# Patient Record
Sex: Male | Born: 1935 | Race: White | Hispanic: No | Smoking: Former smoker
Health system: Southern US, Community
[De-identification: ages and names within clinical notes are randomized; demographics above are authoritative.]

## PROBLEM LIST (undated history)

## (undated) DIAGNOSIS — E785 Hyperlipidemia, unspecified: Secondary | ICD-10-CM

## (undated) DIAGNOSIS — R0600 Dyspnea, unspecified: Secondary | ICD-10-CM

## (undated) DIAGNOSIS — I482 Chronic atrial fibrillation, unspecified: Secondary | ICD-10-CM

## (undated) DIAGNOSIS — C3490 Malignant neoplasm of unspecified part of unspecified bronchus or lung: Secondary | ICD-10-CM

## (undated) DIAGNOSIS — J449 Chronic obstructive pulmonary disease, unspecified: Secondary | ICD-10-CM

## (undated) DIAGNOSIS — I779 Disorder of arteries and arterioles, unspecified: Secondary | ICD-10-CM

## (undated) DIAGNOSIS — R0989 Other specified symptoms and signs involving the circulatory and respiratory systems: Secondary | ICD-10-CM

## (undated) DIAGNOSIS — I10 Essential (primary) hypertension: Secondary | ICD-10-CM

## (undated) DIAGNOSIS — I739 Peripheral vascular disease, unspecified: Secondary | ICD-10-CM

## (undated) DIAGNOSIS — M1990 Unspecified osteoarthritis, unspecified site: Secondary | ICD-10-CM

## (undated) DIAGNOSIS — N400 Enlarged prostate without lower urinary tract symptoms: Secondary | ICD-10-CM

## (undated) DIAGNOSIS — E119 Type 2 diabetes mellitus without complications: Secondary | ICD-10-CM

## (undated) DIAGNOSIS — G459 Transient cerebral ischemic attack, unspecified: Secondary | ICD-10-CM

## (undated) HISTORY — PX: SPINE SURGERY: SHX786

## (undated) HISTORY — DX: Benign prostatic hyperplasia without lower urinary tract symptoms: N40.0

## (undated) HISTORY — DX: Type 2 diabetes mellitus without complications: E11.9

## (undated) HISTORY — DX: Chronic atrial fibrillation, unspecified: I48.20

## (undated) HISTORY — DX: Essential (primary) hypertension: I10

## (undated) HISTORY — PX: CATARACT EXTRACTION: SUR2

## (undated) HISTORY — PX: BACK SURGERY: SHX140

## (undated) HISTORY — PX: TOTAL KNEE ARTHROPLASTY: SHX125

## (undated) HISTORY — DX: Disorder of arteries and arterioles, unspecified: I77.9

## (undated) HISTORY — PX: EYE SURGERY: SHX253

## (undated) HISTORY — DX: Hyperlipidemia, unspecified: E78.5

## (undated) HISTORY — PX: TONSILLECTOMY: SUR1361

## (undated) HISTORY — DX: Unspecified osteoarthritis, unspecified site: M19.90

## (undated) HISTORY — DX: Other specified symptoms and signs involving the circulatory and respiratory systems: R09.89

## (undated) HISTORY — PX: JOINT REPLACEMENT: SHX530

## (undated) HISTORY — PX: RETINAL DETACHMENT SURGERY: SHX105

## (undated) HISTORY — PX: NOSE SURGERY: SHX723

## (undated) HISTORY — DX: Malignant neoplasm of unspecified part of unspecified bronchus or lung: C34.90

## (undated) HISTORY — DX: Peripheral vascular disease, unspecified: I73.9

---

## 1968-07-20 HISTORY — PX: HEMORRHOID SURGERY: SHX153

## 2000-07-20 ENCOUNTER — Encounter: Payer: Self-pay | Admitting: Family Medicine

## 2001-02-10 ENCOUNTER — Encounter: Payer: Self-pay | Admitting: Family Medicine

## 2001-02-10 ENCOUNTER — Encounter: Admission: RE | Admit: 2001-02-10 | Discharge: 2001-02-10 | Payer: Self-pay | Admitting: Family Medicine

## 2001-02-21 ENCOUNTER — Encounter (INDEPENDENT_AMBULATORY_CARE_PROVIDER_SITE_OTHER): Payer: Self-pay | Admitting: *Deleted

## 2001-02-21 ENCOUNTER — Ambulatory Visit (HOSPITAL_COMMUNITY): Admission: RE | Admit: 2001-02-21 | Discharge: 2001-02-21 | Payer: Self-pay | Admitting: Pulmonary Disease

## 2001-02-28 ENCOUNTER — Ambulatory Visit (HOSPITAL_COMMUNITY): Admission: RE | Admit: 2001-02-28 | Discharge: 2001-02-28 | Payer: Self-pay | Admitting: Critical Care Medicine

## 2001-02-28 ENCOUNTER — Encounter: Payer: Self-pay | Admitting: Critical Care Medicine

## 2001-03-15 ENCOUNTER — Encounter (INDEPENDENT_AMBULATORY_CARE_PROVIDER_SITE_OTHER): Payer: Self-pay | Admitting: *Deleted

## 2001-03-15 ENCOUNTER — Inpatient Hospital Stay (HOSPITAL_COMMUNITY)
Admission: RE | Admit: 2001-03-15 | Discharge: 2001-03-19 | Payer: Self-pay | Admitting: Thoracic Surgery (Cardiothoracic Vascular Surgery)

## 2001-03-15 ENCOUNTER — Encounter: Payer: Self-pay | Admitting: Thoracic Surgery (Cardiothoracic Vascular Surgery)

## 2001-03-15 DIAGNOSIS — C3490 Malignant neoplasm of unspecified part of unspecified bronchus or lung: Secondary | ICD-10-CM

## 2001-03-15 HISTORY — PX: LOBECTOMY: SHX5089

## 2001-03-15 HISTORY — PX: WEDGE RESECTION: SHX5070

## 2001-03-16 ENCOUNTER — Encounter: Payer: Self-pay | Admitting: Thoracic Surgery (Cardiothoracic Vascular Surgery)

## 2001-03-17 ENCOUNTER — Encounter: Payer: Self-pay | Admitting: Thoracic Surgery (Cardiothoracic Vascular Surgery)

## 2001-03-18 ENCOUNTER — Encounter: Payer: Self-pay | Admitting: Thoracic Surgery (Cardiothoracic Vascular Surgery)

## 2001-03-30 ENCOUNTER — Encounter
Admission: RE | Admit: 2001-03-30 | Discharge: 2001-03-30 | Payer: Self-pay | Admitting: Thoracic Surgery (Cardiothoracic Vascular Surgery)

## 2001-03-30 ENCOUNTER — Encounter: Payer: Self-pay | Admitting: Thoracic Surgery (Cardiothoracic Vascular Surgery)

## 2001-05-02 ENCOUNTER — Ambulatory Visit (HOSPITAL_COMMUNITY): Admission: RE | Admit: 2001-05-02 | Discharge: 2001-05-02 | Payer: Self-pay | Admitting: Oncology

## 2001-05-02 ENCOUNTER — Encounter (HOSPITAL_COMMUNITY): Payer: Self-pay | Admitting: Oncology

## 2001-05-24 ENCOUNTER — Ambulatory Visit (HOSPITAL_COMMUNITY): Admission: RE | Admit: 2001-05-24 | Discharge: 2001-05-24 | Payer: Self-pay | Admitting: Oncology

## 2001-05-24 ENCOUNTER — Encounter (HOSPITAL_COMMUNITY): Payer: Self-pay | Admitting: Oncology

## 2001-06-29 ENCOUNTER — Encounter
Admission: RE | Admit: 2001-06-29 | Discharge: 2001-06-29 | Payer: Self-pay | Admitting: Thoracic Surgery (Cardiothoracic Vascular Surgery)

## 2001-06-29 ENCOUNTER — Encounter: Payer: Self-pay | Admitting: Thoracic Surgery (Cardiothoracic Vascular Surgery)

## 2001-08-20 ENCOUNTER — Encounter: Payer: Self-pay | Admitting: Family Medicine

## 2001-10-05 ENCOUNTER — Encounter
Admission: RE | Admit: 2001-10-05 | Discharge: 2001-10-05 | Payer: Self-pay | Admitting: Thoracic Surgery (Cardiothoracic Vascular Surgery)

## 2001-10-05 ENCOUNTER — Encounter: Payer: Self-pay | Admitting: Thoracic Surgery (Cardiothoracic Vascular Surgery)

## 2002-03-01 ENCOUNTER — Encounter (HOSPITAL_COMMUNITY): Payer: Self-pay | Admitting: Oncology

## 2002-03-01 ENCOUNTER — Ambulatory Visit (HOSPITAL_COMMUNITY): Admission: RE | Admit: 2002-03-01 | Discharge: 2002-03-01 | Payer: Self-pay | Admitting: Oncology

## 2002-08-20 ENCOUNTER — Encounter: Payer: Self-pay | Admitting: Family Medicine

## 2002-10-05 ENCOUNTER — Ambulatory Visit (HOSPITAL_COMMUNITY): Admission: RE | Admit: 2002-10-05 | Discharge: 2002-10-05 | Payer: Self-pay | Admitting: Oncology

## 2002-10-05 ENCOUNTER — Encounter (HOSPITAL_COMMUNITY): Payer: Self-pay | Admitting: Oncology

## 2003-02-20 ENCOUNTER — Encounter (HOSPITAL_COMMUNITY): Payer: Self-pay | Admitting: Oncology

## 2003-02-20 ENCOUNTER — Ambulatory Visit (HOSPITAL_COMMUNITY): Admission: RE | Admit: 2003-02-20 | Discharge: 2003-02-20 | Payer: Self-pay | Admitting: Oncology

## 2003-08-21 ENCOUNTER — Encounter: Payer: Self-pay | Admitting: Family Medicine

## 2003-08-21 LAB — CONVERTED CEMR LAB: PSA: 0.9 ng/mL

## 2003-10-15 ENCOUNTER — Ambulatory Visit (HOSPITAL_COMMUNITY): Admission: RE | Admit: 2003-10-15 | Discharge: 2003-10-15 | Payer: Self-pay | Admitting: Oncology

## 2004-02-18 ENCOUNTER — Encounter: Payer: Self-pay | Admitting: Family Medicine

## 2004-03-06 ENCOUNTER — Ambulatory Visit (HOSPITAL_COMMUNITY): Admission: RE | Admit: 2004-03-06 | Discharge: 2004-03-06 | Payer: Self-pay | Admitting: Oncology

## 2004-05-30 ENCOUNTER — Ambulatory Visit: Payer: Self-pay | Admitting: Family Medicine

## 2004-06-09 ENCOUNTER — Ambulatory Visit (HOSPITAL_COMMUNITY): Admission: RE | Admit: 2004-06-09 | Discharge: 2004-06-09 | Payer: Self-pay | Admitting: Oncology

## 2004-06-24 ENCOUNTER — Ambulatory Visit: Payer: Self-pay | Admitting: Family Medicine

## 2004-06-27 ENCOUNTER — Ambulatory Visit: Payer: Self-pay | Admitting: Oncology

## 2004-07-20 ENCOUNTER — Encounter: Payer: Self-pay | Admitting: Family Medicine

## 2004-07-20 LAB — CONVERTED CEMR LAB: PSA: 1.17 ng/mL

## 2004-08-05 ENCOUNTER — Ambulatory Visit: Payer: Self-pay | Admitting: Family Medicine

## 2004-08-19 ENCOUNTER — Ambulatory Visit: Payer: Self-pay | Admitting: Family Medicine

## 2004-08-20 ENCOUNTER — Encounter: Payer: Self-pay | Admitting: Family Medicine

## 2004-08-20 LAB — CONVERTED CEMR LAB: Hgb A1c MFr Bld: 6.5 %

## 2004-08-21 ENCOUNTER — Ambulatory Visit: Payer: Self-pay | Admitting: Family Medicine

## 2004-08-26 ENCOUNTER — Ambulatory Visit: Payer: Self-pay | Admitting: Family Medicine

## 2004-08-27 ENCOUNTER — Ambulatory Visit: Payer: Self-pay | Admitting: Oncology

## 2004-12-01 ENCOUNTER — Ambulatory Visit: Payer: Self-pay | Admitting: Family Medicine

## 2004-12-18 ENCOUNTER — Encounter: Payer: Self-pay | Admitting: Family Medicine

## 2005-01-07 ENCOUNTER — Ambulatory Visit: Payer: Self-pay | Admitting: Family Medicine

## 2005-01-14 ENCOUNTER — Ambulatory Visit: Payer: Self-pay | Admitting: Family Medicine

## 2005-02-16 ENCOUNTER — Ambulatory Visit: Payer: Self-pay | Admitting: Oncology

## 2005-02-19 ENCOUNTER — Ambulatory Visit (HOSPITAL_COMMUNITY): Admission: RE | Admit: 2005-02-19 | Discharge: 2005-02-19 | Payer: Self-pay | Admitting: Oncology

## 2005-04-09 ENCOUNTER — Ambulatory Visit: Payer: Self-pay | Admitting: Family Medicine

## 2005-04-14 ENCOUNTER — Ambulatory Visit: Payer: Self-pay | Admitting: Family Medicine

## 2005-05-15 ENCOUNTER — Ambulatory Visit: Payer: Self-pay | Admitting: Family Medicine

## 2005-06-19 ENCOUNTER — Encounter: Payer: Self-pay | Admitting: Family Medicine

## 2005-07-15 ENCOUNTER — Ambulatory Visit: Payer: Self-pay | Admitting: Family Medicine

## 2005-07-17 ENCOUNTER — Ambulatory Visit: Payer: Self-pay | Admitting: Family Medicine

## 2005-08-26 ENCOUNTER — Ambulatory Visit: Payer: Self-pay | Admitting: Oncology

## 2005-08-27 ENCOUNTER — Ambulatory Visit (HOSPITAL_COMMUNITY): Admission: RE | Admit: 2005-08-27 | Discharge: 2005-08-27 | Payer: Self-pay | Admitting: Oncology

## 2005-09-01 ENCOUNTER — Ambulatory Visit: Payer: Self-pay | Admitting: Gastroenterology

## 2005-09-16 ENCOUNTER — Ambulatory Visit: Payer: Self-pay | Admitting: Gastroenterology

## 2005-09-17 ENCOUNTER — Encounter: Payer: Self-pay | Admitting: Family Medicine

## 2005-09-17 LAB — CONVERTED CEMR LAB
Hgb A1c MFr Bld: 6 %
PSA: 1.43 ng/mL

## 2005-09-30 ENCOUNTER — Ambulatory Visit: Payer: Self-pay | Admitting: Family Medicine

## 2005-10-02 ENCOUNTER — Ambulatory Visit: Payer: Self-pay | Admitting: Family Medicine

## 2006-01-14 ENCOUNTER — Ambulatory Visit: Payer: Self-pay | Admitting: Family Medicine

## 2006-02-23 ENCOUNTER — Ambulatory Visit: Payer: Self-pay | Admitting: Internal Medicine

## 2006-03-05 ENCOUNTER — Ambulatory Visit: Payer: Self-pay | Admitting: Oncology

## 2006-03-05 ENCOUNTER — Observation Stay (HOSPITAL_COMMUNITY): Admission: RE | Admit: 2006-03-05 | Discharge: 2006-03-06 | Payer: Self-pay | Admitting: Neurosurgery

## 2006-03-11 LAB — COMPREHENSIVE METABOLIC PANEL
ALT: 27 U/L (ref 0–40)
AST: 16 U/L (ref 0–37)
Chloride: 101 mEq/L (ref 96–112)
Creatinine, Ser: 1 mg/dL (ref 0.40–1.50)
Sodium: 139 mEq/L (ref 135–145)
Total Bilirubin: 0.6 mg/dL (ref 0.3–1.2)
Total Protein: 6.2 g/dL (ref 6.0–8.3)

## 2006-03-11 LAB — CBC WITH DIFFERENTIAL/PLATELET
BASO%: 0.3 % (ref 0.0–2.0)
EOS%: 1.7 % (ref 0.0–7.0)
HCT: 42.1 % (ref 38.7–49.9)
LYMPH%: 22.4 % (ref 14.0–48.0)
MCH: 30 pg (ref 28.0–33.4)
MCHC: 33.7 g/dL (ref 32.0–35.9)
MONO#: 0.8 10*3/uL (ref 0.1–0.9)
NEUT%: 67.6 % (ref 40.0–75.0)
RBC: 4.73 10*6/uL (ref 4.20–5.71)
WBC: 10.2 10*3/uL — ABNORMAL HIGH (ref 4.0–10.0)
lymph#: 2.3 10*3/uL (ref 0.9–3.3)

## 2006-04-01 ENCOUNTER — Ambulatory Visit: Payer: Self-pay | Admitting: Family Medicine

## 2006-04-05 ENCOUNTER — Ambulatory Visit: Payer: Self-pay | Admitting: Family Medicine

## 2006-04-27 ENCOUNTER — Ambulatory Visit: Payer: Self-pay | Admitting: Family Medicine

## 2006-08-17 ENCOUNTER — Ambulatory Visit: Payer: Self-pay | Admitting: Family Medicine

## 2006-08-31 ENCOUNTER — Ambulatory Visit: Payer: Self-pay | Admitting: Family Medicine

## 2006-09-06 ENCOUNTER — Ambulatory Visit: Payer: Self-pay | Admitting: Oncology

## 2006-09-09 ENCOUNTER — Ambulatory Visit (HOSPITAL_COMMUNITY): Admission: RE | Admit: 2006-09-09 | Discharge: 2006-09-09 | Payer: Self-pay | Admitting: Oncology

## 2006-09-09 LAB — COMPREHENSIVE METABOLIC PANEL
ALT: 30 U/L (ref 0–53)
BUN: 15 mg/dL (ref 6–23)
CO2: 25 mEq/L (ref 19–32)
Creatinine, Ser: 0.96 mg/dL (ref 0.40–1.50)
Glucose, Bld: 152 mg/dL — ABNORMAL HIGH (ref 70–99)
Total Bilirubin: 0.6 mg/dL (ref 0.3–1.2)

## 2006-09-09 LAB — CBC WITH DIFFERENTIAL/PLATELET
Basophils Absolute: 0 10*3/uL (ref 0.0–0.1)
EOS%: 1.7 % (ref 0.0–7.0)
HCT: 44.2 % (ref 38.7–49.9)
HGB: 15.4 g/dL (ref 13.0–17.1)
LYMPH%: 26.6 % (ref 14.0–48.0)
MCH: 29.5 pg (ref 28.0–33.4)
MCHC: 34.8 g/dL (ref 32.0–35.9)
MCV: 84.9 fL (ref 81.6–98.0)
MONO%: 7.9 % (ref 0.0–13.0)
NEUT%: 63.3 % (ref 40.0–75.0)

## 2006-09-09 LAB — LACTATE DEHYDROGENASE: LDH: 118 U/L (ref 94–250)

## 2006-09-10 ENCOUNTER — Ambulatory Visit (HOSPITAL_COMMUNITY): Admission: RE | Admit: 2006-09-10 | Discharge: 2006-09-10 | Payer: Self-pay | Admitting: Oncology

## 2006-10-04 ENCOUNTER — Ambulatory Visit: Payer: Self-pay | Admitting: Family Medicine

## 2006-10-04 LAB — CONVERTED CEMR LAB
ALT: 32 units/L (ref 0–40)
Albumin: 3.9 g/dL (ref 3.5–5.2)
BUN: 13 mg/dL (ref 6–23)
Basophils Absolute: 0 10*3/uL (ref 0.0–0.1)
Chloride: 102 meq/L (ref 96–112)
Cholesterol: 160 mg/dL (ref 0–200)
Creatinine, Ser: 1 mg/dL (ref 0.4–1.5)
Eosinophils Absolute: 0.1 10*3/uL (ref 0.0–0.6)
Eosinophils Relative: 1.5 % (ref 0.0–5.0)
GFR calc non Af Amer: 78 mL/min
Glucose, Bld: 118 mg/dL — ABNORMAL HIGH (ref 70–99)
HCT: 44.6 % (ref 39.0–52.0)
Hemoglobin: 15 g/dL (ref 13.0–17.0)
Lymphocytes Relative: 23.3 % (ref 12.0–46.0)
MCV: 87.8 fL (ref 78.0–100.0)
Monocytes Absolute: 0.6 10*3/uL (ref 0.2–0.7)
Neutrophils Relative %: 67.1 % (ref 43.0–77.0)
Platelets: 228 10*3/uL (ref 150–400)
Potassium: 4.5 meq/L (ref 3.5–5.1)
Sodium: 139 meq/L (ref 135–145)
Triglycerides: 138 mg/dL (ref 0–149)

## 2006-10-07 ENCOUNTER — Ambulatory Visit: Payer: Self-pay | Admitting: Family Medicine

## 2006-10-14 ENCOUNTER — Ambulatory Visit: Payer: Self-pay | Admitting: Family Medicine

## 2007-03-06 ENCOUNTER — Ambulatory Visit: Payer: Self-pay | Admitting: Oncology

## 2007-03-10 ENCOUNTER — Encounter: Payer: Self-pay | Admitting: Family Medicine

## 2007-03-10 LAB — CBC WITH DIFFERENTIAL/PLATELET
Basophils Absolute: 0 10*3/uL (ref 0.0–0.1)
Eosinophils Absolute: 0.2 10*3/uL (ref 0.0–0.5)
HCT: 42.3 % (ref 38.7–49.9)
HGB: 14.7 g/dL (ref 13.0–17.1)
MONO#: 0.7 10*3/uL (ref 0.1–0.9)
NEUT#: 3.5 10*3/uL (ref 1.5–6.5)
NEUT%: 54.5 % (ref 40.0–75.0)
RDW: 13.6 % (ref 11.2–14.6)
WBC: 6.5 10*3/uL (ref 4.0–10.0)
lymph#: 2 10*3/uL (ref 0.9–3.3)

## 2007-03-10 LAB — COMPREHENSIVE METABOLIC PANEL
Albumin: 4.2 g/dL (ref 3.5–5.2)
BUN: 15 mg/dL (ref 6–23)
CO2: 22 mEq/L (ref 19–32)
Calcium: 9.1 mg/dL (ref 8.4–10.5)
Chloride: 106 mEq/L (ref 96–112)
Creatinine, Ser: 0.8 mg/dL (ref 0.40–1.50)
Potassium: 4.4 mEq/L (ref 3.5–5.3)

## 2007-03-10 LAB — LACTATE DEHYDROGENASE: LDH: 138 U/L (ref 94–250)

## 2007-03-17 ENCOUNTER — Encounter: Payer: Self-pay | Admitting: Family Medicine

## 2007-03-17 DIAGNOSIS — N400 Enlarged prostate without lower urinary tract symptoms: Secondary | ICD-10-CM

## 2007-03-17 DIAGNOSIS — E1139 Type 2 diabetes mellitus with other diabetic ophthalmic complication: Secondary | ICD-10-CM | POA: Insufficient documentation

## 2007-03-17 DIAGNOSIS — K649 Unspecified hemorrhoids: Secondary | ICD-10-CM | POA: Insufficient documentation

## 2007-03-17 DIAGNOSIS — I10 Essential (primary) hypertension: Secondary | ICD-10-CM | POA: Insufficient documentation

## 2007-03-17 DIAGNOSIS — M1710 Unilateral primary osteoarthritis, unspecified knee: Secondary | ICD-10-CM

## 2007-03-29 ENCOUNTER — Ambulatory Visit: Payer: Self-pay | Admitting: Family Medicine

## 2007-03-29 LAB — CONVERTED CEMR LAB: Hgb A1c MFr Bld: 6.6 % — ABNORMAL HIGH (ref 4.6–6.0)

## 2007-03-31 ENCOUNTER — Ambulatory Visit: Payer: Self-pay | Admitting: Family Medicine

## 2007-04-20 ENCOUNTER — Ambulatory Visit: Payer: Self-pay | Admitting: Family Medicine

## 2007-09-08 ENCOUNTER — Ambulatory Visit: Payer: Self-pay | Admitting: Oncology

## 2007-09-12 ENCOUNTER — Ambulatory Visit (HOSPITAL_COMMUNITY): Admission: RE | Admit: 2007-09-12 | Discharge: 2007-09-12 | Payer: Self-pay | Admitting: Oncology

## 2007-09-12 ENCOUNTER — Encounter: Payer: Self-pay | Admitting: Family Medicine

## 2007-09-12 LAB — CBC WITH DIFFERENTIAL/PLATELET
Eosinophils Absolute: 0.1 10*3/uL (ref 0.0–0.5)
HCT: 43.7 % (ref 38.7–49.9)
HGB: 15.1 g/dL (ref 13.0–17.1)
LYMPH%: 24.4 % (ref 14.0–48.0)
MONO#: 0.5 10*3/uL (ref 0.1–0.9)
NEUT#: 4.1 10*3/uL (ref 1.5–6.5)
NEUT%: 65.2 % (ref 40.0–75.0)
Platelets: 207 10*3/uL (ref 145–400)
WBC: 6.3 10*3/uL (ref 4.0–10.0)
lymph#: 1.5 10*3/uL (ref 0.9–3.3)

## 2007-09-12 LAB — COMPREHENSIVE METABOLIC PANEL
ALT: 53 U/L (ref 0–53)
Albumin: 4.4 g/dL (ref 3.5–5.2)
CO2: 25 mEq/L (ref 19–32)
Chloride: 105 mEq/L (ref 96–112)
Glucose, Bld: 185 mg/dL — ABNORMAL HIGH (ref 70–99)
Potassium: 4.2 mEq/L (ref 3.5–5.3)
Sodium: 142 mEq/L (ref 135–145)
Total Bilirubin: 0.4 mg/dL (ref 0.3–1.2)
Total Protein: 6.8 g/dL (ref 6.0–8.3)

## 2007-09-12 LAB — LACTATE DEHYDROGENASE: LDH: 140 U/L (ref 94–250)

## 2007-10-07 ENCOUNTER — Ambulatory Visit: Payer: Self-pay | Admitting: Family Medicine

## 2007-10-07 LAB — CONVERTED CEMR LAB
ALT: 54 units/L — ABNORMAL HIGH (ref 0–53)
AST: 37 units/L (ref 0–37)
Albumin: 3.9 g/dL (ref 3.5–5.2)
Calcium: 9.6 mg/dL (ref 8.4–10.5)
Chloride: 104 meq/L (ref 96–112)
Creatinine, Ser: 1.1 mg/dL (ref 0.4–1.5)
Eosinophils Absolute: 0.1 10*3/uL (ref 0.0–0.6)
Eosinophils Relative: 2 % (ref 0.0–5.0)
GFR calc Af Amer: 85 mL/min
Glucose, Bld: 126 mg/dL — ABNORMAL HIGH (ref 70–99)
HDL: 46.2 mg/dL (ref >39.0)
Lymphocytes Relative: 22.9 % (ref 12.0–46.0)
MCHC: 32.1 g/dL (ref 30.0–36.0)
MCV: 89.9 fL (ref 78.0–100.0)
Monocytes Absolute: 0.5 10*3/uL (ref 0.2–0.7)
Neutro Abs: 4.1 10*3/uL (ref 1.4–7.7)
PSA: 0.56 ng/mL (ref 0.10–4.00)
Potassium: 4.5 meq/L (ref 3.5–5.1)
RDW: 12.8 % (ref 11.5–14.6)
TSH: 1.09 microintl units/mL (ref 0.35–5.50)
Total CHOL/HDL Ratio: 3.3
Total Protein: 6.4 g/dL (ref 6.0–8.3)
VLDL: 16 mg/dL (ref 0–40)
WBC: 6.1 10*3/uL (ref 4.5–10.5)

## 2007-10-11 ENCOUNTER — Ambulatory Visit: Payer: Self-pay | Admitting: Family Medicine

## 2007-10-21 ENCOUNTER — Ambulatory Visit: Payer: Self-pay | Admitting: Family Medicine

## 2007-10-21 LAB — FECAL OCCULT BLOOD, GUAIAC: Fecal Occult Blood: NEGATIVE

## 2007-10-21 LAB — CONVERTED CEMR LAB
OCCULT 1: NEGATIVE
OCCULT 2: NEGATIVE
OCCULT 3: NEGATIVE

## 2007-10-24 ENCOUNTER — Encounter (INDEPENDENT_AMBULATORY_CARE_PROVIDER_SITE_OTHER): Payer: Self-pay | Admitting: *Deleted

## 2008-01-17 ENCOUNTER — Encounter: Payer: Self-pay | Admitting: Family Medicine

## 2008-02-14 ENCOUNTER — Encounter: Payer: Self-pay | Admitting: Family Medicine

## 2008-02-23 ENCOUNTER — Ambulatory Visit: Payer: Self-pay | Admitting: Family Medicine

## 2008-02-28 ENCOUNTER — Ambulatory Visit: Payer: Self-pay | Admitting: Cardiovascular Disease

## 2008-02-29 ENCOUNTER — Ambulatory Visit: Payer: Self-pay

## 2008-02-29 ENCOUNTER — Encounter: Payer: Self-pay | Admitting: Cardiovascular Disease

## 2008-03-02 ENCOUNTER — Inpatient Hospital Stay (HOSPITAL_COMMUNITY): Admission: RE | Admit: 2008-03-02 | Discharge: 2008-03-05 | Payer: Self-pay | Admitting: Orthopaedic Surgery

## 2008-04-06 ENCOUNTER — Ambulatory Visit: Payer: Self-pay | Admitting: Family Medicine

## 2008-04-06 LAB — CONVERTED CEMR LAB: Hgb A1c MFr Bld: 6.3 % — ABNORMAL HIGH (ref 4.6–6.0)

## 2008-04-09 ENCOUNTER — Ambulatory Visit: Payer: Self-pay | Admitting: Family Medicine

## 2008-05-10 ENCOUNTER — Ambulatory Visit: Payer: Self-pay | Admitting: Oncology

## 2008-05-14 ENCOUNTER — Encounter: Payer: Self-pay | Admitting: Family Medicine

## 2008-05-14 LAB — CBC WITH DIFFERENTIAL/PLATELET
Basophils Absolute: 0.1 10*3/uL (ref 0.0–0.1)
Eosinophils Absolute: 0.1 10*3/uL (ref 0.0–0.5)
HCT: 42.2 % (ref 38.7–49.9)
LYMPH%: 26.5 % (ref 14.0–48.0)
MCV: 85 fL (ref 81.6–98.0)
MONO%: 6.9 % (ref 0.0–13.0)
NEUT#: 5.4 10*3/uL (ref 1.5–6.5)
NEUT%: 64.4 % (ref 40.0–75.0)
Platelets: 245 10*3/uL (ref 145–400)
RBC: 4.97 10*6/uL (ref 4.20–5.71)

## 2008-05-14 LAB — COMPREHENSIVE METABOLIC PANEL
Alkaline Phosphatase: 52 U/L (ref 39–117)
BUN: 15 mg/dL (ref 6–23)
Creatinine, Ser: 1.01 mg/dL (ref 0.40–1.50)
Glucose, Bld: 127 mg/dL — ABNORMAL HIGH (ref 70–99)
Sodium: 138 mEq/L (ref 135–145)
Total Bilirubin: 0.5 mg/dL (ref 0.3–1.2)

## 2008-10-15 ENCOUNTER — Ambulatory Visit: Payer: Self-pay | Admitting: Family Medicine

## 2008-10-15 LAB — CONVERTED CEMR LAB
ALT: 26 units/L (ref 0–53)
AST: 24 units/L (ref 0–37)
Alkaline Phosphatase: 42 units/L (ref 39–117)
BUN: 14 mg/dL (ref 6–23)
Basophils Relative: 0.5 % (ref 0.0–3.0)
Bilirubin, Direct: 0.1 mg/dL (ref 0.0–0.3)
CO2: 31 meq/L (ref 19–32)
Calcium: 9.5 mg/dL (ref 8.4–10.5)
Chloride: 108 meq/L (ref 96–112)
Creatinine, Ser: 1 mg/dL (ref 0.4–1.5)
Creatinine,U: 102.1 mg/dL
Glucose, Bld: 120 mg/dL — ABNORMAL HIGH (ref 70–99)
LDL Cholesterol: 92 mg/dL (ref 0–99)
MCHC: 34.1 g/dL (ref 30.0–36.0)
MCV: 87.5 fL (ref 78.0–100.0)
Microalb, Ur: 0.6 mg/dL (ref 0.0–1.9)
Monocytes Relative: 9.2 % (ref 3.0–12.0)
Neutro Abs: 4.6 10*3/uL (ref 1.4–7.7)
PSA: 0.44 ng/mL (ref 0.10–4.00)
Platelets: 198 10*3/uL (ref 150.0–400.0)
RDW: 13.9 % (ref 11.5–14.6)
Sodium: 142 meq/L (ref 135–145)
Total Protein: 6.8 g/dL (ref 6.0–8.3)
VLDL: 14.2 mg/dL (ref 0.0–40.0)

## 2008-10-18 ENCOUNTER — Ambulatory Visit: Payer: Self-pay | Admitting: Family Medicine

## 2008-10-18 DIAGNOSIS — D485 Neoplasm of uncertain behavior of skin: Secondary | ICD-10-CM

## 2008-11-08 ENCOUNTER — Ambulatory Visit: Payer: Self-pay | Admitting: Oncology

## 2008-11-12 ENCOUNTER — Ambulatory Visit (HOSPITAL_COMMUNITY): Admission: RE | Admit: 2008-11-12 | Discharge: 2008-11-12 | Payer: Self-pay | Admitting: Oncology

## 2008-11-12 ENCOUNTER — Encounter: Payer: Self-pay | Admitting: Family Medicine

## 2008-11-12 LAB — CBC WITH DIFFERENTIAL/PLATELET
Basophils Absolute: 0 10*3/uL (ref 0.0–0.1)
EOS%: 1.6 % (ref 0.0–7.0)
Eosinophils Absolute: 0.1 10*3/uL (ref 0.0–0.5)
HGB: 14.8 g/dL (ref 13.0–17.1)
LYMPH%: 26.9 % (ref 14.0–49.0)
MCH: 29.3 pg (ref 27.2–33.4)
MCV: 87.3 fL (ref 79.3–98.0)
MONO%: 9.3 % (ref 0.0–14.0)
NEUT#: 4.5 10*3/uL (ref 1.5–6.5)
Platelets: 184 10*3/uL (ref 140–400)
RBC: 5.04 10*6/uL (ref 4.20–5.82)
RDW: 14 % (ref 11.0–14.6)

## 2008-11-12 LAB — COMPREHENSIVE METABOLIC PANEL
AST: 21 U/L (ref 0–37)
Alkaline Phosphatase: 43 U/L (ref 39–117)
BUN: 16 mg/dL (ref 6–23)
Glucose, Bld: 176 mg/dL — ABNORMAL HIGH (ref 70–99)
Potassium: 4.2 mEq/L (ref 3.5–5.3)
Total Bilirubin: 0.4 mg/dL (ref 0.3–1.2)

## 2009-01-22 ENCOUNTER — Encounter: Payer: Self-pay | Admitting: Family Medicine

## 2009-01-22 ENCOUNTER — Ambulatory Visit: Payer: Self-pay | Admitting: Family Medicine

## 2009-02-06 ENCOUNTER — Encounter (INDEPENDENT_AMBULATORY_CARE_PROVIDER_SITE_OTHER): Payer: Self-pay | Admitting: *Deleted

## 2009-04-02 ENCOUNTER — Inpatient Hospital Stay (HOSPITAL_COMMUNITY): Admission: RE | Admit: 2009-04-02 | Discharge: 2009-04-06 | Payer: Self-pay | Admitting: Orthopaedic Surgery

## 2009-04-30 ENCOUNTER — Ambulatory Visit: Payer: Self-pay | Admitting: Family Medicine

## 2009-05-06 ENCOUNTER — Ambulatory Visit: Payer: Self-pay | Admitting: Family Medicine

## 2009-05-06 LAB — CONVERTED CEMR LAB
Bilirubin Urine: NEGATIVE
Glucose, Urine, Semiquant: NEGATIVE
Ketones, urine, test strip: NEGATIVE
Nitrite: NEGATIVE
Protein, U semiquant: 100
Urobilinogen, UA: 0.2
pH: 6

## 2009-05-07 ENCOUNTER — Encounter: Payer: Self-pay | Admitting: Family Medicine

## 2009-07-01 ENCOUNTER — Ambulatory Visit: Payer: Self-pay | Admitting: Family Medicine

## 2009-07-01 DIAGNOSIS — G2581 Restless legs syndrome: Secondary | ICD-10-CM | POA: Insufficient documentation

## 2009-07-02 ENCOUNTER — Encounter: Admission: RE | Admit: 2009-07-02 | Discharge: 2009-07-02 | Payer: Self-pay | Admitting: Orthopaedic Surgery

## 2009-07-25 ENCOUNTER — Encounter: Payer: Self-pay | Admitting: Family Medicine

## 2009-10-24 ENCOUNTER — Ambulatory Visit: Payer: Self-pay | Admitting: Family Medicine

## 2009-10-24 LAB — CONVERTED CEMR LAB
ALT: 33 units/L (ref 0–53)
Basophils Relative: 0.7 % (ref 0.0–3.0)
CO2: 32 meq/L (ref 19–32)
Chloride: 106 meq/L (ref 96–112)
Creatinine, Ser: 0.9 mg/dL (ref 0.4–1.5)
Eosinophils Absolute: 0.2 10*3/uL (ref 0.0–0.7)
Eosinophils Relative: 2.4 % (ref 0.0–5.0)
Glucose, Bld: 120 mg/dL — ABNORMAL HIGH (ref 70–99)
HCT: 43.9 % (ref 39.0–52.0)
Hemoglobin: 14.9 g/dL (ref 13.0–17.0)
Hgb A1c MFr Bld: 6.7 % — ABNORMAL HIGH (ref 4.6–6.5)
LDL Cholesterol: 83 mg/dL (ref 0–99)
Lymphocytes Relative: 25.7 % (ref 12.0–46.0)
Lymphs Abs: 1.9 10*3/uL (ref 0.7–4.0)
MCHC: 33.9 g/dL (ref 30.0–36.0)
MCV: 84.6 fL (ref 78.0–100.0)
Microalb, Ur: 0.5 mg/dL (ref 0.0–1.9)
Monocytes Relative: 9.7 % (ref 3.0–12.0)
Neutro Abs: 4.6 10*3/uL (ref 1.4–7.7)
Potassium: 4.9 meq/L (ref 3.5–5.1)
RBC: 5.19 M/uL (ref 4.22–5.81)
TSH: 1.32 microintl units/mL (ref 0.35–5.50)
Vitamin B-12: 442 pg/mL (ref 211–911)

## 2009-10-27 LAB — CONVERTED CEMR LAB: Vit D, 25-Hydroxy: 34 ng/mL (ref 30–89)

## 2009-10-31 ENCOUNTER — Ambulatory Visit: Payer: Self-pay | Admitting: Family Medicine

## 2009-10-31 DIAGNOSIS — N529 Male erectile dysfunction, unspecified: Secondary | ICD-10-CM

## 2009-10-31 LAB — HM DIABETES FOOT EXAM

## 2009-11-11 ENCOUNTER — Ambulatory Visit (HOSPITAL_BASED_OUTPATIENT_CLINIC_OR_DEPARTMENT_OTHER): Payer: Medicare Other | Admitting: Oncology

## 2009-11-12 ENCOUNTER — Encounter: Payer: Self-pay | Admitting: Family Medicine

## 2009-11-12 ENCOUNTER — Ambulatory Visit (HOSPITAL_COMMUNITY): Admission: RE | Admit: 2009-11-12 | Discharge: 2009-11-12 | Payer: Self-pay | Admitting: Oncology

## 2009-11-12 LAB — COMPREHENSIVE METABOLIC PANEL
AST: 22 U/L (ref 0–37)
Albumin: 4.1 g/dL (ref 3.5–5.2)
BUN: 17 mg/dL (ref 6–23)
Calcium: 9.5 mg/dL (ref 8.4–10.5)
Creatinine, Ser: 1 mg/dL (ref 0.40–1.50)
Total Protein: 6.4 g/dL (ref 6.0–8.3)

## 2009-11-12 LAB — CBC WITH DIFFERENTIAL/PLATELET
EOS%: 1.5 % (ref 0.0–7.0)
Eosinophils Absolute: 0.1 10*3/uL (ref 0.0–0.5)
HCT: 42.2 % (ref 38.4–49.9)
HGB: 14.2 g/dL (ref 13.0–17.1)
MONO#: 0.8 10*3/uL (ref 0.1–0.9)
MONO%: 11.4 % (ref 0.0–14.0)
NEUT#: 4.2 10*3/uL (ref 1.5–6.5)
NEUT%: 60.7 % (ref 39.0–75.0)
Platelets: 235 10*3/uL (ref 140–400)
RDW: 15.2 % — ABNORMAL HIGH (ref 11.0–14.6)
WBC: 7 10*3/uL (ref 4.0–10.3)
lymph#: 1.8 10*3/uL (ref 0.9–3.3)

## 2009-11-12 LAB — LACTATE DEHYDROGENASE: LDH: 155 U/L (ref 94–250)

## 2010-01-01 ENCOUNTER — Ambulatory Visit: Payer: Self-pay | Admitting: Family Medicine

## 2010-01-01 DIAGNOSIS — J4489 Other specified chronic obstructive pulmonary disease: Secondary | ICD-10-CM | POA: Insufficient documentation

## 2010-01-01 DIAGNOSIS — J449 Chronic obstructive pulmonary disease, unspecified: Secondary | ICD-10-CM

## 2010-01-02 ENCOUNTER — Telehealth: Payer: Self-pay | Admitting: Family Medicine

## 2010-01-02 ENCOUNTER — Encounter: Payer: Self-pay | Admitting: Family Medicine

## 2010-01-15 ENCOUNTER — Encounter: Payer: Self-pay | Admitting: Family Medicine

## 2010-02-25 ENCOUNTER — Encounter (INDEPENDENT_AMBULATORY_CARE_PROVIDER_SITE_OTHER): Payer: Self-pay | Admitting: *Deleted

## 2010-04-08 ENCOUNTER — Ambulatory Visit: Payer: Self-pay | Admitting: Family Medicine

## 2010-04-23 ENCOUNTER — Ambulatory Visit: Payer: Self-pay | Admitting: Family Medicine

## 2010-05-22 ENCOUNTER — Ambulatory Visit: Payer: Self-pay | Admitting: Family Medicine

## 2010-07-03 ENCOUNTER — Ambulatory Visit: Payer: Self-pay | Admitting: Family Medicine

## 2010-08-10 ENCOUNTER — Encounter: Payer: Self-pay | Admitting: Orthopaedic Surgery

## 2010-08-21 NOTE — Medication Information (Signed)
Summary: [facility] Zostavax  [facility]   Imported By: Beau Fanny 01/02/2010 13:13:03  _____________________________________________________________________  External Attachment:    Type:   Image     Comment:   External Document

## 2010-08-21 NOTE — Assessment & Plan Note (Signed)
Summary: cpx/bir   Vital Signs:  Patient profile:   77 year old male Height:      73.5 inches Weight:      241.50 pounds BMI:     31.54 Temp:     98.1 degrees F oral Pulse rate:   64 / minute Pulse rhythm:   regular BP sitting:   170 / 88  (left arm) Cuff size:   large  Vitals Entered By: Sydell Axon LPN (October 31, 2009 8:11 AM) CC: 30 Minute checkup, had a colonoscopy 09/16/05   History of Present Illness: Pt here for Comp Exam. His jumpy legs were better last night after taking Methocarbamol and he has episodes only once a week or less. He is satisfied with curr trmt. BP here today higher than prviously. His wife takes it at home twice a month and is 130s/70-80s. Has been high in other offices. He has trouble with erections. His friend was given Levitra for ED and he would like some also...he has trouble keeping an erection.  Preventive Screening-Counseling & Management  Alcohol-Tobacco     Alcohol drinks/day: 0     Alcohol type: wine occas     Smoking Status: quit     Year Started: 1952     Year Quit: 1984     Pack years: 40     Passive Smoke Exposure: no  Caffeine-Diet-Exercise     Caffeine use/day: 1     Does Patient Exercise: yes     Type of exercise: gym      Exercise (avg: min/session): >60,     Times/week: 5  Problems Prior to Update: 1)  Restless Leg Syndrome  (ICD-333.94) 2)  Neoplasm of Uncertain Behavior of Skin  (ICD-238.2) 3)  Special Screening Malig Neoplasms Other Sites  (ICD-V76.49) 4)  Health Maintenance Exam  (ICD-V70.0) 5)  Disorder, Conjunctival Nos Tear/ Right  (ICD-372.9) 6)  Degenerative Joint Disease, Knees, Bilateral  (ICD-715.96) 7)  Benign Prostatic Hypertrophy  (ICD-600.00) 8)  Diabetes Mellitus, Type II  (ICD-250.00) 9)  Adenocarcinoma, Lung, 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 Take 1 Tablet By Mouth Once A Day 2)  Fish Oil 1000 Mg Caps (Omega-3 Fatty Acids) .... Take 1 Capsule By Mouth 3times A Day 3)  Bayer Aspirin 325 Mg Tabs (Aspirin) .... Take 1 Tablet By Mouth Once A Day 4)  Glucosamine-Chondroitin 500-400 Mg Caps (Glucosamine-Chondroitin) .... Take 1 Capsule By Mouth Three Times A Day 5)  Altace 10 Mg Caps (Ramipril) .... Take 1 Capsule By Mouth Once A Day 6)  Vytorin 10-80 Mg Tabs (Ezetimibe-Simvastatin) .... Take One By Mouth Once A Day 7)  Centrum   Tabs (Multiple Vitamins-Minerals) .... Take One By Mouth Once A Day 8)  Avodart 0.5 Mg  Caps (Dutasteride) .... Take One By Mouth Once A Day 9)  Metoprolol Succinate 25 Mg Xr24h-Tab (Metoprolol Succinate) .... One Tab By Mouth At Night 10)  Clobetasol Propionate 0.05 % Oint (Clobetasol Propionate) .... Apply To Affcted Area Two Times A Day As Needed 11)  Methocarbamol 500 Mg Tabs (Methocarbamol) .... Take One By Mouth Every 6-8 Hours As Needed 12)  Oxycodone-Acetaminophen 5-500 Mg Caps (Oxycodone-Acetaminophen) .... Take One By Mouth Every 4-6 Hours As Needed  Allergies: 1)  Amoxicillin (Amoxicillin)  Past History:  Past Medical History: Last updated:  03/17/2007 Hyperlipidemia Hypertension Diabetes mellitus, type II Benign prostatic hypertrophy  Past Surgical History: Last updated: 03/05/2008 Hemmorroidectomy 1970 R Catarract Repair,detached retina Septoplasty Right cataract removed/ detached retina Wedge resection LUL, LUL lobectomy,  [place]  03/15/01 CT Scan Chest - stable 08/03 CT Chest with contrast- stable 02/19/05 Colonoscopy wnl 09/16/05 R L4/5 secondary herniated disc 8/06 ECHO LVF Nml EF 60% Mild AV&MV Calcif  MIld LAE 02/29/08  Family History: Last updated: 10/31/2009 Father: dec 76 of emphysema Mother: dec 92 of natural causes,  dementia, diabetes, Htn Brother A 39  memory problems Sister A  72  thyroid problems CV - none HBP +  Self, mother DM + Mother, nephew Prostate cancer - none Lung cancer + Self Breast/Ovarian.Uterine Cancer - none Colon Cancer - none Depression - none ETOH/Drug abuse - none Stroke - none  Social History: Last updated: 03/17/2007 Marital Status: Married Children: 3 Occupation: Was fired from [employer] and then retired as a result  Risk Factors: Alcohol Use: 0 (10/31/2009) Caffeine Use: 1 (10/31/2009) Exercise: yes (10/31/2009)  Risk Factors: Smoking Status: quit (10/31/2009) Passive Smoke Exposure: no (10/31/2009)  Family History: Father: dec 76 of emphysema Mother: dec 92 of natural causes,  dementia, diabetes, Htn Brother A 20  memory problems Sister A  72  thyroid problems CV - none HBP + Self, mother DM + Mother, nephew Prostate cancer - none Lung cancer + Self Breast/Ovarian.Uterine Cancer - none Colon Cancer - none Depression - none ETOH/Drug abuse - none Stroke - none  Review of Systems General:  Denies chills, fatigue, fever, sweats, weakness, and weight loss. Eyes:  Denies blurring and discharge; had episode at [employer] of focus for about 10 mins. . ENT:  Denies decreased hearing, ear discharge, earache, and ringing in ears. CV:  Complains of shortness of breath with exertion; denies chest pain or discomfort, fainting, fatigue, palpitations, swelling of feet, and swelling of hands. Resp:  Denies cough, shortness of breath, and wheezing. GI:  Denies abdominal pain, bloody stools, change in bowel habits, constipation, dark tarry stools, diarrhea, indigestion, loss of appetite, nausea, vomiting, vomiting blood, and yellowish skin color. GU:  Complains of erectile dysfunction; denies discharge, dysuria, nocturia, and urinary frequency. MS:  Denies joint pain, joint redness, low back pain, muscle aches, cramps, muscle weakness, and stiffness. Derm:  Denies dryness, itching, and rash. Neuro:  Denies numbness, poor balance, tingling, and  tremors.  Physical Exam  General:  Well-developed,well-nourished,in no acute distress; alert,appropriate and cooperative throughout examination. Nontoxic appearing. Head:  Normocephalic and atraumatic without obvious abnormalities. No apparent alopecia, has mild male pattern  balding. Sinuses NT. Eyes:  Conjunctiva clear bilaterally.  Ears:  External ear exam shows no significant lesions or deformities.  Otoscopic examination reveals clear canals, tympanic membranes are intact bilaterally without bulging, retraction, inflammation or discharge. Hearing is grossly normal bilaterally. Cerumen bilat. Nose:  External nasal examination shows no deformity or inflammation. Nasal mucosa are pink and moist without lesions or exudates. Mouth:  Oral mucosa and oropharynx without lesions or exudates.  Teeth in good repair. Neck:  No deformities, masses, or tenderness noted. Chest Wall:  No deformities, masses, tenderness or gynecomastia noted. Breasts:  No masses or gynecomastia noted Lungs:  Normal respiratory effort, chest expands symmetrically. Lungs are clear to auscultation, no crackles or wheezes. Heart:  Normal rate and regular rhythm. S1 and S2 normal without gallop, murmur, click, rub or other extra sounds. Abdomen:  No suprapubic tenderness. Rectal:  No external abnormalities noted.  Normal sphincter tone. No rectal masses or tenderness. G neg. Hemms surgically removed with mild prominence. Genitalia:  Testes bilaterally descended without nodularity, tenderness or masses. No scrotal masses or lesions. No penis lesions or urethral discharge. Prostate:  Prostate gland firm and smooth, no enlargement, nodularity, tenderness, mass, asymmetry or induration. 10gms. Msk:  No deformity or scoliosis noted of thoracic or lumbar spine.   Pulses:  R and L posterior tibial pulses are full and equal bilaterally  Extremities:  no edema Neurologic:  No cranial nerve deficits noted. Station and gait are normal.  DTRs are symmetrical throughout. Sensory, motor and coordinative functions appear intact. Skin:  Intact without suspicious lesions or rashes, echymoses of forearms bilat. Cervical Nodes:  No lymphadenopathy noted Inguinal Nodes:  No significant adenopathy Psych:  Cognition and judgment appear intact. Alert and cooperative with normal attention span and concentration. No apparent delusions, illusions, hallucinations  Diabetes Management Exam:    Foot Exam (with socks and/or shoes not present):       Sensory-Pinprick/Light touch:          Left medial foot (L-4): normal          Left dorsal foot (L-5): normal          Left lateral foot (S-1): normal          Right medial foot (L-4): normal          Right dorsal foot (L-5): normal          Right lateral foot (S-1): normal       Sensory-Monofilament:          Left foot: normal          Right foot: normal       Inspection:          Left foot: normal          Right foot: normal       Nails:          Left foot: thickened, some          Right foot: thickened, some   Impression & Recommendations:  Problem # 1:  HEALTH MAINTENANCE EXAM (ICD-V70.0) Assessment Comment Only  Discussed Zostavax. Patient's general appearance, affect, speech and motor skills appear essentially normal.   Reviewed preventive care protocols, scheduled due services, and updated immunizations.  Problem # 2:  HYPERTENSION (ICD-401.9) Increase Metoprolol to 11/2 tabs at night Recheck 2 mos. His updated medication list for this problem includes:    Altace 10 Mg Caps (Ramipril) .Marland Kitchen... Take 1 capsule by mouth once a day    Metoprolol Succinate 25 Mg Xr24h-tab (Metoprolol succinate) .Marland Kitchen... 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 Will try Levitra. His updated medication list for this problem includes:    Levitra 20 Mg Tabs (Vardenafil hcl) ..... One tab by mouth one hour prior to desired  activity  Problem # 4:  RESTLESS LEG SYNDROME (ICD-333.94) Assessment: Improved Stable on musc relaxer, cont Methocarbamol as needed.  Problem # 5:  DEGENERATIVE JOINT DISEASE, KNEES, BILATERAL (ICD-715.96) Assessment: Unchanged Saw Ortho and then PT...doing exercises on his own at the gym. His updated medication list for this problem includes:    Bayer Aspirin 325 Mg Tabs (Aspirin) .Marland Kitchen... Take 1 tablet by mouth once a day    Methocarbamol 500 Mg Tabs (Methocarbamol) .Marland Kitchen... Take one by mouth every 6-8 hours as needed    Oxycodone-acetaminophen 5-500 Mg Caps (Oxycodone-acetaminophen) .Marland KitchenMarland KitchenMarland KitchenMarland Kitchen  Take one by mouth every 4-6 hours as needed  Problem # 6:  BENIGN PROSTATIC HYPERTROPHY (ICD-600.00) Assessment: Unchanged Stable PSA and  exam nml.  Problem # 7:  DIABETES MELLITUS, TYPE II (ICD-250.00) Assessment: Unchanged Stable. Cont efforts at low carb. His updated medication list for this problem includes:    Bayer Aspirin 325 Mg Tabs (Aspirin) .Marland Kitchen... Take 1 tablet by mouth once a day    Altace 10 Mg Caps (Ramipril) .Marland Kitchen... Take 1 capsule by mouth once a day  Labs Reviewed: Creat: 0.9 (10/24/2009)   Microalbumin: 6.3 (09/30/2005) Reviewed HgBA1c results: 6.7 (10/24/2009)  6.5 (10/15/2008)  Problem # 8:  ADENOCARCINOMA, LUNG, S/P PARTIAL LOBECTOMY LUL (ICD-162.9) Assessment: Unchanged Wheezing today. Will doe PFTs next time. May need Pulm referral. Get CXR.  Problem # 9:  HYPERLIPIDEMIA, 229/TRIG 142 (ICD-272.4) Assessment: Unchanged Good results. His updated medication list for this problem includes:    Vytorin 10-80 Mg Tabs (Ezetimibe-simvastatin) .Marland Kitchen... Take one by mouth once a day  Labs Reviewed: SGOT: 28 (10/24/2009)   SGPT: 33 (10/24/2009)   HDL:54.90 (10/24/2009), 53.30 (10/15/2008)  LDL:83 (10/24/2009), 92 (10/15/2008)  Chol:157 (10/24/2009), 159 (10/15/2008)  Trig:94.0 (10/24/2009), 71.0 (10/15/2008)  Problem # 10:  HEMORRHOIDS (ICD-455.6) Assessment:  Unchanged Stable.  Complete Medication List: 1)  Zyrtec Allergy 10 Mg Tabs (Cetirizine hcl) .... Take 1 tablet by mouth once a day 2)  Fish Oil 1000 Mg Caps (Omega-3 fatty acids) .... Take 1 capsule by mouth 3times a day 3)  Bayer Aspirin 325 Mg Tabs (Aspirin) .... Take 1 tablet by mouth once a day 4)  Altace 10 Mg Caps (Ramipril) .... Take 1 capsule by mouth once a day 5)  Vytorin 10-80 Mg Tabs (Ezetimibe-simvastatin) .... Take one by mouth once a day 6)  Centrum Tabs (Multiple vitamins-minerals) .... Take one by mouth once a day 7)  Avodart 0.5 Mg Caps (Dutasteride) .... Take one by mouth once a day 8)  Metoprolol Succinate 25 Mg Xr24h-tab (Metoprolol succinate) .Marland Kitchen.. 11/2  tabs by mouth at night 9)  Clobetasol Propionate 0.05 % Oint (Clobetasol propionate) .... Apply to affcted area two times a day as needed 10)  Methocarbamol 500 Mg Tabs (Methocarbamol) .... Take one by mouth every 6-8 hours as needed 11)  Oxycodone-acetaminophen 5-500 Mg Caps (Oxycodone-acetaminophen) .... Take one by mouth every 4-6 hours as needed 12)  Levitra 20 Mg Tabs (Vardenafil hcl) .... One tab by mouth one hour prior to desired activity  Patient Instructions: 1)  RTC in 2 mos. 2)  Get PFT then. 3)  Consider Zostavax.   Prescriptions: METOPROLOL SUCCINATE 25 MG XR24H-TAB (METOPROLOL SUCCINATE) 11/2  tabs by mouth at night  #45 x 12   Entered and Authorized by:   Shaune Leeks MD   Signed by:   Shaune Leeks MD on 10/31/2009   Method used:   Electronically to        [facility] [address] (retail)       [address], [address]       [place], [place]  [address]       Ph: [phone]       Fax: [phone]   RxID:   [id number] LEVITRA 20 MG TABS (VARDENAFIL HCL) one tab by mouth one hour prior to desired activity  #10 x 12   Entered and Authorized by:   Shaune Leeks MD   Signed by:   Shaune Leeks MD on  10/31/2009   Method used:    Electronically to        [facility]* (retail)       [address]       [place], [place]  [address]       Ph: [phone]       Fax: [phone]   RxID:   [id number]   Current Allergies (reviewed today): AMOXICILLIN (AMOXICILLIN)

## 2010-08-21 NOTE — Assessment & Plan Note (Signed)
Summary: 6 WEEK FOLLOW UP BP CHECK/RBH   Vital Signs:  Patient profile:   77 year old male Weight:      243 pounds Temp:     97.2 degrees F oral Pulse rate:   68 / minute Pulse rhythm:   regular BP sitting:   158 / 74  (left arm) Cuff size:   large  Vitals Entered By: Sydell Axon LPN (July 03, 2010 9:17 AM) CC: 6 Week follow-up on BP   History of Present Illness: Pt here for 6 week followup for BP after having changed from BB to Amlodipine. He can tell no difference  in sxs but his BP is slightly better. He continues to assume he has white coat syndrome as his pressures at home are better than here but he then related it goes up if agitated, e.g. at the dog, etc.  He otherwise feels well. He had had some right shoulder pain but saw ortho and was put on steroids and muscle relaxers and it has helped. He has no complaints today.  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 Take 1 Tablet By Mouth Once A Day 2)  Fish Oil 1000 Mg Caps (Omega-3 Fatty Acids) .... Take 1 Capsule By Mouth 3times A Day 3)  Bayer Aspirin 325 Mg Tabs (Aspirin) .... Take 1 Tablet By Mouth Once A Day 4)  Altace 10 Mg Caps (Ramipril) .... Take 1 Capsule By Mouth Once A Day 5)  Vytorin 10-80 Mg Tabs (Ezetimibe-Simvastatin) .... Take  One By Mouth Once A Day 6)  Centrum   Tabs (Multiple Vitamins-Minerals) .... Take One By Mouth Once A Day 7)  Avodart 0.5 Mg  Caps (Dutasteride) .... Take One By Mouth Once A Day 8)  Amlodipine Besylate 5 Mg Tabs (Amlodipine Besylate) .... One Tab By Mouth At Night 9)  Clobetasol Propionate 0.05 % Oint (Clobetasol Propionate) .... Apply To Affcted Area Two Times A Day As Needed 10)  Hydrochlorothiazide 12.5 Mg Caps (Hydrochlorothiazide) .... One Tab By Mouth in Am 11)  Flovent Diskus 50 Mcg/blist Aepb (Fluticasone Propionate (Inhal)) .... One Inh By Mouth Two Times A Day  Current Medications (verified): 1)  Zyrtec Allergy 10 Mg Tabs (Cetirizine Hcl) .... Take 1 Tablet By Mouth Once A Day 2)  Fish Oil 1000 Mg Caps (Omega-3 Fatty Acids) .... Take 1 Capsule By Mouth 3times A Day 3)  Bayer Aspirin 325 Mg Tabs (Aspirin) .... Take 1 Tablet By Mouth Once A Day 4)  Altace 10 Mg Caps (Ramipril) .... Take 1 Capsule By Mouth Once A Day 5)  Vytorin 10-80 Mg Tabs (Ezetimibe-Simvastatin) .... Take One By Mouth Once A Day 6)  Centrum   Tabs (Multiple Vitamins-Minerals) .... Take One By Mouth Once A Day 7)  Avodart 0.5 Mg  Caps (Dutasteride) .... Take One By Mouth Once A Day 8)  Amlodipine Besylate 5 Mg Tabs (Amlodipine Besylate) .... One Tab By Mouth At Night 9)  Clobetasol Propionate 0.05 % Oint (Clobetasol Propionate) .... Apply To Affcted Area Two Times A Day As Needed 10)  Hydrochlorothiazide 12.5 Mg Caps (Hydrochlorothiazide) .... One Tab By Mouth in Am 11)  Flovent Diskus 50 Mcg/blist Aepb (Fluticasone Propionate (Inhal)) .... One Inh By Mouth Two Times A Day  Allergies: 1)  Amoxicillin (Amoxicillin)  Physical Exam  General:  Well-developed,well-nourished,in no acute distress; alert,appropriate and cooperative throughout examination. Nontoxic appearing. Not  audibly wheezing. Head:  Normocephalic and atraumatic without obvious abnormalities. No apparent alopecia, has mild male pattern  balding.  Sinuses NT. Eyes:  Conjunctiva clear bilaterally.  Ears:  External ear exam shows no significant lesions or deformities.  Otoscopic examination reveals clear canals, tympanic membranes are intact bilaterally without bulging, retraction, inflammation or discharge. Hearing is grossly normal bilaterally. Cerumen bilat. Nose:  External nasal examination shows no deformity or inflammation. Nasal mucosa are pink and moist without lesions or exudates. Mouth:  Oral mucosa and oropharynx without lesions or exudates.  Teeth in good repair. Neck:  No deformities, masses, or tenderness noted. Lungs:  Normal respiratory effort, chest expands symmetrically. Lungs are clear to auscultation, no crackles or wheezes. No wheezing heard. Heart:  Normal rate and regular rhythm. S1 and S2 normal without gallop, murmur, click, rub or other extra sounds.   Impression & Recommendations:  Problem # 1:  HYPERTENSION (ICD-401.9) Assessment Improved Will increase to 7.5mg  (11/2 tabs) daily. Call in 6 weeks if not in 130s at home. His updated medication list for this problem includes:    Altace 10 Mg Caps (Ramipril) .Marland Kitchen... Take 1 capsule by mouth once a day    Amlodipine Besylate 5 Mg Tabs (Amlodipine besylate) .Marland Kitchen... 11/2  tabs by mouth at night    Hydrochlorothiazide 12.5 Mg Caps (Hydrochlorothiazide) ..... One tab by mouth in am  BP today: 158/74 Prior BP: 162/80 (05/22/2010)  Labs Reviewed: K+: 4.9 (10/24/2009) Creat: : 0.9 (10/24/2009)   Chol: 157 (10/24/2009)   HDL: 54.90 (10/24/2009)   LDL: 83 (10/24/2009)   TG: 94.0 (10/24/2009)  Problem # 2:  DIABETES MELLITUS, TYPE II (ICD-250.00) Assessment: Unchanged  Needs eye exam. His updated medication list for this problem includes:    Bayer Aspirin 325 Mg Tabs (Aspirin) .Marland Kitchen... Take 1 tablet by mouth once a day    Altace 10 Mg Caps (Ramipril) .Marland Kitchen... Take 1 capsule by mouth once a day  Labs Reviewed: Creat: 0.9 (10/24/2009)   Microalbumin: 6.3  (09/30/2005) Reviewed HgBA1c results: 6.7 (10/24/2009)  6.5 (10/15/2008)  Complete Medication List: 1)  Zyrtec Allergy 10 Mg Tabs (Cetirizine hcl) .... Take 1 tablet by mouth once a day 2)  Fish Oil 1000 Mg Caps (Omega-3 fatty acids) .... Take 1 capsule by mouth 3times a day 3)  Bayer Aspirin 325 Mg Tabs (Aspirin) .... Take 1 tablet by mouth once a day 4)  Altace 10 Mg Caps (Ramipril) .... Take 1 capsule by mouth once a day 5)  Vytorin 10-80 Mg Tabs (Ezetimibe-simvastatin) .... Take one by mouth once a day 6)  Centrum Tabs (Multiple vitamins-minerals) .... Take one by mouth once a day 7)  Avodart 0.5 Mg Caps (Dutasteride) .... Take one by mouth once a day 8)  Amlodipine Besylate 5 Mg Tabs (Amlodipine besylate) .Marland Kitchen.. 11/2  tabs by mouth at night 9)  Clobetasol Propionate 0.05 % Oint (Clobetasol propionate) .... Apply to affcted area two times a day as needed 10)  Hydrochlorothiazide 12.5 Mg Caps (Hydrochlorothiazide) .... One tab by mouth in am 11)  Flovent Diskus 50 Mcg/blist Aepb (Fluticasone propionate (inhal)) .... One inh by mouth two times a day Prescriptions: AMLODIPINE BESYLATE 5 MG TABS (AMLODIPINE BESYLATE) 11/2  tabs by mouth at night  #45 x 12   Entered and Authorized by:   Shaune Leeks MD   Signed by:   Shaune Leeks MD on 07/03/2010   Method used:   Electronically to        [facility] [address] (retail)       [address], [address]       [place], [place]  [address]       Ph: [phone]       Fax: [phone]   RxID:   [id number]    Orders Added: 1)  Est. Patient Level III [84132]  Appended Document: 6 WEEK FOLLOW UP BP CHECK/RBH     Clinical Lists Changes  Observations: Added new observation of DMEYEEXAMNXT: 06/2011 (07/03/2010 10:43) Added new observation of DMEYEEXMRES: normal (07/03/2010 10:43) Added new observation of EYE EXAM BY: Dr Larence Penning (07/03/2010 10:43) Added new observation of DIAB EYE EX: normal  (07/03/2010 10:43)        Diabetes Management Exam:    Eye Exam:       Eye Exam done elsewhere          Date: 07/03/2010          Results: normal          Done by: Dr Larence Penning Regularly has his eyes checked by Dr Larence Penning in [place].

## 2010-08-21 NOTE — Progress Notes (Signed)
Summary: order form for zostavax  Phone Note From Pharmacy   Caller: [facility] Summary of Call: Pt is requesting zostavax,  order form from [facility] is on your shelf. Initial call taken by: Lowella Petties CMA,  January 02, 2010 11:27 AM  Follow-up for Phone Call        Signed. Follow-up by: Shaune Leeks MD,  January 02, 2010 11:36 AM  Additional Follow-up for Phone Call Additional follow up Details #1::        Order faxed., placed up front for scanning. Additional Follow-up by: Lowella Petties CMA,  January 02, 2010 11:40 AM     Appended Document: order form for zostavax     Clinical Lists Changes  Observations: Added new observation of ZOSTAVAX: Zostavax (01/02/2010 17:54)       Other Immunization History:    Zostavax # 1:  Zostavax (01/02/2010)

## 2010-08-21 NOTE — Letter (Signed)
Summary: [facility] Follow Up [facility]  [facility] Follow [facility]   Imported By: Beau Fanny 07/25/2009 14:29:58  _____________________________________________________________________  External Attachment:    Type:   Image     Comment:   External Document

## 2010-08-21 NOTE — Assessment & Plan Note (Signed)
Summary: FLU SHOT / LFW   Nurse Visit   Allergies: 1)  Amoxicillin (Amoxicillin)  Immunizations Administered:  Influenza Vaccine # 1:    Vaccine Type: Fluvax 3+    Site: left deltoid    Mfr: GlaxoSmithKline    Dose: 0.5 ml    Route: IM    Given by: Mervin Hack CMA (AAMA)    Exp. Date: 01/17/2011    Lot #: [id number]    VIS given: 02/11/10 version given April 08, 2010.  Flu Vaccine Consent Questions:    Do you have a history of severe allergic reactions to this vaccine? no    Any prior history of allergic reactions to egg and/or gelatin? no    Do you have a sensitivity to the preservative Thimersol? no    Do you have a past history of Guillan-Barre Syndrome? no    Do you currently have an acute febrile illness? no    Have you ever had a severe reaction to latex? no    Vaccine information given and explained to patient? yes  Orders Added: 1)  Flu Vaccine 32yrs + [90658] 2)  Admin 1st Vaccine [09811]

## 2010-08-21 NOTE — Assessment & Plan Note (Signed)
Summary: 1 MONTH FOLLOW UP/[facility]   Vital Signs:  Patient profile:   77 year old male Weight:      241.50 pounds Temp:     97.6 degrees F oral Pulse rate:   60 / minute Pulse rhythm:   regular BP sitting:   162 / 80  (left arm) Cuff size:   large  Vitals Entered By: Sydell Axon LPN (May 22, 2010 8:07 AM) CC: Follow-up on BP   History of Present Illness: Pt here to recheck BP at it was high last visit but he thought it to be lower in general and said he could check it at home. He has done so and his pressures vary, average 140/80s but he often is 160-170 and I feel we should improve that. He feels well otherwise with no complaints.  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 Take 1 Tablet By Mouth Once A Day 2)  Fish Oil 1000 Mg Caps (Omega-3 Fatty Acids) .... Take 1 Capsule By Mouth 3times A Day 3)  Bayer Aspirin 325 Mg Tabs (Aspirin) .... Take 1 Tablet By Mouth Once A Day 4)  Altace 10 Mg Caps (Ramipril) .... Take 1 Capsule By Mouth Once A Day 5)  Vytorin 10-80 Mg Tabs (Ezetimibe-Simvastatin) .... Take One By Mouth Once A Day 6)  Centrum   Tabs (Multiple Vitamins-Minerals) .... Take One By Mouth Once A Day 7)  Avodart 0.5 Mg  Caps (Dutasteride) .... Take One By Mouth Once A Day 8)  Metoprolol  Succinate 25 Mg Xr24h-Tab (Metoprolol Succinate) .Marland Kitchen.. 11/2  Tabs By Mouth At Night 9)  Clobetasol Propionate 0.05 % Oint (Clobetasol Propionate) .... Apply To Affcted Area Two Times A Day As Needed 10)  Methocarbamol 500 Mg Tabs (Methocarbamol) .... Take One By Mouth Every 6-8 Hours As Needed 11)  Hydrochlorothiazide 12.5 Mg Caps (Hydrochlorothiazide) .... One Tab By Mouth in Am 12)  Flovent Diskus 50 Mcg/blist Aepb (Fluticasone Propionate (Inhal)) .... One Inh By Mouth Two Times A Day  Allergies: 1)  Amoxicillin (Amoxicillin)  Physical Exam  General:  Well-developed,well-nourished,in no acute distress; alert,appropriate and cooperative throughout examination. Nontoxic appearing. Not  audibly wheezing. Head:  Normocephalic and atraumatic without obvious abnormalities. No apparent alopecia, has mild male pattern  balding. Sinuses NT. Eyes:  Conjunctiva clear bilaterally.  Ears:  External ear exam shows no significant lesions or deformities.  Otoscopic examination reveals clear canals, tympanic membranes are intact bilaterally without bulging, retraction, inflammation or discharge. Hearing is grossly normal bilaterally. Cerumen bilat. Nose:  External nasal examination shows no deformity or inflammation. Nasal mucosa are pink and moist without lesions or exudates. Mouth:  Oral mucosa and oropharynx without lesions or exudates.  Teeth in good repair. Neck:  No deformities, masses, or tenderness noted. Lungs:  Normal respiratory effort, chest expands symmetrically. Lungs are clear to auscultation, no  crackles or wheezes. No wheezing heard. Heart:  Normal rate and regular rhythm. S1 and S2 normal without gallop, murmur, click, rub or other extra sounds.   Impression & Recommendations:  Problem # 1:  HYPERTENSION (ICD-401.9) Assessment Unchanged Still high...due to lung disease, will stop Metoprolol and start Amlodipine at 5mg  for now and recheck in 6 weeks with possibility of increasing to  10mg . His updated medication list for this problem includes:    Altace 10 Mg Caps (Ramipril) .Marland Kitchen... Take 1 capsule by mouth once a day    Amlodipine Besylate 5 Mg Tabs (Amlodipine besylate) ..... One tab by mouth at night    Hydrochlorothiazide 12.5 Mg Caps (Hydrochlorothiazide) ..... One tab by mouth in am  BP today: 162/80 Prior BP: 180/90 (04/23/2010)  Labs Reviewed: K+: 4.9 (10/24/2009) Creat: : 0.9 (10/24/2009)   Chol: 157 (10/24/2009)   HDL: 54.90 (10/24/2009)   LDL: 83 (10/24/2009)   TG: 94.0 (10/24/2009)  Problem # 2:  DIABETES MELLITUS, TYPE II (ICD-250.00) Assessment: Unchanged "I'm not diabetic."  Discussed being careful with sweets and carbs to avoid acceleration. His updated medication list for this problem includes:    Bayer Aspirin 325 Mg Tabs (Aspirin) .Marland Kitchen... Take 1 tablet by mouth once a day    Altace 10 Mg Caps (Ramipril) .Marland Kitchen... Take 1 capsule by mouth once a day  Complete Medication List: 1)  Zyrtec Allergy 10 Mg Tabs (Cetirizine hcl) .... Take 1 tablet by mouth once a day 2)  Fish Oil 1000 Mg Caps (Omega-3 fatty acids) .... Take 1 capsule by mouth 3times a day 3)  Bayer Aspirin 325 Mg Tabs (Aspirin) .... Take 1 tablet by mouth once a day 4)  Altace 10 Mg Caps (Ramipril) .... Take 1 capsule by mouth once a day 5)  Vytorin 10-80 Mg Tabs (Ezetimibe-simvastatin) .... Take one by mouth once a day 6)  Centrum Tabs (Multiple vitamins-minerals) .... Take one by mouth once a day 7)  Avodart 0.5 Mg Caps (Dutasteride) .... Take one by mouth once a day 8)  Amlodipine Besylate 5 Mg Tabs (Amlodipine besylate) .... One tab by mouth at night 9)  Clobetasol Propionate 0.05 % Oint (Clobetasol propionate) .... Apply to affcted area two times a day as needed 10)  Hydrochlorothiazide 12.5 Mg Caps (Hydrochlorothiazide) .... One tab by mouth in am 11)  Flovent Diskus 50 Mcg/blist Aepb (Fluticasone propionate (inhal)) .... One inh by mouth two times a day  Patient Instructions: 1)  RTC 6  weeks, BP check. Prescriptions: AMLODIPINE BESYLATE 5 MG TABS (AMLODIPINE BESYLATE) one tab by mouth at night  #30 x 12   Entered and Authorized by:   Shaune Leeks MD   Signed by:   Shaune Leeks MD on 05/22/2010   Method used:   Electronically to        [facility] [address] (retail)       [address], [address]       [place], [place]  [address]       Ph: [phone]       Fax: [phone]   RxID:   [id number]    Orders Added: 1)  Est. Patient Level III [01027]    Current Allergies (reviewed today): AMOXICILLIN (AMOXICILLIN)

## 2010-08-21 NOTE — Assessment & Plan Note (Signed)
Summary: 2 MONTH FOLLOW UPR/BH   Vital Signs:  Patient profile:   77 year old male Weight:      236.75 pounds Temp:     98.4 degrees F oral Pulse rate:   60 / minute Pulse rhythm:   regular BP sitting:   150 / 86  (left arm) Cuff size:   large  Vitals Entered By: Sydell Axon LPN (January 01, 2010 9:16 AM) CC: 2 Month follow-up   History of Present Illness: Pt here for followup. He did not get Levitra due to price. He is tolerating increased Metoprolol w/o difficulty. His pressure is still slightly elevated.  He has had a cough which is on the way out and he is feeling better. He is audibly wheezing slightly.  The Methocarbamol allows him to sleep well at night without legs bothering him. He uses Cutivate regularly sparingly on the right elbow antecubital fossa  area for nonspecific dryness and rash.  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 Take 1 Tablet By Mouth Once A Day 2)  Fish Oil 1000 Mg Caps (Omega-3 Fatty Acids) .... Take 1 Capsule By Mouth 3times A Day 3)  Bayer Aspirin 325 Mg Tabs (Aspirin) .... Take 1 Tablet By Mouth Once A Day 4)  Altace 10 Mg Caps (Ramipril) .... Take 1 Capsule By Mouth Once A Day 5)  Vytorin 10-80 Mg Tabs (Ezetimibe-Simvastatin) .... Take One By Mouth Once A Day 6)  Centrum   Tabs (Multiple Vitamins-Minerals) .... Take One By  Mouth Once A Day 7)  Avodart 0.5 Mg  Caps (Dutasteride) .... Take One By Mouth Once A Day 8)  Metoprolol Succinate 25 Mg Xr24h-Tab (Metoprolol Succinate) .Marland Kitchen.. 11/2  Tabs By Mouth At Night 9)  Clobetasol Propionate 0.05 % Oint (Clobetasol Propionate) .... Apply To Affcted Area Two Times A Day As Needed 10)  Methocarbamol 500 Mg Tabs (Methocarbamol) .... Take One By Mouth Every 6-8 Hours As Needed 11)  Oxycodone-Acetaminophen 5-500 Mg Caps (Oxycodone-Acetaminophen) .... Take One By Mouth Every 4-6 Hours As Needed 12)  Levitra 20 Mg Tabs (Vardenafil Hcl) .... One Tab By Mouth One Hour Prior To Desired Activity  Allergies: 1)  Amoxicillin (Amoxicillin)  Physical Exam  General:  Well-developed,well-nourished,in no acute distress; alert,appropriate and cooperative throughout examination. Nontoxic appearing. Mildly audibly wheezing. Head:  Normocephalic and atraumatic without obvious abnormalities. No apparent alopecia, has mild male pattern  balding. Sinuses NT. Eyes:  Conjunctiva clear bilaterally.  Ears:  External ear exam shows no significant lesions or deformities.  Otoscopic examination reveals clear canals, tympanic membranes are intact bilaterally without bulging, retraction, inflammation or discharge. Hearing is grossly normal bilaterally. Cerumen bilat. Nose:  External nasal examination shows no deformity or inflammation. Nasal mucosa are pink and moist without lesions or exudates. Mouth:  Oral mucosa and oropharynx without lesions or exudates.  Teeth in good repair. Neck:  No deformities, masses, or tenderness  noted. Chest Wall:  No deformities, masses, tenderness or gynecomastia noted. Lungs:  Normal respiratory effort, chest expands symmetrically. Lungs are clear to auscultation, no crackles or wheezes. No wheezing heard eventho audibly wheezing. Heart:  Normal rate and regular rhythm. S1 and S2 normal without gallop, murmur, click, rub or other extra sounds.   Impression &  Recommendations:  Problem # 1:  HYPERTENSION (ICD-401.9) Assessment Improved Improved. Will add HCTZ 12.5mg  in AM. His updated medication list for this problem includes:    Altace 10 Mg Caps (Ramipril) .Marland Kitchen... Take 1 capsule by mouth once a day    Metoprolol Succinate 25 Mg Xr24h-tab (Metoprolol succinate) .Marland Kitchen... 11/2  tabs by mouth at night    Hydrochlorothiazide 12.5 Mg Caps (Hydrochlorothiazide) ..... One tab by mouth in am  BP today: 150/86 Prior BP: 170/88 (10/31/2009)  Labs Reviewed: K+: 4.9 (10/24/2009) Creat: : 0.9 (10/24/2009)   Chol: 157 (10/24/2009)   HDL: 54.90 (10/24/2009)   LDL: 83 (10/24/2009)   TG: 94.0 (10/24/2009)  Problem # 2:  RESTLESS LEG SYNDROME (ICD-333.94) Assessment: Unchanged OK on Methocarbamol.  Problem # 3:  ADENOCARCINOMA, LUNG, S/P PARTIAL LOBECTOMY LUL (ICD-162.9) Assessment: Unchanged Audibly wheezing with resolving cold. Will get PFT today to assess.  PFT shows moderately severe obstruction. Smoking history remote so will assume more reactive. Will try FLuticasone.  Problem # 4:  CHRONIC OBSTRUCTIVE PULMONARY DISEASE (ICD-496) Assessment: New PFT with mod severe obstruction.  Will try Flovent and recheck in 4 mos. His updated medication list for this problem includes:    Flovent Diskus 50 Mcg/blist Aepb (Fluticasone propionate (inhal)) ..... One inh by mouth two times a day  Problem # 5:  ORGANIC IMPOTENCE (ICD-607.84) Assessment: Unchanged Has not used Levitra due to cost. The following medications were removed from the medication list:    Levitra 20 Mg Tabs (Vardenafil hcl) ..... One tab by mouth one hour prior to desired activity  Complete Medication List: 1)  Zyrtec Allergy 10 Mg Tabs (Cetirizine hcl) .... Take 1 tablet by mouth once a day 2)  Fish Oil 1000 Mg Caps (Omega-3 fatty acids) .... Take 1 capsule by mouth 3times a day 3)  Bayer Aspirin 325 Mg Tabs (Aspirin) .... Take 1 tablet by mouth once a day 4)  Altace 10 Mg Caps (Ramipril)  .... Take 1 capsule by mouth once a day 5)  Vytorin 10-80 Mg Tabs (Ezetimibe-simvastatin) .... Take one by mouth once a day 6)  Centrum Tabs (Multiple vitamins-minerals) .... Take one by mouth once a day 7)  Avodart 0.5 Mg Caps (Dutasteride) .... Take one by mouth once a day 8)  Metoprolol Succinate 25 Mg Xr24h-tab (Metoprolol succinate) .Marland Kitchen.. 11/2  tabs by mouth at night 9)  Clobetasol Propionate 0.05 % Oint (Clobetasol propionate) .... Apply to affcted area two times a day as needed 10)  Methocarbamol 500 Mg Tabs (Methocarbamol) .... Take one by mouth every 6-8 hours as needed 11)  Hydrochlorothiazide 12.5 Mg Caps (Hydrochlorothiazide) .... One tab by mouth in am 12)  Flovent Diskus 50 Mcg/blist Aepb (Fluticasone propionate (inhal)) .... One inh by mouth two times a day  Patient Instructions: 1)  Call in Jul for appt in Oct.  Bmet prior 401.9 2)  Start inhaler in Aug. 3)  Call [employer] for Zostavax. Prescriptions: FLOVENT DISKUS 50 MCG/BLIST AEPB (FLUTICASONE PROPIONATE (INHAL)) one inh by mouth two times a day  #1 discus x 12   Entered and Authorized by:   Shaune Leeks MD   Signed  by:   Shaune Leeks MD on 01/01/2010   Method used:   Electronically to        [facility] [address] (retail)       [address], [address]       [place], [place]  [address]       Ph: [phone]       Fax: [phone]   RxID:   [id number] METHOCARBAMOL 500 MG TABS (METHOCARBAMOL) Take one by mouth every 6-8 hours as needed  #50 x 1   Entered and Authorized by:   Shaune Leeks MD   Signed by:   Shaune Leeks MD on 01/01/2010   Method used:   Electronically to        [facility] [address] (retail)       [address], [address]       [place], [place]  [address]       Ph: [phone]       Fax: [phone]   RxID:   [id number] CLOBETASOL PROPIONATE 0.05 % OINT (CLOBETASOL PROPIONATE) apply to  affcted area two times a day as needed  #30gms x 1   Entered and Authorized by:   Shaune Leeks MD   Signed by:   Shaune Leeks MD on 01/01/2010   Method used:   Electronically to        [facility] [address] (retail)       [address], [address]       [place], [place]  [address]       Ph: [phone]       Fax: [phone]   RxID:   [id number] HYDROCHLOROTHIAZIDE 12.5 MG CAPS (HYDROCHLOROTHIAZIDE) one tab by mouth in AM  #30 x 12   Entered and Authorized by:   Shaune Leeks MD   Signed by:   Shaune Leeks MD on 01/01/2010   Method used:   Electronically to        [facility] [address] (retail)       [address], [address]       [place], [place]  [address]       Ph: [phone]       Fax: [phone]   RxID:   [id number]   Current Allergies (reviewed today): AMOXICILLIN (AMOXICILLIN)  Appended Document: 2 MONTH FOLLOW UPR/BH     Clinical Lists Changes  Orders: Added new Service order of Spirometry w/Graph (94010) - Signed

## 2010-08-21 NOTE — Assessment & Plan Note (Signed)
Summary: F/U/CLE   Vital Signs:  Patient profile:   77 year old male Weight:      242 pounds Temp:     97.2 degrees F oral Pulse rate:   60 / minute Pulse rhythm:   regular BP sitting:   180 / 90  (left arm) Cuff size:   large  Vitals Entered By: Sydell Axon LPN (April 23, 2010 8:59 AM) CC: follow-up visit   History of Present Illness: Pt here for followup of BP check. He has been taking his new med HCTZ regularly and sees no impact physically. He does not check his pressure other places and it is high even when he goes to the eye doctor.  He also notices SOB with exertion which has been going on a long time...he has had partial lung resection on the left in the past. He generally feels well.  Allergies: 1)  Amoxicillin (Amoxicillin)  Past History:  Past Medical History: Last updated: 03/17/2007 Hyperlipidemia Hypertension Diabetes mellitus, type II Benign prostatic hypertrophy  Past Surgical History: Last updated: 03/05/2008 Hemmorroidectomy 1970 R Catarract Repair,detached retina Septoplasty Right cataract removed/ detached retina Wedge resection LUL, LUL lobectomy,  [place]  03/15/01 CT Scan Chest - stable 08/03 CT Chest with contrast- stable 02/19/05 Colonoscopy wnl 09/16/05 R L4/5 secondary herniated disc 8/06 ECHO LVF Nml EF 60% Mild AV&MV Calcif  MIld LAE 02/29/08  Physical Exam  General:  Well-developed,well-nourished,in no acute distress; alert,appropriate and cooperative throughout examination. Nontoxic appearing. Not  audibly wheezing. Head:  Normocephalic and atraumatic without obvious abnormalities. No apparent alopecia, has mild male pattern  balding. Sinuses NT. Eyes:  Conjunctiva clear bilaterally.  Ears:  External ear exam shows no significant lesions or deformities.  Otoscopic examination reveals clear canals, tympanic membranes are intact bilaterally without bulging, retraction, inflammation or discharge. Hearing is grossly normal  bilaterally. Cerumen bilat. Nose:  External nasal examination shows no deformity or inflammation. Nasal mucosa are pink and moist without lesions or exudates. Mouth:  Oral mucosa and oropharynx without lesions or exudates.  Teeth in good repair. Neck:  No deformities, masses, or tenderness noted. Chest Wall:  No deformities, masses, tenderness or gynecomastia noted. Lungs:  Normal respiratory effort, chest expands symmetrically. Lungs are clear to auscultation, no crackles or wheezes. No wheezing heard. Heart:  Normal rate and regular rhythm. S1 and S2 normal without gallop, murmur, click, rub or other extra sounds. Abdomen:  No suprapubic tenderness.   Impression & Recommendations:  Problem # 1:  HYPERTENSION (ICD-401.9) Assessment Deteriorated  Sliughtly worse today. Will get ambulatory readings at home two times a day for one month and reassess. His updated medication list for this problem includes:    Altace 10 Mg Caps (Ramipril) .Marland Kitchen... Take 1 capsule by mouth once a day    Metoprolol Succinate 25 Mg Xr24h-tab (Metoprolol succinate) .Marland Kitchen... 11/2  tabs by mouth at night    Hydrochlorothiazide 12.5 Mg Caps (Hydrochlorothiazide) ..... One tab by mouth in am  BP today: 180/90 Prior BP: 150/86 (01/01/2010)  Labs Reviewed: K+: 4.9 (10/24/2009) Creat: : 0.9 (10/24/2009)   Chol: 157 (10/24/2009)   HDL: 54.90 (10/24/2009)   LDL: 83 (10/24/2009)   TG: 94.0 (10/24/2009)  Problem # 2:  CHRONIC OBSTRUCTIVE PULMONARY DISEASE (ICD-496) Assessment: Unchanged  Observe. Flovent seems to have made no difference. Will discuss again possible Pulm referral for tuneup next time. His updated medication list for this problem includes:    Flovent Diskus 50 Mcg/blist Aepb (Fluticasone propionate (inhal)) ..... One inh  by mouth two times a day  Vaccines Reviewed: Pneumovax: Pneumovax (03/03/2008)   Flu Vax: Fluvax 3+ (04/08/2010)  Complete Medication List: 1)  Zyrtec Allergy 10 Mg Tabs (Cetirizine hcl)  .... Take 1 tablet by mouth once a day 2)  Fish Oil 1000 Mg Caps (Omega-3 fatty acids) .... Take 1 capsule by mouth 3times a day 3)  Bayer Aspirin 325 Mg Tabs (Aspirin) .... Take 1 tablet by mouth once a day 4)  Altace 10 Mg Caps (Ramipril) .... Take 1 capsule by mouth once a day 5)  Vytorin 10-80 Mg Tabs (Ezetimibe-simvastatin) .... Take one by mouth once a day 6)  Centrum Tabs (Multiple vitamins-minerals) .... Take one by mouth once a day 7)  Avodart 0.5 Mg Caps (Dutasteride) .... Take one by mouth once a day 8)  Metoprolol Succinate 25 Mg Xr24h-tab (Metoprolol succinate) .Marland Kitchen.. 11/2  tabs by mouth at night 9)  Clobetasol Propionate 0.05 % Oint (Clobetasol propionate) .... Apply to affcted area two times a day as needed 10)  Methocarbamol 500 Mg Tabs (Methocarbamol) .... Take one by mouth every 6-8 hours as needed 11)  Hydrochlorothiazide 12.5 Mg Caps (Hydrochlorothiazide) .... One tab by mouth in am 12)  Flovent Diskus 50 Mcg/blist Aepb (Fluticasone propionate (inhal)) .... One inh by mouth two times a day  Patient Instructions: 1)  RTC 1 month  to recheck ambulatory pressures. 2)  Discuss lung function vs pulm referral then   Current Allergies (reviewed today): AMOXICILLIN (AMOXICILLIN)

## 2010-08-21 NOTE — Letter (Signed)
[facility]   Imported By: Lanelle Bal 11/22/2009 09:28:35  _____________________________________________________________________  External Attachment:    Type:   Image     Comment:   External Document

## 2010-08-21 NOTE — Miscellaneous (Signed)
Summary: zostavax   Clinical Lists Changes  Observations: Added new observation of ZOSTAVAX: Zostavax (01/04/2010 13:09)      Other Immunization History:    Zostavax # 1:  Zostavax (01/04/2010) Given at [facility].                       Lowella Petties CMA  January 15, 2010 1:09 PM

## 2010-08-21 NOTE — Letter (Signed)
Summary: Nadara Eaton letter  [place] [facility]  [address] [place], [place] [address]   Phone: [phone]  Fax: [phone]       02/25/2010 MRN: [id number]  [address] [place], [place]  [address]  Dear Mr. [facility] [place], and [place] announce the retirement of Arta Silence, M.D., from full-time practice at the [facility] office effective January 16, 2010 and his plans of returning part-time.  It is important to Dr. Hetty Ely and to our practice that you understand that [facility] has seven physicians in our office for your health care needs.  We will continue to offer the same exceptional care that you have today.    Dr. Hetty Ely has spoken to many of you about his plans for retirement and returning part-time in the fall.   We will continue to work with you through the transition to schedule appointments for you in the office and meet the high standards that [place] is committed to.   Again, it is with great pleasure that we share the news that Dr. Hetty Ely will return to [facility] in October of 2011 with a reduced schedule.    If you have any questions, or would like to request an appointment with one of our physicians, please call us at [phone] and press the option for Scheduling an appointment.  We take pleasure in providing you with excellent patient care and look forward to seeing you at your next office visit.  Our [facility] Physicians are:  Tillman Abide, M.D. Laurita Quint, M.D. Roxy Manns, M.D. Kerby Nora, M.D. Hannah Beat, M.D. Ruthe Mannan, M.D. We proudly welcomed Raechel Ache, M.D. and Eustaquio Boyden, M.D. to the practice in July/August 2011.  Sincerely,  [facility]

## 2010-09-24 ENCOUNTER — Encounter: Payer: Self-pay | Admitting: Family Medicine

## 2010-10-23 ENCOUNTER — Other Ambulatory Visit: Payer: Self-pay | Admitting: Family Medicine

## 2010-10-23 DIAGNOSIS — G2581 Restless legs syndrome: Secondary | ICD-10-CM

## 2010-10-23 DIAGNOSIS — J449 Chronic obstructive pulmonary disease, unspecified: Secondary | ICD-10-CM

## 2010-10-23 DIAGNOSIS — N400 Enlarged prostate without lower urinary tract symptoms: Secondary | ICD-10-CM

## 2010-10-23 DIAGNOSIS — E119 Type 2 diabetes mellitus without complications: Secondary | ICD-10-CM

## 2010-10-23 DIAGNOSIS — I10 Essential (primary) hypertension: Secondary | ICD-10-CM

## 2010-10-24 LAB — CBC
HCT: 30.8 % — ABNORMAL LOW (ref 39.0–52.0)
HCT: 31.5 % — ABNORMAL LOW (ref 39.0–52.0)
HCT: 34.5 % — ABNORMAL LOW (ref 39.0–52.0)
HCT: 43.4 % (ref 39.0–52.0)
Hemoglobin: 10.6 g/dL — ABNORMAL LOW (ref 13.0–17.0)
Hemoglobin: 10.8 g/dL — ABNORMAL LOW (ref 13.0–17.0)
Hemoglobin: 14.6 g/dL (ref 13.0–17.0)
MCHC: 34.5 g/dL (ref 30.0–36.0)
MCV: 89.2 fL (ref 78.0–100.0)
MCV: 88.8 fL (ref 78.0–100.0)
Platelets: 172 10*3/uL (ref 150–400)
RBC: 3.5 MIL/uL — ABNORMAL LOW (ref 4.22–5.81)
RBC: 3.53 MIL/uL — ABNORMAL LOW (ref 4.22–5.81)
WBC: 12.6 10*3/uL — ABNORMAL HIGH (ref 4.0–10.5)
WBC: 11.7 10*3/uL — ABNORMAL HIGH (ref 4.0–10.5)
WBC: 7.5 10*3/uL (ref 4.0–10.5)

## 2010-10-24 LAB — BASIC METABOLIC PANEL
BUN: 11 mg/dL (ref 6–23)
CO2: 27 mEq/L (ref 19–32)
Calcium: 7.9 mg/dL — ABNORMAL LOW (ref 8.4–10.5)
Chloride: 100 mEq/L (ref 96–112)
Chloride: 100 mEq/L (ref 96–112)
GFR calc Af Amer: >60 mL/min (ref >60)
GFR calc Af Amer: >60 mL/min (ref >60)
GFR calc non Af Amer: >60 mL/min (ref >60)
GFR calc non Af Amer: >60 mL/min (ref >60)
Glucose, Bld: 171 mg/dL — ABNORMAL HIGH (ref 70–99)
Potassium: 3.9 mEq/L (ref 3.5–5.1)
Potassium: 4 mEq/L (ref 3.5–5.1)
Potassium: 4.6 mEq/L (ref 3.5–5.1)
Potassium: 4.7 mEq/L (ref 3.5–5.1)
Sodium: 132 mEq/L — ABNORMAL LOW (ref 135–145)
Sodium: 140 mEq/L (ref 135–145)

## 2010-10-24 LAB — PROTIME-INR
INR: 1.6 — ABNORMAL HIGH (ref 0.00–1.49)
INR: 1.8 — ABNORMAL HIGH (ref 0.00–1.49)
Prothrombin Time: 20.9 seconds — ABNORMAL HIGH (ref 11.6–15.2)

## 2010-10-24 LAB — APTT: aPTT: 31 seconds (ref 24–37)

## 2010-10-24 LAB — TYPE AND SCREEN: ABO/RH(D): A NEG

## 2010-10-24 LAB — ABO/RH: ABO/RH(D): A NEG

## 2010-10-27 ENCOUNTER — Other Ambulatory Visit (INDEPENDENT_AMBULATORY_CARE_PROVIDER_SITE_OTHER): Payer: Medicare Other | Admitting: Family Medicine

## 2010-10-27 DIAGNOSIS — G2581 Restless legs syndrome: Secondary | ICD-10-CM

## 2010-10-27 DIAGNOSIS — E785 Hyperlipidemia, unspecified: Secondary | ICD-10-CM

## 2010-10-27 DIAGNOSIS — E119 Type 2 diabetes mellitus without complications: Secondary | ICD-10-CM

## 2010-10-27 DIAGNOSIS — I10 Essential (primary) hypertension: Secondary | ICD-10-CM

## 2010-10-27 DIAGNOSIS — Z79899 Other long term (current) drug therapy: Secondary | ICD-10-CM

## 2010-10-27 DIAGNOSIS — J4489 Other specified chronic obstructive pulmonary disease: Secondary | ICD-10-CM

## 2010-10-27 DIAGNOSIS — N400 Enlarged prostate without lower urinary tract symptoms: Secondary | ICD-10-CM

## 2010-10-27 DIAGNOSIS — J449 Chronic obstructive pulmonary disease, unspecified: Secondary | ICD-10-CM

## 2010-10-27 LAB — CBC WITH DIFFERENTIAL/PLATELET
Basophils Absolute: 0 10*3/uL (ref 0.0–0.1)
Eosinophils Absolute: 0 10*3/uL (ref 0.0–0.7)
Lymphs Abs: 1.8 10*3/uL (ref 0.7–4.0)
MCHC: 34.6 g/dL (ref 30.0–36.0)
MCV: 87.8 fl (ref 78.0–100.0)
Monocytes Absolute: 0.7 10*3/uL (ref 0.1–1.0)
Neutrophils Relative %: 65.8 % (ref 43.0–77.0)
Platelets: 191 10*3/uL (ref 150.0–400.0)
RDW: 13.7 % (ref 11.5–14.6)
WBC: 7.6 10*3/uL (ref 4.5–10.5)

## 2010-10-27 LAB — HEPATIC FUNCTION PANEL
ALT: 23 U/L (ref 0–53)
AST: 20 U/L (ref 0–37)
Bilirubin, Direct: 0.1 mg/dL (ref 0.0–0.3)
Total Protein: 6.2 g/dL (ref 6.0–8.3)

## 2010-10-27 LAB — TSH: TSH: 0.91 u[IU]/mL (ref 0.35–5.50)

## 2010-10-27 LAB — PSA: PSA: 1.56 ng/mL (ref 0.10–4.00)

## 2010-10-27 LAB — BASIC METABOLIC PANEL
Calcium: 9.2 mg/dL (ref 8.4–10.5)
Creatinine, Ser: 0.9 mg/dL (ref 0.4–1.5)
GFR: 89.63 mL/min (ref >60.00)
Sodium: 138 mEq/L (ref 135–145)

## 2010-10-27 LAB — LIPID PANEL
Total CHOL/HDL Ratio: 3
Triglycerides: 84 mg/dL (ref 0.0–149.0)

## 2010-10-27 LAB — MICROALBUMIN / CREATININE URINE RATIO
Creatinine,U: 240.3 mg/dL
Microalb Creat Ratio: 0.7 mg/g (ref 0.0–30.0)
Microalb, Ur: 1.6 mg/dL (ref 0.0–1.9)

## 2010-11-17 ENCOUNTER — Other Ambulatory Visit: Payer: Self-pay

## 2010-11-19 ENCOUNTER — Ambulatory Visit (INDEPENDENT_AMBULATORY_CARE_PROVIDER_SITE_OTHER): Payer: Medicare Other | Admitting: Family Medicine

## 2010-11-19 ENCOUNTER — Encounter: Payer: Self-pay | Admitting: Family Medicine

## 2010-11-19 VITALS — BP 156/74 | HR 84 | Temp 98.0°F | Ht 74.25 in | Wt 234.8 lb

## 2010-11-19 DIAGNOSIS — E785 Hyperlipidemia, unspecified: Secondary | ICD-10-CM | POA: Insufficient documentation

## 2010-11-19 DIAGNOSIS — E119 Type 2 diabetes mellitus without complications: Secondary | ICD-10-CM

## 2010-11-19 DIAGNOSIS — I10 Essential (primary) hypertension: Secondary | ICD-10-CM

## 2010-11-19 DIAGNOSIS — M1710 Unilateral primary osteoarthritis, unspecified knee: Secondary | ICD-10-CM

## 2010-11-19 DIAGNOSIS — IMO0002 Reserved for concepts with insufficient information to code with codable children: Secondary | ICD-10-CM

## 2010-11-19 DIAGNOSIS — N400 Enlarged prostate without lower urinary tract symptoms: Secondary | ICD-10-CM

## 2010-11-19 MED ORDER — ATORVASTATIN CALCIUM 80 MG PO TABS: 80.0000 mg | ORAL_TABLET | Freq: Every day | ORAL | Status: DC

## 2010-11-19 NOTE — Patient Instructions (Signed)
SGOT, SGPT 272.4    6 WEEKS See me in 3 mos, SGOT, SGPT, CHOL PROFILE  272.4     prior

## 2010-11-19 NOTE — Progress Notes (Signed)
  Subjective:    Patient ID: Jonathan York, male    DOB: [birth year], 77 y.o.   MRN: [id number]  HPI Pt here for Comp Exam. He feels well except for right knee medial collat lig discomfort. We discussed.    Review of Systems  Constitutional: Negative for fever, chills, diaphoresis, appetite change, fatigue and unexpected weight change.  HENT: Negative for hearing loss, ear pain, tinnitus and ear discharge.   Eyes: Negative for pain, discharge and visual disturbance.       Eye exam 4/9  Respiratory: Negative for cough, shortness of breath and wheezing.   Cardiovascular: Negative for chest pain and palpitations.       Some  SOB w/ exertion improving with exercise.  Gastrointestinal: Negative for nausea, vomiting, abdominal pain, diarrhea, constipation and blood in stool.       No heartburn or swallowing problems.  Genitourinary: Negative for dysuria, frequency and difficulty urinating.       No nocturia  Musculoskeletal: Negative for myalgias, back pain and arthralgias.  Skin: Negative for rash.       No itching or dryness.  Neurological: Negative for tremors and numbness.       No tingling or balance problems.  Hematological: Negative for adenopathy. Bruises/bleeds easily: on ASA.  Psychiatric/Behavioral: Negative for dysphoric mood and agitation.       Objective:   Physical Exam  Constitutional: He is oriented to person, place, and time. He appears well-developed and well-nourished. No distress.  HENT:  Head: Normocephalic and atraumatic.  Right Ear: External ear normal.  Left Ear: External ear normal.  Nose: Nose normal.  Mouth/Throat: Oropharynx is clear and moist.  Eyes: Conjunctivae and EOM are normal. Pupils are equal, round, and reactive to light. Right eye exhibits no discharge. Left eye exhibits no discharge. No scleral icterus.  Neck: Normal range of motion. Neck supple. No thyromegaly present.  Cardiovascular: Normal rate, regular rhythm, normal heart sounds and intact  distal pulses.   No murmur heard. Pulmonary/Chest: Effort normal and breath sounds normal. No respiratory distress. He has no wheezes.  Abdominal: Soft. Bowel sounds are normal. He exhibits no distension and no mass. There is no tenderness. There is no rebound and no guarding.  Genitourinary: Rectum normal, prostate normal and penis normal. Guaiac negative stool.  Musculoskeletal: Normal range of motion. He exhibits no edema.  Lymphadenopathy:    He has no cervical adenopathy.  Neurological: He is alert and oriented to person, place, and time. Coordination normal.  Skin: Skin is warm and dry. No rash noted. He is not diaphoretic.  Psychiatric: He has a normal mood and affect. His behavior is normal. Judgment and thought content normal.          Assessment & Plan:  Medicare Wellness Exam  I have personally reviewed the Medicare Annual Wellness questionnaire and have noted 1. The patient's medical and social history 2. Their use of alcohol, tobacco or illicit drugs 3. Their current medications and supplements 4. The patient's functional ability including ADL's, fall risks, home safety risks and hearing or visual             impairment. 5. Diet and physical activities 6. Evidence for depression or mood disorders

## 2010-11-19 NOTE — Assessment & Plan Note (Signed)
Acceptable but wants to change from Vytorin due to cost. Will try Lipitor..he has been on this in the past. Lab Results  Component Value Date   CHOL 141 10/27/2010   CHOL 157 10/24/2009   CHOL 159 10/15/2008   Lab Results  Component Value Date   HDL 41.00 10/27/2010   HDL 54.90 10/24/2009   HDL 53.30 10/15/2008   Lab Results  Component Value Date   LDLCALC 83 10/27/2010   LDLCALC 83 10/24/2009   LDLCALC 92 10/15/2008   Lab Results  Component Value Date   TRIG 84.0 10/27/2010   TRIG 94.0 10/24/2009   TRIG 71.0 10/15/2008   Lab Results  Component Value Date   CHOLHDL 3 10/27/2010   CHOLHDL 3 10/24/2009   CHOLHDL 3 10/15/2008

## 2010-11-19 NOTE — Assessment & Plan Note (Signed)
Right med collat lig discomfort. Discussed approach.

## 2010-11-19 NOTE — Assessment & Plan Note (Addendum)
BP at home has been in 130s regularly. Will leave alone. Pt desires to be called White Coat Syndrome.

## 2010-11-19 NOTE — Assessment & Plan Note (Addendum)
Well controlled altho A1C higher than in the past. Discussed better adherence. HE exercises regularly. Continue. Lab Results  Component Value Date   HGBA1C 6.7* 10/27/2010

## 2010-11-19 NOTE — Assessment & Plan Note (Signed)
Stable, cont as is.

## 2010-11-20 ENCOUNTER — Encounter (HOSPITAL_BASED_OUTPATIENT_CLINIC_OR_DEPARTMENT_OTHER): Payer: Medicare Other | Admitting: Oncology

## 2010-11-20 ENCOUNTER — Ambulatory Visit (HOSPITAL_COMMUNITY)
Admission: RE | Admit: 2010-11-20 | Discharge: 2010-11-20 | Disposition: A | Discharge status: Home / Self Care | Payer: Medicare Other | Source: Ambulatory Visit | Attending: Oncology | Admitting: Oncology

## 2010-11-20 ENCOUNTER — Other Ambulatory Visit (HOSPITAL_COMMUNITY): Payer: Self-pay | Admitting: Oncology

## 2010-11-20 DIAGNOSIS — Z09 Encounter for follow-up examination after completed treatment for conditions other than malignant neoplasm: Secondary | ICD-10-CM

## 2010-11-20 DIAGNOSIS — R0602 Shortness of breath: Secondary | ICD-10-CM | POA: Insufficient documentation

## 2010-11-20 DIAGNOSIS — Z85118 Personal history of other malignant neoplasm of bronchus and lung: Secondary | ICD-10-CM

## 2010-11-20 DIAGNOSIS — C3490 Malignant neoplasm of unspecified part of unspecified bronchus or lung: Secondary | ICD-10-CM

## 2010-11-20 LAB — CBC WITH DIFFERENTIAL/PLATELET
Basophils Absolute: 0 10*3/uL (ref 0.0–0.1)
EOS%: 1.4 % (ref 0.0–7.0)
Eosinophils Absolute: 0.1 10*3/uL (ref 0.0–0.5)
HGB: 14.1 g/dL (ref 13.0–17.1)
NEUT#: 3.9 10*3/uL (ref 1.5–6.5)
RBC: 4.77 10*6/uL (ref 4.20–5.82)
RDW: 13.6 % (ref 11.0–14.6)
lymph#: 2.1 10*3/uL (ref 0.9–3.3)

## 2010-11-20 LAB — COMPREHENSIVE METABOLIC PANEL
AST: 24 U/L (ref 0–37)
Albumin: 4.4 g/dL (ref 3.5–5.2)
Alkaline Phosphatase: 40 U/L (ref 39–117)
BUN: 16 mg/dL (ref 6–23)
Calcium: 9.6 mg/dL (ref 8.4–10.5)
Chloride: 102 mEq/L (ref 96–112)
Potassium: 4.5 mEq/L (ref 3.5–5.3)
Sodium: 139 mEq/L (ref 135–145)
Total Protein: 6.8 g/dL (ref 6.0–8.3)

## 2010-11-26 ENCOUNTER — Encounter: Payer: Self-pay | Admitting: Family Medicine

## 2010-11-30 ENCOUNTER — Other Ambulatory Visit: Payer: Self-pay | Admitting: Family Medicine

## 2010-12-02 NOTE — Discharge Summary (Signed)
NAMEJAVID, Jonathan York NO.:  [id number]   MEDICAL RECORD NO.:  [id number]          PATIENT TYPE:  INP   LOCATION:  5025                         FACILITY:  [facility]   PHYSICIAN:  Vanita Panda. Magnus Ivan, M.D.DATE OF BIRTH:  1933-01-02   DATE OF ADMISSION:  03/02/2008  DATE OF DISCHARGE:  03/05/2008                               DISCHARGE SUMMARY   ADMITTING DIAGNOSES:  Severe osteoarthritis and degenerative joint  disease, left knee.   DISCHARGE DIAGNOSIS:  Severe degenerative joint disease and not  osteoarthritis, left knee.   PROCEDURES:  Left total knee arthroplasty on March 02, 2008.   HOSPITAL COURSE:  [name] is a 77 year old with known DJD and  osteoarthritis involving his left knee.  He had several conservative  treatment after worsening pain for the last year.  It was recommended  that he undergo a total knee replacement.  The risks and benefits of  surgery were explained to him at length and he agrees to proceed with  the surgery.  He was taken to the operating room on the day of admission  where he underwent a left total knee replacement without complications.  For detailed description of the operation, please refer to the dictated  operative note in the patient's medical record.  Postoperatively, he was  admitted to a regular orthopedic floor bed and progressed quite well  with therapy.  He did not sustain any type of adverse event while he was  in the hospital and his blood levels remained almost normal.  By  the  day of discharge, he was tolerating oral pain medications as well as  oral diet and was recommended he could be discharged safely to home  health physical therapy.  His INR was therapeutic as well.   DISPOSITION:  Home.   DISCHARGE INSTRUCTIONS:  While he is at home, INR would be checked  biweekly to adjust his Coumadin accordingly for a target INR of 2.0 to  3.0.  He will be given prescription for pain medication and muscle  relaxant  including Percocet and Robaxin.  He will start all of his same  home medications as before, which could be seen in the medical record  reconciliation sheet signed at discharge as well.   Follow up will be established at the office in 2 weeks.      Vanita Panda. Magnus Ivan, M.D.  Electronically Signed     CYB/MEDQ  D:  03/05/2008  T:  03/05/2008  Job:  [id number]

## 2010-12-02 NOTE — Op Note (Signed)
NAMEJEFFERY, BACHMEIER                ACCOUNT NO.:  [id number]   MEDICAL RECORD NO.:  [id number]          PATIENT TYPE:  INP   LOCATION:  5025                         FACILITY:  [facility]   PHYSICIAN:  Vanita Panda. Magnus Ivan, M.D.DATE OF BIRTH:  11/02/1932   DATE OF PROCEDURE:  03/02/2008  DATE OF DISCHARGE:                               OPERATIVE REPORT   PREOPERATIVE DIAGNOSIS:  Left knee degenerative joint  disease/osteoarthritis.   POSTOPERATIVE DIAGNOSIS:  Left knee degenerative joint  disease/osteoarthritis.   PROCEDURE:  Left total knee arthroplasty utilizing computer navigation 4  systems.   COMPONENTS:  DePuy rotating platform knee with a size 4 femoral  component, size 4 tibial component, 10-mm polyethylene insert, and 38-mm  patellar button.   SURGEON:  Vanita Panda. Magnus Ivan, MD   ASSISTANT:  Wende Neighbors, P.A.   ANESTHESIA:  1. Regional femoral block, left leg.  2. General.   ANTIBIOTICS:  1 g IV Ancef.   TOURNIQUET TIME:  One hour and 50 minutes.   BLOOD LOSS:  200 mL.   COMPLICATIONS:  None.   INDICATIONS:  Mr. Volante is a 77 year old male with worsening pain around  his left knee.  He has a known deformity with arthritis.  We tried to  temporize this with rest, antiinflammatories, medications, and  injections.  He is a very active individual and wishes to proceed with  total knee replacement surgery.  I counseled him extensively on what the  surgery involves, including the risks and benefits, as well as the risk  of DVT and fatal PE, as well as nerve and vessel injury.  The risk and  benefits well understood, and he agreed to proceed with surgery.   PROCEDURE IN DETAIL:  After informed consent was obtained, appropriate  left leg was marked, he was brought to the operating room and placed  supine on the operating table.  General anesthesia was then obtained.  Of note, femoral block was already obtained by Anesthesia after the left  knee was marked as  well on the holding room.  His left leg was prepped  and draped with DuraPrep and sterile drapes including a sterile  stockinette.  A nonsterile tourniquet had already been placed around his  thigh.  A time out was called, and he was identified as the correct  patient and correct left knee.  We then used an Esmarch to wrap out the  knee and tourniquet was inflated to 350 mm of pressure.  We then made a  midline incision directly over the patella, and I carried this down to  the tibial tubercle proximally.  I dissected the soft tissues and I just  took a medial parapatellar approach to the knee.   The knee joint was opened up, and there was mild effusion.  You could  see that there was arthritic change on the patella, the trochlear  groove, and the medial compartment of the knee.  He had almost bone-on-  bone wear on this side.  I excised the soft tissue in the knee including  the medial and lateral meniscus, the ACL, and  the PCL.  I cleaned  osteophytes from all compartments of the knee, as well as the patella.  I then removed the fat pad from the knee.   We did initiate the computer navigation portion of the case.  Through 2  small separate stab incisions in the tibia, 2 Steinmann pins were placed  in the anteromedial to posterolateral direction to the main incision.  I  placed 2 Steinmann pins in the femur, as well as the metaphyseal-  diaphyseal junction from an anteromedial to posterolateral direction.  Soft tissue arrays were then placed for the navigation portion of the  case.  We pointed out from the ankle up to the knee and even pick up  rotation of the hip with up to the knee joint.  We picked up points at  the knee joint as well as the rotation of the hip using computer  navigation.  This helped [place] to make our cuts.  First, we made a tibial  cut, taking 10 mm off the high side.  The navigation helped [place] to  correct for slope and varus and valgus.  I released soft tissue on  the  medial side, and I was able to balance the knee in flexion and  extension.  This allowed me to help make my femoral cuts.  We excised  the femur to size 4, and then I made my distal femoral cut at about 11-  mm.  We then made our chamfer cuts, and again the balanced the knee in  flexion and extension.   A femoral component was then placed and we felt this to be stable  component.  We made our drill guides for a size 4 tibial component  including for the pin of the tibia.  We were placed a trial tibial  component as well as a 10-mm polyethylene insert and a patellar, and  then drilled for a 38-mm patellar button.  With the trial components in  the knee, I put it through the range of motion and again under  navigation film, the knee to be balanced in flexion and extension, with  balancing in varus and valgus as well.  Then, I removed all components  and used pulsatile lavage to irrigate out the knee.  We then cemented in  the real size 4 DePuy rotating platform tibial tray followed by the size  4 femoral component.  I placed a 10-mm real polyethylene insert and  cemented the polyethylene patellar button in place.   Once the cement dried, I then verified again under continued navigation.  The knee was an anatomically aligned knee and the range of motion was  stable.  I then totally irrigated out the knee again with the tourniquet  let down and hemostasis was obtained.  We then placed a medium Hemovac,  closed the arthrotomy with interrupted #1 Vicryl suture, followed by 2-0  Vicryl suture in the subcuticular layer, and running 4-0 Vicryl suture  and the subcuticular layer.  Steri-Strips and Xeroform were placed  followed by well-padded dressing and a knee immobilizer.  The patient  was awakened, extubated, and taken to the recovery room in stable  condition.  All final counts were correct, and there were no  complications noted.      Vanita Panda. Magnus Ivan, M.D.  Electronically  Signed     CYB/MEDQ  D:  03/02/2008  T:  03/03/2008  Job:  [id number]

## 2010-12-02 NOTE — Letter (Signed)
February 28, 2008    Kerby Nora, MD  [address]  [place]  [place] [address]   RE:  Jonathan, York  MRN:  [id number]  /  DOB:  27-May-1933   Dear Dr. Ermalene Searing:   It was my pleasure to see Jonathan York for preoperative evaluation in the  setting of an abnormal EKG.   He is a very nice 77 year old gentleman who is scheduled for a left  total knee replacement on March 02, 2008.  He has progressive  limitation from osteoarthritis of the left knee and will undergo surgery  by Dr. Magnus Ivan.   From a cardiac standpoint, Jonathan York has been noted to have an abnormal  12-lead ECG.  He has findings suggestive of an age indeterminate  anteroseptal MI.  His tracing is otherwise within normal limits.  The  tracing I have for review is from February 23, 2008, and there is a  notation that this is unchanged from previous EKGs in 2007 and 2000, but  I do not have those tracings for review.   The patient is physically active.  He is able to walk for 2 miles after  he has had steroid injections in the knees.  He also rides stationary  bike for 30 minutes daily.  He describes mild shortness of breath with  using the elliptical machine or with jogging.  His symptoms are  unchanged over several years.  He specifically denies exertional chest  pain or pressure.  He has no orthopnea, PND, edema, lightheadedness,  palpitations, or syncope.  He denies any problems with his previous  surgeries.   PAST MEDICAL HISTORY:  1. Essential hypertension.  The patient is treated with an ACE      inhibitor.  Home blood pressure control was good with systolic      blood pressures ranging in the 120s and 130s and diastolic      pressures ranging in the 70s and 80s.  History of white-coat      hypertension noted.  2. Dyslipidemia treated with Vytorin.  3. History of lung cancer status post left upper lobectomy in 2002      with adjuvant chemotherapy at that time.  4. Remote tonsillectomy.  5. Surgery for herniated  lumbar disk.  6. Detached retina.  7. BPH.  8. Osteoarthritis predominately affecting the knees.   CURRENT MEDICATIONS:  1. Zyrtec 10 mg daily.  2. Fish oil 1 g 3 times daily.  3. Aspirin 325 mg daily.  4. Glucosamine chondroitin t.i.d.  5. Altace 10 mg daily.  6. Vytorin 10/80 mg daily.  7. Centrum multivitamin daily.  8. Avodart 0.5 mg daily.  9. Colchicine 0.6 mg as needed.   ALLERGIES:  AMOXICILLIN.   SOCIAL HISTORY:  The patient is married.  He lives in [place] and  has 3 grown children.  He has a history of smoking, but quit in 1984.  No history of alcohol use.  The patient is retired since 2002.  He is  physically active as outlined in the H and P.  He works out at [employer]  regularly and also does daily walking of approximately 2 miles.   FAMILY HISTORY:  There is no history of coronary artery disease or  peripheral arterial disease.  His mother died at age 1 of old age.  Father died at age 79 from emphysema.  He has 1 brother and 1 sister who  are both alive and well.   REVIEW OF  SYSTEMS:  A complete 12-point review of systems was performed.  All systems were negative except as outlined above.   PHYSICAL EXAMINATION:  GENERAL:  The patient is alert and oriented.  He  is in no acute distress.  He is a very pleasant, age-appropriate white  male.  VITAL SIGNS:  Weight is 238 pounds, blood pressure 150/88, heart rate  78, and respiratory rate 16.  HEENT:  Normal.  NECK:  Normal carotid upstrokes without bruits.  JVP normal.  No  thyromegaly or thyroid nodules.  LUNGS:  Clear bilaterally.  HEART:  The apex is discreet, nondisplaced.  No right ventricular heave  or lift.  Heart is regular rate and rhythm without murmurs or gallops.  ABDOMEN:  Soft, nontender, no organomegaly.  No abdominal bruits.  BACK:  No CVA tenderness.  EXTREMITIES:  Femoral pulses are 2+.  There are no bruits.  Dorsalis  pedis and posterior tibial pulses are 2+.  There is no edema.  SKIN:   Warm and dry without rash.  NEUROLOGIC:  Cranial nerves II-XII are intact.  Strength intact and  equal bilaterally.   EKG shows normal sinus rhythm with a heart rate of 63 beats per minute.  Age indeterminate and anteroseptal infarct.  No significant ST-segment  or T-wave changes.   ASSESSMENT:  This is a 77 year old gentleman with underlying  hypertension and dyslipidemia who has an abnormal resting ECG suggestive  of an old anteroseptal myocardial infarction.  I think there is a high  probability that this is a normal variant EKG.  The patient has no  cardiovascular symptoms whatsoever.  He has an excellent functional  capacity and his perioperative cardiac risk is very low.  I think 2-D  echocardiogram would answer this question definitively.  This has been  arranged to be done in our [place] office tomorrow.  We will be in  touch with the patient as soon as the results are available and I would  be happy to see him if there are any problems in the perioperative  period.  I suspect he will do well with surgery and I would not  recommend any changes to his medical regimen as his risk factors appear  to be under very good control.   Thanks again for the opportunity to see Jonathan York.  Please feel free to  call at any time with questions regarding his care.    Sincerely,      Veverly Fells. Excell Seltzer, MD  Electronically Signed    MDC/MedQ  DD: 02/28/2008  DT: 02/29/2008  Job #: [id number]   CC:    Vanita Panda. Magnus Ivan, M.D.

## 2010-12-05 NOTE — Procedures (Signed)
[place]  Patient:    Jonathan York, Jonathan York                          MRN: [id number] Proc. Date: 02/21/01 Adm. Date:  [id number] Attending:  Gailen Shelter                           Procedure Report  DATE OF BIRTH:  1932-10-24.  PROCEDURE:  Video bronchoscopy.  CLINICAL NOTE:  This is a 77 year old Caucasian male who was evaluated by Dr. Shan Levans for abnormal chest x-ray showing a density on the left upper lobe and lingular areas.  The appearance of these lesions on CT scan is somewhat inflammatory but because of the irregular nature of the same, bronchoscopy was warranted.  The patient has a remote smoking history.  The patient had the procedure explained to him with the potential benefits, limitations, and potential complications discussed.  The patient agreed to proceed.  DESCRIPTION OF PROCEDURE:  The patient had the posterior pharynx anesthetized with Cetacaine.  The medication given was fentanyl 50 mcg IV as well as Versed 5 mg IV.  Good conscious sedation was achieved with the patient maintaining good vital signs and saturations during the procedure.  The patient did have one slight desaturation to 86%, which responded to increasing of the L2 flow during procedure.  The Olympus video scope was then advanced via the oropharynx, and the vocal cords were normal.  Instrument was then passed through the vocal cords. Trachea was normal.  Carina was sharp.  The instrument was then advanced to the right main bronchus and attention was placed to the right upper lobe, which showed inflammation and some heme noted at the mucosal surfaces along with mucus plugs.  No endobronchial lesions were noted.  The right middle lobe and right lower lobe were then inspected with no endobronchial lesions noted and no inflammatory changes.  Bronchoscope was then retrieved to the carina level again and advanced to the left main.  Attention was placed to  the left upper lobe and lingula segments, and heme could be seen at the mucosal level with inflammation and numerous mucus plugs, and these were cleared.  No endobronchial lesions were noted. Left lower lobe subsegments were all free of endobronchial lesions.  The procedure was terminated after washings were obtained of both right and left upper lobes for culture and cytology.  The lesion in question noted on CT scan and very faintly on x-ray was not amenable for fluoroscopy.  Patient may require a fine needle aspirate in the future.  The patient tolerated the procedure well.  He was sent to the recovery area.  IMPRESSION:  Inflammatory-appearing infiltrates on CT and chest x-ray, cannot exclude carcinoma.  Await pathology and cytology.  PLAN:  As noted above.  Patient also has follow-up with Dr. Shan Levans for further determination of need of further procedures _____. DD:  02/21/01 TD:  02/21/01 Job: [id number]

## 2010-12-05 NOTE — Consult Note (Signed)
[place]. [facility]  Patient:    Jonathan York, Jonathan York Visit Number: [id number] MRN: [id number]          Service Type: SUR Location: 2000 2024 01 Attending Physician:  Charlett Lango Dictated by:   Rosemarie Ax, N.P. Proc. Date: 03/18/01 Admit Date:  03/15/2001 Discharge Date: 03/19/2001   CC:         Salvatore Decent. Dorris Fetch, M.D.  Danice Goltz, M.D.  Laurita Quint, M.D. [facility]  Luisa Hart E. Delford Field, M.D. [facility]   Consultation Report  DATE OF BIRTH:  April 09, 1933.  REASON FOR CONSULTATION:  Lung mass.  REFERRING PHYSICIAN:  Salvatore Decent. Dorris Fetch, M.D.  HISTORY OF PRESENT ILLNESS:  This is a 77 year old [place] man noted to have a shadow on a chest x-ray during a routine yearly exam in February 2002 by Dr. Laurita Quint.  He was re-x-rayed in June 2002, and a left upper lung mass was seen.  At that time he was referred to Dr. Shan Levans.  CT on February 10, 2001, revealed:  (1) A spiculated mass in the left upper lobe suspicious for bronchogenic carcinoma.  (2) Right adrenal nodule; and (3) Small attenuated lesion within the dome of the right hepatic lobe, too small to characterize.  An MRI on February 28, 2001, of the abdomen revealed a 1 cm lesion in the right adrenal, most likely an adenoma.  A discrete liver nodule was not observed with MRI.  Bronchial washings on August 5 were negative for malignant cells, pathology report #[id number].  On August 27, [name] underwent a left thoracotomy by Dr. Charlett Lango, a left upper lobectomy, and mediastinal lymph node sampling.  Pathology report [id number] revealed:  (1) A nonmucinous bronchoalveolar carcinoma, 1.0 cm, invading the visceral pleura.  (2) a 2.5 cm nonmucinous bronchoalveolar carcinoma.  Bronchial and pleural margins were negative for tumor.  Vascular margins were negative for tumor.  There was no lymphovascular invasion identified.  There was _____  adenomatous hyperplastic nodule in the adjacent lung tissue.  Three benign hilar lymph nodes were negative for tumor.  (3) Dissection of the level 2 lymph node revealed one benign lymph node, negative for metastatic tumor.  Remaining lymph nodes were negative.  Grade I/III.  Pleural involvement of 17 nodes examined, none were positive.  10-M code:  PT4, PN0, PMX.  PAST MEDICAL HISTORY:  Positive for hypertension, hyperlipidemia, osteoarthritis, and lipomas of the right shoulder.  PAST SURGICAL HISTORY: 1. Cataract removal on the right. 2. Deviated septum is remote. 3. Hemorrhoidectomy, remote. 4. Retinal detachment on the right. 5. Lipoma removed from the chest. 6. Foreign body removed from right eye x 2. 7. Arthroscopic surgery, right knee.  ALLERGIES:  No known drug allergies.  FAMILY HISTORY:  Mother died at 86 of complications of diabetes mellitus and hypertension.  Father died at 56 of emphysema.  One brother, 70, is alive and well, and one sister at 63 has thyroid problems and COPD.  SOCIAL HISTORY:  [name] has been married to his wife, Jonathan York, for 39 years. They live in [place] and have three sons.  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  He has had no alcohol use for three years.  He has been a heavy drinker in the past.  He has a 30 pack-year smoking history but quit 18 years ago.  His wife is a Engineer, civil (consulting) with [employer].  MEDICATIONS:  Zocor 20 mg one p.o. q.d., Altace 10 mg  one p.o. q.d., glucosamine/chondroitin daily, Vioxx 25 mg p.r.n. knee pain, MVI one p.o. q.d., ASA 325 mg one p.o. q.d., HCTZ 25 mg one p.o. q.d.  REVIEW OF SYSTEMS:  The patient does have shortness of breath with activity. He denies any night sweats, headaches, or vision changes.  He does have yearly exams.  He denies any chest pain, pleuritic pain, or palpitations.  He has had no nausea and vomiting or indigestion.  He has had GERD in the past, but this stopped with  discontinuance of alcohol use.  He has had frequency but no urgency or dysuria.  He has a history of lipomas.  Currently he is having some constipation.  PHYSICAL EXAMINATION:  VITAL SIGNS:  Temperature is 97, pulse is 82, respirations 20, blood pressure 148/78.  GENERAL:  This is a pleasant 77 year old white male lying in bed smiling.  HEENT:  Normocephalic.  Sclerae are clear and anicteric.  PERRLA.  EOMs intact. Mucous membranes are moist.  Oropharynx is clear without plaques or lesions.  NODES/NECK:  No discrete nodes notable.  Asymmetry of neck with fullness over left clavicle.  No axillary or inguinal adenopathy appreciated.  CHEST:  Decreased breath sounds left upper lobe, wheezing left upper lobe, wheezing right lung field.  CARDIAC:  Regular rate and rhythm, no murmur, no gallop.  ABDOMEN:  Firm, somewhat distended, positive bowel sounds throughout.  EXTREMITIES:  Lipoma, right shoulder.  Nail beds slightly cyanotic.  No clubbing, no edema.  NEUROLOGIC:  Alert and oriented x 3.  Cranial nerves II-XII intact.  DTRs 2+. Strength is 5/5.  ASSESSMENT AND PLAN:  This is a 77 year old white male, status post left upper lobectomy, with nonmucinous bronchoalveolar carcinoma.  Complete staging will be per Dr. Kimberlee Nearing.  Thank you very much for this consultation.  The patient will be seen and examined by Dr. Arline Asp. Dictated by:   Rosemarie Ax, N.P. Attending Physician:  Charlett Lango DD:  03/25/01 TD:  03/25/01 Job: [id number]

## 2010-12-05 NOTE — Op Note (Signed)
[place]. [facility]  Patient:    RAMAR, NOBREGA Visit Number: [id number] MRN: [id number]          Service Type: SUR Location: 2000 2024 01 Attending Physician:  Charlett Lango Proc. Date: 03/15/01 Adm. Date:  03/15/2001   CC:         Charlcie Cradle. Delford Field, M.D. [facility]  ROBERT Liana Crocker, MD   Operative Report  PREOPERATIVE DIAGNOSIS:  Two left upper lobe nodules.  POSTOPERATIVE DIAGNOSIS:  Adenocarcinoma with bronchoalveolar features.  OPERATION PERFORMED:  Left video assisted thoracoscopic surgery, wedge resection of left upper lobe nodule, left thoracotomy, left upper lobectomy, mediastinal lymph node sampling.  SURGEON:  Salvatore Decent. Dorris Fetch, M.D.  ASSISTANT:  Areta Haber, P.A.  ANESTHESIA:  General.  OPERATIVE FINDINGS:  Two left upper lobe nodules, the first peripheral puckering of the viscera pleura.  Wedge resection frozen section revealed adenocarcinoma.  Left upper lobe performed, second nodule deeper within parenchyma.  Frozen section also revealed adenocarcinoma.  Grossly normal-appearing lymph nodes, bronchial margins negative for tumor.  INDICATIONS FOR PROCEDURE:  The patient is a 77 year old gentleman with a history of remote tobacco abuse.  On a routine physical examination recently, he was found to have a nodule on his left upper lobe.  He underwent a CT scan which revealed there were actually two nodules in close proximity, one peripheral just below the pleural surface and one just inferior to that that was deeper within the parenchyma.  There was no mediastinal or hilar adenopathy and no liver or adrenal metastases.  He underwent a video bronchoscopy with transbronchial biopsy and washings which were negative.  He now is referred for surgical biopsy and definitive treatment.  The indications, risks, benefits and alternatives of the procedure were discussed in detail with Mr. Dymek.  He understood the operative plan of  initial VATS with wedge resection and then possible thoracotomy and lobectomy depending on the results of the wedge resection.  He understood and accepted the risks and agreed to proceed.  DESCRIPTION OF PROCEDURE:  Mr. Gehlhausen was brought to the preop holding area on March 15, 2001.  Lines were placed to monitor arterial blood pressure as well as central venous access and peripheral intravenous access.  The patient was taken to the operating room, anesthetized and intubated with a double lumen endotracheal tube.  Tube position was confirmed with bronchoscopy.  I performed bronchoscopy and examined the bronchial tree to the level of the subsegmental bronchi with no endobronchial lesions.  Dr. Gypsy Balsam from anesthesia confirmed tube position.  PAS hose were placed for DVT prophylaxis.  A Foley catheter was placed.  Intravenous antibiotics were administered.  The patient then was placed in a right lateral decubitus position and the left chest was prepped and draped in the usual fashion.  Single lung ventilation was carried out ventilating the right lung only.  The left chest was prepped and draped in the usual fashion.  A transverse incision was made in approximately the 8th intercostal space in the midaxillary line.  This was carried through the skin and subcutaneous tissue. The chest then was entered using a hemostat.  A port was inserted and the thoracoscope was inserted.  There was inadequate deflation of the lung.  The double lumen endotracheal tube was adjusted and then there was good isolation of the left lung.  Exploration of the chest revealed no effusions, no visceral or parietopleural implants.  There were no adhesions.  The left upper lobe was inspected.  There was irregularity of the visceral pleura at the site of the most peripheral lung nodule near the apex.  Additional ports were placed through incisions in the fifth intercostal space anteriorly and posteriorly for  instrumentation.  The area in question was palpated and there was a lung nodule at this area.  There was a firm mass that was nonmobile.  Using an ATB-35 stapler, a wedge resection was performed of this area.  Multiple firings of the staple were required.  The specimen then was removed and sent for frozen section.  Frozen section returned adenocarcinoma with bronchoalveolar features.  Given the primary nature of the lung cancer, decision was made to proceed with lobectomy.  A posterolateral thoracotomy was performed.  The skin and subcutaneous tissue were divided.  Hemostasis was achieved with electrocautery.  Latissimus muscle was divided.  The serratus muscle was spared and was mobilized and retracted anteriorly.  The fifth interspace was identified and the chest was entered through the fifth interspace.  The fifth rib was shingled posteriorly to allow improved access.  A retractor was placed.  Inspection of the left upper lobe revealed the second nodule to be just inferior to the first.  There was no involvement of the visceral pleura.  There was no significant adenopathy within the chest.  The inferior pulmonary ligament was divided and and an inferior pulmonary ligament lymph node was taken as a separate specimen and sent for permanent section.  The pleural reflection then was divided circumferentially around the hilum exposing the PA as well as the inferior and superior pulmonary veins.  The fissure was nearly complete and the pulmonary artery was dissected out within the fissure.  The lingular branches were identified and divided with an ATW-35 stapler.  A small accessory upper lobe branch was identified and separately ligated with 2-0 silk ties.  At this point attention was turned medially to the superior pulmonary vein.  The overlying tissue was dissected free and a right angle clamp was gently passed around the pulmonary vein.  It then was divided with an ATW-35  vascular  stapler.  At this point the fissure between the lingula and the left lower lobe was completed with two firings of an ATB-35 stapler.  The proximal upper lobe pulmonary arterial branches then were identified and dissected free and divided with an ATW-35 mm stapler.  At this point the left upper lobe bronchus was then dissected out.  There were multiple lymph nodes which were grossly normal-appearing along the bronchus. These were swept upward and taken with the specimen.  A TL30 stapler was placed across the left upper lobe bronchus and tightened.  A test inflation was performed.  There was good inflation of the left lower lobe and no inflation of the left upper lobe.  The stapler then was fired.  The specimen was removed.  The bronchial stump was inspected and there was good approximation and good placement of the staples.  The specimen was sent for frozen sections.  The margins were clear of tumor.  The second nodule also was an adenocarcinoma.  Systematic inspection then was carried out and multiple lymph nodes were taken.  The subcarinal space was explored and a large but grossly normal-appearing lymph node was identified.  Two lymph nodes were taken from this region.  Level 5 and level 4L lymph node also were sampled.  Again, these appeared grossly normal.  Hemostasis was achieved.  All staple lines were inspected.  There was good hemostasis at all staple  lines.  The chest was filled with 1L of warm normal saline containing 1 gm of vancomycin.  A chest inflation to 30 cmH2O revealed minimal leakage from along the fissure staple line.  There was no leakage from the bronchial stump.  A 36 French right angle chest tube was placed through a separate incision and directed posteriorly.  A 28 French chest tube was directed to the apex anteriorly.  Both chest tubes were secured with #1 silk sutures.  The wound then was closed with #2 Vicryl pericostal sutures, the serratus  was reattached laterally with a running 0 Vicryl suture, the latissimus fascia was closed with running 0 Vicryl suture, the subcutaneous tissue was closed with running 2-0 Vicryl suture and the skin was closed with 3-0 Vicryl subcuticular suture.  Sponge, needle and instrument counts were correct at the end of the procedure.  There were no intraoperative complications.  An epidural catheter was placed by Dr. Noreene Larsson of anesthesia at the completion of the procedure. The patient then was extubated and taken to the recovery room in stable condition. Attending Physician:  Charlett Lango DD:  03/15/01 TD:  03/16/01 Job: [id number]

## 2010-12-05 NOTE — Discharge Summary (Signed)
[place]. [facility]  Patient:    Jonathan York, Jonathan York Visit Number: [id number] MRN: [id number]          Service Type: SUR Location: 2000 2024 01 Attending Physician:  Charlett Lango Dictated by:   Areta Haber, Rochel Brome. Admit Date:  03/15/2001   CC:         Salvatore Decent. Dorris Fetch, M.D.  Charlcie Cradle Delford Field, M.D. [facility]  Laurita Quint, M.D. [facility]   Discharge Summary  DATE OF BIRTH:  2033-06-12  HISTORY OF PRESENT ILLNESS:  This is a 77 year old male seen in consultation by Salvatore Decent. Dorris Fetch, M.D., secondary to a left upper lobe nodule.  The patient has a history of tobacco abuse, having quit in 1984.  Recently he underwent routine physical examination in February and coupled with that he had a PA and lateral chest x-ray which revealed a density in the left mid lung zone.  He was noted to have no significant respiratory symptoms at that time. A chest x-ray four months later showed no significant change.  Subsequently a CT scan of the chest was performed and this revealed a spiculated mass in the left upper lobe near the pleural surface.  There were actually two masses, one was 2 x 2.5 cm and the other one was 1.7 cm.  Both had findings concerning for primary bronchogenic carcinoma.  Additionally, he was found to have a 1 cm right adrenal nodule and this was followed up by MRI and was consistent with adrenal adenoma.  He was referred to [facility] E. Delford Field, M.D., for evaluation of his lung nodule.  He underwent a video bronchoscopy by Danice Goltz, M.D. Transbronchial biopsies were performed.  No malignant cells were identified. There was some acute inflammation present.  Bronchial washings were negative for acid-fast bacilli, fundus, yeast, and bacteria, as well as negative for malignancy by cytology.  Subsequently he was referred to [facility] C. Dorris Fetch, M.D., for surgical biopsy and possible further intervention dependent on pathology findings in the  operating room.  He was admitted to the hospital for this procedure.  PAST MEDICAL HISTORY:  Significant for hypertension and hypercholesterolemia. He also has a history of eye surgery x 3, a recent history of right knee arthroscopy a couple of weeks ago, and also has had a repair of a deviated septum and also hemorrhoidectomy.  MEDICATIONS ON ADMISSION: 1. Zocor 20 mg q.d. 2. Altace 10 mg q.d. 3. Hydrochlorothiazide 25 mg q.d. 4. Aspirin 325 mg q.d.  ALLERGIES:  No known drug allergies.  FAMILY HISTORY:  Please the history and physical done at the time of admission.  SOCIAL HISTORY:  Please the history and physical done at the time of admission.  REVIEW OF SYSTEMS:  Please the history and physical done at the time of admission.  PHYSICAL EXAMINATION:  Please the history and physical done at the time of admission.  HOSPITAL COURSE:  The patient was admitted elective on March 15, 2001, and was taken to the operating room where he underwent the following procedure: Left video-assisted thoracoscopic surgery with wedge resection of the left upper lobe nodule, left thoracotomy, left upper lobectomy, and mediastinal ______.  The procedure was performed by Viviann Spare C. Dorris Fetch, M.D.  The patient tolerated the procedure well and was taken to the post anesthesia care unit in stable condition.  POSTOPERATIVE HOSPITAL COURSE:  The patient has done well.  He initially had a very small apical pneumothorax and leak in the chest tube system.  This has resolved and  chest tubes have been discontinued in a routine manner. Additionally, he has remained hemodynamically stable.  Oxygen has been weaned and he has tolerated an excellent response to pulmonary toilet.  He has tolerated a rapid advancement in his activity level quite well.  His incision is healing well without signs of infection.  Laboratory values have remained stable.  His pathology at the time of surgery was consistent with  possible adenocarcinoma.  The final pathology is currently pending to the chart.  An oncology consultation will be obtained prior to discharge and at this time it is currently pending.  They will advise as to oncology follow-up.  Overall he is felt to be quite stable from a surgical viewpoint and tentatively will be discharged the morning of March 19, 2001, pending morning round re-evaluation.  FINAL DIAGNOSES: 1. Left upper lobe mass with frozen section showing probable adenocarcinoma    with the final pathology pending. 2. Hypertension. 3. Hypercholesterolemia. 4. Previous eye surgery x 3. 5. Previous right knee arthroscopy. 6. Previous repair of a deviated septum. 7. Previous hemorrhoidectomy.  MEDICATIONS ON DISCHARGE:  As preoperatively.  Additionally, Tylox one to two q.4-6h. p.r.n. as needed for pain.  FOLLOW-UP:  Suture removal at the CVTS office on March 22, 2001, at 9:30 a.m.  The patient will see Salvatore Decent. Dorris Fetch, M.D., on March 30, 2000, at 11:15 a.m.  Oncology follow-up will be determined at a future date. He will also follow up with his primary care and pulmonary physicians as they request.  DISCHARGE INSTRUCTIONS:  The patient received written instructions in regard to medications, activity, diet, wound care, and follow-up. Dictated by:   Areta Haber, Rochel Brome. Attending Physician:  Charlett Lango DD:  03/18/01 TD:  03/18/01 Job: [id number]

## 2010-12-05 NOTE — Op Note (Signed)
NAMEJERMAR, COLTER NO.:  [id number]   MEDICAL RECORD NO.:  [id number]          PATIENT TYPE:  INP   LOCATION:  5023                         FACILITY:  [facility]   PHYSICIAN:  Hilda Lias, M.D.   DATE OF BIRTH:  October 29, 1932   DATE OF PROCEDURE:  03/05/2006  DATE OF DISCHARGE:                                 OPERATIVE REPORT   SURGEON:  Hilda Lias, M.D.   ASSISTANT:  Cristi Loron, M.D.   PREOPERATIVE DIAGNOSIS:  Right L4-L5 stenosis.   POSTOPERATIVE DIAGNOSIS:  Right L4-L5 herniated disk, with a fragment  compromising the L4 nerve root.   PROCEDURE:  Right L4-L5 laminotomy, removal of large fragment compromising  the L4 nerve root, total gross diskectomy, foraminotomy, microscopy.   CLINICAL HISTORY:  The patient was seen by me in my office yesterday with  acute onset of right leg pain.  The patient came in a wheelchair.  He was  unable to move because of the pain.  MRI shows stenosis.  Because of the  history, we were suspicious of a fragment or something else, because the  patient has a history of carcinoma.  Because of the findings clinically, he  was brought to the hospital for surgery.   PROCEDURE:  The patient was taken to the OR, and she was positioned in a  prone manner.  The back was prepped with DuraPrep.  A midline incision  between L4-L5 was made.  The x-rays showed 1 clamp at the level of L3-L4.  From there on, we went down and identified the L4-5 space.  The muscles were  retracted laterally.  We brought the microscope into the area.  Because we  had no idea what we were going to find, we proceeded with a wide laminotomy,  removing the lamina of L4 and the upper part of L5.  A thick yellow ligament  was also excised.  We retracted the thecal sac, and, indeed, there was a  large fragment compromising the L4 nerve root.  Removal was made.  We looked  at the disk space, and there was a large opening.  Because of that, we  entered the  disk space and total diskectomy dorsally, medially and laterally  was accomplished.  Then, we proceeded with foraminotomy to decompress the L4-  L5 nerve root.  At the end, we had a good decompression.  The area was  irrigated.  Valsalva maneuver was negative.  Phentermine and Depo-Medrol  were left in the epidural space, and the wound was closed with Vicryl and  Steri-Strips.           ______________________________  Hilda Lias, M.D.     EB/MEDQ  D:  03/05/2006  T:  03/05/2006  Job:  [id number]

## 2010-12-05 NOTE — Procedures (Signed)
[place]. [facility]  Patient:    Jonathan York, Jonathan York Visit Number: [id number] MRN: [id number]          Service Type: SUR Location: 2300 2309 01 Attending Physician:  Charlett Lango Dictated by:   Guadalupe Maple, M.D. Proc. Date: 03/15/01 Adm. Date:  03/15/2001                             Procedure Report  PROCEDURE: Thoracic epidural catheter placement for postoperative pain control.  ANESTHESIOLOGIST: Guadalupe Maple, M.D.  INDICATIONS FOR PROCEDURE: Mr. Deanta Mincey is a 77 year old white male, with a history of carcinoma of the left upper lobe of the lung.  He underwent a left thoracotomy and left upper lobectomy by Dr. Dorris Fetch under general anesthesia.  Prior to the procedure Dr. Bedelia Person discussed pain control options with the patient and it was agreed to proceed with epidural pain control following surgery.  DESCRIPTION OF PROCEDURE: Upon completion of the procedure, while the patient was still under general endotracheal anesthesia and in the right lateral decubitus position, the mid thoracic area was prepped and draped sterilely. Two attempts at the T5-6 interspace and one attempt at the T6-7 interspace were unsuccessful due to inability to pass the needle between the bony lamina. The T8-9 interspace was entered in the midline using an 18 gauge Tuohy needle. Loss of resistance technique with air was employed and aspiration of the needle was negative for blood or CSF.  Preservative-free saline 10 cc, to which was added 100 mcg of Fentanyl, was injected through the needle without difficulty.  The catheter was then threaded 4 cm into the epidural space and the needle removed without difficulty.  The catheter was then taped securely in place, and the patient subsequently turned to the supine position, extubated, and brought to the recovery room in stable condition.  He will be begun on epidural Fentanyl and Marcaine infusion, and followed on a  daily basis by the anesthesia service.  He tolerated the procedure well without apparent complications. Dictated by:   Guadalupe Maple, M.D. Attending Physician:  Charlett Lango DD:  03/15/01 TD:  03/16/01 Job: [id number]

## 2010-12-31 ENCOUNTER — Other Ambulatory Visit (INDEPENDENT_AMBULATORY_CARE_PROVIDER_SITE_OTHER): Payer: Medicare Other

## 2010-12-31 DIAGNOSIS — E785 Hyperlipidemia, unspecified: Secondary | ICD-10-CM

## 2010-12-31 LAB — ALT: ALT: 23 U/L (ref 0–53)

## 2011-01-28 ENCOUNTER — Other Ambulatory Visit: Payer: Self-pay | Admitting: Family Medicine

## 2011-02-23 ENCOUNTER — Other Ambulatory Visit (INDEPENDENT_AMBULATORY_CARE_PROVIDER_SITE_OTHER): Payer: Medicare Other | Admitting: Family Medicine

## 2011-02-23 DIAGNOSIS — E785 Hyperlipidemia, unspecified: Secondary | ICD-10-CM

## 2011-02-23 LAB — LIPID PANEL
HDL: 48 mg/dL (ref >39.00)
Total CHOL/HDL Ratio: 3
Triglycerides: 61 mg/dL (ref 0.0–149.0)
VLDL: 12.2 mg/dL (ref 0.0–40.0)

## 2011-02-23 LAB — AST: AST: 18 U/L (ref 0–37)

## 2011-03-04 ENCOUNTER — Encounter: Payer: Self-pay | Admitting: Family Medicine

## 2011-03-04 ENCOUNTER — Ambulatory Visit (INDEPENDENT_AMBULATORY_CARE_PROVIDER_SITE_OTHER): Payer: Medicare Other | Admitting: Family Medicine

## 2011-03-04 DIAGNOSIS — I10 Essential (primary) hypertension: Secondary | ICD-10-CM

## 2011-03-04 DIAGNOSIS — E785 Hyperlipidemia, unspecified: Secondary | ICD-10-CM

## 2011-03-04 NOTE — Assessment & Plan Note (Signed)
Stable. No change changing from Vytorin to Lipitor. Still good control altho would be great to get LDL to 70 instead of 80s. Cont weight loss. Lab Results  Component Value Date   CHOL 148 02/23/2011   CHOL 141 10/27/2010   CHOL 157 10/24/2009   Lab Results  Component Value Date   HDL 48.00 02/23/2011   HDL 41.00 10/27/2010   HDL 54.90 10/24/2009   Lab Results  Component Value Date   LDLCALC 88 02/23/2011   LDLCALC 83 10/27/2010   LDLCALC 83 10/24/2009   Lab Results  Component Value Date   TRIG 61.0 02/23/2011   TRIG 84.0 10/27/2010   TRIG 94.0 10/24/2009   Lab Results  Component Value Date   CHOLHDL 3 02/23/2011   CHOLHDL 3 10/27/2010   CHOLHDL 3 10/24/2009   No results found for this basename: LDLDIRECT

## 2011-03-04 NOTE — Patient Instructions (Signed)
Pls schedule with new doctor for Comp Exam and labs prior in Apr.

## 2011-03-04 NOTE — Assessment & Plan Note (Signed)
Better control today. Cont curr meds and cont weight loss efforts! BP Readings from Last 3 Encounters:  03/04/11 128/60  11/19/10 156/74  07/03/10 158/74

## 2011-03-04 NOTE — Progress Notes (Signed)
  Subjective:    Patient ID: Jonathan York, male    DOB: [birth year], 77 y.o.   MRN: [id number]  HPI Pt here for lipid recheck. He is diabetic with previously well controlled DM and lipids when on Vytorin but due to costs wanted to switch. We started Lipitor last time three months ago. His liver function has remained nml. He has tolerated the Lipitor without difficulty. He feels great. He has lost 20+ pounds since the beginning of the year and is hopeful of losing more. He goes to the gym regularly. He has a cruise coming up. He lost weight on his last cruise.    Review of Systems  Constitutional: Negative for fever, chills, diaphoresis, activity change, appetite change and fatigue.  HENT: Negative for hearing loss, ear pain, congestion, sore throat, rhinorrhea, neck pain, neck stiffness, postnasal drip, sinus pressure, tinnitus and ear discharge.   Eyes: Negative for pain, discharge and visual disturbance.  Respiratory: Negative for cough, shortness of breath and wheezing.   Cardiovascular: Negative for chest pain and palpitations.       No SOB w/ exertion  Gastrointestinal:       No heartburn or swallowing problems.  Genitourinary:       No nocturia  Skin:       No itching or dryness.  Neurological:       No tingling or balance problems.  All other systems reviewed and are negative.       Objective:   Physical Exam  Constitutional: He appears well-developed and well-nourished. No distress.  HENT:  Head: Normocephalic and atraumatic.  Right Ear: External ear normal.  Left Ear: External ear normal.  Nose: Nose normal.  Mouth/Throat: Oropharynx is clear and moist.  Eyes: Conjunctivae and EOM are normal. Pupils are equal, round, and reactive to light. Right eye exhibits no discharge. Left eye exhibits no discharge.  Neck: Normal range of motion. Neck supple.  Cardiovascular: Normal rate and regular rhythm.   Pulmonary/Chest: Effort normal and breath sounds normal. He has no wheezes.   Lymphadenopathy:    He has no cervical adenopathy.  Skin: He is not diaphoretic.          Assessment & Plan:

## 2011-03-30 ENCOUNTER — Other Ambulatory Visit: Payer: Self-pay | Admitting: Family Medicine

## 2011-04-17 LAB — CBC
HCT: 45.3
MCHC: 33.6
MCV: 90
Platelets: 183
WBC: 6.9

## 2011-04-17 LAB — BASIC METABOLIC PANEL
BUN: 11
CO2: 28
Chloride: 107
Glucose, Bld: 102 — ABNORMAL HIGH
Potassium: 4.4

## 2011-04-17 LAB — GLUCOSE, CAPILLARY: Glucose-Capillary: 136 — ABNORMAL HIGH

## 2011-05-27 ENCOUNTER — Ambulatory Visit (INDEPENDENT_AMBULATORY_CARE_PROVIDER_SITE_OTHER): Payer: Medicare Other

## 2011-05-27 DIAGNOSIS — Z23 Encounter for immunization: Secondary | ICD-10-CM

## 2011-07-31 ENCOUNTER — Telehealth: Payer: Self-pay | Admitting: Oncology

## 2011-07-31 NOTE — Telephone Encounter (Signed)
Pt called for 2013 appt.  Per mosaiq it is due may,appt made for 11/19/11 pt aware   aom

## 2011-08-02 ENCOUNTER — Other Ambulatory Visit: Payer: Self-pay | Admitting: Family Medicine

## 2011-08-25 ENCOUNTER — Encounter: Payer: Self-pay | Admitting: Family Medicine

## 2011-08-25 ENCOUNTER — Ambulatory Visit (INDEPENDENT_AMBULATORY_CARE_PROVIDER_SITE_OTHER): Payer: Medicare Other | Admitting: Family Medicine

## 2011-08-25 VITALS — BP 152/82 | HR 82 | Temp 98.2°F | Wt 233.8 lb

## 2011-08-25 DIAGNOSIS — N400 Enlarged prostate without lower urinary tract symptoms: Secondary | ICD-10-CM

## 2011-08-25 MED ORDER — TAMSULOSIN HCL 0.4 MG PO CAPS: 0.4000 mg | ORAL_CAPSULE | Freq: Every day | ORAL | Status: DC

## 2011-08-25 NOTE — Progress Notes (Signed)
Inc in nocturia.  Frequency during the day.  H/o BPH.  Worse in last month.  No burning with urination.  No FCNAVD.    Meds, vitals, and allergies reviewed.   ROS: See HPI.  Otherwise, noncontributory.  PSA 1.5--->1.8 in last 10 months.    nad ncat rrr ctab Prostate gland firm and smooth, mild symmetric enlargement, but no nodularity, tenderness, mass, asymmetry or induration.

## 2011-08-25 NOTE — Patient Instructions (Signed)
You can get your results through our phone system.  Follow the instructions on the blue card. Start the flomax and see if that helps.  Cut back on caffeine.

## 2011-08-26 ENCOUNTER — Encounter: Payer: Self-pay | Admitting: Family Medicine

## 2011-08-26 LAB — PSA, MEDICARE: PSA: 1.81 ng/mL (ref <=4.00)

## 2011-08-26 NOTE — Assessment & Plan Note (Signed)
PSA okay, start flomax and call back as needed.  He agrees.

## 2011-10-01 ENCOUNTER — Telehealth: Payer: Self-pay | Admitting: Oncology

## 2011-10-01 NOTE — Telephone Encounter (Signed)
called the pt and moved appt from 5/2 to  5/10   aom(to allow rm for chemo pt)

## 2011-10-22 ENCOUNTER — Other Ambulatory Visit: Payer: Self-pay | Admitting: Family Medicine

## 2011-10-22 DIAGNOSIS — E119 Type 2 diabetes mellitus without complications: Secondary | ICD-10-CM

## 2011-10-26 ENCOUNTER — Other Ambulatory Visit (INDEPENDENT_AMBULATORY_CARE_PROVIDER_SITE_OTHER): Payer: Medicare Other

## 2011-10-26 DIAGNOSIS — E119 Type 2 diabetes mellitus without complications: Secondary | ICD-10-CM

## 2011-10-26 LAB — COMPREHENSIVE METABOLIC PANEL
Albumin: 4.3 g/dL (ref 3.5–5.2)
Alkaline Phosphatase: 45 U/L (ref 39–117)
BUN: 19 mg/dL (ref 6–23)
Calcium: 9.2 mg/dL (ref 8.4–10.5)
Chloride: 105 mEq/L (ref 96–112)
Glucose, Bld: 123 mg/dL — ABNORMAL HIGH (ref 70–99)
Potassium: 4.2 mEq/L (ref 3.5–5.1)

## 2011-10-26 LAB — LIPID PANEL
Cholesterol: 165 mg/dL (ref 0–200)
Triglycerides: 73 mg/dL (ref 0.0–149.0)
VLDL: 14.6 mg/dL (ref 0.0–40.0)

## 2011-10-29 LAB — HEMOGLOBIN A1C: Hgb A1c MFr Bld: 6.9 % — ABNORMAL HIGH (ref 4.6–6.5)

## 2011-11-02 ENCOUNTER — Ambulatory Visit (INDEPENDENT_AMBULATORY_CARE_PROVIDER_SITE_OTHER): Payer: Medicare Other | Admitting: Family Medicine

## 2011-11-02 ENCOUNTER — Encounter: Payer: Self-pay | Admitting: Family Medicine

## 2011-11-02 VITALS — BP 114/64 | HR 84 | Temp 97.9°F | Wt 234.0 lb

## 2011-11-02 DIAGNOSIS — N400 Enlarged prostate without lower urinary tract symptoms: Secondary | ICD-10-CM

## 2011-11-02 DIAGNOSIS — IMO0002 Reserved for concepts with insufficient information to code with codable children: Secondary | ICD-10-CM

## 2011-11-02 DIAGNOSIS — E785 Hyperlipidemia, unspecified: Secondary | ICD-10-CM

## 2011-11-02 DIAGNOSIS — I10 Essential (primary) hypertension: Secondary | ICD-10-CM

## 2011-11-02 DIAGNOSIS — R42 Dizziness and giddiness: Secondary | ICD-10-CM

## 2011-11-02 DIAGNOSIS — Z1211 Encounter for screening for malignant neoplasm of colon: Secondary | ICD-10-CM

## 2011-11-02 DIAGNOSIS — M1710 Unilateral primary osteoarthritis, unspecified knee: Secondary | ICD-10-CM

## 2011-11-02 DIAGNOSIS — Z0000 Encounter for general adult medical examination without abnormal findings: Secondary | ICD-10-CM

## 2011-11-02 DIAGNOSIS — E119 Type 2 diabetes mellitus without complications: Secondary | ICD-10-CM

## 2011-11-02 MED ORDER — HYDROCODONE-ACETAMINOPHEN 5-325 MG PO TABS: 1.0000 | ORAL_TABLET | Freq: Three times a day (TID) | ORAL | Status: DC | PRN

## 2011-11-02 MED ORDER — METHOCARBAMOL 500 MG PO TABS: 500.0000 mg | ORAL_TABLET | Freq: Four times a day (QID) | ORAL | Status: DC

## 2011-11-02 NOTE — Patient Instructions (Addendum)
Drop off a copy of your advance directive.   I would get a flu shot each fall.   Recheck A1c in 6 months before a visit with me.  If the vertigo happens again, I would take over the counter motion sickness pills as needed (meclizine/antivert).

## 2011-11-02 NOTE — Progress Notes (Signed)
I have personally reviewed the Medicare Annual Wellness questionnaire and have noted 1. The patient's medical and social history 2. Their use of alcohol, tobacco or illicit drugs 3. Their current medications and supplements 4. The patient's functional ability including ADL's, fall risks, home safety risks and hearing or visual             impairment. 5. Diet and physical activities 6. Evidence for depression or mood disorders  The patients weight, height, BMI have been recorded in the chart and and visual acuity is per eye clinic.  I have made referrals, counseling and provided education to the patient based review of the above and I have provided the pt with a written personalized care plan for preventive services.  Td 2014 PNA done Shingles done Flu done 2012 Colon cancer screening.  D/w patient [id number] for colon cancer screening, including IFOB vs. colonoscopy.  Risks and benefits of both were discussed and patient voiced understanding.  Pt elects for:  IFOB.   PSA prev wnl.  Advance directive d/w pt.  Had AD and wife is HCPOA per patient.    BPH.  Improved on flomax.  Stream is better.  No ADE.  No burning.  Nocturia x2, improved.   Knee pain, occ vicodin and robaxin use for pain and spasm.  Needs refills.  No ADE.  No constipation.    One episode of room spinning with head rotation.  He was exercising when it happened.  Had a HA intermittently.  Didn't have presyncope.  He had some muscle spasm in his chest wall later in the day, that may have been due to prex exercise, but didn't happen during exercise.  No other sx (vertigo or CP) since then, in spite of exercise.  No focal neuro changes.    Diabetes:  Using medications without difficulties:no meds Hypoglycemic episodes:not checked Hyperglycemic episodes:not checked Feet problems:no Blood Sugars averaging: not checked often eye exam within last year: f/u pending Wednesday.    Hypertension:    Using medication without  problems or lightheadedness: yes Chest pain with exertion:no Edema:no Short of breath: at baseline, only with exertion.  Still able to exercise.    Elevated Cholesterol: Using medications without problems:yes Muscle aches: no Diet compliance:yes Exercise: yes  PMH and SH reviewed.   Vital signs, Meds and allergies reviewed.  ROS: See HPI.  Otherwise nontributory.   GEN: nad, alert and oriented HEENT: mucous membranes moist NECK: supple w/o LA CV: rrr.  no murmur PULM: ctab, no inc wob ABD: soft, +bs EXT: no edema SKIN: no acute rash CN 2-12 wnl B, S/S/DTR wnl x4, DHP neg  Diabetic foot exam: Normal inspection No skin breakdown No calluses  Normal DP pulses Normal sensation to light tough and monofilament Nails normal

## 2011-11-03 DIAGNOSIS — Z0000 Encounter for general adult medical examination without abnormal findings: Secondary | ICD-10-CM | POA: Insufficient documentation

## 2011-11-03 NOTE — Assessment & Plan Note (Signed)
Controlled, continue diet and exercise.  Labs d/w pt.  

## 2011-11-03 NOTE — Assessment & Plan Note (Signed)
Continue current meds, labs d/w pt.  Continue diet and exercise.

## 2011-11-03 NOTE — Assessment & Plan Note (Signed)
Improved, continue current meds 

## 2011-11-03 NOTE — Assessment & Plan Note (Signed)
A1c not elevated, continue diet and exercise. Will recheck later in 2013.  He'll f/u at eye clinic.

## 2011-11-03 NOTE — Assessment & Plan Note (Signed)
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  Follow clinically.  Benign exam.  Anatomy and path/phys d/w pt.

## 2011-11-03 NOTE — Assessment & Plan Note (Signed)
S/p replacement, continue prn pain meds and continue exercise.

## 2011-11-17 ENCOUNTER — Other Ambulatory Visit: Payer: Medicare Other

## 2011-11-17 ENCOUNTER — Other Ambulatory Visit: Payer: Self-pay | Admitting: Family Medicine

## 2011-11-17 DIAGNOSIS — Z1211 Encounter for screening for malignant neoplasm of colon: Secondary | ICD-10-CM

## 2011-11-17 LAB — FECAL OCCULT BLOOD, IMMUNOCHEMICAL: Fecal Occult Bld: NEGATIVE

## 2011-11-19 ENCOUNTER — Ambulatory Visit: Payer: Medicare Other | Admitting: Oncology

## 2011-11-19 ENCOUNTER — Other Ambulatory Visit: Payer: Medicare Other

## 2011-11-25 ENCOUNTER — Encounter: Payer: Self-pay | Admitting: *Deleted

## 2011-11-27 ENCOUNTER — Telehealth: Payer: Self-pay | Admitting: Oncology

## 2011-11-27 ENCOUNTER — Telehealth: Payer: Self-pay | Admitting: *Deleted

## 2011-11-27 ENCOUNTER — Other Ambulatory Visit: Payer: Self-pay

## 2011-11-27 ENCOUNTER — Other Ambulatory Visit (HOSPITAL_BASED_OUTPATIENT_CLINIC_OR_DEPARTMENT_OTHER): Payer: Medicare Other | Admitting: Lab

## 2011-11-27 ENCOUNTER — Ambulatory Visit (HOSPITAL_COMMUNITY)
Admission: RE | Admit: 2011-11-27 | Discharge: 2011-11-27 | Disposition: A | Discharge status: Home / Self Care | Payer: Medicare Other | Source: Ambulatory Visit | Attending: Oncology | Admitting: Oncology

## 2011-11-27 ENCOUNTER — Other Ambulatory Visit: Payer: Self-pay | Admitting: Oncology

## 2011-11-27 ENCOUNTER — Encounter: Payer: Self-pay | Admitting: Oncology

## 2011-11-27 ENCOUNTER — Encounter: Payer: Self-pay | Admitting: Family Medicine

## 2011-11-27 ENCOUNTER — Ambulatory Visit (HOSPITAL_BASED_OUTPATIENT_CLINIC_OR_DEPARTMENT_OTHER): Payer: Medicare Other | Admitting: Oncology

## 2011-11-27 VITALS — BP 169/85 | HR 83 | Temp 97.4°F | Ht 74.25 in | Wt 231.4 lb

## 2011-11-27 DIAGNOSIS — Z85118 Personal history of other malignant neoplasm of bronchus and lung: Secondary | ICD-10-CM

## 2011-11-27 DIAGNOSIS — D485 Neoplasm of uncertain behavior of skin: Secondary | ICD-10-CM

## 2011-11-27 DIAGNOSIS — C3490 Malignant neoplasm of unspecified part of unspecified bronchus or lung: Secondary | ICD-10-CM

## 2011-11-27 DIAGNOSIS — R0602 Shortness of breath: Secondary | ICD-10-CM | POA: Insufficient documentation

## 2011-11-27 DIAGNOSIS — R0989 Other specified symptoms and signs involving the circulatory and respiratory systems: Secondary | ICD-10-CM

## 2011-11-27 LAB — COMPREHENSIVE METABOLIC PANEL
AST: 19 U/L (ref 0–37)
Albumin: 4.2 g/dL (ref 3.5–5.2)
Alkaline Phosphatase: 54 U/L (ref 39–117)
BUN: 20 mg/dL (ref 6–23)
Potassium: 4.3 mEq/L (ref 3.5–5.3)
Sodium: 141 mEq/L (ref 135–145)
Total Protein: 6.4 g/dL (ref 6.0–8.3)

## 2011-11-27 LAB — CBC WITH DIFFERENTIAL/PLATELET
BASO%: 0.7 % (ref 0.0–2.0)
EOS%: 1.2 % (ref 0.0–7.0)
MCH: 28.9 pg (ref 27.2–33.4)
MCHC: 32.9 g/dL (ref 32.0–36.0)
RBC: 5.14 10*6/uL (ref 4.20–5.82)
RDW: 14.2 % (ref 11.0–14.6)
lymph#: 1.5 10*3/uL (ref 0.9–3.3)
nRBC: 0 % (ref 0–0)

## 2011-11-27 NOTE — Telephone Encounter (Signed)
appts made and printed for pt aom °

## 2011-11-27 NOTE — Progress Notes (Signed)
CC:   Jonathan York. Dorris Fetch, M.D. Dwana Curd Para March, M.D.  PROBLEM LIST: 1. 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  Both of these were non     mucinous sclerosing subtypes.  The patient underwent left upper     lobectomy and lymph node sampling on 03/15/2001.  Tumor stage was     T4 N0 M0, i.e. stage IIIB.  All 14 lymph nodes were negative.  The     patient received 4 cycles of adjuvant chemotherapy with carboplatin     and Taxol from 05/04/2001 through 07/14/2001 and has remained     disease free since that time. 2. Hypertension. 3. Dyslipidemia. 4. Osteoarthritis involving the knees and spine. 5. Right adrenal nodule seen on CT scan in July 2002, felt to be an     adenoma. 6. Status post right L4-L5 laminectomy on 03/05/2006. 7. Status post left total knee arthroplasty on 03/02/2008. 8. Right total knee replacement in October 2011.  MEDICATIONS: 1. Norvasc 7.5 mg at bedtime. 2. Aspirin 325 mg 3 times a week. 3. Lipitor 80 mg at bedtime. 4. Zyrtec 10 mg as needed. 5. Cinnamon 500 mg daily. 6. Temovate 0.05% ointment, apply twice daily as needed. 7. Fish Oil omega-3 fatty acids 1000 mg 3 times daily. 8. Hydrochlorothiazide 12.5 mg daily. 9. Norco 5/325 mg as needed. 10.Robaxin 500 mg 4 times daily. 11.Multivitamins 1 daily. 12.Aleve 220 mg as needed. 13.Reguloid 0.52 gram capsule daily. 14.Altace 10 mg daily. 15.Flomax 0.4 mg daily.  HISTORY:  I saw Quintyn Dombek today for followup of his bronchoalveolar carcinoma stage IIIB dating back to August 2002.  Michelle Piper was last seen by [place] on 11/20/2010.  He continues to do well.  There have been no health issues over the past year.  His only problem seems to be increasing dyspnea on exertion especially with climbing stairs and bending over. Aside from this, he has no new symptomatology or any symptoms to  suggest recurrent lung cancer.  He works out on a regular basis.  PHYSICAL EXAMINATION:  He looks well.  He is now 78 years old.  He tells me that he and his wife, Darel Hong, just celebrated their 49th anniversary. Weight is fairly stable 231.4 pounds, height 6 feet 2-1/4 inches, body surface area 2.34 meters squared.  Blood pressure 169/85.  The patient was made aware of his elevated blood pressure.  There is no scleral icterus.  Mouth and pharynx are benign.  No peripheral adenopathy palpable.  Heart and lungs are normal.  No evidence for recurrent tumor in the left chest.  Abdomen is benign.  Extremities, no peripheral edema or clubbing.  Neurologic exam is normal.  The patient has had bilateral knee replacements.  LABORATORY DATA:  Today, white count 8.1, ANC 5.8, hemoglobin 14.8, hematocrit 45.2, platelets 192,000.  Chemistries today are pending. Chemistries from his last visit here on 11/20/2010 were normal except for a glucose of 113.  He also had chemistries on 10/26/2011 notable for a glucose of 123.  GFR was estimated to be 69.1.  We have a vitamin B12 level going back to 10/27/2010 of 377.  IMAGING STUDIES: 1. Chest x-ray, 2 view, from 11/12/2009 showed no acute disease. 2. Chest x-ray, 2 view, from 11/20/2010 showed stable cardiopulmonary     appearance with postoperative change in the left hemithorax.  There  was no evidence for recurrence or other abnormality.  IMPRESSION AND PLAN:  Michelle Piper continues to do well now approaching 11 years from the time of diagnosis without evidence of recurrence.  We will continue with yearly chest x-rays especially since the patient does note some increasing dyspnea on exertion.  He no longer smokes.  I believe he gave up smoking in the mid 1980s.  We will plan to see Michelle Piper again in 1 year, at which time we will check CBC, chemistries and LDH.  We will decide about a chest x-ray at that time.    ______________________________ Samul Dada, M.D. DSM/MEDQ  D:  11/27/2011  T:  11/27/2011  Job:  [id number]

## 2011-11-27 NOTE — Telephone Encounter (Signed)
Made patient aware his CXR was stable. He also reports he got results of his stool cards today and they were negative.

## 2011-11-27 NOTE — Progress Notes (Signed)
This office note has been dictated.  #[id number]

## 2011-11-27 NOTE — Progress Notes (Signed)
Quick Note:  Please notify patient and call/fax these results to patient's doctors. ______ 

## 2011-11-27 NOTE — Telephone Encounter (Signed)
Message copied by Wandalee Ferdinand on Fri Nov 27, 2011  2:51 PM ------      Message from: Samul Dada      Created: Fri Nov 27, 2011  2:40 PM       Please notify patient and call/fax these results to patient's doctors.

## 2011-11-30 ENCOUNTER — Other Ambulatory Visit: Payer: Self-pay | Admitting: Family Medicine

## 2011-11-30 ENCOUNTER — Other Ambulatory Visit: Payer: Self-pay | Admitting: *Deleted

## 2011-11-30 MED ORDER — AMLODIPINE BESYLATE 5 MG PO TABS: ORAL_TABLET | ORAL | Status: DC

## 2011-12-18 ENCOUNTER — Ambulatory Visit (INDEPENDENT_AMBULATORY_CARE_PROVIDER_SITE_OTHER): Payer: Medicare Other | Admitting: Family Medicine

## 2011-12-18 ENCOUNTER — Encounter: Payer: Self-pay | Admitting: Family Medicine

## 2011-12-18 VITALS — BP 136/60 | HR 92 | Temp 98.4°F | Wt 234.0 lb

## 2011-12-18 DIAGNOSIS — R05 Cough: Secondary | ICD-10-CM | POA: Insufficient documentation

## 2011-12-18 DIAGNOSIS — R059 Cough, unspecified: Secondary | ICD-10-CM | POA: Insufficient documentation

## 2011-12-18 MED ORDER — ALBUTEROL SULFATE HFA 108 (90 BASE) MCG/ACT IN AERS: 2.0000 | INHALATION_SPRAY | Freq: Four times a day (QID) | RESPIRATORY_TRACT | Status: DC | PRN

## 2011-12-18 NOTE — Assessment & Plan Note (Signed)
Likely viral, nontoxic and feeling well except for cough.  Used SABA her after instruction and felt better with dec in wheeze and cough.  DDx d/w pt, would not use abx at this point.  Continue supportive care and call back as needed.  He agrees.

## 2011-12-18 NOTE — Progress Notes (Signed)
Over last few days he has extra noise in his chest, dry cough.  No fever but felt warm.  Cough comes in fits.  No rash, vomiting.  He feels well except for the cough.  Using halls and robitussin to help with cough.  No ear pain.  Minimal ST initially, better with salt water.  Occ wheeze.  Meds, vitals, and allergies reviewed.   ROS: See HPI.  Otherwise, noncontributory.  nad ncat  Tm w/o erythema Nasal and OP exam wnl Neck supple, no LA rrr No inc in wob but diffuse yet scant wheeze with some coarse BS B

## 2011-12-18 NOTE — Patient Instructions (Signed)
Use the inhaler every 6 hours and try to get some rest.  If you aren't improving, let me know.  Take care.

## 2011-12-30 ENCOUNTER — Other Ambulatory Visit: Payer: Self-pay | Admitting: *Deleted

## 2011-12-30 MED ORDER — TAMSULOSIN HCL 0.4 MG PO CAPS: 0.4000 mg | ORAL_CAPSULE | Freq: Every day | ORAL | Status: DC

## 2012-01-26 ENCOUNTER — Ambulatory Visit (INDEPENDENT_AMBULATORY_CARE_PROVIDER_SITE_OTHER): Payer: Medicare Other | Admitting: Family Medicine

## 2012-01-26 ENCOUNTER — Encounter: Payer: Self-pay | Admitting: Family Medicine

## 2012-01-26 VITALS — BP 128/70 | HR 64 | Temp 98.2°F | Wt 236.8 lb

## 2012-01-26 DIAGNOSIS — L820 Inflamed seborrheic keratosis: Secondary | ICD-10-CM

## 2012-01-26 DIAGNOSIS — L989 Disorder of the skin and subcutaneous tissue, unspecified: Secondary | ICD-10-CM

## 2012-01-26 NOTE — Patient Instructions (Addendum)
Keep a bandaid on the area and it should heal.  We'll send the skin for pathology.  Take care.

## 2012-01-26 NOTE — Progress Notes (Signed)
Skin tag excision  Meds, vitals, and allergies reviewed.   Indication: irritated skin tag  Location: L side of nose  Size: <1cm  Informed consent obtained.  Pt aware of risks not limited to but including infection, bleeding, damage to near by organs.  Prep: etoh/betadine  Anesthesia: 1%lidocaine without epi, good effect  Excision with scissors  Minimal oozing, controlled with silver nitrate and pressure  Tolerated well  Routine postprocedure instructions d/w pt- keep area clean and bandaged, follow up if concerns/spreading erythema/pain.

## 2012-01-26 NOTE — Assessment & Plan Note (Signed)
Likely irritated skin tag.  Tolerated removal well.  Routine instructions given.  F/u prn.  Await path.

## 2012-01-28 ENCOUNTER — Telehealth: Payer: Self-pay

## 2012-01-28 NOTE — Telephone Encounter (Signed)
Told patient path results showed condition was not cancerous or precancerous and that it should heal well; patient acknowledged information

## 2012-03-31 ENCOUNTER — Other Ambulatory Visit: Payer: Self-pay | Admitting: Family Medicine

## 2012-04-27 ENCOUNTER — Ambulatory Visit (INDEPENDENT_AMBULATORY_CARE_PROVIDER_SITE_OTHER): Payer: Medicare Other

## 2012-04-27 DIAGNOSIS — Z23 Encounter for immunization: Secondary | ICD-10-CM

## 2012-05-02 ENCOUNTER — Other Ambulatory Visit: Payer: Self-pay | Admitting: *Deleted

## 2012-05-02 ENCOUNTER — Other Ambulatory Visit (INDEPENDENT_AMBULATORY_CARE_PROVIDER_SITE_OTHER): Payer: Medicare Other

## 2012-05-02 DIAGNOSIS — E119 Type 2 diabetes mellitus without complications: Secondary | ICD-10-CM

## 2012-05-02 LAB — HEMOGLOBIN A1C: Hgb A1c MFr Bld: 7.1 % — ABNORMAL HIGH (ref 4.6–6.5)

## 2012-05-02 MED ORDER — TAMSULOSIN HCL 0.4 MG PO CAPS: 0.4000 mg | ORAL_CAPSULE | Freq: Every day | ORAL | Status: DC

## 2012-05-05 ENCOUNTER — Encounter: Payer: Self-pay | Admitting: *Deleted

## 2012-05-05 ENCOUNTER — Encounter: Payer: Self-pay | Admitting: Family Medicine

## 2012-05-05 ENCOUNTER — Ambulatory Visit (INDEPENDENT_AMBULATORY_CARE_PROVIDER_SITE_OTHER): Payer: Medicare Other | Admitting: Cardiology

## 2012-05-05 ENCOUNTER — Ambulatory Visit (INDEPENDENT_AMBULATORY_CARE_PROVIDER_SITE_OTHER): Payer: Medicare Other | Admitting: Family Medicine

## 2012-05-05 ENCOUNTER — Encounter: Payer: Self-pay | Admitting: Cardiology

## 2012-05-05 VITALS — BP 132/66 | HR 74 | Temp 97.8°F | Wt 240.0 lb

## 2012-05-05 VITALS — BP 176/90 | HR 76 | Ht 75.4 in | Wt 240.8 lb

## 2012-05-05 DIAGNOSIS — E119 Type 2 diabetes mellitus without complications: Secondary | ICD-10-CM

## 2012-05-05 DIAGNOSIS — I4891 Unspecified atrial fibrillation: Secondary | ICD-10-CM

## 2012-05-05 DIAGNOSIS — I10 Essential (primary) hypertension: Secondary | ICD-10-CM

## 2012-05-05 DIAGNOSIS — L570 Actinic keratosis: Secondary | ICD-10-CM

## 2012-05-05 DIAGNOSIS — R0989 Other specified symptoms and signs involving the circulatory and respiratory systems: Secondary | ICD-10-CM | POA: Insufficient documentation

## 2012-05-05 DIAGNOSIS — E785 Hyperlipidemia, unspecified: Secondary | ICD-10-CM

## 2012-05-05 HISTORY — DX: Other specified symptoms and signs involving the circulatory and respiratory systems: R09.89

## 2012-05-05 LAB — CBC WITH DIFFERENTIAL/PLATELET
Basophils Absolute: 0 10*3/uL (ref 0.0–0.1)
Lymphocytes Relative: 27.7 % (ref 12.0–46.0)
Lymphs Abs: 2.1 10*3/uL (ref 0.7–4.0)
Monocytes Relative: 10 % (ref 3.0–12.0)
Platelets: 200 10*3/uL (ref 150.0–400.0)
RDW: 14.8 % — ABNORMAL HIGH (ref 11.5–14.6)

## 2012-05-05 LAB — COMPREHENSIVE METABOLIC PANEL
ALT: 28 U/L (ref 0–53)
CO2: 29 mEq/L (ref 19–32)
Calcium: 9.1 mg/dL (ref 8.4–10.5)
Chloride: 106 mEq/L (ref 96–112)
GFR: 78.84 mL/min (ref >60.00)
Sodium: 140 mEq/L (ref 135–145)
Total Protein: 6.5 g/dL (ref 6.0–8.3)

## 2012-05-05 LAB — TSH: TSH: 1.63 u[IU]/mL (ref 0.35–5.50)

## 2012-05-05 MED ORDER — RIVAROXABAN 20 MG PO TABS: 20.0000 mg | ORAL_TABLET | Freq: Every day | ORAL | Status: DC

## 2012-05-05 MED ORDER — METOPROLOL TARTRATE 25 MG PO TABS: 25.0000 mg | ORAL_TABLET | Freq: Two times a day (BID) | ORAL | Status: DC | PRN

## 2012-05-05 NOTE — Assessment & Plan Note (Signed)
Treated x3 with liq N2 w/o complication.  If persists, we can retreat.  He understood.

## 2012-05-05 NOTE — Assessment & Plan Note (Signed)
New dx.  rx sent for BB if he has palpitations.  Will get cards input, continue ASA for now and check basic labs.  If CP or more SOB then to ER.  Not sob now, nontoxic.  Basics of AF d/w pt.

## 2012-05-05 NOTE — Progress Notes (Signed)
Jonathan York Date of Birth: 10-31-1932 Medical Record #[id number]  History of Present Illness: Jonathan York is seen at the request of Jonathan York today for evaluation of atrial fibrillation. He is a 78 year old white male with history of hypertension, diabetes mellitus, and hyperlipidemia. He has a prior history of lung cancer treated with resection and chemotherapy in 2002. When seen for routine checkup today he is pulse was noted to be irregular. ECG confirmed that he was in atrial fibrillation with controlled ventricular response of 76 beats per minute. He does report that since the summer he has had increased dyspnea on exertion particularly when working in his garden or bending over. He does go to the gym and uses an exercise bike and treadmill 5 days a week and reports that his breathing has been more heavy. He has had only one episode of lightheadedness while walking on his treadmill. He denies any chest pain. He denies any palpitations. His pulse rate in April was reported as normal.  Current Outpatient Prescriptions on File Prior to Visit  Medication Sig Dispense Refill  . amLODipine (NORVASC) 5 MG tablet Take one and one half tablets (=7.5 mg) by mouth at bedtime.  45 tablet  11  . aspirin 325 MG tablet Take 325 mg by mouth 3 (three) times a week.       Marland Kitchen atorvastatin (LIPITOR) 80 MG tablet Take 80 mg by mouth at bedtime.      . cetirizine (ZYRTEC) 10 MG tablet Take 10 mg by mouth daily as needed.       . Cinnamon 500 MG capsule Take 500 mg by mouth daily.      . clobetasol (TEMOVATE) 0.05 % ointment Apply 1 application topically 2 (two) times daily as needed.       . fish oil-omega-3 fatty acids 1000 MG capsule Take 1 g by mouth 3 (three) times daily.        . hydrochlorothiazide (MICROZIDE) 12.5 MG capsule TAKE ONE CAPSULE BY MOUTH IN THE MORNING  90 capsule  2  . HYDROcodone-acetaminophen (NORCO) 5-325 MG per tablet Take 1 tablet by mouth every 8 (eight) hours as needed.  50 tablet  1    . methocarbamol (ROBAXIN) 500 MG tablet Take 500 mg by mouth 4 (four) times daily as needed.      . Multiple Vitamins-Minerals (CENTRUM) tablet Take 1 tablet by mouth daily.        . Naproxen Sodium (ALEVE) 220 MG CAPS Take 1 capsule by mouth daily as needed.      . psyllium (REGULOID) 0.52 G capsule Take 0.52 g by mouth daily.      . ramipril (ALTACE) 10 MG capsule TAKE ONE CAPSULE BY MOUTH EVERY DAY  30 capsule  6  . Tamsulosin HCl (FLOMAX) 0.4 MG CAPS Take 1 capsule (0.4 mg total) by mouth daily.  30 capsule  6  . Rivaroxaban (XARELTO) 20 MG TABS Take 1 tablet (20 mg total) by mouth daily.  30 tablet  6    No Known Allergies  Past Medical History  Diagnosis Date  . HLD (hyperlipidemia)   . HTN (hypertension)   . Diabetes mellitus type II   . BPH (benign prostatic hypertrophy)   . Cancer     lung cancer dx'd 2002  . Arthritis     Past Surgical History  Procedure Date  . Hemorrhoid surgery 1970  . Cataract extraction     right  . Retinal detachment surgery  right  . Wedge resection 03/15/01    [facility] Dorris Fetch)  . Lobectomy 03/15/01    [facility] Dorris Fetch)  . Spine surgery approx 2006    Ruptered Disc  . Total knee arthroplasty     x2 per Dr. Rayburn Ma    History  Smoking status  . Former Smoker -- 1.0 packs/day for 32 years  . Types: Cigarettes  . Quit date: 09/03/1982  Smokeless tobacco  . Never Used    History  Alcohol Use No    Family History  Problem Relation Age of Onset  . Emphysema Father   . Dementia Mother   . Diabetes Mother   . Hypertension Mother   . Hypothyroidism Sister   . Prostate cancer Neg Hx   . Colon cancer Neg Hx   . Dementia Brother     Review of Systems: The review of systems is positive for one episode of tachycardia 2 years ago that resolved spontaneously. He denies any history of stroke or TIA. He's had no bleeding problems. He reports a history of carotid bruit but has never had evaluation with Dopplers. He states that prior  to knee surgery in 2010 his ECG was abnormal and suggestive of an old MI. Echocardiogram showed normal LV function. All other systems were reviewed and are negative.  Physical Exam: BP 176/90  Pulse 76  Ht 6' 3.4" (1.915 m)  Wt 240 lb 12.8 oz (109.226 kg)  BMI 29.78 kg/m2  SpO2 97% He is a pleasant white male in no acute distress.The patient is alert and oriented x 3.  The mood and affect are normal.  The skin is warm and dry.  Color is normal.  The HEENT exam reveals that the sclera are nonicteric.  The mucous membranes are moist.  The carotids are 2+ with a left carotid bruit.  There is no thyromegaly.  There is no JVD.  The lungs are clear.  The chest wall is non tender.  The heart exam reveals an irregular rate with a normal S1 and S2.  There are no murmurs, gallops, or rubs.  The PMI is not displaced.   Abdominal exam reveals good bowel sounds.    There is no hepatosplenomegaly or tenderness.  There is a loud abdominal bruit. There are no masses.  Exam of the legs reveal no clubbing, cyanosis, or edema.  The legs are without rashes.  The distal pulses are intact.  Cranial nerves II - XII are intact.  Motor and sensory functions are intact.  The gait is normal.  LABORATORY DATA: ECG today demonstrates atrial fibrillation with a rate of 63 beats per minute. It suggests an anterior septal infarct age undetermined.  Assessment / Plan: 1. New-onset of atrial fibrillation. Rate is well controlled on no rate slowing medication. Patient is mildly symptomatic with dyspnea. The patient was given a prescription for metoprolol but I recommended that he not take it at this time since it appears his heart rate is well controlled. We will start him on anticoagulant therapy. He has a [place] vascular score of 4. We discussed the various options and decided to use Xarelto 20 mg daily. He had chemistries, CBC, and TSH drawn today. We will schedule him for an echocardiogram. I will see him back in 4 weeks and we will  consider elective DC cardioversion at that time. He will be enrolled in our [facility] registry. I have recommended that he stop taking aspirin.  2. Left carotid bruit. We will schedule for carotid Doppler  studies.  3. Abdominal bruit. Patient has a history of tobacco abuse and is over age 81. His son has a history of abdominal aneurysm. We'll schedule him for abdominal ultrasound.  4. Hypertension.  5. Hyperlipidemia.  6. Diabetes mellitus type 2.  7. History of lung CA status post resection and chemotherapy in 2002.

## 2012-05-05 NOTE — Progress Notes (Signed)
Diabetes:  No meds.  Hypoglycemic episodes: no sx Hyperglycemic episodes: no sx Feet problems:no Blood Sugars averaging: not checked.  eye exam within last year: yes, ~2 months ago A1c up to 7.1.  He admits to dietary discretion with ice cream  AK on R ear.    SOB with exertion.  H/o lung resection.  He notes it over the last year, not just the last few days.  No CP.  No h/o MI, CABG, stent, etc. Some wheeze, better with a cough.  No sputum. Prev smoked for ~30 years, quit at ~age 40.  Nonsmoker since 1984.  PMH and SH reviewed  Meds, vitals, and allergies reviewed.   ROS: See HPI.  Otherwise negative.    GEN: nad, alert and oriented HEENT: mucous membranes moist NECK: supple w/o LA CV: frequent ectopy noted PULM: ctab, no inc wob ABD: soft, +bs EXT: no edema SKIN: no acute rash but AK noted on R pinna, treated x3 with liq N2 w/o complication, tolerated well.   EKG with AF.

## 2012-05-05 NOTE — Patient Instructions (Addendum)
Go to the lab on the way out.  We'll contact you with your lab report. See Shirlee Limerick about your referral before you leave today. If you have chest pain or more shortness of breath, then go to the ER.  If you have palpitations, then take the metoprolol.  If it persists, then go to ER.  Don't exert yourself until you see cardiology. Keep taking aspirin daily.   The spot on your ear should heal and feel smooth.  If it stays rough, we can treat it again.  Plan for a physical in 6 months. Cut back on sweets and work on your weight.

## 2012-05-05 NOTE — Assessment & Plan Note (Signed)
No meds, he'll work on diet and weight.  Recheck at CPE in 2014. Labs dw pt. He understood.

## 2012-05-05 NOTE — Patient Instructions (Signed)
Stop taking ASA.  Do not start metoprolol.  We will start you on Xarelto 20 mg daily as a blood thinner  We will schedule your for an Echocardiogram.  I will see you back in 4 weeks.

## 2012-05-09 ENCOUNTER — Telehealth: Payer: Self-pay

## 2012-05-09 ENCOUNTER — Encounter (INDEPENDENT_AMBULATORY_CARE_PROVIDER_SITE_OTHER): Payer: Medicare Other

## 2012-05-09 DIAGNOSIS — E785 Hyperlipidemia, unspecified: Secondary | ICD-10-CM

## 2012-05-09 DIAGNOSIS — I10 Essential (primary) hypertension: Secondary | ICD-10-CM

## 2012-05-09 DIAGNOSIS — I6529 Occlusion and stenosis of unspecified carotid artery: Secondary | ICD-10-CM

## 2012-05-09 DIAGNOSIS — E119 Type 2 diabetes mellitus without complications: Secondary | ICD-10-CM

## 2012-05-09 DIAGNOSIS — R0989 Other specified symptoms and signs involving the circulatory and respiratory systems: Secondary | ICD-10-CM

## 2012-05-09 DIAGNOSIS — I4891 Unspecified atrial fibrillation: Secondary | ICD-10-CM

## 2012-05-09 MED ORDER — RIVAROXABAN 20 MG PO TABS: 20.0000 mg | ORAL_TABLET | Freq: Every day | ORAL | Status: DC

## 2012-05-09 NOTE — Telephone Encounter (Signed)
Patient called spoke to wife was told xarelto 20mg  approved with [facility] until 05/09/13.

## 2012-05-10 ENCOUNTER — Telehealth: Payer: Self-pay

## 2012-05-10 DIAGNOSIS — I6522 Occlusion and stenosis of left carotid artery: Secondary | ICD-10-CM

## 2012-05-10 DIAGNOSIS — I4891 Unspecified atrial fibrillation: Secondary | ICD-10-CM

## 2012-05-10 NOTE — Telephone Encounter (Signed)
Patient called was told received a message from Dr.Jordan,he advised severe left carotid stenosis.Appointment to be scheduled with [facility] and needs lexiscan myoview.Schedulers will be calling to schedule.

## 2012-05-11 ENCOUNTER — Ambulatory Visit (HOSPITAL_COMMUNITY): Payer: Medicare Other | Attending: Cardiovascular Disease | Admitting: Radiology

## 2012-05-11 ENCOUNTER — Encounter: Payer: Self-pay | Admitting: Vascular Surgery

## 2012-05-11 VITALS — BP 148/87 | HR 61 | Ht 75.0 in | Wt 238.0 lb

## 2012-05-11 DIAGNOSIS — Z01810 Encounter for preprocedural cardiovascular examination: Secondary | ICD-10-CM | POA: Insufficient documentation

## 2012-05-11 DIAGNOSIS — I6522 Occlusion and stenosis of left carotid artery: Secondary | ICD-10-CM

## 2012-05-11 DIAGNOSIS — R0602 Shortness of breath: Secondary | ICD-10-CM

## 2012-05-11 DIAGNOSIS — I779 Disorder of arteries and arterioles, unspecified: Secondary | ICD-10-CM | POA: Insufficient documentation

## 2012-05-11 DIAGNOSIS — E119 Type 2 diabetes mellitus without complications: Secondary | ICD-10-CM | POA: Insufficient documentation

## 2012-05-11 DIAGNOSIS — I10 Essential (primary) hypertension: Secondary | ICD-10-CM | POA: Insufficient documentation

## 2012-05-11 DIAGNOSIS — I6529 Occlusion and stenosis of unspecified carotid artery: Secondary | ICD-10-CM

## 2012-05-11 DIAGNOSIS — R0609 Other forms of dyspnea: Secondary | ICD-10-CM | POA: Insufficient documentation

## 2012-05-11 DIAGNOSIS — R0989 Other specified symptoms and signs involving the circulatory and respiratory systems: Secondary | ICD-10-CM | POA: Insufficient documentation

## 2012-05-11 DIAGNOSIS — I4891 Unspecified atrial fibrillation: Secondary | ICD-10-CM | POA: Insufficient documentation

## 2012-05-11 DIAGNOSIS — E785 Hyperlipidemia, unspecified: Secondary | ICD-10-CM | POA: Insufficient documentation

## 2012-05-11 DIAGNOSIS — Z87891 Personal history of nicotine dependence: Secondary | ICD-10-CM | POA: Insufficient documentation

## 2012-05-11 DIAGNOSIS — R42 Dizziness and giddiness: Secondary | ICD-10-CM | POA: Insufficient documentation

## 2012-05-11 MED ORDER — TECHNETIUM TC 99M SESTAMIBI GENERIC - CARDIOLITE
30.0000 | Freq: Once | INTRAVENOUS | Status: AC | PRN
  Administered 2012-05-11: 30 via INTRAVENOUS

## 2012-05-11 MED ORDER — REGADENOSON 0.4 MG/5ML IV SOLN
0.4000 mg | Freq: Once | INTRAVENOUS | Status: AC
  Administered 2012-05-11: 0.4 mg via INTRAVENOUS

## 2012-05-11 MED ORDER — TECHNETIUM TC 99M SESTAMIBI GENERIC - CARDIOLITE
10.0000 | Freq: Once | INTRAVENOUS | Status: AC | PRN
  Administered 2012-05-11: 10 via INTRAVENOUS

## 2012-05-11 NOTE — Progress Notes (Signed)
[facility] SITE 3 NUCLEAR MED [id number] [place] [address] [phone]  Cardiology Nuclear Med Study  Jonathan York is a 78 y.o. male     MRN : [id number]     DOB: 1932/12/27  Procedure Date: 05/11/2012  Nuclear Med Background Indication for Stress Test:  Evaluation for Ischemia and Pending Surgical Clearance for Possible (L) CEA History:  New onset atrial fibrillation; '02 Chemo; '09 Echo:EF=60% Cardiac Risk Factors: Carotid Disease, History of Smoking, Hypertension, Lipids and NIDDM  Symptoms:  Dizziness While on Treadmill and DOE   Nuclear Pre-Procedure Caffeine/Decaff Intake:  None NPO After: 8:00am   Lungs:  Clear. O2 Sat: 97% on room air. IV 0.9% NS with Angio Cath:  20g  IV Site: Jonathan Antecubital  IV Started by:  Stanton Kidney, EMT-P  Chest Size (in):  44 Cup Size: n/a  Height: 6\' 3"  (1.905 m)  Weight:  238 lb (107.956 kg)  BMI:  Body mass index is 29.75 kg/(m^2). Tech Comments:  NA    Nuclear Med Study 1 or 2 day study: 1 day  Stress Test Type:  Lexiscan  Reading MD: Charlton Haws, MD  Order Authorizing Provider:  Aveah Castell [place], MD  Resting Radionuclide: Technetium 38m Sestamibi  Resting Radionuclide Dose: 10.9 mCi   Stress Radionuclide:  Technetium 44m Sestamibi  Stress Radionuclide Dose: 32.9 mCi           Stress Protocol Rest HR: 61 Stress HR: 102  Rest BP: 148/87 Stress BP: 156/83  Exercise Time (min): n/a METS: n/a   Predicted Max HR: 144 bpm % Max HR: 70.83 bpm Rate Pressure Product: 62952   Dose of Adenosine (mg):  n/a Dose of Lexiscan: 0.4 mg  Dose of Atropine (mg): n/a Dose of Dobutamine: n/a mcg/kg/min (at max HR)  Stress Test Technologist: Smiley Houseman, CMA-N  Nuclear Technologist:  Domenic Polite, CNMT     Rest Procedure:  Myocardial perfusion imaging was performed at rest 45 minutes following the intravenous administration of Technetium 5m Sestamibi.  Rest ECG: Atrial fibrillation with possible prior  ASWMI.  Stress Procedure:  The patient received IV Lexiscan 0.4 mg over 15-seconds.  Technetium 95m Sestamibi was injected at 30-seconds.  There were no significant changes with Lexiscan, rare PVC was noted.  He did c/o chest tightness, 5/10, with Lexiscan.  Quantitative spect images were obtained after a 45 minute delay.  Stress ECG: No significant change from baseline ECG  QPS Raw Data Images:  Normal; no motion artifact; normal heart/lung ratio. Stress Images:  Normal homogeneous uptake in all areas of the myocardium. Rest Images:  Normal homogeneous uptake in all areas of the myocardium. Subtraction (SDS):  Normal Transient Ischemic Dilatation (Normal <1.22):  1.06 Lung/Heart Ratio (Normal <0.45):  0.31  Quantitative Gated Spect Images QGS EDV:  95 ml QGS ESV:  36 ml  Impression Exercise Capacity:  Lexiscan with no exercise. BP Response:  Normal blood pressure response. Clinical Symptoms:  There is dyspnea. ECG Impression:  No significant ST segment change suggestive of ischemia. Comparison with Prior Nuclear Study: No previous nuclear study performed  Overall Impression:  Normal stress nuclear study.  LV Ejection Fraction: 62%.  LV Wall Motion:  NL LV Function; NL Wall Motion   Charlton Haws

## 2012-05-12 ENCOUNTER — Ambulatory Visit (INDEPENDENT_AMBULATORY_CARE_PROVIDER_SITE_OTHER): Payer: Medicare Other | Admitting: Vascular Surgery

## 2012-05-12 ENCOUNTER — Encounter: Payer: Self-pay | Admitting: Vascular Surgery

## 2012-05-12 VITALS — BP 179/73 | HR 87 | Ht 75.0 in | Wt 240.0 lb

## 2012-05-12 DIAGNOSIS — I6529 Occlusion and stenosis of unspecified carotid artery: Secondary | ICD-10-CM | POA: Insufficient documentation

## 2012-05-12 NOTE — Progress Notes (Signed)
History of Present Illness:  Patient is a 78 y.o. year old male who presents for evaluation of carotid stenosis.  The patient denies symptoms of TIA, amaurosis, or stroke.  The patient is currently on Xarelto for atrial fibrillation.  His aspirin was stopped when he was placed on Xarelto. The carotid stenosis was found on screening duplex for a bruit.  Other medical problems include hyperlipidemia, hypertension, diabetes, arthritis.  These are currently stable and followed by his primary care physician.  Past Medical History  Diagnosis Date  . HLD (hyperlipidemia)   . HTN (hypertension)   . Diabetes mellitus type II   . BPH (benign prostatic hypertrophy)   . Cancer     lung cancer dx'd 2002  . Arthritis   . Abdominal bruit 05/05/2012    Past Surgical History  Procedure Date  . Hemorrhoid surgery 1970  . Cataract extraction     right  . Retinal detachment surgery     right  . Wedge resection 03/15/01    LUL ([facility])  . Lobectomy 03/15/01    LUL ([facility])  . Spine surgery approx 2006    Ruptered Disc  . Total knee arthroplasty     x2 per Dr. Blackmon     Social History History  Substance Use Topics  . Smoking status: Former Smoker -- 1.0 packs/day for 32 years    Types: Cigarettes    Quit date: 09/03/1982  . Smokeless tobacco: Never Used  . Alcohol Use: No    Family History Family History  Problem Relation Age of Onset  . Emphysema Father   . Dementia Mother   . Diabetes Mother   . Hypertension Mother   . Hypothyroidism Sister   . Prostate cancer Neg Hx   . Colon cancer Neg Hx   . Dementia Brother    Abdominal aortic aneurysm son  Allergies  No Known Allergies   Current Outpatient Prescriptions  Medication Sig Dispense Refill  . amLODipine (NORVASC) 5 MG tablet Take one and one half tablets (=7.5 mg) by mouth at bedtime.  45 tablet  11  . aspirin 325 MG tablet Take 325 mg by mouth 3 (three) times a week.       . atorvastatin (LIPITOR) 80 MG tablet  Take 80 mg by mouth at bedtime.      . cetirizine (ZYRTEC) 10 MG tablet Take 10 mg by mouth daily as needed.       . Cinnamon 500 MG capsule Take 500 mg by mouth daily.      . clobetasol (TEMOVATE) 0.05 % ointment Apply 1 application topically 2 (two) times daily as needed.       . fish oil-omega-3 fatty acids 1000 MG capsule Take 1 g by mouth 3 (three) times daily.        . hydrochlorothiazide (MICROZIDE) 12.5 MG capsule TAKE ONE CAPSULE BY MOUTH IN THE MORNING  90 capsule  2  . HYDROcodone-acetaminophen (NORCO) 5-325 MG per tablet Take 1 tablet by mouth every 8 (eight) hours as needed.  50 tablet  1  . methocarbamol (ROBAXIN) 500 MG tablet Take 500 mg by mouth 4 (four) times daily as needed.      . Multiple Vitamins-Minerals (CENTRUM) tablet Take 1 tablet by mouth daily.        . Naproxen Sodium (ALEVE) 220 MG CAPS Take 1 capsule by mouth daily as needed.      . psyllium (REGULOID) 0.52 G capsule Take 0.52 g by mouth daily.      .   ramipril (ALTACE) 10 MG capsule TAKE ONE CAPSULE BY MOUTH EVERY DAY  30 capsule  6  . Rivaroxaban (XARELTO) 20 MG TABS Take 1 tablet (20 mg total) by mouth daily.  30 tablet  6  . Tamsulosin HCl (FLOMAX) 0.4 MG CAPS Take 1 capsule (0.4 mg total) by mouth daily.  30 capsule  6   No current facility-administered medications for this visit.   Facility-Administered Medications Ordered in Other Visits  Medication Dose Route Frequency Provider Last Rate Last Dose  . regadenoson (LEXISCAN) injection SOLN 0.4 mg  0.4 mg Intravenous Once Peter C Nishan, MD   0.4 mg at 05/11/12 1349  . technetium sestamibi generic (CARDIOLITE) injection 10 milli Curie  10 milli Curie Intravenous Once PRN Peter C Nishan, MD   10 milli Curie at 05/11/12 1230  . technetium sestamibi generic (CARDIOLITE) injection 30 milli Curie  30 milli Curie Intravenous Once PRN Peter C Nishan, MD   30 milli Curie at 05/11/12 1350    ROS:   General:  No weight loss, Fever, chills  HEENT: No recent  headaches, no nasal bleeding, no visual changes, no sore throat  Neurologic: No dizziness, blackouts, seizures. No recent symptoms of stroke or mini- stroke. No recent episodes of slurred speech, or temporary blindness.  Cardiac: No recent episodes of chest pain/pressure, no shortness of breath at rest.  + shortness of breath with exertion.  + history of atrial fibrillation or irregular heartbeat  Vascular: No history of rest pain in feet.  No history of claudication.  No history of non-healing ulcer, No history of DVT   Pulmonary: No home oxygen, no productive cough, no hemoptysis,  No asthma or wheezing  Musculoskeletal:  [ ] Arthritis, [x ] Low back pain,  [ ] Joint pain  Hematologic:No history of hypercoagulable state.  No history of easy bleeding.  No history of anemia  Gastrointestinal: No hematochezia or melena,  No gastroesophageal reflux, no trouble swallowing  Urinary: [ ] chronic Kidney disease, [ ] on HD - [ ] MWF or [ ] TTHS, [ ] Burning with urination, [ ] Frequent urination, [ ] Difficulty urinating;   Skin: No rashes  Psychological: No history of anxiety,  No history of depression   Physical Examination  There were no vitals filed for this visit.  There is no height or weight on file to calculate BMI.  General:  Alert and oriented, no acute distress HEENT: Normal Neck: No bruit or JVD Pulmonary: Clear to auscultation bilaterally Cardiac: Regular Rate and Rhythm without murmur Gastrointestinal: Soft, non-tender, non-distended, no mass Skin: No rash Extremity Pulses:  2+ radial, brachial, femoral, dorsalis pedis bilaterally Musculoskeletal: No deformity or edema  Neurologic: Upper and lower extremity motor 5/5 and symmetric  DATA: I reviewed a carotid duplex scan today from [facility]. This is dated 05/09/2012. This shows a 60-80% right internal carotid artery stenosis. There is a high-grade greater than 80% left internal carotid artery stenosis. There is  turbulent flow in the distal internal carotid artery. It is difficult to interpret whether there is a distal endpoint.  Recent Myoview scan shows no cardiac ischemia with normal ejection fraction   ASSESSMENT: Asymptomatic high-grade left internal carotid artery stenosis   PLAN:  We will stop the patient's Xarelto on November 4. He will have a carotid angiogram to define the distal extent of the stenosis on November 8. He will undergo left carotid endarterectomy on November 11. Risks benefits possible complications and procedure details were   explained the patient today. These include but are not limited to bleeding infection cranial nerve injury stroke myocardial events. He understands and agrees to proceed. We will also place a closure device at the time of his arteriogram due to the fact that he states he has restless legs in may not be able to lie in bed for that long of the time.  Of note I also discussed with the patient that he should have an aortic ultrasound due to his family history of aneurysm in his son. He states that he has an ultrasound scheduled with his primary care physician in the near future.   Charles Fields, MD [facility] [place] [place] Office: [phone] Pager: [phone]  

## 2012-05-13 ENCOUNTER — Ambulatory Visit (HOSPITAL_COMMUNITY): Payer: Medicare Other | Attending: Internal Medicine

## 2012-05-13 DIAGNOSIS — I10 Essential (primary) hypertension: Secondary | ICD-10-CM | POA: Insufficient documentation

## 2012-05-13 DIAGNOSIS — Z87891 Personal history of nicotine dependence: Secondary | ICD-10-CM | POA: Insufficient documentation

## 2012-05-13 DIAGNOSIS — E785 Hyperlipidemia, unspecified: Secondary | ICD-10-CM | POA: Insufficient documentation

## 2012-05-13 DIAGNOSIS — R0989 Other specified symptoms and signs involving the circulatory and respiratory systems: Secondary | ICD-10-CM | POA: Insufficient documentation

## 2012-05-13 DIAGNOSIS — I517 Cardiomegaly: Secondary | ICD-10-CM | POA: Insufficient documentation

## 2012-05-13 DIAGNOSIS — E119 Type 2 diabetes mellitus without complications: Secondary | ICD-10-CM

## 2012-05-13 DIAGNOSIS — C3490 Malignant neoplasm of unspecified part of unspecified bronchus or lung: Secondary | ICD-10-CM | POA: Insufficient documentation

## 2012-05-13 DIAGNOSIS — I4891 Unspecified atrial fibrillation: Secondary | ICD-10-CM

## 2012-05-13 NOTE — Progress Notes (Signed)
Echocardiogram performed.  

## 2012-05-16 ENCOUNTER — Encounter (HOSPITAL_COMMUNITY): Payer: Self-pay | Admitting: Pharmacy Technician

## 2012-05-16 ENCOUNTER — Other Ambulatory Visit: Payer: Self-pay

## 2012-05-17 ENCOUNTER — Telehealth: Payer: Self-pay

## 2012-05-17 NOTE — Telephone Encounter (Signed)
Message copied by Charna Elizabeth on Tue May 17, 2012  6:26 PM ------      Message from: Phillips Odor      Created: Mon May 16, 2012 10:39 AM      Regarding: Xarelto       Please check with Dr. Sw[place] about approval to have pt. hold Xarelto as of 05/23/12 per recommendation of Dr. Fabienne Bruns;  Pt. is scheduled for a Carotid Angiogram on 05/27/12, and a Left Carotid Endarterectomy on 05/30/12.

## 2012-05-18 NOTE — Telephone Encounter (Signed)
Per  Dr. Patty Sermons (who discussed with Darlyn Chamber. Pharm D) washout is 24 hours for Xarelto.  Will have patient take Xarelto up to and including 05/25/12. None on 05/26/12 or 05/27/12. Will have angiogram on 05/27/12. Take Xarelto on 05/28/12, none on 05/29/12 and surgery on 05/30/12.    Advised patient and will forward to Vira Blanco.

## 2012-05-18 NOTE — Telephone Encounter (Signed)
Thanks

## 2012-05-19 ENCOUNTER — Other Ambulatory Visit: Payer: Self-pay

## 2012-05-20 ENCOUNTER — Encounter (INDEPENDENT_AMBULATORY_CARE_PROVIDER_SITE_OTHER): Payer: Medicare Other

## 2012-05-20 DIAGNOSIS — I4891 Unspecified atrial fibrillation: Secondary | ICD-10-CM

## 2012-05-20 DIAGNOSIS — E119 Type 2 diabetes mellitus without complications: Secondary | ICD-10-CM

## 2012-05-20 DIAGNOSIS — I700 Atherosclerosis of aorta: Secondary | ICD-10-CM

## 2012-05-20 DIAGNOSIS — R0989 Other specified symptoms and signs involving the circulatory and respiratory systems: Secondary | ICD-10-CM

## 2012-05-20 DIAGNOSIS — E785 Hyperlipidemia, unspecified: Secondary | ICD-10-CM

## 2012-05-20 DIAGNOSIS — I10 Essential (primary) hypertension: Secondary | ICD-10-CM

## 2012-05-26 MED ORDER — DEXTROSE 5 % IV SOLN
1.5000 g | INTRAVENOUS | Status: DC
  Filled 2012-05-26: qty 1.5

## 2012-05-27 ENCOUNTER — Ambulatory Visit (HOSPITAL_COMMUNITY)
Admission: RE | Admit: 2012-05-27 | Discharge: 2012-05-27 | Disposition: A | Discharge status: Home / Self Care | Payer: Medicare Other | Source: Ambulatory Visit | Attending: Vascular Surgery | Admitting: Vascular Surgery

## 2012-05-27 ENCOUNTER — Encounter (HOSPITAL_COMMUNITY)
Admission: RE | Disposition: A | Discharge status: Home / Self Care | Payer: Self-pay | Source: Ambulatory Visit | Attending: Vascular Surgery

## 2012-05-27 ENCOUNTER — Encounter (HOSPITAL_COMMUNITY): Payer: Medicare Other

## 2012-05-27 ENCOUNTER — Encounter (HOSPITAL_COMMUNITY): Payer: Self-pay

## 2012-05-27 DIAGNOSIS — E119 Type 2 diabetes mellitus without complications: Secondary | ICD-10-CM | POA: Insufficient documentation

## 2012-05-27 DIAGNOSIS — Z85118 Personal history of other malignant neoplasm of bronchus and lung: Secondary | ICD-10-CM | POA: Insufficient documentation

## 2012-05-27 DIAGNOSIS — Z01812 Encounter for preprocedural laboratory examination: Secondary | ICD-10-CM | POA: Insufficient documentation

## 2012-05-27 DIAGNOSIS — Z7901 Long term (current) use of anticoagulants: Secondary | ICD-10-CM | POA: Insufficient documentation

## 2012-05-27 DIAGNOSIS — I6529 Occlusion and stenosis of unspecified carotid artery: Secondary | ICD-10-CM

## 2012-05-27 DIAGNOSIS — I10 Essential (primary) hypertension: Secondary | ICD-10-CM | POA: Insufficient documentation

## 2012-05-27 DIAGNOSIS — I658 Occlusion and stenosis of other precerebral arteries: Secondary | ICD-10-CM | POA: Insufficient documentation

## 2012-05-27 DIAGNOSIS — E785 Hyperlipidemia, unspecified: Secondary | ICD-10-CM | POA: Insufficient documentation

## 2012-05-27 DIAGNOSIS — N400 Enlarged prostate without lower urinary tract symptoms: Secondary | ICD-10-CM | POA: Insufficient documentation

## 2012-05-27 HISTORY — PX: ARCH AORTOGRAM: SHX5501

## 2012-05-27 HISTORY — PX: CAROTID ANGIOGRAM: SHX5504

## 2012-05-27 LAB — TYPE AND SCREEN

## 2012-05-27 LAB — CBC
HCT: 41.5 % (ref 39.0–52.0)
Platelets: 167 10*3/uL (ref 150–400)
RDW: 13.4 % (ref 11.5–15.5)
WBC: 8.7 10*3/uL (ref 4.0–10.5)

## 2012-05-27 LAB — URINALYSIS, ROUTINE W REFLEX MICROSCOPIC
Bilirubin Urine: NEGATIVE
Nitrite: NEGATIVE
Specific Gravity, Urine: 1.038 — ABNORMAL HIGH (ref 1.005–1.030)
pH: 8 (ref 5.0–8.0)

## 2012-05-27 LAB — POCT I-STAT, CHEM 8
Calcium, Ion: 1.24 mmol/L (ref 1.13–1.30)
Chloride: 102 mEq/L (ref 96–112)
Creatinine, Ser: 1 mg/dL (ref 0.50–1.35)
Glucose, Bld: 132 mg/dL — ABNORMAL HIGH (ref 70–99)
HCT: 46 % (ref 39.0–52.0)

## 2012-05-27 LAB — GLUCOSE, CAPILLARY
Glucose-Capillary: 124 mg/dL — ABNORMAL HIGH (ref 70–99)
Glucose-Capillary: 114 mg/dL — ABNORMAL HIGH (ref 70–99)

## 2012-05-27 LAB — COMPREHENSIVE METABOLIC PANEL
Albumin: 3.5 g/dL (ref 3.5–5.2)
BUN: 12 mg/dL (ref 6–23)
Creatinine, Ser: 0.83 mg/dL (ref 0.50–1.35)
Total Protein: 6.5 g/dL (ref 6.0–8.3)

## 2012-05-27 LAB — SURGICAL PCR SCREEN
MRSA, PCR: NEGATIVE
Staphylococcus aureus: NEGATIVE

## 2012-05-27 LAB — PROTIME-INR: INR: 1.04 (ref 0.00–1.49)

## 2012-05-27 SURGERY — CAROTID ANGIOGRAM
Anesthesia: LOCAL

## 2012-05-27 MED ORDER — LIDOCAINE HCL (PF) 1 % IJ SOLN
INTRAMUSCULAR | Status: AC
  Filled 2012-05-27: qty 30

## 2012-05-27 MED ORDER — HEPARIN (PORCINE) IN NACL 2-0.9 UNIT/ML-% IJ SOLN
INTRAMUSCULAR | Status: AC
  Filled 2012-05-27: qty 500

## 2012-05-27 MED ORDER — SODIUM CHLORIDE 0.9 % IV SOLN
INTRAVENOUS | Status: DC
  Administered 2012-05-27: 07:00:00 via INTRAVENOUS

## 2012-05-27 MED ORDER — SODIUM CHLORIDE 0.45 % IV SOLN
INTRAVENOUS | Status: DC
  Administered 2012-05-27: 09:00:00 via INTRAVENOUS

## 2012-05-27 NOTE — Pre-Procedure Instructions (Signed)
20 [name]  05/27/2012   Your procedure is scheduled on:  05/30/2012  Report to [facility] at 5:30 AM.  Call this number if you have problems the morning of surgery: [phone]   Remember:   Do not eat food or drink fluid:After Midnight. On Sunday      Take these medicines the morning of surgery with A SIP OF WATER: Aspirin   Do not wear jewelry  Do not wear lotions, powders, or perfumes. You may wear deodorant.                Men may shave face and neck.  Do not bring valuables to the hospital.  Contacts, dentures or bridgework may not be worn into surgery.  Leave suitcase in the car. After surgery it may be brought to your room.  For patients admitted to the hospital, checkout time is 11:00 AM the day of discharge.   Patients discharged the day of surgery will not be allowed to drive home.  Name and phone number of your driver: /w wife    Special Instructions: Shower using CHG 2 nights before surgery and the night before surgery.  If you shower the day of surgery use CHG.  Use special wash - you have one bottle of CHG for all showers.  You should use approximately 1/3 of the bottle for each shower.   Please read over the following fact sheets that you were given: Pain Booklet, Coughing and Deep Breathing, Blood Transfusion Information, MRSA Information and Surgical Site Infection Prevention

## 2012-05-27 NOTE — H&P (View-Only) (Signed)
History of Present Illness:  Patient is a 78 y.o. year old male who presents for evaluation of carotid stenosis.  The patient denies symptoms of TIA, amaurosis, or stroke.  The patient is currently on Xarelto for atrial fibrillation.  His aspirin was stopped when he was placed on Xarelto. The carotid stenosis was found on screening duplex for a bruit.  Other medical problems include hyperlipidemia, hypertension, diabetes, arthritis.  These are currently stable and followed by his primary care physician.  Past Medical History  Diagnosis Date  . HLD (hyperlipidemia)   . HTN (hypertension)   . Diabetes mellitus type II   . BPH (benign prostatic hypertrophy)   . Cancer     lung cancer dx'd 2002  . Arthritis   . Abdominal bruit 05/05/2012    Past Surgical History  Procedure Date  . Hemorrhoid surgery 1970  . Cataract extraction     right  . Retinal detachment surgery     right  . Wedge resection 03/15/01    LUL [facility])  . Lobectomy 03/15/01    LUL [facility])  . Spine surgery approx 2006    Ruptered Disc  . Total knee arthroplasty     x2 per Dr. Rayburn Ma     Social History History  Substance Use Topics  . Smoking status: Former Smoker -- 1.0 packs/day for 32 years    Types: Cigarettes    Quit date: 09/03/1982  . Smokeless tobacco: Never Used  . Alcohol Use: No    Family History Family History  Problem Relation Age of Onset  . Emphysema Father   . Dementia Mother   . Diabetes Mother   . Hypertension Mother   . Hypothyroidism Sister   . Prostate cancer Neg Hx   . Colon cancer Neg Hx   . Dementia Brother    Abdominal aortic aneurysm son  Allergies  No Known Allergies   Current Outpatient Prescriptions  Medication Sig Dispense Refill  . amLODipine (NORVASC) 5 MG tablet Take one and one half tablets (=7.5 mg) by mouth at bedtime.  45 tablet  11  . aspirin 325 MG tablet Take 325 mg by mouth 3 (three) times a week.       Marland Kitchen atorvastatin (LIPITOR) 80 MG tablet  Take 80 mg by mouth at bedtime.      . cetirizine (ZYRTEC) 10 MG tablet Take 10 mg by mouth daily as needed.       . Cinnamon 500 MG capsule Take 500 mg by mouth daily.      . clobetasol (TEMOVATE) 0.05 % ointment Apply 1 application topically 2 (two) times daily as needed.       . fish oil-omega-3 fatty acids 1000 MG capsule Take 1 g by mouth 3 (three) times daily.        . hydrochlorothiazide (MICROZIDE) 12.5 MG capsule TAKE ONE CAPSULE BY MOUTH IN THE MORNING  90 capsule  2  . HYDROcodone-acetaminophen (NORCO) 5-325 MG per tablet Take 1 tablet by mouth every 8 (eight) hours as needed.  50 tablet  1  . methocarbamol (ROBAXIN) 500 MG tablet Take 500 mg by mouth 4 (four) times daily as needed.      . Multiple Vitamins-Minerals (CENTRUM) tablet Take 1 tablet by mouth daily.        . Naproxen Sodium (ALEVE) 220 MG CAPS Take 1 capsule by mouth daily as needed.      . psyllium (REGULOID) 0.52 G capsule Take 0.52 g by mouth daily.      Marland Kitchen  ramipril (ALTACE) 10 MG capsule TAKE ONE CAPSULE BY MOUTH EVERY DAY  30 capsule  6  . Rivaroxaban (XARELTO) 20 MG TABS Take 1 tablet (20 mg total) by mouth daily.  30 tablet  6  . Tamsulosin HCl (FLOMAX) 0.4 MG CAPS Take 1 capsule (0.4 mg total) by mouth daily.  30 capsule  6   No current facility-administered medications for this visit.   Facility-Administered Medications Ordered in Other Visits  Medication Dose Route Frequency Provider Last Rate Last Dose  . regadenoson (LEXISCAN) injection SOLN 0.4 mg  0.4 mg Intravenous Once Wendall Stade, MD   0.4 mg at 05/11/12 1349  . technetium sestamibi generic (CARDIOLITE) injection 10 milli Curie  10 milli Curie Intravenous Once PRN Wendall Stade, MD   10 milli Curie at 05/11/12 1230  . technetium sestamibi generic (CARDIOLITE) injection 30 milli Curie  30 milli Curie Intravenous Once PRN Wendall Stade, MD   30 milli Curie at 05/11/12 1350    ROS:   General:  No weight loss, Fever, chills  HEENT: No recent  headaches, no nasal bleeding, no visual changes, no sore throat  Neurologic: No dizziness, blackouts, seizures. No recent symptoms of stroke or mini- stroke. No recent episodes of slurred speech, or temporary blindness.  Cardiac: No recent episodes of chest pain/pressure, no shortness of breath at rest.  + shortness of breath with exertion.  + history of atrial fibrillation or irregular heartbeat  Vascular: No history of rest pain in feet.  No history of claudication.  No history of non-healing ulcer, No history of DVT   Pulmonary: No home oxygen, no productive cough, no hemoptysis,  No asthma or wheezing  Musculoskeletal:  [ ]  Arthritis, [x ] Low back pain,  [ ]  Joint pain  Hematologic:No history of hypercoagulable state.  No history of easy bleeding.  No history of anemia  Gastrointestinal: No hematochezia or melena,  No gastroesophageal reflux, no trouble swallowing  Urinary: [ ]  chronic Kidney disease, [ ]  on HD - [ ]  MWF or [ ]  TTHS, [ ]  Burning with urination, [ ]  Frequent urination, [ ]  Difficulty urinating;   Skin: No rashes  Psychological: No history of anxiety,  No history of depression   Physical Examination  There were no vitals filed for this visit.  There is no height or weight on file to calculate BMI.  General:  Alert and oriented, no acute distress HEENT: Normal Neck: No bruit or JVD Pulmonary: Clear to auscultation bilaterally Cardiac: Regular Rate and Rhythm without murmur Gastrointestinal: Soft, non-tender, non-distended, no mass Skin: No rash Extremity Pulses:  2+ radial, brachial, femoral, dorsalis pedis bilaterally Musculoskeletal: No deformity or edema  Neurologic: Upper and lower extremity motor 5/5 and symmetric  DATA: I reviewed a carotid duplex scan today from [facility]. This is dated 05/09/2012. This shows a 60-80% right internal carotid artery stenosis. There is a high-grade greater than 80% left internal carotid artery stenosis. There is  turbulent flow in the distal internal carotid artery. It is difficult to interpret whether there is a distal endpoint.  Recent Myoview scan shows no cardiac ischemia with normal ejection fraction   ASSESSMENT: Asymptomatic high-grade left internal carotid artery stenosis   PLAN:  We will stop the patient's Xarelto on November 4. He will have a carotid angiogram to define the distal extent of the stenosis on November 8. He will undergo left carotid endarterectomy on November 11. Risks benefits possible complications and procedure details were  explained the patient today. These include but are not limited to bleeding infection cranial nerve injury stroke myocardial events. He understands and agrees to proceed. We will also place a closure device at the time of his arteriogram due to the fact that he states he has restless legs in may not be able to lie in bed for that long of the time.  Of note I also discussed with the patient that he should have an aortic ultrasound due to his family history of aneurysm in his son. He states that he has an ultrasound scheduled with his primary care physician in the near future.   Fabienne Bruns, MD [facility] [place] Office: [phone] Pager: [phone]

## 2012-05-27 NOTE — Interval H&P Note (Signed)
History and Physical Interval Note:  05/27/2012 7:37 AM  [name]  has presented today for surgery, with the diagnosis of carotid stenosis  The various methods of treatment have been discussed with the patient and family. After consideration of risks, benefits and other options for treatment, the patient has consented to  Procedure(s) (LRB) with comments: CAROTID ANGIOGRAM (N/A) as a surgical intervention .  The patient's history has been reviewed, patient examined, no change in status, stable for surgery.  I have reviewed the patient's chart and labs.  Questions were answered to the patient's satisfaction.     Jaramiah Bossard E

## 2012-05-27 NOTE — Op Note (Signed)
Procedure: Arch aortogram with bilateral carotid angiogram Preoperative diagnosis: High grade right internal carotid artery stenosis  Postoperative diagnosis: Same  Anesthesia: Local   Operative details: After obtaining informed consent, the patient was taken to the PV lab. The patient was placed in supine position the Angio table. Both groins were prepped and draped in usual sterile fashion. Local anesthesia was infiltrated over the right common femoral artery. Intitally, the vein was inadvertantly cannulated.  The needle was pulled back and moved more lateral the needle was placed into the right common femoral artery and there was good backbleeding from this.  Next and 0.035 Versacore wire was threaded up into the abdominal aorta and into the aortic arch under fluoroscopic guidance. A 5 French sheath was  the guidewire the right common femoral artery. This was thoroughly flushed with heparinized saline. A 5 French pigtail catheter was then placed through the guidewire advanced up in the aortic arch and arch aortogram obtained in a 40 LAO view. This shows normal arch configuration with a type III arch. The right vertebral artery is patent with antegrade flow. The innominate artery is patent with no narrowing at the origin. The right common carotid artery is patent with no narrowing. The left common carotid artery is patent with no narrowing. The left subclavian artery is patent with no stenosis. The left vertebral artery is patent with antegrade flow.  The 5 French pigtail catheter was then pulled back over the guidewire and exchanged for a Simmons 1 catheter. This was then used to selectively catheterize the innominate artery followed by the right common carotid.  The catheter then slipped back into the arch due to tortuosity.  The catheter was again engaged into the right common carotid and the wire advanced to the hyoid level and the simmons 1 exchanged for a straight 5 fr slip cath. Injection was then  performed through the right common carotid artery. Projections were done in AP and lateral projection. Intracranial views were also performed to be interpreted later by the neuroradiologist. The views showed 60% stenosis of the right carotid bifurcation. The right internal and external carotid arteries are otherwise patent.  Next the [facility] 1 catheter was pulled back over guidewire and after some manipulation I was able to advance this into the left common carotid artery. Usual frequent flushing procedures were performed.  After advancing the catheter into the left common carotid artery left carotid injection was performed also in AP and lateral projection. Intracranial views were also performed in AP and lateral projection to be interpreted by the neuroradiologist. On the left side there is a 95% focal stenosis.  The bifurcation is above the level of the hyoid but well below the angle of the mandible. The left internal and external carotid arteries are otherwise patent.  Next, the [facility] 1 catheter was pulled back over the guidewire and out. The 5 French sheath was thoroughly flushed with heparinized saline. The patient was taken to the holding area in stable condition.  The patient tolerated the procedure well and there were no neurologic complications. The patient was transported to the holding area in stable condition.   Operative findings: #1 Normal arch anatomy Type III.                                 #2  95% stenosis left internal carotid artery                                  #  3  60% right internal carotid artery stenosis   Disposition: The patient will be scheduled for left carotid endarterectomy 05/30/12  Fabienne Bruns, MD  [facility]  Office: [phone]  Pager: [phone]

## 2012-05-29 MED ORDER — SODIUM CHLORIDE 0.9 % IV SOLN: INTRAVENOUS | Status: DC

## 2012-05-30 ENCOUNTER — Encounter (HOSPITAL_COMMUNITY)
Admission: RE | Disposition: A | Discharge status: Home / Self Care | Payer: Self-pay | Source: Ambulatory Visit | Attending: Vascular Surgery

## 2012-05-30 ENCOUNTER — Encounter (HOSPITAL_COMMUNITY): Payer: Self-pay | Admitting: Anesthesiology

## 2012-05-30 ENCOUNTER — Encounter (HOSPITAL_COMMUNITY): Payer: Self-pay | Admitting: *Deleted

## 2012-05-30 ENCOUNTER — Ambulatory Visit (HOSPITAL_COMMUNITY): Payer: Medicare Other | Admitting: Anesthesiology

## 2012-05-30 ENCOUNTER — Inpatient Hospital Stay (HOSPITAL_COMMUNITY)
Admission: RE | Admit: 2012-05-30 | Discharge: 2012-05-31 | DRG: 039 | Disposition: A | Discharge status: Home / Self Care | Payer: Medicare Other | Source: Ambulatory Visit | Attending: Vascular Surgery | Admitting: Vascular Surgery

## 2012-05-30 DIAGNOSIS — Z79899 Other long term (current) drug therapy: Secondary | ICD-10-CM

## 2012-05-30 DIAGNOSIS — Z7982 Long term (current) use of aspirin: Secondary | ICD-10-CM

## 2012-05-30 DIAGNOSIS — Z8042 Family history of malignant neoplasm of prostate: Secondary | ICD-10-CM

## 2012-05-30 DIAGNOSIS — Z96659 Presence of unspecified artificial knee joint: Secondary | ICD-10-CM

## 2012-05-30 DIAGNOSIS — H5710 Ocular pain, unspecified eye: Secondary | ICD-10-CM | POA: Diagnosis not present

## 2012-05-30 DIAGNOSIS — Z8249 Family history of ischemic heart disease and other diseases of the circulatory system: Secondary | ICD-10-CM

## 2012-05-30 DIAGNOSIS — I6529 Occlusion and stenosis of unspecified carotid artery: Principal | ICD-10-CM | POA: Diagnosis present

## 2012-05-30 DIAGNOSIS — N400 Enlarged prostate without lower urinary tract symptoms: Secondary | ICD-10-CM | POA: Diagnosis present

## 2012-05-30 DIAGNOSIS — Z85118 Personal history of other malignant neoplasm of bronchus and lung: Secondary | ICD-10-CM

## 2012-05-30 DIAGNOSIS — Z7901 Long term (current) use of anticoagulants: Secondary | ICD-10-CM

## 2012-05-30 DIAGNOSIS — E119 Type 2 diabetes mellitus without complications: Secondary | ICD-10-CM | POA: Diagnosis present

## 2012-05-30 DIAGNOSIS — I10 Essential (primary) hypertension: Secondary | ICD-10-CM | POA: Diagnosis present

## 2012-05-30 DIAGNOSIS — I4891 Unspecified atrial fibrillation: Secondary | ICD-10-CM | POA: Diagnosis present

## 2012-05-30 DIAGNOSIS — Z800 Family history of malignant neoplasm of digestive organs: Secondary | ICD-10-CM

## 2012-05-30 DIAGNOSIS — Z87891 Personal history of nicotine dependence: Secondary | ICD-10-CM

## 2012-05-30 DIAGNOSIS — Z836 Family history of other diseases of the respiratory system: Secondary | ICD-10-CM

## 2012-05-30 DIAGNOSIS — Z833 Family history of diabetes mellitus: Secondary | ICD-10-CM

## 2012-05-30 DIAGNOSIS — E785 Hyperlipidemia, unspecified: Secondary | ICD-10-CM | POA: Diagnosis present

## 2012-05-30 HISTORY — PX: ENDARTERECTOMY: SHX5162

## 2012-05-30 HISTORY — PX: CAROTID ENDARTERECTOMY: SUR193

## 2012-05-30 LAB — GLUCOSE, CAPILLARY: Glucose-Capillary: 132 mg/dL — ABNORMAL HIGH (ref 70–99)

## 2012-05-30 SURGERY — ENDARTERECTOMY, CAROTID
Anesthesia: General | Site: Neck | Laterality: Left | Wound class: Clean

## 2012-05-30 MED ORDER — LIDOCAINE HCL (PF) 1 % IJ SOLN
INTRAMUSCULAR | Status: AC
  Filled 2012-05-30: qty 30

## 2012-05-30 MED ORDER — PHENOL 1.4 % MT LIQD: 1.0000 | OROMUCOSAL | Status: DC | PRN

## 2012-05-30 MED ORDER — SODIUM CHLORIDE 0.9 % IV SOLN
INTRAVENOUS | Status: DC
  Administered 2012-05-30 (×2): via INTRAVENOUS

## 2012-05-30 MED ORDER — GLYCOPYRROLATE 0.2 MG/ML IJ SOLN
INTRAMUSCULAR | Status: DC | PRN
  Administered 2012-05-30: 0.4 mg via INTRAVENOUS

## 2012-05-30 MED ORDER — RAMIPRIL 10 MG PO CAPS
10.0000 mg | ORAL_CAPSULE | Freq: Every day | ORAL | Status: DC
  Administered 2012-05-31: 10 mg via ORAL
  Filled 2012-05-30: qty 1

## 2012-05-30 MED ORDER — TAMSULOSIN HCL 0.4 MG PO CAPS
0.4000 mg | ORAL_CAPSULE | Freq: Every day | ORAL | Status: DC
  Administered 2012-05-30 – 2012-05-31 (×2): 0.4 mg via ORAL
  Filled 2012-05-30 (×2): qty 1

## 2012-05-30 MED ORDER — EPHEDRINE SULFATE 50 MG/ML IJ SOLN
INTRAMUSCULAR | Status: DC | PRN
  Administered 2012-05-30 (×2): 10 mg via INTRAVENOUS
  Administered 2012-05-30 (×4): 5 mg via INTRAVENOUS

## 2012-05-30 MED ORDER — GUAIFENESIN-DM 100-10 MG/5ML PO SYRP: 15.0000 mL | ORAL_SOLUTION | ORAL | Status: DC | PRN

## 2012-05-30 MED ORDER — ASPIRIN EC 325 MG PO TBEC: 325.0000 mg | DELAYED_RELEASE_TABLET | Freq: Every day | ORAL | Status: DC

## 2012-05-30 MED ORDER — ACETAMINOPHEN 650 MG RE SUPP: 325.0000 mg | RECTAL | Status: DC | PRN

## 2012-05-30 MED ORDER — OXYCODONE-ACETAMINOPHEN 5-325 MG PO TABS: 1.0000 | ORAL_TABLET | ORAL | Status: DC | PRN

## 2012-05-30 MED ORDER — NAPROXEN SODIUM 220 MG PO TABS: 220.0000 mg | ORAL_TABLET | Freq: Every day | ORAL | Status: DC | PRN

## 2012-05-30 MED ORDER — RIVAROXABAN 20 MG PO TABS
20.0000 mg | ORAL_TABLET | Freq: Every day | ORAL | Status: DC
  Administered 2012-05-30: 20 mg via ORAL
  Filled 2012-05-30 (×2): qty 1

## 2012-05-30 MED ORDER — CEFAZOLIN SODIUM-DEXTROSE 2-3 GM-% IV SOLR
INTRAVENOUS | Status: DC | PRN
  Administered 2012-05-30: 2 g via INTRAVENOUS

## 2012-05-30 MED ORDER — AMLODIPINE BESYLATE 2.5 MG PO TABS
7.5000 mg | ORAL_TABLET | Freq: Every day | ORAL | Status: DC
  Filled 2012-05-30: qty 1

## 2012-05-30 MED ORDER — OXYCODONE HCL 5 MG PO TABS: 5.0000 mg | ORAL_TABLET | Freq: Once | ORAL | Status: AC | PRN

## 2012-05-30 MED ORDER — CEFAZOLIN SODIUM-DEXTROSE 2-3 GM-% IV SOLR
INTRAVENOUS | Status: AC
  Filled 2012-05-30: qty 50

## 2012-05-30 MED ORDER — DOCUSATE SODIUM 100 MG PO CAPS
100.0000 mg | ORAL_CAPSULE | Freq: Every day | ORAL | Status: DC
  Administered 2012-05-31: 100 mg via ORAL
  Filled 2012-05-30: qty 1

## 2012-05-30 MED ORDER — ASPIRIN EC 325 MG PO TBEC
325.0000 mg | DELAYED_RELEASE_TABLET | Freq: Every day | ORAL | Status: DC
  Administered 2012-05-31: 325 mg via ORAL
  Filled 2012-05-30: qty 1

## 2012-05-30 MED ORDER — POTASSIUM CHLORIDE CRYS ER 20 MEQ PO TBCR: 20.0000 meq | EXTENDED_RELEASE_TABLET | Freq: Once | ORAL | Status: AC | PRN

## 2012-05-30 MED ORDER — PROPOFOL 10 MG/ML IV BOLUS
INTRAVENOUS | Status: DC | PRN
  Administered 2012-05-30: 140 mg via INTRAVENOUS

## 2012-05-30 MED ORDER — MAGNESIUM SULFATE 40 MG/ML IJ SOLN
2.0000 g | Freq: Once | INTRAMUSCULAR | Status: AC | PRN
  Filled 2012-05-30: qty 50

## 2012-05-30 MED ORDER — SODIUM CHLORIDE 0.9 % IV SOLN: 500.0000 mL | Freq: Once | INTRAVENOUS | Status: AC | PRN

## 2012-05-30 MED ORDER — LACTATED RINGERS IV SOLN
INTRAVENOUS | Status: DC | PRN
  Administered 2012-05-30 (×2): via INTRAVENOUS

## 2012-05-30 MED ORDER — LORATADINE 10 MG PO TABS
10.0000 mg | ORAL_TABLET | Freq: Every day | ORAL | Status: DC
  Administered 2012-05-30 – 2012-05-31 (×2): 10 mg via ORAL
  Filled 2012-05-30 (×2): qty 1

## 2012-05-30 MED ORDER — ALUM & MAG HYDROXIDE-SIMETH 200-200-20 MG/5ML PO SUSP: 15.0000 mL | ORAL | Status: DC | PRN

## 2012-05-30 MED ORDER — ATORVASTATIN CALCIUM 80 MG PO TABS
80.0000 mg | ORAL_TABLET | Freq: Every day | ORAL | Status: DC
Start: 2012-05-30 — End: 2012-05-31
  Filled 2012-05-30 (×2): qty 1

## 2012-05-30 MED ORDER — SODIUM CHLORIDE 0.9 % IR SOLN
Status: DC | PRN
  Administered 2012-05-30: 08:00:00

## 2012-05-30 MED ORDER — ALBUMIN HUMAN 5 % IV SOLN
12.5000 g | Freq: Once | INTRAVENOUS | Status: AC
  Administered 2012-05-30: 12.5 g via INTRAVENOUS

## 2012-05-30 MED ORDER — ONDANSETRON HCL 4 MG/2ML IJ SOLN
INTRAMUSCULAR | Status: DC | PRN
  Administered 2012-05-30: 4 mg via INTRAVENOUS

## 2012-05-30 MED ORDER — OXYCODONE HCL 5 MG/5ML PO SOLN: 5.0000 mg | Freq: Once | ORAL | Status: AC | PRN

## 2012-05-30 MED ORDER — POLYVINYL ALCOHOL 1.4 % OP SOLN
2.0000 [drp] | OPHTHALMIC | Status: DC | PRN
  Administered 2012-05-30 – 2012-05-31 (×2): 2 [drp] via OPHTHALMIC
  Filled 2012-05-30: qty 15

## 2012-05-30 MED ORDER — ROCURONIUM BROMIDE 100 MG/10ML IV SOLN
INTRAVENOUS | Status: DC | PRN
  Administered 2012-05-30: 50 mg via INTRAVENOUS

## 2012-05-30 MED ORDER — DOPAMINE-DEXTROSE 3.2-5 MG/ML-% IV SOLN: 3.0000 ug/kg/min | INTRAVENOUS | Status: DC

## 2012-05-30 MED ORDER — LABETALOL HCL 5 MG/ML IV SOLN: 10.0000 mg | INTRAVENOUS | Status: DC | PRN

## 2012-05-30 MED ORDER — PHENYLEPHRINE HCL 10 MG/ML IJ SOLN
10.0000 mg | INTRAVENOUS | Status: DC | PRN
  Administered 2012-05-30: 20 ug/min via INTRAVENOUS

## 2012-05-30 MED ORDER — PROMETHAZINE HCL 25 MG/ML IJ SOLN: 6.2500 mg | INTRAMUSCULAR | Status: DC | PRN

## 2012-05-30 MED ORDER — ARTIFICIAL TEARS OP OINT
TOPICAL_OINTMENT | Freq: Four times a day (QID) | OPHTHALMIC | Status: DC
  Administered 2012-05-30: 18:00:00 via OPHTHALMIC
  Filled 2012-05-30: qty 3.5

## 2012-05-30 MED ORDER — 0.9 % SODIUM CHLORIDE (POUR BTL) OPTIME
TOPICAL | Status: DC | PRN
  Administered 2012-05-30: 2000 mL

## 2012-05-30 MED ORDER — HEPARIN SODIUM (PORCINE) 1000 UNIT/ML IJ SOLN
INTRAMUSCULAR | Status: DC | PRN
  Administered 2012-05-30: 10000 [IU] via INTRAVENOUS

## 2012-05-30 MED ORDER — ONDANSETRON HCL 4 MG/2ML IJ SOLN: 4.0000 mg | Freq: Four times a day (QID) | INTRAMUSCULAR | Status: DC | PRN

## 2012-05-30 MED ORDER — VECURONIUM BROMIDE 10 MG IV SOLR
INTRAVENOUS | Status: DC | PRN
  Administered 2012-05-30: 1 mg via INTRAVENOUS
  Administered 2012-05-30: 2 mg via INTRAVENOUS
  Administered 2012-05-30: 1 mg via INTRAVENOUS

## 2012-05-30 MED ORDER — MORPHINE SULFATE 2 MG/ML IJ SOLN: 2.0000 mg | INTRAMUSCULAR | Status: DC | PRN

## 2012-05-30 MED ORDER — FENTANYL CITRATE 0.05 MG/ML IJ SOLN
INTRAMUSCULAR | Status: DC | PRN
Start: 2012-05-30 — End: 2012-05-30
  Administered 2012-05-30 (×8): 50 ug via INTRAVENOUS

## 2012-05-30 MED ORDER — PROTAMINE SULFATE 10 MG/ML IV SOLN
INTRAVENOUS | Status: DC | PRN
  Administered 2012-05-30: 10 mg via INTRAVENOUS
  Administered 2012-05-30: 40 mg via INTRAVENOUS

## 2012-05-30 MED ORDER — HYDRALAZINE HCL 20 MG/ML IJ SOLN: 10.0000 mg | INTRAMUSCULAR | Status: DC | PRN

## 2012-05-30 MED ORDER — HYDROMORPHONE HCL PF 1 MG/ML IJ SOLN: 0.2500 mg | INTRAMUSCULAR | Status: DC | PRN

## 2012-05-30 MED ORDER — SENNOSIDES-DOCUSATE SODIUM 8.6-50 MG PO TABS
1.0000 | ORAL_TABLET | Freq: Every evening | ORAL | Status: DC | PRN
  Filled 2012-05-30: qty 1

## 2012-05-30 MED ORDER — NEOSTIGMINE METHYLSULFATE 1 MG/ML IJ SOLN
INTRAMUSCULAR | Status: DC | PRN
  Administered 2012-05-30: 3 mg via INTRAVENOUS

## 2012-05-30 MED ORDER — DEXTROSE 5 % IV SOLN
1.5000 g | Freq: Two times a day (BID) | INTRAVENOUS | Status: AC
  Administered 2012-05-30 – 2012-05-31 (×2): 1.5 g via INTRAVENOUS
  Filled 2012-05-30 (×2): qty 1.5

## 2012-05-30 MED ORDER — BISACODYL 5 MG PO TBEC: 5.0000 mg | DELAYED_RELEASE_TABLET | Freq: Every day | ORAL | Status: DC | PRN

## 2012-05-30 MED ORDER — ACETAMINOPHEN 325 MG PO TABS: 325.0000 mg | ORAL_TABLET | ORAL | Status: DC | PRN

## 2012-05-30 MED ORDER — THROMBIN 20000 UNITS EX SOLR
CUTANEOUS | Status: AC
  Filled 2012-05-30: qty 20000

## 2012-05-30 MED ORDER — LIDOCAINE HCL (CARDIAC) 20 MG/ML IV SOLN
INTRAVENOUS | Status: DC | PRN
  Administered 2012-05-30: 100 mg via INTRAVENOUS

## 2012-05-30 MED ORDER — HYDROCHLOROTHIAZIDE 12.5 MG PO CAPS
12.5000 mg | ORAL_CAPSULE | Freq: Every day | ORAL | Status: DC
  Administered 2012-05-30 – 2012-05-31 (×2): 12.5 mg via ORAL
  Filled 2012-05-30 (×2): qty 1

## 2012-05-30 MED ORDER — PANTOPRAZOLE SODIUM 40 MG PO TBEC
40.0000 mg | DELAYED_RELEASE_TABLET | Freq: Every day | ORAL | Status: DC
  Administered 2012-05-30 – 2012-05-31 (×2): 40 mg via ORAL
  Filled 2012-05-30 (×2): qty 1

## 2012-05-30 MED ORDER — METOPROLOL TARTRATE 1 MG/ML IV SOLN: 2.0000 mg | INTRAVENOUS | Status: DC | PRN

## 2012-05-30 MED ORDER — NAPROXEN 250 MG PO TABS
250.0000 mg | ORAL_TABLET | Freq: Every day | ORAL | Status: DC | PRN
  Filled 2012-05-30: qty 1

## 2012-05-30 SURGICAL SUPPLY — 58 items
ADH SKN CLS APL DERMABOND .7 (GAUZE/BANDAGES/DRESSINGS) ×1
CANISTER SUCTION 2500CC (MISCELLANEOUS) ×2 IMPLANT
CANNULA VESSEL W/WING WO/VALVE (CANNULA) ×2 IMPLANT
CATH ROBINSON RED A/P 18FR (CATHETERS) ×2 IMPLANT
CLIP TI MEDIUM 24 (CLIP) ×2 IMPLANT
CLIP TI WIDE RED SMALL 24 (CLIP) ×2 IMPLANT
CLOTH BEACON ORANGE TIMEOUT ST (SAFETY) ×2 IMPLANT
COVER SURGICAL LIGHT HANDLE (MISCELLANEOUS) ×2 IMPLANT
CRADLE DONUT ADULT HEAD (MISCELLANEOUS) ×2 IMPLANT
DECANTER SPIKE VIAL GLASS SM (MISCELLANEOUS) IMPLANT
DERMABOND ADVANCED (GAUZE/BANDAGES/DRESSINGS) ×1
DERMABOND ADVANCED .7 DNX12 (GAUZE/BANDAGES/DRESSINGS) ×1 IMPLANT
DRAIN HEMOVAC 1/8 X 5 (WOUND CARE) IMPLANT
DRAPE WARM FLUID 44X44 (DRAPE) ×2 IMPLANT
ELECT REM PT RETURN 9FT ADLT (ELECTROSURGICAL) ×2
ELECTRODE REM PT RTRN 9FT ADLT (ELECTROSURGICAL) ×1 IMPLANT
EVACUATOR SILICONE 100CC (DRAIN) IMPLANT
GEL ULTRASOUND 20GR AQUASONIC (MISCELLANEOUS) IMPLANT
GLOVE BIO SURGEON STRL SZ7.5 (GLOVE) ×2 IMPLANT
GLOVE BIOGEL PI IND STRL 6.5 (GLOVE) IMPLANT
GLOVE BIOGEL PI IND STRL 7.0 (GLOVE) IMPLANT
GLOVE BIOGEL PI IND STRL 7.5 (GLOVE) IMPLANT
GLOVE BIOGEL PI INDICATOR 6.5 (GLOVE) ×2
GLOVE BIOGEL PI INDICATOR 7.0 (GLOVE) ×2
GLOVE BIOGEL PI INDICATOR 7.5 (GLOVE) ×1
GLOVE SS BIOGEL STRL SZ 7 (GLOVE) IMPLANT
GLOVE SUPERSENSE BIOGEL SZ 7 (GLOVE) ×1
GLOVE SURG SS PI 6.5 STRL IVOR (GLOVE) ×1 IMPLANT
GLOVE SURG SS PI 7.0 STRL IVOR (GLOVE) ×2 IMPLANT
GOWN PREVENTION PLUS XLARGE (GOWN DISPOSABLE) ×2 IMPLANT
GOWN STRL NON-REIN LRG LVL3 (GOWN DISPOSABLE) ×4 IMPLANT
GOWN STRL REIN XL XLG (GOWN DISPOSABLE) ×2 IMPLANT
KIT BASIN OR (CUSTOM PROCEDURE TRAY) ×2 IMPLANT
KIT ROOM TURNOVER OR (KITS) ×2 IMPLANT
LOOP VESSEL MINI RED (MISCELLANEOUS) IMPLANT
NDL HYPO 25GX1X1/2 BEV (NEEDLE) IMPLANT
NEEDLE HYPO 25GX1X1/2 BEV (NEEDLE) IMPLANT
NS IRRIG 1000ML POUR BTL (IV SOLUTION) ×4 IMPLANT
PACK CAROTID (CUSTOM PROCEDURE TRAY) ×2 IMPLANT
PAD ARMBOARD 7.5X6 YLW CONV (MISCELLANEOUS) ×4 IMPLANT
PATCH HEMASHIELD 8X75 (Vascular Products) ×1 IMPLANT
SHUNT CAROTID BYPASS 10 (VASCULAR PRODUCTS) ×1 IMPLANT
SHUNT CAROTID BYPASS 12FRX15.5 (VASCULAR PRODUCTS) IMPLANT
SPECIMEN JAR SMALL (MISCELLANEOUS) ×2 IMPLANT
SPONGE SURGIFOAM ABS GEL 100 (HEMOSTASIS) IMPLANT
SUT ETHILON 3 0 PS 1 (SUTURE) IMPLANT
SUT PROLENE 6 0 CC (SUTURE) ×2 IMPLANT
SUT PROLENE 7 0 BV 1 (SUTURE) IMPLANT
SUT SILK 3 0 (SUTURE) ×2
SUT SILK 3-0 18XBRD TIE 12 (SUTURE) IMPLANT
SUT VIC AB 3-0 SH 27 (SUTURE) ×2
SUT VIC AB 3-0 SH 27X BRD (SUTURE) ×1 IMPLANT
SUT VICRYL 4-0 PS2 18IN ABS (SUTURE) ×2 IMPLANT
SYR CONTROL 10ML LL (SYRINGE) IMPLANT
TOWEL OR 17X24 6PK STRL BLUE (TOWEL DISPOSABLE) ×2 IMPLANT
TOWEL OR 17X26 10 PK STRL BLUE (TOWEL DISPOSABLE) ×2 IMPLANT
TRAY FOLEY CATH 14FRSI W/METER (CATHETERS) ×2 IMPLANT
WATER STERILE IRR 1000ML POUR (IV SOLUTION) ×2 IMPLANT

## 2012-05-30 NOTE — Plan of Care (Signed)
Problem: Consults Goal: Diagnosis CEA/CES/AAA Stent Carotid Endarterectomy (CEA)     

## 2012-05-30 NOTE — Progress Notes (Addendum)
Returned to bedside at 1122 from transporting, and Report received from [employer].

## 2012-05-30 NOTE — Progress Notes (Signed)
Pt C/O pain in OD.  No change in vision.  Feels better with lid closed.  Evaluation shows injected sclera, but no obvious abrasion or other findings.  Pt informed that pain is probably due to slight corneal abrasion.  Plan to use natural tears and patch overnight for symptomatic relief.  This should resolved over the next 24 hours.  If it persists past 24 hours or his vision becomes affected, he will need a opthalmology consult.

## 2012-05-30 NOTE — H&P (View-Only) (Signed)
History of Present Illness:  Patient is a 78 y.o. year old male who presents for evaluation of carotid stenosis.  The patient denies symptoms of TIA, amaurosis, or stroke.  The patient is currently on Xarelto for atrial fibrillation.  His aspirin was stopped when he was placed on Xarelto. The carotid stenosis was found on screening duplex for a bruit.  Other medical problems include hyperlipidemia, hypertension, diabetes, arthritis.  These are currently stable and followed by his primary care physician.  Past Medical History  Diagnosis Date  . HLD (hyperlipidemia)   . HTN (hypertension)   . Diabetes mellitus type II   . BPH (benign prostatic hypertrophy)   . Cancer     lung cancer dx'd 2002  . Arthritis   . Abdominal bruit 05/05/2012    Past Surgical History  Procedure Date  . Hemorrhoid surgery 1970  . Cataract extraction     right  . Retinal detachment surgery     right  . Wedge resection 03/15/01    LUL ([facility])  . Lobectomy 03/15/01    LUL ([facility])  . Spine surgery approx 2006    Ruptered Disc  . Total knee arthroplasty     x2 per Dr. Blackmon     Social History History  Substance Use Topics  . Smoking status: Former Smoker -- 1.0 packs/day for 32 years    Types: Cigarettes    Quit date: 09/03/1982  . Smokeless tobacco: Never Used  . Alcohol Use: No    Family History Family History  Problem Relation Age of Onset  . Emphysema Father   . Dementia Mother   . Diabetes Mother   . Hypertension Mother   . Hypothyroidism Sister   . Prostate cancer Neg Hx   . Colon cancer Neg Hx   . Dementia Brother    Abdominal aortic aneurysm son  Allergies  No Known Allergies   Current Outpatient Prescriptions  Medication Sig Dispense Refill  . amLODipine (NORVASC) 5 MG tablet Take one and one half tablets (=7.5 mg) by mouth at bedtime.  45 tablet  11  . aspirin 325 MG tablet Take 325 mg by mouth 3 (three) times a week.       . atorvastatin (LIPITOR) 80 MG tablet  Take 80 mg by mouth at bedtime.      . cetirizine (ZYRTEC) 10 MG tablet Take 10 mg by mouth daily as needed.       . Cinnamon 500 MG capsule Take 500 mg by mouth daily.      . clobetasol (TEMOVATE) 0.05 % ointment Apply 1 application topically 2 (two) times daily as needed.       . fish oil-omega-3 fatty acids 1000 MG capsule Take 1 g by mouth 3 (three) times daily.        . hydrochlorothiazide (MICROZIDE) 12.5 MG capsule TAKE ONE CAPSULE BY MOUTH IN THE MORNING  90 capsule  2  . HYDROcodone-acetaminophen (NORCO) 5-325 MG per tablet Take 1 tablet by mouth every 8 (eight) hours as needed.  50 tablet  1  . methocarbamol (ROBAXIN) 500 MG tablet Take 500 mg by mouth 4 (four) times daily as needed.      . Multiple Vitamins-Minerals (CENTRUM) tablet Take 1 tablet by mouth daily.        . Naproxen Sodium (ALEVE) 220 MG CAPS Take 1 capsule by mouth daily as needed.      . psyllium (REGULOID) 0.52 G capsule Take 0.52 g by mouth daily.      .   ramipril (ALTACE) 10 MG capsule TAKE ONE CAPSULE BY MOUTH EVERY DAY  30 capsule  6  . Rivaroxaban (XARELTO) 20 MG TABS Take 1 tablet (20 mg total) by mouth daily.  30 tablet  6  . Tamsulosin HCl (FLOMAX) 0.4 MG CAPS Take 1 capsule (0.4 mg total) by mouth daily.  30 capsule  6   No current facility-administered medications for this visit.   Facility-Administered Medications Ordered in Other Visits  Medication Dose Route Frequency Provider Last Rate Last Dose  . regadenoson (LEXISCAN) injection SOLN 0.4 mg  0.4 mg Intravenous Once Peter C Nishan, MD   0.4 mg at 05/11/12 1349  . technetium sestamibi generic (CARDIOLITE) injection 10 milli Curie  10 milli Curie Intravenous Once PRN Peter C Nishan, MD   10 milli Curie at 05/11/12 1230  . technetium sestamibi generic (CARDIOLITE) injection 30 milli Curie  30 milli Curie Intravenous Once PRN Peter C Nishan, MD   30 milli Curie at 05/11/12 1350    ROS:   General:  No weight loss, Fever, chills  HEENT: No recent  headaches, no nasal bleeding, no visual changes, no sore throat  Neurologic: No dizziness, blackouts, seizures. No recent symptoms of stroke or mini- stroke. No recent episodes of slurred speech, or temporary blindness.  Cardiac: No recent episodes of chest pain/pressure, no shortness of breath at rest.  + shortness of breath with exertion.  + history of atrial fibrillation or irregular heartbeat  Vascular: No history of rest pain in feet.  No history of claudication.  No history of non-healing ulcer, No history of DVT   Pulmonary: No home oxygen, no productive cough, no hemoptysis,  No asthma or wheezing  Musculoskeletal:  [ ] Arthritis, [x ] Low back pain,  [ ] Joint pain  Hematologic:No history of hypercoagulable state.  No history of easy bleeding.  No history of anemia  Gastrointestinal: No hematochezia or melena,  No gastroesophageal reflux, no trouble swallowing  Urinary: [ ] chronic Kidney disease, [ ] on HD - [ ] MWF or [ ] TTHS, [ ] Burning with urination, [ ] Frequent urination, [ ] Difficulty urinating;   Skin: No rashes  Psychological: No history of anxiety,  No history of depression   Physical Examination  There were no vitals filed for this visit.  There is no height or weight on file to calculate BMI.  General:  Alert and oriented, no acute distress HEENT: Normal Neck: No bruit or JVD Pulmonary: Clear to auscultation bilaterally Cardiac: Regular Rate and Rhythm without murmur Gastrointestinal: Soft, non-tender, non-distended, no mass Skin: No rash Extremity Pulses:  2+ radial, brachial, femoral, dorsalis pedis bilaterally Musculoskeletal: No deformity or edema  Neurologic: Upper and lower extremity motor 5/5 and symmetric  DATA: I reviewed a carotid duplex scan today from [facility]. This is dated 05/09/2012. This shows a 60-80% right internal carotid artery stenosis. There is a high-grade greater than 80% left internal carotid artery stenosis. There is  turbulent flow in the distal internal carotid artery. It is difficult to interpret whether there is a distal endpoint.  Recent Myoview scan shows no cardiac ischemia with normal ejection fraction   ASSESSMENT: Asymptomatic high-grade left internal carotid artery stenosis   PLAN:  We will stop the patient's Xarelto on November 4. He will have a carotid angiogram to define the distal extent of the stenosis on November 8. He will undergo left carotid endarterectomy on November 11. Risks benefits possible complications and procedure details were   explained the patient today. These include but are not limited to bleeding infection cranial nerve injury stroke myocardial events. He understands and agrees to proceed. We will also place a closure device at the time of his arteriogram due to the fact that he states he has restless legs in may not be able to lie in bed for that long of the time.  Of note I also discussed with the patient that he should have an aortic ultrasound due to his family history of aneurysm in his son. He states that he has an ultrasound scheduled with his primary care physician in the near future.   Talyn Eddie, MD [facility] Office: [phone] Pager: [phone]  

## 2012-05-30 NOTE — Op Note (Signed)
Procedure Left carotid endarterectomy  Preoperative diagnosis: High-grade asymptomatic left internal carotid artery stenosis  Postoperative diagnosis: Same  Anesthesia General  Asst.: Lianne Cure, [facility]  Operative findings: #1 greater than 90% left internal carotid artery stenosis with granular soft plaque                                #2 Dacron patch                                #3 high bifurcation                                #4 10 Fr shunt  Operative details: After obtaining informed consent, the patient was taken to the operating room. The patient was placed in a supine position on the operating room table. After induction of general anesthesia and endotracheal intubation a Foley catheter was placed. Next the patient's entire neck and chest was prepped and draped in the usual sterile fashion. An oblique incision was made on the left aspect of the patient's neck anterior to the border the left sternocleidomastoid muscle. The incision was carried into the subcutaneous tissues and through the platysma. The sternocleidomastoid muscle was identified and reflected laterally. The omohyoid muscle was identified and this was divided with cautery. The common carotid artery was then found at the base of the incision this was dissected free circumferentially. It was soft on palpation.  The vagus nerve was identified and protected. Dissection was then carried up to the level carotid bifurcation. The Ansa cervicalis was identified and traced up to its insertion into the hypoglossal nerve. Due to a high carotid bifurcation, hyperglossal nerve was mobilized superiorly as well as the inferior edge of the digastric muscle.  This required ligating and dividing several small arterial and venous branches.  The internal carotid artery was dissected free circumferentially just below the level of the hypoglossal nerve and it was soft in character at this location and above any palpable disease. A vessel loop was  placed around this. Next the external carotid and superior thyroid arteries were dissected free circumferentially and vessel loops were placed around these. The patient was given 10000 units of intravenous heparin.  After 2 minutes of circulation time and raising the mean arterial pressure to 85 mm mercury, the distal internal carotid artery was controlled with small bulldog clamp. The external carotid and superior thyroid arteries were controlled with vessel loops. The common carotid artery was controlled with a peripheral DeBakey clamp. A longitudinal opening was made in the common carotid artery just below the bifurcation. The arteriotomy was extended distally up into the internal carotid with Potts scissors. There was a large soft granular plaque with greater than 90% stenosis in the internal carotid.The arteriotomy was extended past the level of stenosis. The shunt was inspected and found to have 2 air bubbles in it. Therefore, the shunt was pulled back down out of the internal carotid and common carotid and these were reclamped.  The internal carotid was allowed to back bleed thoroughly. The 10 Fr shunt was brought onto the field and threaded into the distal internal carotid artery and allowed to backbleed again to fully deair the shunt.  The proximal end of the shunt was then again threaded into the common carotid artery and secured with  a Rummel tourniquet.  The shunt was inspected and found to be free of air at this point and flow opened back to the brain.  Attention was then turned to the common carotid artery once again. A suitable endarterectomy plane was obtained and endarterectomy was begun in the common carotid artery and a reasonable proximal endpoint. An eversion endarterectomy was performed on the external carotid artery and a good endpoint was obtained. The plaque was then elevated in the internal carotid artery and a nice feathered distal endpoint was also obtained. The plaque was passed off the  table as a specimen. All loose debris was then removed from the carotid bed and everything was thoroughly irrigated with heparinized saline. A Dacron patch was then brought on to the operative field and this was sewn on as a patch angioplasty using a running 6-0 Prolene suture. Prior to completion of the anastomosis, the shunt was removed and the internal carotid artery and common carotid controlled with vascular clamps.  The internal carotid was thoroughly backbled. This was then controlled again with a baby Gregory clamp.  The common carotid was thoroughly flushed forward and controlled with a peripheral Debakey clamp.  The external carotid was also thoroughly backbled.  The remainder of the patch was completed and the anastomosis was secured. Flow was then restored first retrograde from the external carotid into the carotid bed then antegrade from the common carotid to the external carotid artery and after approximately 5 cardiac cycles to the internal carotid artery. Doppler was used to evaluate the external/internal and common carotid arteries and these all had good Doppler flow. Hemostasis was obtained. The patient was also given 50 mg of Protamine.    The platysma muscle was reapproximated using a running 3-0 Vicryl suture. The skin was closed with 4 0 Vicryl subcuticular stitch.  The patient was awakened in the operating room and was moving upper and lower extremities symmetrically and following commands.  The patient was stable on arrival to the PACU.  Fabienne Bruns, MD [facility] Office: [phone] Pager: [phone]

## 2012-05-30 NOTE — Progress Notes (Signed)
3300 RN unable to accept report at this time.

## 2012-05-30 NOTE — Addendum Note (Signed)
Addendum  created 05/30/12 1430 by Remonia Richter, MD   Modules edited:Inpatient Notes

## 2012-05-30 NOTE — Interval H&P Note (Signed)
History and Physical Interval Note:  05/30/2012 7:34 AM  Jonathan York  has presented today for surgery, with the diagnosis of (L) Internal Carotid Artery Stenosis   The various methods of treatment have been discussed with the patient and family. After consideration of risks, benefits and other options for treatment, the patient has consented to  Procedure(s) (LRB) with comments: ENDARTERECTOMY CAROTID (Left) as a surgical intervention .  The patient's history has been reviewed, patient examined, no change in status, stable for surgery.  I have reviewed the patient's chart and labs.  Questions were answered to the patient's satisfaction.     FIELDS,CHARLES E

## 2012-05-30 NOTE — Progress Notes (Signed)
Dr Krista Blue made aware pt has c/o rt eye pain/irritation. Eye has minor swelling, and redness.  No orders received at this time.  Follow up within 24 hrs.

## 2012-05-30 NOTE — Anesthesia Postprocedure Evaluation (Signed)
Anesthesia Post Note  Patient: Jonathan York  Procedure(s) Performed: Procedure(s) (LRB): ENDARTERECTOMY CAROTID (Left)  Anesthesia type: general  Patient location: PACU  Post pain: Pain level controlled  Post assessment: Patient's Cardiovascular Status Stable  Last Vitals:  Filed Vitals:   05/30/12 1230  BP: 94/42  Pulse: 55  Temp:   Resp: 19    Post vital signs: Reviewed and stable  Level of consciousness: sedated  Complications: No apparent anesthesia complications

## 2012-05-30 NOTE — Progress Notes (Signed)
While asleep pt HR dropping into the 30's and 40's in afib. His BP 106/52 and he is asymptomatic at this time. Dr Early made aware and no new orders given at this time. Will continue to monitor.

## 2012-05-30 NOTE — Transfer of Care (Addendum)
Immediate Anesthesia Transfer of Care Note  Patient: Jonathan York  Procedure(s) Performed: Procedure(s) (LRB) with comments: ENDARTERECTOMY CAROTID (Left)  Patient Location: PACU  Anesthesia Type:General  Level of Consciousness: awake, alert , oriented and patient cooperative  Airway & Oxygen Therapy: Patient Spontanous Breathing and Patient connected to nasal cannula oxygen  Post-op Assessment: Report given to PACU RN, Post -op Vital signs reviewed and stable and Patient moving all extremities X 4  Post vital signs: Reviewed and stable  Complications: No apparent anesthesia complications

## 2012-05-30 NOTE — Plan of Care (Signed)
Problem: Consults Goal: Diagnosis CEA/CES/AAA Stent Outcome: Completed/Met Date Met:  05/30/12 Carotid Endarterectomy (CEA)

## 2012-05-30 NOTE — Progress Notes (Signed)
1215

## 2012-05-30 NOTE — Anesthesia Preprocedure Evaluation (Addendum)
Anesthesia Evaluation  Patient identified by MRN, date of birth, ID band Patient awake    Reviewed: Allergy & Precautions, H&P , NPO status , Patient's Chart, lab work & pertinent test results  History of Anesthesia Complications Negative for: history of anesthetic complications  Airway Mallampati: I TM Distance: >3 FB     Dental  (+) Teeth Intact and Dental Advisory Given   Pulmonary shortness of breath, with exertion and lying, COPDformer smoker,  S/p LUL resection for CA   Received chemo   Pulmonary exam normal       Cardiovascular hypertension, Pt. on medications + dysrhythmias Atrial Fibrillation Rhythm:Irregular     Neuro/Psych    GI/Hepatic negative GI ROS, Neg liver ROS, (+) Cirrhosis -       ,   Endo/Other  diabetes, Well Controlled  Renal/GU negative Renal ROS   BPH    Musculoskeletal negative musculoskeletal ROS (+)   Abdominal   Peds  Hematology negative hematology ROS (+)   Anesthesia Other Findings   Reproductive/Obstetrics negative OB ROS                        Anesthesia Physical Anesthesia Plan  ASA: III  Anesthesia Plan: General   Post-op Pain Management:    Induction: Intravenous  Airway Management Planned: Oral ETT  Additional Equipment: Arterial line  Intra-op Plan:   Post-operative Plan: Extubation in OR  Informed Consent: I have reviewed the patients History and Physical, chart, labs and discussed the procedure including the risks, benefits and alternatives for the proposed anesthesia with the patient or authorized representative who has indicated his/her understanding and acceptance.   Dental advisory given  Plan Discussed with: CRNA, Anesthesiologist and Surgeon  Anesthesia Plan Comments:        Anesthesia Quick Evaluation

## 2012-05-30 NOTE — Progress Notes (Signed)
Dr Darrick Penna at bedside, and aware pt has c/o rt eye irritation.  Order received for artificial tears.  Will monitor.

## 2012-05-30 NOTE — Progress Notes (Signed)
Dr Krista Blue aware Bp 88/35. Order received to give albumin 250 cc's.

## 2012-05-31 ENCOUNTER — Telehealth: Payer: Self-pay | Admitting: Vascular Surgery

## 2012-05-31 ENCOUNTER — Encounter (HOSPITAL_COMMUNITY): Payer: Self-pay | Admitting: Vascular Surgery

## 2012-05-31 LAB — CBC
HCT: 35.6 % — ABNORMAL LOW (ref 39.0–52.0)
Hemoglobin: 12.2 g/dL — ABNORMAL LOW (ref 13.0–17.0)
MCH: 28.7 pg (ref 26.0–34.0)
MCV: 83.8 fL (ref 78.0–100.0)
RBC: 4.25 MIL/uL (ref 4.22–5.81)

## 2012-05-31 LAB — BASIC METABOLIC PANEL
BUN: 18 mg/dL (ref 6–23)
CO2: 24 mEq/L (ref 19–32)
Calcium: 8.4 mg/dL (ref 8.4–10.5)
Glucose, Bld: 129 mg/dL — ABNORMAL HIGH (ref 70–99)
Sodium: 135 mEq/L (ref 135–145)

## 2012-05-31 NOTE — Discharge Summary (Signed)
[facility] Discharge Summary   Patient ID:  Jonathan York MRN: [id number] DOB/AGE: 11-06-1932 78 y.o.  Admit date: 05/30/2012 Discharge date: 05/31/2012 Date of Surgery: 05/30/2012 Surgeon: Surgeon(s): Sherren Kerns, MD  Admission Diagnosis: L Internal Carotid Artery Stenosis   Discharge Diagnoses:  L Internal Carotid Artery Stenosis   Secondary Diagnoses: Past Medical History  Diagnosis Date  . HLD (hyperlipidemia)   . HTN (hypertension)   . BPH (benign prostatic hypertrophy)   . Cancer     lung cancer dx'd 2002  . Abdominal bruit 05/05/2012  . Irregular heart beat   . Diabetes mellitus type II     diet controlled   . Dysrhythmia     a-fib  . Shortness of breath     /w exertion only  . Arthritis     back pain, past lumbar surgery     Procedure(s): ENDARTERECTOMY CAROTID  Discharged Condition: good  HPI: Patient is a 78 y.o. year old male who presents for evaluation of carotid stenosis. The patient denies symptoms of TIA, amaurosis, or stroke. The patient is currently on Xarelto for atrial fibrillation. His aspirin was stopped when he was placed on Xarelto. The carotid stenosis was found on screening duplex for a bruit. Other medical problems include hyperlipidemia, hypertension, diabetes, arthritis. These are currently stable and followed by his primary care physician.     Hospital Course:  Jonathan York is a 78 y.o. male is S/P Left Procedure(s): ENDARTERECTOMY CAROTID Extubated: POD # 0 Post-op wounds healing well Pt. Ambulating, voiding and taking PO diet without difficulty. Pt pain controlled with PO pain meds. Labs as below Complications:none  Consults:     Significant Diagnostic Studies: CBC Lab Results  Component Value Date   WBC 10.8* 05/31/2012   HGB 12.2* 05/31/2012   HCT 35.6* 05/31/2012   MCV 83.8 05/31/2012   PLT 151 05/31/2012    BMET    Component Value Date/Time   NA 135 05/31/2012 0415   K 4.1 05/31/2012  0415   CL 102 05/31/2012 0415   CO2 24 05/31/2012 0415   GLUCOSE 129* 05/31/2012 0415   BUN 18 05/31/2012 0415   CREATININE 0.86 05/31/2012 0415   CALCIUM 8.4 05/31/2012 0415   GFRNONAA 82* 05/31/2012 0415   GFRAA >90 05/31/2012 0415   COAG Lab Results  Component Value Date   INR 1.04 05/27/2012   INR 1.8* 04/06/2009   INR 1.9* 04/05/2009     Disposition:  Discharge to :Home Discharge Orders    Future Appointments: Provider: Department: Dept Phone: Center:   06/08/2012 9:15 AM Peter M [place], MD [facility] Main [facility] [place]) [phone] [facility]   11/22/2012 8:30 AM Delcie Roch  [facility] [phone] None   11/22/2012 9:00 AM Samul Dada, MD [facility] [phone] None     Future Orders Please Complete By Expires   Resume previous diet      Driving Restrictions      Comments:   No driving for 2 weeks   Lifting restrictions      Comments:   No lifting for 6 weeks   Call MD for:  temperature >100.5      Call MD for:  redness, tenderness, or signs of infection (pain, swelling, bleeding, redness, odor or green/yellow discharge around incision site)      Call MD for:  severe or increased pain, loss or decreased feeling  in affected limb(s)  May shower       Scheduling Instructions:   May shower in 48 hours from surgery time   Discharge instructions      Comments:   May stop your Asprin in 7 days from now.   Resume previous diet      Driving Restrictions      Comments:   No driving for 2 weeks   Lifting restrictions      Comments:   No lifting for 6 weeks   Call MD for:  temperature >100.5      Call MD for:  redness, tenderness, or signs of infection (pain, swelling, bleeding, redness, odor or green/yellow discharge around incision site)      Call MD for:  severe or increased pain, loss or decreased feeling  in affected limb(s)      Increase activity slowly      Comments:   Walk  with assistance use walker or cane as needed   May shower       Scheduling Instructions:   May shower in am tomorrow with soap and water.   Discharge patient      Comments:   Discharge pt to home      Jonathan York, Jonathan York  Home Medication Instructions [id number]   Printed on:05/31/12 8119  Medication Information                    Multiple Vitamins-Minerals (CENTRUM) tablet Take 1 tablet by mouth daily.             clobetasol (TEMOVATE) 0.05 % ointment Apply 1 application topically 2 (two) times daily as needed. For rash           fish oil-omega-3 fatty acids 1000 MG capsule Take 1 g by mouth 3 (three) times daily.             cetirizine (ZYRTEC) 10 MG tablet Take 10 mg by mouth daily as needed. For allergies           psyllium (REGULOID) 0.52 G capsule Take 0.52 g by mouth daily.           Cinnamon 500 MG capsule Take 500 mg by mouth daily.           atorvastatin (LIPITOR) 80 MG tablet Take 80 mg by mouth at bedtime.           Tamsulosin HCl (FLOMAX) 0.4 MG CAPS Take 1 capsule (0.4 mg total) by mouth daily.           Rivaroxaban (XARELTO) 20 MG TABS Take 1 tablet (20 mg total) by mouth daily.           naproxen sodium (ANAPROX) 220 MG tablet Take 220 mg by mouth daily as needed. For pain           amLODipine (NORVASC) 5 MG tablet Take 7.5 mg by mouth at bedtime.           hydrochlorothiazide (MICROZIDE) 12.5 MG capsule Take 12.5 mg by mouth daily.           ramipril (ALTACE) 10 MG capsule Take 10 mg by mouth daily.           aspirin 325 MG EC tablet Take 325 mg by mouth daily.           oxyCODONE-acetaminophen (ROXICET) 5-325 MG per tablet Take 1 tablet by mouth every 4 (four) hours as needed for pain.            Verbal and  written Discharge instructions given to the patient.  F/U 2 weeks with Dr. Darrick Penna and he has a F/U with cardiology on 06-08-2012 for atrial fib.  SignedMosetta Pigeon 05/31/2012, 7:23 AM

## 2012-05-31 NOTE — Progress Notes (Addendum)
VASCULAR AND VEIN SURGERY POST - OP [facility] PROGRESS NOTE  Date of Surgery: 05/30/2012 Surgeon: Surgeon(s): Sherren Kerns, MD 1 Day Post-Op left Carotid Endarterectomy .  HPI: Jonathan York is a 78 y.o. male who is 1 Day Post-Op left Carotid Endarterectomy . Patient is doing well. Pre-operative symptoms are Improved Patient denies headache; Patient denies difficulty swallowing; denies weakness in upper or lower extremities; Pt. denies other symptoms of stroke or TIA.  IMAGING: No results found.  Significant Diagnostic Studies: CBC Lab Results  Component Value Date   WBC 10.8* 05/31/2012   HGB 12.2* 05/31/2012   HCT 35.6* 05/31/2012   MCV 83.8 05/31/2012   PLT 151 05/31/2012    BMET    Component Value Date/Time   NA 135 05/31/2012 0415   K 4.1 05/31/2012 0415   CL 102 05/31/2012 0415   CO2 24 05/31/2012 0415   GLUCOSE 129* 05/31/2012 0415   BUN 18 05/31/2012 0415   CREATININE 0.86 05/31/2012 0415   CALCIUM 8.4 05/31/2012 0415   GFRNONAA 82* 05/31/2012 0415   GFRAA >90 05/31/2012 0415    COAG Lab Results  Component Value Date   INR 1.04 05/27/2012   INR 1.8* 04/06/2009   INR 1.9* 04/05/2009   No results found for this basename: PTT      Intake/Output Summary (Last 24 hours) at 05/31/12 0720 Last data filed at 05/31/12 0600  Gross per 24 hour  Intake   3825 ml  Output   2105 ml  Net   1720 ml    Physical Exam:  BP Readings from Last 3 Encounters:  05/31/12 128/72  05/31/12 128/72  05/27/12 171/93   Temp Readings from Last 3 Encounters:  05/31/12 98.6 F (37 C) Oral  05/31/12 98.6 F (37 C) Oral  05/27/12 97.6 F (36.4 C) Oral   SpO2 Readings from Last 3 Encounters:  05/31/12 96%  05/31/12 96%  05/27/12 96%   Pulse Readings from Last 3 Encounters:  05/31/12 60  05/31/12 60  05/27/12 85    Pt is A&O x 3 Gait is normal Speech is fluent left Neck Wound is healing well Patient with Negative tongue deviation and Negative facial droop Pt  has good and equal strength in all extremities  Assessment: Jonathan York is a 78 y.o. male is S/P Left Carotid endarterectomy Pt is voiding, ambulating and taking po well   Plan: Discharge to: Home Follow-up in 2 weeks   Clinton Gallant [facility] [phone] 05/31/2012 7:20 AM   No hematoma Ambulating well Neuro intact Will d/c home this morning Will keep on ASA with his Pradaxa x 1 week then stop aspirin  Fabienne Bruns, MD [facility] [place] Office: [phone] Pager: [phone]

## 2012-05-31 NOTE — Progress Notes (Signed)
Discussed discharge instructions and medications with patient and wife. Both verbalized understanding with all questions answered. VSS. Surgical site is open to air with dermabond and WNL.Pt has no complaints of pain.Pt discharged home with wife.  [facility]

## 2012-05-31 NOTE — Progress Notes (Signed)
Utilization review completed- retro 

## 2012-05-31 NOTE — Telephone Encounter (Signed)
Message copied by Fredrich Birks on Tue May 31, 2012 10:53 AM ------      Message from: Melene Plan      Created: Tue May 31, 2012  9:21 AM                   ----- Message -----         From: Lars Mage, PA         Sent: 05/31/2012   7:30 AM           To: Melene Plan, RN            Left carotid endarterectomy F/U in 2 weeks with Dr. Darrick Penna.  Thanks

## 2012-05-31 NOTE — Telephone Encounter (Signed)
LVM @ home # and sent letter, dpm

## 2012-06-01 NOTE — Consult Note (Signed)
NAMEGREELY, ATIYEH NO.:  [id number]  MEDICAL RECORD NO.:  [id number]  LOCATION:  [facility]                         FACILITY:  [facility]  PHYSICIAN:  Dannisha Eckmann K. Pavielle Biggar, M.D.DATE OF BIRTH:  1933/04/27  DATE OF CONSULTATION:  05/27/2012 DATE OF DISCHARGE:  05/27/2012                                CONSULTATION   EXAMINATION:   Intracranial interpretation of bilateral common carotid arteriograms.  CLINICAL HISTORY:   Progressive left carotid artery stenosis extracranially.  FINDINGS:   The right common carotid arteriogram demonstrates the distal cervical portion of the right internal carotid artery to be normal.  There is approximately 30% stenosis of the proximal cavernous segment of the right internal carotid artery.  More distally the cavernous segment and supraclinoid segments opacify normally.  The right middle cerebral artery and the right anterior cerebral artery grossly demonstrate no abnormalities.  Opacification into the capillary and venous phase is normal.  Prompt cross opacification via the anterior communicating artery of the left anterior cerebral artery A2 segment distally seen.  The left common carotid arteriogram demonstrates the distal cervical left ICA to be normal.  The petrous segment opacifies normally.  The cavernous segment demonstrates approximately 20-30% stenosis in the proximal cavernous segment.  The distal supraclinoid segment opacifies normally.  The left middle cerebral artery opacification into the capillary and venous phase is normal.  No opacification seen of the left anterior cerebral artery A1 segment.  IMPRESSION: 1. Bilateral internal carotid artery, cavernous segment  Atherosclerotic disease with stenoses of approximately 30 %     bilaterally. 2. Angiographically no visualization of the left anterior cerebral     artery A1 segment.  This may represent congenital hypoplasia or     remote atherosclerotic  occlusion.          ______________________________ Grandville Silos Corliss Skains, M.D.     SKD/MEDQ  D:  05/31/2012  T:  06/01/2012  Job:  [id number]

## 2012-06-08 ENCOUNTER — Other Ambulatory Visit: Payer: Self-pay | Admitting: Cardiology

## 2012-06-08 ENCOUNTER — Ambulatory Visit (INDEPENDENT_AMBULATORY_CARE_PROVIDER_SITE_OTHER): Payer: Medicare Other | Admitting: Cardiology

## 2012-06-08 ENCOUNTER — Other Ambulatory Visit: Payer: Self-pay

## 2012-06-08 ENCOUNTER — Encounter: Payer: Self-pay | Admitting: Cardiology

## 2012-06-08 VITALS — BP 140/70 | HR 88 | Ht 75.0 in | Wt 238.0 lb

## 2012-06-08 DIAGNOSIS — I6529 Occlusion and stenosis of unspecified carotid artery: Secondary | ICD-10-CM

## 2012-06-08 DIAGNOSIS — I4891 Unspecified atrial fibrillation: Secondary | ICD-10-CM

## 2012-06-08 DIAGNOSIS — E119 Type 2 diabetes mellitus without complications: Secondary | ICD-10-CM

## 2012-06-08 DIAGNOSIS — I10 Essential (primary) hypertension: Secondary | ICD-10-CM

## 2012-06-08 NOTE — Patient Instructions (Signed)
Continue your current therapy  I will schedule you for an elective cardioversion in 3 weeks.

## 2012-06-08 NOTE — Progress Notes (Signed)
Jonathan York  Date of Birth: 05/22/1933  Medical Record #[id number]  History of Present Illness:  Jonathan York is seen for followup after recent left carotid endarterectomy. His postoperative course was uncomplicated. He remained in atrial fibrillation and has been started back on his Xarelto. Rate has been under good control. He still notes shortness of breath with exertion. He denies any chest pain, dizziness, or syncope.  Current Outpatient Prescriptions on File Prior to Visit   Medication  Sig  Dispense  Refill   .  amLODipine (NORVASC) 5 MG tablet  Take 7.5 mg by mouth at bedtime.     .  atorvastatin (LIPITOR) 80 MG tablet  Take 80 mg by mouth at bedtime.     .  cetirizine (ZYRTEC) 10 MG tablet  Take 10 mg by mouth daily as needed. For allergies     .  Cinnamon 500 MG capsule  Take 500 mg by mouth daily.     .  clobetasol (TEMOVATE) 0.05 % ointment  Apply 1 application topically 2 (two) times daily as needed. For rash     .  fish oil-omega-3 fatty acids 1000 MG capsule  Take 1 g by mouth 3 (three) times daily.     .  hydrochlorothiazide (MICROZIDE) 12.5 MG capsule  Take 12.5 mg by mouth daily.     .  Multiple Vitamins-Minerals (CENTRUM) tablet  Take 1 tablet by mouth daily.     .  oxyCODONE-acetaminophen (ROXICET) 5-325 MG per tablet  Take 1 tablet by mouth every 4 (four) hours as needed for pain.  30 tablet  0   .  psyllium (REGULOID) 0.52 G capsule  Take 0.52 g by mouth daily.     .  ramipril (ALTACE) 10 MG capsule  Take 10 mg by mouth daily.     .  Rivaroxaban (XARELTO) 20 MG TABS  Take 1 tablet (20 mg total) by mouth daily.  30 tablet  6   .  Tamsulosin HCl (FLOMAX) 0.4 MG CAPS  Take 1 capsule (0.4 mg total) by mouth daily.  30 capsule  6    No Known Allergies  Past Medical History   Diagnosis  Date   .  HLD (hyperlipidemia)    .  HTN (hypertension)    .  BPH (benign prostatic hypertrophy)    .  Cancer      lung cancer dx'd 2002   .  Abdominal bruit  05/05/2012   .  Atrial  fibrillation    .  Diabetes mellitus type II      diet controlled   .  Arthritis      back pain, past lumbar surgery   .  Carotid arterial disease     Past Surgical History   Procedure  Date   .  Hemorrhoid surgery  1970   .  Cataract extraction      right   .  Retinal detachment surgery      right   .  Wedge resection  03/15/01     [facility] ([facility])   .  Lobectomy  03/15/01     LUL ([facility])   .  Spine surgery  approx 2006     Ruptered Disc   .  Total knee arthroplasty      x2 per Dr. Blackmon   .  Joint replacement      knee- bilateral- 2009   .  Eye surgery      Jonathan detached retina- 1990's, IOL implants also   .    Tonsillectomy      1939   .  Endarterectomy  05/30/2012     Procedure: ENDARTERECTOMY CAROTID; Surgeon: Charles E Fields, MD; Location: [facility] OR; Service: Vascular; Laterality: Left;    History   Smoking status   .  Former Smoker -- 1.0 packs/day for 32 years   .  Types:  Cigarettes   .  Quit date:  09/03/1982   Smokeless tobacco   .  Never Used    History   Alcohol Use  No      Comment: quit 2003 w exception of wine in winter (a little)    Family History   Problem  Relation  Age of Onset   .  Emphysema  Father    .  Dementia  Mother    .  Diabetes  Mother    .  Hypertension  Mother    .  Hypothyroidism  Sister    .  Prostate cancer  Neg Hx    .  Colon cancer  Neg Hx    .  Dementia  Brother    .  Diabetes  Son     Review of Systems:  The review of systems is as noted in history of present illness. He's had no bleeding problems. All other systems were reviewed and are negative.  Physical Exam:  BP 140/70  Pulse 88  Ht 6' 3" (1.905 m)  Wt 238 lb (107.956 kg)  BMI 29.75 kg/m2  SpO2 98%  He is a pleasant white male in no acute distress.The patient is alert and oriented x 3. The HEENT exam reveals that the sclera are nonicteric. The mucous membranes are moist. The carotids are 2+ with a left carotid endarterectomy scar. There is no thyromegaly.  There is no JVD. The lungs are clear. The chest wall is non tender. The heart exam reveals an irregular rate with a normal S1 and S2. There are no murmurs, gallops, or rubs. The PMI is not displaced. Abdominal exam reveals good bowel sounds. There is no hepatosplenomegaly or tenderness. There is a loud abdominal bruit. There are no masses. Exam of the legs reveal no clubbing, cyanosis, or edema. The legs are without rashes. The distal pulses are intact. Cranial nerves II - XII are intact. Motor and sensory functions are intact. The gait is normal.  LABORATORY DATA:  Transthoracic Echocardiography  Patient: Jonathan York, Jonathan York MR #: [id number] Study Date: 05/13/2012 Gender: M Age: 78 Height: 193cm Weight: 108.9kg BSA: 2.39m^2 Pt. Status: Room:  ORDERING Jordan, Peter REFERRING Jordan, Peter PERFORMING [place], [facility] SONOGRAPHER Sherriann Reid, RDCS ATTENDING Bensimhon, Daniel cc:  ------------------------------------------------------------ LV EF: 55% - 65%  ------------------------------------------------------------ Indications: Atrial fibrillation - 427.31.  ------------------------------------------------------------ History: PMH: Left carotid bruit. Lung cancer, chemotherapy. Acquired from the patient and from the patient's chart. Atrial fibrillation. Risk factors: Former tobacco use. Hypertension. Diabetes mellitus. Dyslipidemia.  ------------------------------------------------------------ Study Conclusions  - Left ventricle: The cavity size was normal. Wall thickness was increased in a pattern of moderate LVH. Systolic function was normal. The estimated ejection fraction was in the range of 55% to 65%. Wall motion was normal; there were no regional wall motion abnormalities. - Aortic valve: Trivial regurgitation. - Mitral valve: Calcified annulus. Moderately thickened leaflets . - Left atrium: The atrium was moderately dilated. - Right atrium: The atrium was mildly  dilated. - Atrial septum: No defect or patent foramen ovale was identified. Transthoracic echocardiography. M-mode, complete 2D, spectral Doppler, and color Doppler. Height: Height: 193cm. Height: 76in.   Weight: Weight: 108.9kg. Weight: 239.5lb. Body mass index: BMI: 29.2kg/m^2. Body surface area: BSA: 2.39m^2. Blood pressure: 176/90. Patient status: Outpatient. Location: [place] [facility]  ------------------------------------------------------------  ------------------------------------------------------------ Left ventricle: The cavity size was normal. Wall thickness was increased in a pattern of moderate LVH. Systolic function was normal. The estimated ejection fraction was in the range of 55% to 65%. Wall motion was normal; there were no regional wall motion abnormalities. The study was not technically sufficient to allow evaluation of LV diastolic dysfunction due to atrial fibrillation.  ------------------------------------------------------------ Aortic valve: Trileaflet. Sclerosis without stenosis. Doppler: Trivial regurgitation.  ------------------------------------------------------------ Mitral valve: Calcified annulus. Moderately thickened leaflets . Leaflet separation was normal. Doppler: Transvalvular velocity was within the normal range. There was no evidence for stenosis. Trivial regurgitation. Peak gradient: 3mm Hg (D).  ------------------------------------------------------------ Left atrium: The atrium was moderately dilated.  ------------------------------------------------------------ Atrial septum: No defect or patent foramen ovale was identified.  ------------------------------------------------------------ Right ventricle: The cavity size was normal. Wall thickness was normal. Systolic function was normal.  ------------------------------------------------------------ Pulmonic valve: Structurally normal valve. Cusp separation was normal. Doppler:  Transvalvular velocity was within the normal range. Trivial regurgitation.  ------------------------------------------------------------ Tricuspid valve: Structurally normal valve. Leaflet separation was normal. Doppler: Transvalvular velocity was within the normal range. Mild regurgitation.  ------------------------------------------------------------ Pulmonary artery: The main pulmonary artery was normal-sized.  ------------------------------------------------------------ Right atrium: The atrium was mildly dilated.  ------------------------------------------------------------ Pericardium: The pericardium was normal in appearance.  ------------------------------------------------------------ Systemic veins: Inferior vena cava: The vessel was normal in size; the respirophasic diameter changes were in the normal range (= 50%); findings are consistent with normal central venous pressure.  ------------------------------------------------------------  2D measurements Normal Doppler measurements Normal Left ventricle Main pulmonary LVID ED, 32 mm 43-52 artery chord, Pressure, S 26 mm Hg =30 PLAX LVOT LVID ES, 23.4 mm 23-38 Peak vel, S 84 cm/s ------ chord, VTI, S 18. cm ------ PLAX 1 FS, chord, 27 % >29 Stroke vol 56. ml ------ PLAX 9 LVPW, ED 16.1 mm ------ Stroke 23. ml/m^2 ------ IVS/LVPW 0.98 <1.3 index 8 ratio, ED Aortic valve Ventricular septum Regurg PHT 534 ms ------ IVS, ED 15.7 mm ------ Mitral valve LVOT Peak E vel 81 cm/s ------ Diam, S 20 mm ------ Deceleratio 183 ms 150-23 Area 3.14 cm^2 ------ n time 0 Diam 20 mm ------ Peak 3 mm Hg ------ Aorta gradient, D Root diam, 33 mm ------ Tricuspid valve ED Regurg peak 230 cm/s ------ Left atrium vel AP dim 50 mm ------ Peak RV-RA 21 mm Hg ------ AP dim 2.09 cm/m^2 <2.2 gradient, S index Systemic veins Estimated 5 mm Hg ------ CVP Right ventricle Pressure, S 26 mm Hg <30 Pulmonic valve Peak vel, S 76 cm/s  ------  ------------------------------------------------------------ Prepared and Electronically Authenticated by  Nishan, Peter 2013-10-25T16:03:[id number]  Cardiology Nuclear Med Study  Jonathan York is a 78 y.o. male MRN : [id number] DOB: 10/23/1932  Procedure Date: 05/11/2012  Nuclear Med Background  Indication for Stress Test: Evaluation for Ischemia and Pending Surgical Clearance for Possible (L) CEA  History: New onset atrial fibrillation; '02 Chemo; '09 Echo:EF=60%  Cardiac Risk Factors: Carotid Disease, History of Smoking, Hypertension, Lipids and NIDDM  Symptoms: Dizziness While on Treadmill and DOE  Nuclear Pre-Procedure  Caffeine/Decaff Intake: None  NPO After: 8:00am   Lungs: Clear.  O2 Sat: 97% on room air.  IV 0.9% NS with Angio Cath: 20g   IV Site: Jonathan Antecubital  IV Started by: Tara Kaheny, EMT-P   Chest Size (in): 44  Cup   Size: n/a   Height: 6' 3" (1.905 m)  Weight: 238 lb (107.956 kg)   BMI: Body mass index is 29.75 kg/(m^2).  Tech Comments: NA   Nuclear Med Study  1 or 2 day study: 1 day  Stress Test Type: Lexiscan   Reading MD: Peter Nishan, MD  Order Authorizing Provider: Peter Jordan, MD   Resting Radionuclide: Technetium 99m Sestamibi  Resting Radionuclide Dose: 10.9 mCi   Stress Radionuclide: Technetium 99m Sestamibi  Stress Radionuclide Dose: 32.9 mCi   Stress Protocol  Rest HR: 61  Stress HR: 102   Rest BP: 148/87  Stress BP: 156/83   Exercise Time (min): n/a  METS: n/a   Predicted Max HR: 144 bpm  % Max HR: 70.83 bpm  Rate Pressure Product: 15912  Dose of Adenosine (mg): n/a  Dose of Lexiscan: 0.4 mg   Dose of Atropine (mg): n/a  Dose of Dobutamine: n/a mcg/kg/min (at max HR)   Stress Test Technologist: Sherri Ballard, CMA-N  Nuclear Technologist: Stephen Carbone, CNMT   Rest Procedure: Myocardial perfusion imaging was performed at rest 45 minutes following the intravenous administration of Technetium 99m Sestamibi.  Rest ECG: Atrial fibrillation with  possible prior ASWMI.  Stress Procedure: The patient received IV Lexiscan 0.4 mg over 15-seconds. Technetium 99m Sestamibi was injected at 30-seconds. There were no significant changes with Lexiscan, rare PVC was noted. He did c/o chest tightness, 5/10, with Lexiscan. Quantitative spect images were obtained after a 45 minute delay.  Stress ECG: No significant change from baseline ECG  QPS  Raw Data Images: Normal; no motion artifact; normal heart/lung ratio.  Stress Images: Normal homogeneous uptake in all areas of the myocardium.  Rest Images: Normal homogeneous uptake in all areas of the myocardium.  Subtraction (SDS): Normal  Transient Ischemic Dilatation (Normal <1.22): 1.06  Lung/Heart Ratio (Normal <0.45): 0.31  Quantitative Gated Spect Images  QGS EDV: 95 ml  QGS ESV: 36 ml  Impression  Exercise Capacity: Lexiscan with no exercise.  BP Response: Normal blood pressure response.  Clinical Symptoms: There is dyspnea.  ECG Impression: No significant ST segment change suggestive of ischemia.  Comparison with Prior Nuclear Study: No previous nuclear study performed  Overall Impression: Normal stress nuclear study.  LV Ejection Fraction: 62%. LV Wall Motion: NL LV Function; NL Wall Motion  Peter Nishan  Assessment / Plan:  1. Atrial fibrillation. Rate is well controlled on no rate slowing medication. Patient is symptomatic with dyspnea. We will plan on proceeding with an elective cardioversion in 3 weeks since he is symptomatic.  2. Left carotid stenosis. Status post left carotid endarterectomy.  3. Abdominal bruit. Abdominal ultrasound showed no evidence of aneurysm.  4. Hypertension. Controlled.  5. Hyperlipidemia.  6. Diabetes mellitus type 2.  7. History of lung CA status post resection and chemotherapy in 2002.     

## 2012-06-22 ENCOUNTER — Other Ambulatory Visit (INDEPENDENT_AMBULATORY_CARE_PROVIDER_SITE_OTHER): Payer: Medicare Other

## 2012-06-22 ENCOUNTER — Encounter: Payer: Self-pay | Admitting: Vascular Surgery

## 2012-06-22 DIAGNOSIS — I4891 Unspecified atrial fibrillation: Secondary | ICD-10-CM

## 2012-06-22 LAB — PROTIME-INR: Prothrombin Time: 14.1 s — ABNORMAL HIGH (ref 10.2–12.4)

## 2012-06-22 LAB — CBC WITH DIFFERENTIAL/PLATELET
Basophils Absolute: 0.1 10*3/uL (ref 0.0–0.1)
HCT: 43.8 % (ref 39.0–52.0)
Lymphs Abs: 2.5 10*3/uL (ref 0.7–4.0)
MCV: 87.7 fl (ref 78.0–100.0)
Monocytes Absolute: 0.8 10*3/uL (ref 0.1–1.0)
Platelets: 226 10*3/uL (ref 150.0–400.0)
RDW: 14.4 % (ref 11.5–14.6)

## 2012-06-22 LAB — BASIC METABOLIC PANEL
BUN: 17 mg/dL (ref 6–23)
GFR: 77 mL/min (ref >60.00)
Glucose, Bld: 125 mg/dL — ABNORMAL HIGH (ref 70–99)
Potassium: 4.2 mEq/L (ref 3.5–5.1)

## 2012-06-23 ENCOUNTER — Ambulatory Visit (INDEPENDENT_AMBULATORY_CARE_PROVIDER_SITE_OTHER): Payer: Medicare Other | Admitting: Vascular Surgery

## 2012-06-23 ENCOUNTER — Encounter: Payer: Self-pay | Admitting: Vascular Surgery

## 2012-06-23 VITALS — BP 166/85 | HR 98 | Resp 22 | Ht 75.5 in | Wt 242.0 lb

## 2012-06-23 DIAGNOSIS — I6529 Occlusion and stenosis of unspecified carotid artery: Secondary | ICD-10-CM

## 2012-06-23 DIAGNOSIS — Z48812 Encounter for surgical aftercare following surgery on the circulatory system: Secondary | ICD-10-CM

## 2012-06-23 NOTE — Progress Notes (Signed)
[name] HISTORY AND PHYSICAL    History of Present Illness:  Patient is a 78 y.o. year old male who presents for post-operative follow-up after left carotid endarterectomy.  Denies headaches, numbness, tingling or other neuro deficits except for a patch of numbness under his left chin. No swallowing problems.  No incisional drainage.  He did have some swelling on the left neck postoperatively but this is now resolved. He is scheduled to have a cardioversion by Dr. [place] next week. He denies any symptoms of TIA amaurosis or stroke. He does have a known 60-80% stenosis on the contralateral side. This is asymptomatic.   Physical Examination  Filed Vitals:   06/23/12 1358  BP: 166/85  Pulse: 98  Resp: 22    Body mass index is 29.85 kg/(m^2).  General:  Alert and oriented, no acute distress Neck: No bruit or JVD, well-healed left neck incision Skin: No rash Extremities: 2+ radial pulses bilaterally Neurologic: Upper and lower extremity motor 5/5 and symmetric, tongue midline    ASSESSMENT: Doing well post left carotid endarterectomy. The patient is currently on Xarelto for anticoagulation for atrial fibrillation.   PLAN:  Followup carotid duplex scan and office visit in 6 months.If he comes off of Xarelto he will need to be on antiplatelet agent.   Fabienne Bruns, MD [facility] Office: [phone] Pager: [phone]

## 2012-06-29 ENCOUNTER — Encounter (HOSPITAL_COMMUNITY): Payer: Self-pay | Admitting: Anesthesiology

## 2012-06-29 ENCOUNTER — Ambulatory Visit (HOSPITAL_COMMUNITY): Payer: Medicare Other | Admitting: Anesthesiology

## 2012-06-29 ENCOUNTER — Encounter (HOSPITAL_COMMUNITY)
Admission: RE | Disposition: A | Discharge status: Home / Self Care | Payer: Self-pay | Source: Ambulatory Visit | Attending: Cardiovascular Disease

## 2012-06-29 ENCOUNTER — Encounter (HOSPITAL_COMMUNITY): Payer: Self-pay | Admitting: *Deleted

## 2012-06-29 ENCOUNTER — Ambulatory Visit (HOSPITAL_COMMUNITY)
Admission: RE | Admit: 2012-06-29 | Discharge: 2012-06-29 | Disposition: A | Discharge status: Home / Self Care | Payer: Medicare Other | Source: Ambulatory Visit | Attending: Cardiovascular Disease | Admitting: Cardiovascular Disease

## 2012-06-29 DIAGNOSIS — I4891 Unspecified atrial fibrillation: Secondary | ICD-10-CM | POA: Insufficient documentation

## 2012-06-29 DIAGNOSIS — Z7901 Long term (current) use of anticoagulants: Secondary | ICD-10-CM | POA: Insufficient documentation

## 2012-06-29 DIAGNOSIS — J449 Chronic obstructive pulmonary disease, unspecified: Secondary | ICD-10-CM | POA: Insufficient documentation

## 2012-06-29 DIAGNOSIS — Z79899 Other long term (current) drug therapy: Secondary | ICD-10-CM | POA: Insufficient documentation

## 2012-06-29 DIAGNOSIS — E119 Type 2 diabetes mellitus without complications: Secondary | ICD-10-CM | POA: Insufficient documentation

## 2012-06-29 DIAGNOSIS — J4489 Other specified chronic obstructive pulmonary disease: Secondary | ICD-10-CM | POA: Insufficient documentation

## 2012-06-29 DIAGNOSIS — I10 Essential (primary) hypertension: Secondary | ICD-10-CM | POA: Insufficient documentation

## 2012-06-29 HISTORY — PX: CARDIOVERSION: SHX1299

## 2012-06-29 SURGERY — CARDIOVERSION
Anesthesia: Monitor Anesthesia Care | Wound class: Clean

## 2012-06-29 MED ORDER — LACTATED RINGERS IV SOLN
INTRAVENOUS | Status: DC | PRN
  Administered 2012-06-29: 13:00:00 via INTRAVENOUS

## 2012-06-29 MED ORDER — PROPOFOL 10 MG/ML IV BOLUS
INTRAVENOUS | Status: DC | PRN
  Administered 2012-06-29: 100 mg via INTRAVENOUS

## 2012-06-29 MED ORDER — SODIUM CHLORIDE 0.9 % IV SOLN
INTRAVENOUS | Status: DC
  Administered 2012-06-29: 500 mL via INTRAVENOUS

## 2012-06-29 NOTE — Transfer of Care (Signed)
Immediate Anesthesia Transfer of Care Note  Patient: Jonathan York  Procedure(s) Performed: Procedure(s) (LRB) with comments: CARDIOVERSION (N/A)  Patient Location: PACU  Anesthesia Type:General  Level of Consciousness: awake, alert  and oriented  Airway & Oxygen Therapy: Patient Spontanous Breathing and Patient connected to nasal cannula oxygen  Post-op Assessment: Report given to PACU RN, Post -op Vital signs reviewed and stable and Patient moving all extremities X 4  Post vital signs: Reviewed and stable  Complications: No apparent anesthesia complications

## 2012-06-29 NOTE — CV Procedure (Signed)
    Cardioversion Note  Raye Slyter [id number] 09/13/1932  Procedure: DC Cardioversion Indications: atrial fibrillation  Procedure Details Consent: Obtained Time Out: Verified patient identification, verified procedure, site/side was marked, verified correct patient position, special equipment/implants available, Radiology Safety Procedures followed,  medications/allergies/relevent history reviewed, required imaging and test results available.  Performed  The patient has been on adequate anticoagulation.  The patient received IV Propofol 100 mg IV  for sedation.  Synchronous cardioversion was performed at 120 joules.  The cardioversion was successful.    Complications: No apparent complications Patient did tolerate procedure well.   Vesta Mixer, Montez Hageman., MD, [facility] 06/29/2012, 1:15 PM

## 2012-06-29 NOTE — H&P (Signed)
Jonathan York  Date of Birth: 09-20-1932  Medical Record #[id number]  History of Present Illness:  Jonathan York is seen for followup after recent left carotid endarterectomy. His postoperative course was uncomplicated. He remained in atrial fibrillation and has been started back on his Xarelto. Rate has been under good control. He still notes shortness of breath with exertion. He denies any chest pain, dizziness, or syncope.  Current Outpatient Prescriptions on File Prior to Visit   Medication  Sig  Dispense  Refill   .  amLODipine (NORVASC) 5 MG tablet  Take 7.5 mg by mouth at bedtime.     Marland Kitchen  atorvastatin (LIPITOR) 80 MG tablet  Take 80 mg by mouth at bedtime.     .  cetirizine (ZYRTEC) 10 MG tablet  Take 10 mg by mouth daily as needed. For allergies     .  Cinnamon 500 MG capsule  Take 500 mg by mouth daily.     .  clobetasol (TEMOVATE) 0.05 % ointment  Apply 1 application topically 2 (two) times daily as needed. For rash     .  fish oil-omega-3 fatty acids 1000 MG capsule  Take 1 g by mouth 3 (three) times daily.     .  hydrochlorothiazide (MICROZIDE) 12.5 MG capsule  Take 12.5 mg by mouth daily.     .  Multiple Vitamins-Minerals (CENTRUM) tablet  Take 1 tablet by mouth daily.     Marland Kitchen  oxyCODONE-acetaminophen (ROXICET) 5-325 MG per tablet  Take 1 tablet by mouth every 4 (four) hours as needed for pain.  30 tablet  0   .  psyllium (REGULOID) 0.52 G capsule  Take 0.52 g by mouth daily.     .  ramipril (ALTACE) 10 MG capsule  Take 10 mg by mouth daily.     .  Rivaroxaban (XARELTO) 20 MG TABS  Take 1 tablet (20 mg total) by mouth daily.  30 tablet  6   .  Tamsulosin HCl (FLOMAX) 0.4 MG CAPS  Take 1 capsule (0.4 mg total) by mouth daily.  30 capsule  6    No Known Allergies  Past Medical History   Diagnosis  Date   .  HLD (hyperlipidemia)    .  HTN (hypertension)    .  BPH (benign prostatic hypertrophy)    .  Cancer      lung cancer dx'd 2002   .  Abdominal bruit  05/05/2012   .  Atrial  fibrillation    .  Diabetes mellitus type II      diet controlled   .  Arthritis      back pain, past lumbar surgery   .  Carotid arterial disease     Past Surgical History   Procedure  Date   .  Hemorrhoid surgery  1970   .  Cataract extraction      right   .  Retinal detachment surgery      right   .  Wedge resection  03/15/01     [facility] Fetch)   .  Lobectomy  03/15/01     [facility] Dor[facility] Fetch)   .  Spine surgery  approx 2006     Ruptered Disc   .  Total knee arthroplasty      x2 per Dr. Rayburn Ma   .  Joint replacement      knee- bilateral- 2009   .  Eye surgery      Jonathan detached retina- 1990's, IOL implants also   .  Tonsillectomy      1939   .  Endarterectomy  05/30/2012     Procedure: ENDARTERECTOMY CAROTID; Surgeon: Sherren Kerns, MD; Location: [facility] OR; Service: Vascular; Laterality: Left;    History   Smoking status   .  Former Smoker -- 1.0 packs/day for 32 years   .  Types:  Cigarettes   .  Quit date:  09/03/1982   Smokeless tobacco   .  Never Used    History   Alcohol Use  No      Comment: quit 2003 w exception of wine in winter (a little)    Family History   Problem  Relation  Age of Onset   .  Emphysema  Father    .  Dementia  Mother    .  Diabetes  Mother    .  Hypertension  Mother    .  Hypothyroidism  Sister    .  Prostate cancer  Neg Hx    .  Colon cancer  Neg Hx    .  Dementia  Brother    .  Diabetes  Son     Review of Systems:  The review of systems is as noted in history of present illness. He's had no bleeding problems. All other systems were reviewed and are negative.  Physical Exam:  BP 140/70  Pulse 88  Ht 6\' 3"  (1.905 m)  Wt 238 lb (107.956 kg)  BMI 29.75 kg/m2  SpO2 98%  He is a pleasant white male in no acute distress.The patient is alert and oriented x 3. The HEENT exam reveals that the sclera are nonicteric. The mucous membranes are moist. The carotids are 2+ with a left carotid endarterectomy scar. There is no thyromegaly.  There is no JVD. The lungs are clear. The chest wall is non tender. The heart exam reveals an irregular rate with a normal S1 and S2. There are no murmurs, gallops, or rubs. The PMI is not displaced. Abdominal exam reveals good bowel sounds. There is no hepatosplenomegaly or tenderness. There is a loud abdominal bruit. There are no masses. Exam of the legs reveal no clubbing, cyanosis, or edema. The legs are without rashes. The distal pulses are intact. Cranial nerves II - XII are intact. Motor and sensory functions are intact. The gait is normal.  LABORATORY DATA:  Transthoracic Echocardiography  Patient: Jonathan York, Jonathan York MR #: [id number] Study Date: 05/13/2012 Gender: M Age: 37 Height: 193cm Weight: 108.9kg BSA: 2.93m^2 Pt. Status: Room:  ORDERING [place], Peter REFERRING [place], Peter PERFORMING [facility], [facility] SONOGRAPHER Philomena Course, RDCS ATTENDING Bensimhon, Daniel cc:  ------------------------------------------------------------ LV EF: 55% - 65%  ------------------------------------------------------------ Indications: Atrial fibrillation - 427.31.  ------------------------------------------------------------ History: PMH: Left carotid bruit. Lung cancer, chemotherapy. Acquired from the patient and from the patient's chart. Atrial fibrillation. Risk factors: Former tobacco use. Hypertension. Diabetes mellitus. Dyslipidemia.  ------------------------------------------------------------ Study Conclusions  - Left ventricle: The cavity size was normal. Wall thickness was increased in a pattern of moderate LVH. Systolic function was normal. The estimated ejection fraction was in the range of 55% to 65%. Wall motion was normal; there were no regional wall motion abnormalities. - Aortic valve: Trivial regurgitation. - Mitral valve: Calcified annulus. Moderately thickened leaflets . - Left atrium: The atrium was moderately dilated. - Right atrium: The atrium was mildly  dilated. - Atrial septum: No defect or patent foramen ovale was identified. Transthoracic echocardiography. M-mode, complete 2D, spectral Doppler, and color Doppler. Height: Height: 193cm. Height: 76in.  Weight: Weight: 108.9kg. Weight: 239.5lb. Body mass index: BMI: 29.2kg/m^2. Body surface area: BSA: 2.103m^2. Blood pressure: 176/90. Patient status: Outpatient. Location: [facility]  ------------------------------------------------------------  ------------------------------------------------------------ Left ventricle: The cavity size was normal. Wall thickness was increased in a pattern of moderate LVH. Systolic function was normal. The estimated ejection fraction was in the range of 55% to 65%. Wall motion was normal; there were no regional wall motion abnormalities. The study was not technically sufficient to allow evaluation of LV diastolic dysfunction due to atrial fibrillation.  ------------------------------------------------------------ Aortic valve: Trileaflet. Sclerosis without stenosis. Doppler: Trivial regurgitation.  ------------------------------------------------------------ Mitral valve: Calcified annulus. Moderately thickened leaflets . Leaflet separation was normal. Doppler: Transvalvular velocity was within the normal range. There was no evidence for stenosis. Trivial regurgitation. Peak gradient: 3mm Hg (D).  ------------------------------------------------------------ Left atrium: The atrium was moderately dilated.  ------------------------------------------------------------ Atrial septum: No defect or patent foramen ovale was identified.  ------------------------------------------------------------ Right ventricle: The cavity size was normal. Wall thickness was normal. Systolic function was normal.  ------------------------------------------------------------ Pulmonic valve: Structurally normal valve. Cusp separation was normal. Doppler:  Transvalvular velocity was within the normal range. Trivial regurgitation.  ------------------------------------------------------------ Tricuspid valve: Structurally normal valve. Leaflet separation was normal. Doppler: Transvalvular velocity was within the normal range. Mild regurgitation.  ------------------------------------------------------------ Pulmonary artery: The main pulmonary artery was normal-sized.  ------------------------------------------------------------ Right atrium: The atrium was mildly dilated.  ------------------------------------------------------------ Pericardium: The pericardium was normal in appearance.  ------------------------------------------------------------ Systemic veins: Inferior vena cava: The vessel was normal in size; the respirophasic diameter changes were in the normal range (= 50%); findings are consistent with normal central venous pressure.  ------------------------------------------------------------  2D measurements Normal Doppler measurements Normal Left ventricle Main pulmonary LVID ED, 32 mm 43-52 artery chord, Pressure, S 26 mm Hg =30 PLAX LVOT LVID ES, 23.4 mm 23-38 Peak vel, S 84 cm/s ------ chord, VTI, S 18. cm ------ PLAX 1 FS, chord, 27 % >29 Stroke vol 56. ml ------ PLAX 9 LVPW, ED 16.1 mm ------ Stroke 23. ml/m^2 ------ IVS/LVPW 0.98 <1.3 index 8 ratio, ED Aortic valve Ventricular septum Regurg PHT 534 ms ------ IVS, ED 15.7 mm ------ Mitral valve LVOT Peak E vel 81 cm/s ------ Diam, S 20 mm ------ Deceleratio 183 ms 150-23 Area 3.14 cm^2 ------ n time 0 Diam 20 mm ------ Peak 3 mm Hg ------ Aorta gradient, D Root diam, 33 mm ------ Tricuspid valve ED Regurg peak 230 cm/s ------ Left atrium vel AP dim 50 mm ------ Peak RV-RA 21 mm Hg ------ AP dim 2.09 cm/m^2 <2.2 gradient, S index Systemic veins Estimated 5 mm Hg ------ CVP Right ventricle Pressure, S 26 mm Hg <30 Pulmonic valve Peak vel, S 76 cm/s  ------  ------------------------------------------------------------ Prepared and Electronically Authenticated by  Charlton Haws 2013-10-25T16:03:[id number]  Cardiology Nuclear Med Study  Jonathan Jonathan York is a 78 y.o. male MRN : [id number] DOB: 07/11/1933  Procedure Date: 05/11/2012  Nuclear Med Background  Indication for Stress Test: Evaluation for Ischemia and Pending Surgical Clearance for Possible (L) CEA  History: New onset atrial fibrillation; '02 Chemo; '09 Echo:EF=60%  Cardiac Risk Factors: Carotid Disease, History of Smoking, Hypertension, Lipids and NIDDM  Symptoms: Dizziness While on Treadmill and DOE  Nuclear Pre-Procedure  Caffeine/Decaff Intake: None  NPO After: 8:00am   Lungs: Clear.  O2 Sat: 97% on room air.  IV 0.9% NS with Angio Cath: 20g   IV Site: Jonathan Antecubital  IV Started by: Stanton Kidney, EMT-P   Chest Size (in): 44  Cup  Size: n/a   Height: 6\' 3"  (1.905 m)  Weight: 238 lb (107.956 kg)   BMI: Body mass index is 29.75 kg/(m^2).  Tech Comments: NA   Nuclear Med Study  1 or 2 day study: 1 day  Stress Test Type: Lexiscan   Reading MD: Charlton Haws, MD  Order Authorizing Provider: Peter [place], MD   Resting Radionuclide: Technetium 30m Sestamibi  Resting Radionuclide Dose: 10.9 mCi   Stress Radionuclide: Technetium 33m Sestamibi  Stress Radionuclide Dose: 32.9 mCi   Stress Protocol  Rest HR: 61  Stress HR: 102   Rest BP: 148/87  Stress BP: 156/83   Exercise Time (min): n/a  METS: n/a   Predicted Max HR: 144 bpm  % Max HR: 70.83 bpm  Rate Pressure Product: 16109  Dose of Adenosine (mg): n/a  Dose of Lexiscan: 0.4 mg   Dose of Atropine (mg): n/a  Dose of Dobutamine: n/a mcg/kg/min (at max HR)   Stress Test Technologist: Smiley Houseman, CMA-N  Nuclear Technologist: Domenic Polite, CNMT   Rest Procedure: Myocardial perfusion imaging was performed at rest 45 minutes following the intravenous administration of Technetium 62m Sestamibi.  Rest ECG: Atrial fibrillation with  possible prior ASWMI.  Stress Procedure: The patient received IV Lexiscan 0.4 mg over 15-seconds. Technetium 63m Sestamibi was injected at 30-seconds. There were no significant changes with Lexiscan, rare PVC was noted. He did c/o chest tightness, 5/10, with Lexiscan. Quantitative spect images were obtained after a 45 minute delay.  Stress ECG: No significant change from baseline ECG  QPS  Raw Data Images: Normal; no motion artifact; normal heart/lung ratio.  Stress Images: Normal homogeneous uptake in all areas of the myocardium.  Rest Images: Normal homogeneous uptake in all areas of the myocardium.  Subtraction (SDS): Normal  Transient Ischemic Dilatation (Normal <1.22): 1.06  Lung/Heart Ratio (Normal <0.45): 0.31  Quantitative Gated Spect Images  QGS EDV: 95 ml  QGS ESV: 36 ml  Impression  Exercise Capacity: Lexiscan with no exercise.  BP Response: Normal blood pressure response.  Clinical Symptoms: There is dyspnea.  ECG Impression: No significant ST segment change suggestive of ischemia.  Comparison with Prior Nuclear Study: No previous nuclear study performed  Overall Impression: Normal stress nuclear study.  LV Ejection Fraction: 62%. LV Wall Motion: NL LV Function; NL Wall Motion  Charlton Haws  Assessment / Plan:  1. Atrial fibrillation. Rate is well controlled on no rate slowing medication. Patient is symptomatic with dyspnea. We will plan on proceeding with an elective cardioversion in 3 weeks since he is symptomatic.  2. Left carotid stenosis. Status post left carotid endarterectomy.  3. Abdominal bruit. Abdominal ultrasound showed no evidence of aneurysm.  4. Hypertension. Controlled.  5. Hyperlipidemia.  6. Diabetes mellitus type 2.  7. History of lung CA status post resection and chemotherapy in 2002.

## 2012-06-29 NOTE — Anesthesia Preprocedure Evaluation (Addendum)
Anesthesia Evaluation  Patient identified by MRN, date of birth, ID band Patient awake    Reviewed: Allergy & Precautions, H&P , Patient's Chart, lab work & pertinent test results  Airway Mallampati: II TM Distance: <3 FB     Dental  (+) Teeth Intact and Dental Advidsory Given   Pulmonary COPD breath sounds clear to auscultation        Cardiovascular hypertension, On Medications + Peripheral Vascular Disease + dysrhythmias Atrial Fibrillation Rhythm:Irregular Rate:Tachycardia     Neuro/Psych PSYCHIATRIC DISORDERS    GI/Hepatic   Endo/Other    Renal/GU      Musculoskeletal   Abdominal   Peds  Hematology   Anesthesia Other Findings   Reproductive/Obstetrics                          Anesthesia Physical Anesthesia Plan  ASA: III  Anesthesia Plan: General   Post-op Pain Management:    Induction: Intravenous  Airway Management Planned:   Additional Equipment:   Intra-op Plan:   Post-operative Plan:   Informed Consent:   Dental Advisory Given  Plan Discussed with: Anesthesiologist, CRNA and Surgeon  Anesthesia Plan Comments:        Anesthesia Quick Evaluation

## 2012-06-29 NOTE — Preoperative (Signed)
Beta Blockers   Reason not to administer Beta Blockers:Not Applicable 

## 2012-06-29 NOTE — Anesthesia Postprocedure Evaluation (Signed)
  Anesthesia Post-op Note  Patient: Jonathan York  Procedure(s) Performed: Procedure(s) (LRB) with comments: CARDIOVERSION (N/A)  Patient Location: PACU  Anesthesia Type:General  Level of Consciousness: awake, alert  and oriented  Airway and Oxygen Therapy: Patient Spontanous Breathing  Post-op Pain: none  Post-op Assessment: Post-op Vital signs reviewed, Patient's Cardiovascular Status Stable, Respiratory Function Stable and Patent Airway  Post-op Vital Signs: stable  Complications: No apparent anesthesia complications

## 2012-06-29 NOTE — Interval H&P Note (Signed)
History and Physical Interval Note:  06/29/2012 1:13 PM  [name]  has presented today for surgery, with the diagnosis of Atrial Fib  The various methods of treatment have been discussed with the patient and family. After consideration of risks, benefits and other options for treatment, the patient has consented to  Procedure(s) (LRB) with comments: CARDIOVERSION (N/A) as a surgical intervention .  The patient's history has been reviewed, patient examined, no change in status, stable for surgery.  I have reviewed the patient's chart and labs.  Questions were answered to the patient's satisfaction.     [name].

## 2012-06-30 ENCOUNTER — Encounter (HOSPITAL_COMMUNITY): Payer: Self-pay | Admitting: Cardiovascular Disease

## 2012-07-01 ENCOUNTER — Other Ambulatory Visit: Payer: Self-pay | Admitting: *Deleted

## 2012-07-01 MED ORDER — ATORVASTATIN CALCIUM 80 MG PO TABS: 80.0000 mg | ORAL_TABLET | Freq: Every day | ORAL | Status: DC

## 2012-08-02 ENCOUNTER — Telehealth: Payer: Self-pay | Admitting: *Deleted

## 2012-08-02 NOTE — Telephone Encounter (Signed)
Patient called to tell me he is having dizziness with Xarelto drug. Patient stated he notes that he has dizziness while in the gym. I asked him about blood pressure and he states it was 126/66. I asked him if he drinks water while in gym. He stated no but has coffee. I asked patient to stay well hydrated with water especially while exercising. I asked patient if dizziness does not improve with hydration to make appointment with Dr. [place]. Patient verbalized understanding.

## 2012-08-08 ENCOUNTER — Other Ambulatory Visit: Payer: Self-pay | Admitting: *Deleted

## 2012-08-08 DIAGNOSIS — Z48812 Encounter for surgical aftercare following surgery on the circulatory system: Secondary | ICD-10-CM

## 2012-08-08 DIAGNOSIS — I6529 Occlusion and stenosis of unspecified carotid artery: Secondary | ICD-10-CM

## 2012-08-10 ENCOUNTER — Encounter (INDEPENDENT_AMBULATORY_CARE_PROVIDER_SITE_OTHER): Payer: Medicare Other | Admitting: Ophthalmology

## 2012-08-10 DIAGNOSIS — H43819 Vitreous degeneration, unspecified eye: Secondary | ICD-10-CM

## 2012-08-10 DIAGNOSIS — H35039 Hypertensive retinopathy, unspecified eye: Secondary | ICD-10-CM

## 2012-08-10 DIAGNOSIS — I10 Essential (primary) hypertension: Secondary | ICD-10-CM

## 2012-08-10 DIAGNOSIS — H35349 Macular cyst, hole, or pseudohole, unspecified eye: Secondary | ICD-10-CM

## 2012-08-10 DIAGNOSIS — H33009 Unspecified retinal detachment with retinal break, unspecified eye: Secondary | ICD-10-CM

## 2012-08-15 ENCOUNTER — Ambulatory Visit (INDEPENDENT_AMBULATORY_CARE_PROVIDER_SITE_OTHER): Payer: Medicare Other | Admitting: Family Medicine

## 2012-08-15 ENCOUNTER — Encounter (INDEPENDENT_AMBULATORY_CARE_PROVIDER_SITE_OTHER): Payer: Medicare Other | Admitting: Ophthalmology

## 2012-08-15 ENCOUNTER — Telehealth: Payer: Self-pay

## 2012-08-15 ENCOUNTER — Telehealth: Payer: Self-pay | Admitting: Cardiology

## 2012-08-15 ENCOUNTER — Encounter: Payer: Self-pay | Admitting: Family Medicine

## 2012-08-15 VITALS — BP 166/84 | HR 89 | Temp 98.1°F | Wt 245.0 lb

## 2012-08-15 DIAGNOSIS — R42 Dizziness and giddiness: Secondary | ICD-10-CM

## 2012-08-15 NOTE — Telephone Encounter (Signed)
Patient called stated he saw PCP Dr.Graham Duncan today 08/15/12 for dizziness. States he did a EKG and it was normal.Patient was told I received a message from Dr.Jordan advising he wants to see you.Dr.Jordan has not seen you since cardioversion in 06/29/12.Marland KitchenAppointment scheduled with Dr.Jordan 08/16/12 at 4:00 pm.

## 2012-08-15 NOTE — Progress Notes (Signed)
Was dx'd with AF and cardioverted.  L CEA also done 2013  He has relatively recent h/o vertigo, over the last few months.  He didn't have dizzy spells until starting xarelto.  He didn't have episodes when he was briefly off the medicine.  The room spins.  "Dizzy spells."  Prev episodes have happened when he was sitting, not with position change.  This is different from the feeling of standing too quickly.  Yesterday, early AM, he had a brief episode that was different than prev episodes. He was standing up, trying to urinate.  Then the room started spinning and he felt presyncopal.  He intentionally slid down the wall to prevent a fall.  It got better after about 15 secs, he got up slowly.  Nothing like that since then.  No CP (other than likely chest wall pain on L side after bowling), not SOB with the episode.  He had some mild chest tightness ~82min after the episode where he slid down the wall.  It resolved after a few hours.  No heart racing.  No h/o focal neuro deficits.    Meds, vitals, and allergies reviewed.   ROS: See HPI.  Otherwise, noncontributory.  GEN: nad, alert and oriented HEENT: mucous membranes moist, tm wnl x2 NECK: supple w/o LA CV: rrr.  PULM: ctab, no inc wob ABD: soft, +bs EXT: no edema SKIN: no acute rash CN 2-12 wnl B, S/S/DTR wnl x4 and DHP neg B

## 2012-08-15 NOTE — Telephone Encounter (Signed)
Spoke to patient he stated he has been having dizzy spells.States feels like objects spinning around.Patient advised to call PCP.

## 2012-08-15 NOTE — Telephone Encounter (Signed)
They have questions regarding new medication pt seems to be having side effects like dizzy spells, almost fell, feels light headed

## 2012-08-15 NOTE — Patient Instructions (Addendum)
Let me talk with cards and we'll be in touch about options.  I wouldn't change your meds yet.  Take care.

## 2012-08-15 NOTE — Assessment & Plan Note (Signed)
This could be from the xarelto but this isn't clear.  I'll notify cards- I would like their input on changing anticoagulants.  No focal sx or signs today.  He doesn't appear to have evidence to support dx of BPV.   He doesn't have typical orthostatic sx.  He has a nonfocal neuro exam.  EKG is w/o acute changes.

## 2012-08-16 ENCOUNTER — Ambulatory Visit (INDEPENDENT_AMBULATORY_CARE_PROVIDER_SITE_OTHER): Payer: Medicare Other | Admitting: Cardiology

## 2012-08-16 ENCOUNTER — Encounter: Payer: Self-pay | Admitting: Cardiology

## 2012-08-16 ENCOUNTER — Encounter (INDEPENDENT_AMBULATORY_CARE_PROVIDER_SITE_OTHER): Payer: Medicare Other

## 2012-08-16 VITALS — BP 151/85 | HR 77 | Ht 75.5 in | Wt 243.1 lb

## 2012-08-16 DIAGNOSIS — I4891 Unspecified atrial fibrillation: Secondary | ICD-10-CM

## 2012-08-16 DIAGNOSIS — I10 Essential (primary) hypertension: Secondary | ICD-10-CM

## 2012-08-16 DIAGNOSIS — R42 Dizziness and giddiness: Secondary | ICD-10-CM

## 2012-08-16 NOTE — Progress Notes (Signed)
R Herma Carson Date of Birth: 1933/07/14 Medical Record #[id number]  History of Present Illness: [name] is seen for followup of atrial fibrillation. He underwent successful DC cardioversion on December 11. He denies any recurrent palpitations. He states he has had no improvement in his chronic shortness of breath. Over the past week he has experienced symptoms of dizziness and vertigo. Some of his symptoms occur when he is just sitting down and he feels that the room is spinning around. On one occasion he was standing up in his bathroom when he suddenly became lightheaded. He put his hand on the wall to steady himself and in his legs felt extremely weak and he went to the floor. It happened again yesterday when he was just watching the dishes. He states he has some dizziness 1-2 times per day. He thinks that the dizziness started after he began taking Xarelto.  Current Outpatient Prescriptions on File Prior to Visit  Medication Sig Dispense Refill  . amLODipine (NORVASC) 5 MG tablet Take 7.5 mg by mouth at bedtime.      Marland Kitchen atorvastatin (LIPITOR) 80 MG tablet Take 1 tablet (80 mg total) by mouth at bedtime.  30 tablet  5  . cetirizine (ZYRTEC) 10 MG tablet Take 10 mg by mouth daily as needed. For allergies      . Cinnamon 500 MG capsule Take 500 mg by mouth daily.      . clobetasol (TEMOVATE) 0.05 % ointment Apply 1 application topically 2 (two) times daily as needed. For rash      . fish oil-omega-3 fatty acids 1000 MG capsule Take 1 g by mouth 3 (three) times daily.        . hydrochlorothiazide (MICROZIDE) 12.5 MG capsule Take 12.5 mg by mouth daily.      . Multiple Vitamins-Minerals (CENTRUM) tablet Take 1 tablet by mouth daily.        Marland Kitchen oxyCODONE-acetaminophen (ROXICET) 5-325 MG per tablet Take 1 tablet by mouth every 4 (four) hours as needed for pain.  30 tablet  0  . psyllium (REGULOID) 0.52 G capsule Take 0.52 g by mouth daily.      . ramipril (ALTACE) 10 MG capsule Take 10 mg by mouth  daily.      . Tamsulosin HCl (FLOMAX) 0.4 MG CAPS Take 1 capsule (0.4 mg total) by mouth daily.  30 capsule  6    No Known Allergies  Past Medical History  Diagnosis Date  . HLD (hyperlipidemia)   . HTN (hypertension)   . BPH (benign prostatic hypertrophy)   . Cancer     lung cancer dx'd 2002  . Abdominal bruit 05/05/2012  . Atrial fibrillation   . Diabetes mellitus type II     diet controlled   . Arthritis     back pain, past lumbar surgery   . Carotid arterial disease     Past Surgical History  Procedure Date  . Hemorrhoid surgery 1970  . Cataract extraction     right  . Retinal detachment surgery     right  . Wedge resection 03/15/01    [facility] Dorris Fetch)  . Lobectomy 03/15/01    [facility] Dorris Fetch)  . Spine surgery approx 2006    Ruptered Disc  . Total knee arthroplasty     x2 per Dr. Rayburn Ma  . Joint replacement     knee- bilateral- 2009  . Eye surgery     R detached retina- 1990's, IOL implants also   . Tonsillectomy  1939  . Endarterectomy 05/30/2012    Procedure: ENDARTERECTOMY CAROTID;  Surgeon: Sherren Kerns, MD;  Location: [facility] OR;  Service: Vascular;  Laterality: Left;  . Cardioversion 06/29/2012    Procedure: CARDIOVERSION;  Surgeon: Vesta Mixer, MD;  Location: [facility] ENDOSCOPY;  Service: Cardiovascular;  Laterality: N/A;    History  Smoking status  . Former Smoker -- 1.0 packs/day for 32 years  . Types: Cigarettes  . Quit date: 09/03/1982  Smokeless tobacco  . Never Used    History  Alcohol Use No    Comment: quit 2003 w exception of wine in winter (a little)     Family History  Problem Relation Age of Onset  . Emphysema Father   . Dementia Mother   . Diabetes Mother   . Hypertension Mother   . Hypothyroidism Sister   . Prostate cancer Neg Hx   . Colon cancer Neg Hx   . Dementia Brother   . Diabetes Son     Review of Systems: The review of systems is as noted in history of present illness. He's had no bleeding problems. All  other systems were reviewed and are negative.  Physical Exam: BP 151/85  Pulse 77  Ht 6' 3.5" (1.918 m)  Wt 243 lb 1.9 oz (110.279 kg)  BMI 29.99 kg/m2 He is a pleasant white male in no acute distress.The patient is alert and oriented x 3.  The HEENT exam reveals that the sclera are nonicteric. No nystagmus. The mucous membranes are moist.  The carotids are 2+ with a left carotid endarterectomy scar. There is no thyromegaly.  There is no JVD.  The lungs are clear.   The heart exam reveals an irregular rate with a normal S1 and S2.  There are no murmurs, gallops, or rubs.  The PMI is not displaced.   Abdominal exam reveals good bowel sounds.    There is no hepatosplenomegaly or tenderness.  There is a loud abdominal bruit. There are no masses.  Exam of the legs reveal no clubbing, cyanosis, or edema.  The legs are without rashes.  The distal pulses are intact.  Cranial nerves II - XII are intact.  Motor and sensory functions are intact.  The gait is normal.  LABORATORY DATA:  ECG from 08/15/2012 shows normal sinus rhythm with first degree AV block and PACs.  Assessment / Plan: 1. Atrial fibrillation. Status post successful cardioversion in December. He is maintaining sinus rhythm. Unfortunately his symptoms of dyspnea have not improved. He is now experiencing symptoms of dizziness and near syncope. I recommended an event monitor to make sure he is not having any significant arrhythmia. Since he is 6 weeks out from his cardioversion we will stop his anticoagulation at this point. He understands that if he has any recurrent atrial fibrillation he will need to go on anticoagulation permanently.  2. Left carotid stenosis. Status post left carotid endarterectomy.  3. Abdominal bruit. Abdominal ultrasound showed no evidence of aneurysm.  4. Hypertension. Controlled.  5. Hyperlipidemia.  6. Diabetes mellitus type 2.  7. History of lung CA status post resection and chemotherapy in 2002.

## 2012-08-16 NOTE — Patient Instructions (Signed)
Stop taking Xarelto. Resume ASA 324 mg daily.  We will have you wear a monitor for 3 weeks.

## 2012-09-13 ENCOUNTER — Telehealth: Payer: Self-pay

## 2012-09-13 NOTE — Telephone Encounter (Signed)
Patient called was told Dr.Jordan reviewed event monitor which revealed 3.2 second pause with complete heart block.Dr.Jordan advised needs to see EP for possible pacemaker.Schedulers will call to schedule appointment.

## 2012-09-15 ENCOUNTER — Ambulatory Visit (INDEPENDENT_AMBULATORY_CARE_PROVIDER_SITE_OTHER): Payer: Medicare Other | Admitting: Internal Medicine

## 2012-09-15 ENCOUNTER — Encounter: Payer: Self-pay | Admitting: Internal Medicine

## 2012-09-15 ENCOUNTER — Telehealth: Payer: Self-pay | Admitting: Family Medicine

## 2012-09-15 VITALS — BP 111/57 | HR 82 | Ht 75.5 in | Wt 244.0 lb

## 2012-09-15 DIAGNOSIS — I951 Orthostatic hypotension: Secondary | ICD-10-CM

## 2012-09-15 DIAGNOSIS — I10 Essential (primary) hypertension: Secondary | ICD-10-CM

## 2012-09-15 NOTE — Telephone Encounter (Signed)
Call from Dr. Johney Frame. Pt with orthostasis. Will stop HCTZ. Would continue flomax for now, though we may need to revisit this in the future depending on clinical course.

## 2012-09-15 NOTE — Patient Instructions (Signed)
Your physician recommends that you schedule a follow-up appointment in: 6 weeks with DrAllred   Your physician has recommended you make the following change in your medication:  1) STOP HCTZ

## 2012-09-18 DIAGNOSIS — I441 Atrioventricular block, second degree: Secondary | ICD-10-CM | POA: Insufficient documentation

## 2012-09-18 DIAGNOSIS — R42 Dizziness and giddiness: Secondary | ICD-10-CM | POA: Insufficient documentation

## 2012-09-18 NOTE — Progress Notes (Signed)
Primary Care Physician: Crawford Givens, MD Referring Physician:  Dr [place]   R Guy Koloski is a 79 y.o. male with a h/o atrial fibrillation and recent presyncope who presents today for EP consultation.  He is followed by Dr [place]and and underwent successful DC cardioversion on December 11.  He continued to have chronic SOB.  He reports symptoms of dizziness, mostly postural.  He had several episodes of extreme postural dizziness.  He has had several occasions upon standing that he suddenly became lightheaded and went to the floor.  He denies tachypalpitations, CP, or frank syncope.  He is otherwise without complaint at this time.   Past Medical History  Diagnosis Date  . HLD (hyperlipidemia)   . HTN (hypertension)   . BPH (benign prostatic hypertrophy)   . Cancer     lung cancer dx'd 2002  . Abdominal bruit 05/05/2012  . Atrial fibrillation   . Diabetes mellitus type II     diet controlled   . Arthritis     back pain, past lumbar surgery   . Carotid arterial disease    Past Surgical History  Procedure Laterality Date  . Hemorrhoid surgery  1970  . Cataract extraction      right  . Retinal detachment surgery      right  . Wedge resection  03/15/01    [facility] Dorris Fetch)  . Lobectomy  03/15/01    [facility] Dorris Fetch)  . Spine surgery  approx 2006    Ruptered Disc  . Total knee arthroplasty      x2 per Dr. Rayburn Ma  . Joint replacement      knee- bilateral- 2009  . Eye surgery      R detached retina- 1990's, IOL implants also   . Tonsillectomy      1939  . Endarterectomy  05/30/2012    Procedure: ENDARTERECTOMY CAROTID;  Surgeon: Sherren Kerns, MD;  Location: [facility] OR;  Service: Vascular;  Laterality: Left;  . Cardioversion  06/29/2012    Procedure: CARDIOVERSION;  Surgeon: Vesta Mixer, MD;  Location: [facility] ENDOSCOPY;  Service: Cardiovascular;  Laterality: N/A;    Current Outpatient Prescriptions  Medication Sig Dispense Refill  . amLODipine (NORVASC) 5 MG tablet Take 7.5 mg  by mouth at bedtime.      Marland Kitchen atorvastatin (LIPITOR) 80 MG tablet Take 1 tablet (80 mg total) by mouth at bedtime.  30 tablet  5  . cetirizine (ZYRTEC) 10 MG tablet Take 10 mg by mouth daily as needed. For allergies      . Cinnamon 500 MG capsule Take 500 mg by mouth daily.      . clobetasol (TEMOVATE) 0.05 % ointment Apply 1 application topically 2 (two) times daily as needed. For rash      . fish oil-omega-3 fatty acids 1000 MG capsule Take 1 g by mouth 3 (three) times daily.        . Multiple Vitamins-Minerals (CENTRUM) tablet Take 1 tablet by mouth daily.        Marland Kitchen oxyCODONE-acetaminophen (ROXICET) 5-325 MG per tablet Take 1 tablet by mouth every 4 (four) hours as needed for pain.  30 tablet  0  . psyllium (REGULOID) 0.52 G capsule Take 0.52 g by mouth daily.      . ramipril (ALTACE) 10 MG capsule Take 10 mg by mouth daily.      . Tamsulosin HCl (FLOMAX) 0.4 MG CAPS Take 1 capsule (0.4 mg total) by mouth daily.  30 capsule  6  No current facility-administered medications for this visit.    No Known Allergies  History   Social History  . Marital Status: Married    Spouse Name: N/A    Number of Children: 3  . Years of Education: N/A   Occupational History  . Fired then [employer]    Social History Main Topics  . Smoking status: Former Smoker -- 1.00 packs/day for 32 years    Types: Cigarettes    Quit date: 09/03/1982  . Smokeless tobacco: Never Used  . Alcohol Use: No     Comment: quit 2003 w exception of wine in winter (a little)   . Drug Use: No  . Sexually Active: Yes   Other Topics Concern  . Not on file   Social History Narrative   Retired Counsellor   From [place]   Married happily since 1964    Family History  Problem Relation Age of Onset  . Emphysema Father   . Dementia Mother   . Diabetes Mother   . Hypertension Mother   . Hypothyroidism Sister   . Prostate cancer Neg Hx   . Colon cancer Neg Hx   . Dementia Brother   . Diabetes  Son     ROS- All systems are reviewed and negative except as per the HPI above.  In addition, he reports BPH symptoms previously for which he is presently or flomax.  He reports trying to stop flomax previously but had to restart it.  Physical Exam: Filed Vitals:   09/15/12 1124 09/15/12 1125 09/15/12 1126 09/15/12 1127  BP: 135/80 126/59 126/66 111/57  Pulse: 83 85 82 82  Height:      Weight:        GEN- The patient is well appearing, alert and oriented x 3 today.   Head- normocephalic, atraumatic Eyes-  Sclera clear, conjunctiva pink Ears- hearing intact Oropharynx- clear Neck- supple,  Lungs- Clear to ausculation bilaterally, normal work of breathing Heart- Regular rate and rhythm, no murmurs, rubs or gallops, PMI not laterally displaced GI- soft, NT, ND, + BS Extremities- no clubbing, cyanosis, or edema MS- no significant deformity or atrophy Skin- no rash or lesion Psych- euthymic mood, full affect Neuro- strength and sensation are intact  EKG today reveals sinus rhythm 75 bpm, septal infarct, otherwise normal ekg Event monitor 08/17/12-09/07/12 is reviewed and reveals sinus rhythm, mobitz I type AV block is observed with clear PP interval and PR interval prolongation and multiple consecutive p waves not conducted.  He was asymptomatic with this.  Assessment and Plan:

## 2012-09-18 NOTE — Assessment & Plan Note (Signed)
I have reviewed the patients event monitor which reveals an episode of AV block.  There is PP interval prolongation as well as PR prolongation and then consecutive p waves not conducted.  He reports that he was asymptomatic with this.  This appears to be vagal in origin and does not appear to be pathologic.  I do not feel that he has an indication for PPM at this time.  We will follow clinically.

## 2012-09-18 NOTE — Assessment & Plan Note (Signed)
Stop hctz and follow Given postural symptoms, it may be best to let his BP run a little high if necessary

## 2012-09-18 NOTE — Assessment & Plan Note (Signed)
The patient presents for further evaluation of symptoms of presyncope.  He has occasional vertiginous spinning at rest.  His primary concern however is postural dizziness and presyncope.  This is almost always positional.  He is orthostatic in the office today.  I do not think that AV block observed on event monitor is responsible.  HCTZ and flomax are more likely the cause. I will stop hctz today and have encouraged hydration.  I have spoken with Dr Para March about stopping flomax also.  He is reluctant to do so at this time.  I think we can follow clinically and if symptoms do not improve then we should transition from flomax to finasteride. Supportive measures including rising slowly from a seated position was also discussed today.

## 2012-09-18 NOTE — Assessment & Plan Note (Signed)
Maintaining sinus rhythm Continue anticoagulation No changes today

## 2012-09-27 ENCOUNTER — Other Ambulatory Visit: Payer: Self-pay | Admitting: *Deleted

## 2012-09-27 MED ORDER — RAMIPRIL 10 MG PO CAPS: 10.0000 mg | ORAL_CAPSULE | Freq: Every day | ORAL | Status: DC

## 2012-10-06 ENCOUNTER — Encounter: Payer: Self-pay | Admitting: Cardiology

## 2012-10-06 ENCOUNTER — Ambulatory Visit (INDEPENDENT_AMBULATORY_CARE_PROVIDER_SITE_OTHER): Payer: Medicare Other | Admitting: Cardiology

## 2012-10-06 VITALS — BP 154/78 | HR 73 | Ht 75.5 in | Wt 241.0 lb

## 2012-10-06 DIAGNOSIS — I441 Atrioventricular block, second degree: Secondary | ICD-10-CM

## 2012-10-06 DIAGNOSIS — I4891 Unspecified atrial fibrillation: Secondary | ICD-10-CM

## 2012-10-06 MED ORDER — AMLODIPINE BESYLATE 5 MG PO TABS: 5.0000 mg | ORAL_TABLET | Freq: Every day | ORAL | Status: DC

## 2012-10-06 MED ORDER — RAMIPRIL 5 MG PO CAPS: 5.0000 mg | ORAL_CAPSULE | Freq: Every day | ORAL | Status: DC

## 2012-10-06 NOTE — Patient Instructions (Signed)
Reduce amlodipine to 5 mg daily  Reduce ramipril to 5 mg daily   Continue your other medication.  I will see you in 3 months.

## 2012-10-06 NOTE — Progress Notes (Signed)
R Herma Carson Date of Birth: 1932-09-20 Medical Record #[id number]  History of Present Illness: Mr. Antosh is seen for followup of atrial fibrillation and dizziness. He underwent successful DC cardioversion on December 11. He denies any recurrent palpitations. He states he has had no improvement in his chronic shortness of breath. He has since developed symptoms of lightheadedness particularly after exercise. He denies any true vertigo. His symptoms are postural. His hydrochlorothiazide was discontinued but his symptoms persist. He does note some hearing loss. We did have him wear an event monitor which demonstrated no recurrent atrial fibrillation. He did have a 3.2 second pulse width second degree heart block. Patient was seen by Dr. Johney Frame who felt that this was not a clinically significant rhythm and that pacemaker was not indicated at this time.  Current Outpatient Prescriptions on File Prior to Visit  Medication Sig Dispense Refill  . atorvastatin (LIPITOR) 80 MG tablet Take 1 tablet (80 mg total) by mouth at bedtime.  30 tablet  5  . cetirizine (ZYRTEC) 10 MG tablet Take 10 mg by mouth daily as needed. For allergies      . Cinnamon 500 MG capsule Take 500 mg by mouth daily.      . clobetasol (TEMOVATE) 0.05 % ointment Apply 1 application topically 2 (two) times daily as needed. For rash      . fish oil-omega-3 fatty acids 1000 MG capsule Take 1 g by mouth 3 (three) times daily.        . Multiple Vitamins-Minerals (CENTRUM) tablet Take 1 tablet by mouth daily.        Marland Kitchen oxyCODONE-acetaminophen (ROXICET) 5-325 MG per tablet Take 1 tablet by mouth every 4 (four) hours as needed for pain.  30 tablet  0  . psyllium (REGULOID) 0.52 G capsule Take 0.52 g by mouth daily.      . Tamsulosin HCl (FLOMAX) 0.4 MG CAPS Take 1 capsule (0.4 mg total) by mouth daily.  30 capsule  6   No current facility-administered medications on file prior to visit.    No Known Allergies  Past Medical History   Diagnosis Date  . HLD (hyperlipidemia)   . HTN (hypertension)   . BPH (benign prostatic hypertrophy)   . Cancer     lung cancer dx'd 2002  . Abdominal bruit 05/05/2012  . Atrial fibrillation   . Diabetes mellitus type II     diet controlled   . Arthritis     back pain, past lumbar surgery   . Carotid arterial disease     Past Surgical History  Procedure Laterality Date  . Hemorrhoid surgery  1970  . Cataract extraction      right  . Retinal detachment surgery      right  . Wedge resection  03/15/01    [facility] Dorris Fetch)  . Lobectomy  03/15/01    [facility] Dorris Fetch)  . Spine surgery  approx 2006    Ruptered Disc  . Total knee arthroplasty      x2 per Dr. Rayburn Ma  . Joint replacement      knee- bilateral- 2009  . Eye surgery      R detached retina- 1990's, IOL implants also   . Tonsillectomy      1939  . Endarterectomy  05/30/2012    Procedure: ENDARTERECTOMY CAROTID;  Surgeon: Sherren Kerns, MD;  Location: [facility] OR;  Service: Vascular;  Laterality: Left;  . Cardioversion  06/29/2012    Procedure: CARDIOVERSION;  Surgeon: Vesta Mixer, MD;  Location: [facility] ENDOSCOPY;  Service: Cardiovascular;  Laterality: N/A;    History  Smoking status  . Former Smoker -- 1.00 packs/day for 32 years  . Types: Cigarettes  . Quit date: 09/03/1982  Smokeless tobacco  . Never Used    History  Alcohol Use No    Comment: quit 2003 w exception of wine in winter (a little)     Family History  Problem Relation Age of Onset  . Emphysema Father   . Dementia Mother   . Diabetes Mother   . Hypertension Mother   . Hypothyroidism Sister   . Prostate cancer Neg Hx   . Colon cancer Neg Hx   . Dementia Brother   . Diabetes Son     Review of Systems: The review of systems is as noted in history of present illness.  All other systems were reviewed and are negative.  Physical Exam: BP 154/78  Pulse 73  Ht 6' 3.5" (1.918 m)  Wt 241 lb (109.317 kg)  BMI 29.72 kg/m2  SpO2 96% He is  a pleasant white male in no acute distress.The patient is alert and oriented x 3.  The HEENT exam is unremarkable. The carotids are 2+ with a left carotid endarterectomy scar. There is no thyromegaly.  There is no JVD.  The lungs are clear.   The heart exam reveals a regular rate with a normal S1 and S2.  There are no murmurs, gallops, or rubs.  The PMI is not displaced.   Abdominal exam reveals good bowel sounds.      Exam of the legs reveal no clubbing, cyanosis, or edema.  The legs are without rashes.  The distal pulses are intact.  Cranial nerves II - XII are intact.  Motor and sensory functions are intact.  The gait is normal.  I performed orthostatic blood pressure check. Blood pressure was 128/60 supine, 120/60 sitting, and 100/52 standing. LABORATORY DATA:   Assessment / Plan: 1. Atrial fibrillation. Status post successful cardioversion in December. He is maintaining sinus rhythm. No recurrence on his event monitor. We will continue to observe.  2. Postural dizziness. Patient is orthostatic. I recommended further reduction in his blood pressure medications and we'll reduce his amlodipine to 5 mg daily and his Altace to 5 mg daily. He is going to monitor his blood pressure readings at home. Ideally we would like to stop his Flomax but this would need to be cleared by his primary care or  urologist. I'll see him back in 3 months.  3. Abdominal bruit. Abdominal ultrasound showed no evidence of aneurysm.  4. Hypertension. Controlled.  5. Hyperlipidemia.  6. Diabetes mellitus type 2.  7. History of lung CA status post resection and chemotherapy in 2002.  8. Status post left carotid endarterectomy.

## 2012-10-19 ENCOUNTER — Telehealth: Payer: Self-pay | Admitting: Oncology

## 2012-10-19 NOTE — Telephone Encounter (Signed)
Called pt and left message regarding appt being moved from am to PM on 5/6 per Windy Kalata

## 2012-10-26 ENCOUNTER — Other Ambulatory Visit: Payer: Self-pay | Admitting: *Deleted

## 2012-10-26 MED ORDER — ATORVASTATIN CALCIUM 80 MG PO TABS: 80.0000 mg | ORAL_TABLET | Freq: Every day | ORAL | Status: DC

## 2012-10-31 ENCOUNTER — Ambulatory Visit: Payer: Medicare Other | Admitting: Internal Medicine

## 2012-11-07 ENCOUNTER — Encounter: Payer: Self-pay | Admitting: *Deleted

## 2012-11-22 ENCOUNTER — Ambulatory Visit: Payer: Medicare Other | Admitting: Oncology

## 2012-11-22 ENCOUNTER — Encounter: Payer: Self-pay | Admitting: Oncology

## 2012-11-22 ENCOUNTER — Ambulatory Visit (HOSPITAL_BASED_OUTPATIENT_CLINIC_OR_DEPARTMENT_OTHER): Payer: Medicare Other | Admitting: Oncology

## 2012-11-22 ENCOUNTER — Other Ambulatory Visit: Payer: Medicare Other | Admitting: Lab

## 2012-11-22 ENCOUNTER — Other Ambulatory Visit (HOSPITAL_BASED_OUTPATIENT_CLINIC_OR_DEPARTMENT_OTHER): Payer: Medicare Other | Admitting: Lab

## 2012-11-22 DIAGNOSIS — C3490 Malignant neoplasm of unspecified part of unspecified bronchus or lung: Secondary | ICD-10-CM

## 2012-11-22 DIAGNOSIS — R0989 Other specified symptoms and signs involving the circulatory and respiratory systems: Secondary | ICD-10-CM

## 2012-11-22 DIAGNOSIS — Z85118 Personal history of other malignant neoplasm of bronchus and lung: Secondary | ICD-10-CM

## 2012-11-22 LAB — CBC WITH DIFFERENTIAL/PLATELET
BASO%: 1.2 % (ref 0.0–2.0)
LYMPH%: 25.1 % (ref 14.0–49.0)
MCHC: 33.2 g/dL (ref 32.0–36.0)
MCV: 87.2 fL (ref 79.3–98.0)
MONO#: 0.7 10*3/uL (ref 0.1–0.9)
MONO%: 10.1 % (ref 0.0–14.0)
Platelets: 194 10*3/uL (ref 140–400)
RBC: 4.61 10*6/uL (ref 4.20–5.82)
WBC: 6.9 10*3/uL (ref 4.0–10.3)

## 2012-11-22 LAB — COMPREHENSIVE METABOLIC PANEL (CC13)
ALT: 39 U/L (ref 0–55)
AST: 25 U/L (ref 5–34)
Creatinine: 0.9 mg/dL (ref 0.7–1.3)
Total Bilirubin: 0.56 mg/dL (ref 0.20–1.20)

## 2012-11-22 LAB — LACTATE DEHYDROGENASE (CC13): LDH: 166 U/L (ref 125–245)

## 2012-11-22 NOTE — Progress Notes (Signed)
This office note has been dictated.  #[id number]

## 2012-11-23 NOTE — Progress Notes (Signed)
CC:   Jonathan York. Dorris Fetch, M.D. Dwana Curd Para March, M.D.  PROBLEM LIST:  1. 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 Both of these were non  mucinous sclerosing subtypes. The patient underwent left upper  lobectomy and lymph node sampling on 03/15/2001. Tumor stage was  T4 N0 M0, i.e. stage IIIB. All 14 lymph nodes were negative. The  patient received 4 cycles of adjuvant chemotherapy with carboplatin  and Taxol from 05/04/2001 through 07/14/2001 and has remained  disease-free since that time.  2. Hypertension.  3. Dyslipidemia.  4. Osteoarthritis involving the knees and spine.  5. Right adrenal nodule seen on CT scan in July 2002, felt to be an  adenoma.  6. Status post right L4-L5 laminectomy on 03/05/2006.  7. Status post left total knee arthroplasty on 03/02/2008.  8. Right total knee replacement in October 2011. 9. Left carotid endarterectomy carried out on 05/30/2012 by Dr.     Fabienne Bruns for a stenosis of the left internal carotid artery. 10. Atrial fibrillation, status post cardioversion in December 2012 by     Dr. Kristeen Miss.  MEDICATIONS:  Reviewed and recorded. Current Outpatient Prescriptions  Medication Sig Dispense Refill  . amLODipine (NORVASC) 5 MG tablet Take 1 tablet (5 mg total) by mouth at bedtime.      Marland Kitchen atorvastatin (LIPITOR) 80 MG tablet Take 1 tablet (80 mg total) by mouth at bedtime. * Needs appointment with Dr for additional refills*  90 tablet  0  . cetirizine (ZYRTEC) 10 MG tablet Take 10 mg by mouth daily as needed. Uses rarely for allergies      . Cinnamon 500 MG capsule Take 500 mg by mouth daily.      . clobetasol (TEMOVATE) 0.05 % ointment Apply 1 application topically 2 (two) times daily as needed. Uses about 2x/yr for rash      . fish oil-omega-3 fatty acids 1000 MG capsule Take 1 g by mouth 3 (three) times daily.         . Multiple Vitamins-Minerals (CENTRUM) tablet Take 1 tablet by mouth daily.        Marland Kitchen oxyCODONE-acetaminophen (PERCOCET/ROXICET) 5-325 MG per tablet Take 1 tablet by mouth every 4 (four) hours as needed for pain. Uses about 4x per year.      . psyllium (REGULOID) 0.52 G capsule Take 0.52 g by mouth daily.      . ramipril (ALTACE) 5 MG capsule Take 1 capsule (5 mg total) by mouth daily.  90 capsule  3  . Tamsulosin HCl (FLOMAX) 0.4 MG CAPS Take 1 capsule (0.4 mg total) by mouth daily.  30 capsule  6   No current facility-administered medications for this visit.     SMOKING HISTORY:  Patient smoked a pack to a pack and a half of cigarettes a day starting at age 43 through age 89.  He stopped smoking in 1984.   HISTORY:  Jonathan York was seen today for followup of his bronchoalveolar carcinoma, stage IIIB, dating back to August 2002.  Jonathan York was last seen by [place] on 11/27/2011.  He is doing well at the present time.  He tells me that he and his wife Jonathan York will be celebrating their 50th wedding anniversary in the next few days.  He continues to have dyspnea on exertion which is stable.  The patient tells me that he was found to have  a stenosis of his left internal carotid artery back in November, underwent carotid endarterectomy by Dr. Darrick Penna.  He also had atrial fibrillation about that time and underwent cardioversion in December 2013.  He has been doing well since then.  He is without any complaints or problems today.  His dyspnea on exertion is stable.  No symptoms to suggest recurrence of his lung cancer or any new cancers.  PHYSICAL EXAMINATION:  General:  The patient looks well, although a little overweight.  Weight today is 240 pounds 11.2 ounces, height 6 feet 3-1/2 inches, body surface area 2.41 sq m.  Vital Signs:  Blood pressure today 187/91.  The patient was made aware of his elevated blood pressure.  Other vital signs are normal.  Rhythm is regular.  HEENT: There is no scleral  icterus.  Mouth and pharynx are benign.  There is no peripheral adenopathy palpable.  I could not appreciate any neck bruits. Chest:  No evidence for chest wall recurrence on the left.  There were some harsh breath sounds, perhaps some very slight expiratory wheeze on the left.  Cardiac:  Regular rhythm without murmur or rub.  Abdomen: Benign with no organomegaly or masses palpable.  Extremities:  Trace edema of the right lower leg.  No clubbing.  The patient has undergone bilateral knee replacements.  Neurologic:  Exam is normal.  LABORATORY DATA:  Today, white count 6.9, ANC 4.3, hemoglobin 13.4, hematocrit 40.2, platelets 194,000.  Chemistries today were notable for a glucose of 135 and an albumin of 3.4.  IMAGING STUDIES:  1. Chest x-ray, 2 view, from 11/12/2009 showed no acute disease.  2. Chest x-ray, 2 view, from 11/20/2010 showed stable cardiopulmonary  appearance with postoperative change in the left hemithorax. There  was no evidence for recurrence or other abnormality. 3. Chest x-ray, 2 view, from 11/27/2011 showed postsurgical changes in the left hemithorax with no acute findings.  IMPRESSION AND PLAN:  Jonathan York continues to do well, now almost 12 years from the time of diagnosis without evidence for recurrence.  At this point, I do not think it is necessary for me to continue to see Jonathan York, although it has been my pleasure to have participated in his care.  I certainly will be happy to see him again should any questions or problems arise in the future.    ______________________________ Samul Dada, M.D. DSM/MEDQ  D:  11/22/2012  T:  11/23/2012  Job:  [id number]

## 2012-11-28 ENCOUNTER — Other Ambulatory Visit: Payer: Self-pay | Admitting: *Deleted

## 2012-11-28 MED ORDER — AMLODIPINE BESYLATE 5 MG PO TABS: 5.0000 mg | ORAL_TABLET | Freq: Every day | ORAL | Status: DC

## 2012-11-28 MED ORDER — TAMSULOSIN HCL 0.4 MG PO CAPS: 0.4000 mg | ORAL_CAPSULE | Freq: Every day | ORAL | Status: DC

## 2012-12-21 ENCOUNTER — Encounter: Payer: Self-pay | Admitting: Vascular Surgery

## 2012-12-22 ENCOUNTER — Encounter: Payer: Self-pay | Admitting: Vascular Surgery

## 2012-12-22 ENCOUNTER — Other Ambulatory Visit (INDEPENDENT_AMBULATORY_CARE_PROVIDER_SITE_OTHER): Payer: Medicare Other | Admitting: *Deleted

## 2012-12-22 ENCOUNTER — Ambulatory Visit (INDEPENDENT_AMBULATORY_CARE_PROVIDER_SITE_OTHER): Payer: Medicare Other | Admitting: Vascular Surgery

## 2012-12-22 DIAGNOSIS — I6529 Occlusion and stenosis of unspecified carotid artery: Secondary | ICD-10-CM

## 2012-12-22 DIAGNOSIS — Z48812 Encounter for surgical aftercare following surgery on the circulatory system: Secondary | ICD-10-CM

## 2012-12-22 NOTE — Progress Notes (Signed)
[facility] HISTORY AND PHYSICAL    History of Present Illness:  Patient is a 79 y.o. year old male who presents for followup after left carotid endarterectomy November 2013. Denies headaches, numbness, tingling or other neuro deficits.  No swallowing problems.  The area of numbness under his left jaw has resolved.  He denies claudication or abdominal pain.  Review of systems: He denies chest pain. He denies shortness of breath. He currently goes to the gym 3-5 days per week.  Physical Examination  Filed Vitals:   12/22/12 0921  BP: 136/77  Pulse: 66  Resp:     Body mass index is 29.47 kg/(m^2).  General:  Alert and oriented, no acute distress Neck: No bruit or JVD Skin: No rash Abdomen: Soft nontender nondistended no mass,diastases recti Neurologic: Upper and lower extremity motor 5/5 and symmetric  DATA: Patient had a carotid duplex exam today which I reviewed and interpreted.  He had 40-60% right internal carotid artery stenosis with peak systolic velocity of 201. No significant left-sided stenosis   ASSESSMENT: Doing well status post left carotid endarterectomy   PLAN:  Continue risk factor management followup carotid duplex in 6 months. Continue aspirin   Fabienne Bruns, MD [facility] Office: [phone] Pager: [phone]

## 2012-12-23 NOTE — Addendum Note (Signed)
Addended by: Adria Dill L on: 12/23/2012 11:45 AM   Modules accepted: Orders

## 2013-01-06 ENCOUNTER — Other Ambulatory Visit: Payer: Self-pay | Admitting: Family Medicine

## 2013-01-06 DIAGNOSIS — E119 Type 2 diabetes mellitus without complications: Secondary | ICD-10-CM

## 2013-01-06 DIAGNOSIS — Z125 Encounter for screening for malignant neoplasm of prostate: Secondary | ICD-10-CM

## 2013-01-13 ENCOUNTER — Ambulatory Visit (INDEPENDENT_AMBULATORY_CARE_PROVIDER_SITE_OTHER): Payer: Medicare Other | Admitting: Cardiology

## 2013-01-13 ENCOUNTER — Encounter: Payer: Self-pay | Admitting: Cardiology

## 2013-01-13 VITALS — BP 148/80 | HR 71 | Ht 75.5 in | Wt 238.4 lb

## 2013-01-13 DIAGNOSIS — I441 Atrioventricular block, second degree: Secondary | ICD-10-CM

## 2013-01-13 DIAGNOSIS — I4891 Unspecified atrial fibrillation: Secondary | ICD-10-CM

## 2013-01-13 DIAGNOSIS — I10 Essential (primary) hypertension: Secondary | ICD-10-CM

## 2013-01-13 NOTE — Progress Notes (Signed)
Jonathan York Date of Birth: 11/20/1932 Medical Record #[id number]  History of Present Illness: Jonathan York is seen for followup of atrial fibrillation and dizziness. He underwent successful DC cardioversion on December 11. He denies any recurrent palpitations.  His symptoms of dizziness have improved significantly. His energy level is improved. The only time he gets short of breath now is when he is bending over working. He does not experience any dyspnea when he is working out in [employer]. He had recent followup carotid Dopplers which showed that the left ICA was widely patent. There is 40-60% stenosis on the right. He is scheduled for followup complete physical with his primary care this next month.  Current Outpatient Prescriptions on File Prior to Visit  Medication Sig Dispense Refill  . amLODipine (NORVASC) 5 MG tablet Take 1 tablet (5 mg total) by mouth at bedtime.  45 tablet  6  . atorvastatin (LIPITOR) 80 MG tablet Take 1 tablet (80 mg total) by mouth at bedtime. * Needs appointment with Dr for additional refills*  90 tablet  0  . cetirizine (ZYRTEC) 10 MG tablet Take 10 mg by mouth daily as needed. Uses rarely for allergies      . Cinnamon 500 MG capsule Take 500 mg by mouth daily.      . clobetasol (TEMOVATE) 0.05 % ointment Apply 1 application topically 2 (two) times daily as needed. Uses about 2x/yr for rash      . fish oil-omega-3 fatty acids 1000 MG capsule Take 1 g by mouth 3 (three) times daily.        . Multiple Vitamins-Minerals (CENTRUM) tablet Take 1 tablet by mouth daily.        Marland Kitchen oxyCODONE-acetaminophen (PERCOCET/ROXICET) 5-325 MG per tablet Take 1 tablet by mouth every 4 (four) hours as needed for pain. Uses about 4x per year.      . psyllium (REGULOID) 0.52 G capsule Take 0.52 g by mouth daily.      . ramipril (ALTACE) 5 MG capsule Take 1 capsule (5 mg total) by mouth daily.  90 capsule  3  . tamsulosin (FLOMAX) 0.4 MG CAPS Take 1 capsule (0.4 mg total) by mouth daily.  30  capsule  6   No current facility-administered medications on file prior to visit.    No Active Allergies  Past Medical History  Diagnosis Date  . HLD (hyperlipidemia)   . HTN (hypertension)   . BPH (benign prostatic hypertrophy)   . Cancer     lung cancer dx'd 2002  . Abdominal bruit 05/05/2012  . Atrial fibrillation   . Diabetes mellitus type II     diet controlled   . Arthritis     back pain, past lumbar surgery   . Carotid arterial disease     Past Surgical History  Procedure Laterality Date  . Hemorrhoid surgery  1970  . Cataract extraction      right  . Retinal detachment surgery      right  . Wedge resection  03/15/01    [facility] Dorris Fetch)  . Lobectomy  03/15/01    [facility] Dorris Fetch)  . Spine surgery  approx 2006    Ruptered Disc  . Total knee arthroplasty      x2 per Dr. Rayburn Ma  . Eye surgery      Jonathan detached retina- 1990's, IOL implants also   . Tonsillectomy      1939  . Endarterectomy  05/30/2012    Procedure: ENDARTERECTOMY CAROTID;  Surgeon: Leonette Most  Holland Commons, MD;  Location: [facility] OR;  Service: Vascular;  Laterality: Left;  . Cardioversion  06/29/2012    Procedure: CARDIOVERSION;  Surgeon: Vesta Mixer, MD;  Location: [facility] ENDOSCOPY;  Service: Cardiovascular;  Laterality: N/A;  . Carotid endarterectomy Left 05/30/12  . Joint replacement      knee- bilateral- 2009 and 2012    History  Smoking status  . Former Smoker -- 1.00 packs/day for 32 years  . Types: Cigarettes  . Quit date: 09/03/1982  Smokeless tobacco  . Never Used    History  Alcohol Use No    Comment: quit 2003 w exception of wine in winter (a little)     Family History  Problem Relation Age of Onset  . Emphysema Father   . Dementia Mother   . Diabetes Mother   . Hypertension Mother   . Hypothyroidism Sister   . Prostate cancer Neg Hx   . Colon cancer Neg Hx   . Dementia Brother   . Diabetes Son   . Heart disease Son     Heart Disease before age 50    Review of  Systems: The review of systems is as noted in history of present illness.  All other systems were reviewed and are negative.  Physical Exam: BP 148/80  Pulse 71  Ht 6' 3.5" (1.918 m)  Wt 238 lb 6.4 oz (108.138 kg)  BMI 29.4 kg/m2  SpO2 97% He is a pleasant white male in no acute distress.The patient is alert and oriented x 3.  The HEENT exam is unremarkable. The carotids are 2+ with a left carotid endarterectomy scar. There is no thyromegaly.  There is no JVD.  The lungs are clear.   The heart exam reveals a regular rate with a normal S1 and S2.  There are no murmurs, gallops, or rubs.  The PMI is not displaced.   Abdominal exam reveals good bowel sounds.      Exam of the legs reveal no clubbing, cyanosis, or edema.  The legs are without rashes.  The distal pulses are intact.  Cranial nerves II - XII are intact.  Motor and sensory functions are intact.  The gait is normal.   LABORATORY DATA:   Assessment / Plan: 1. Atrial fibrillation. Status post successful cardioversion in December 2013. He is maintaining sinus rhythm. Continue to monitor for recurrent symptoms.  2. Postural dizziness. Significantly improved with reduction in his antihypertensive therapy. Blood pressure remains well-controlled.  3. Abdominal bruit. Abdominal ultrasound showed no evidence of aneurysm.  4. Hypertension. Controlled.  5. Hyperlipidemia.  6. Diabetes mellitus type 2.  7. History of lung CA status post resection and chemotherapy in 2002.  8. Status post left carotid endarterectomy.

## 2013-01-13 NOTE — Patient Instructions (Signed)
Continue your current therapy  I will see you in 6 months.   

## 2013-01-17 ENCOUNTER — Other Ambulatory Visit (INDEPENDENT_AMBULATORY_CARE_PROVIDER_SITE_OTHER): Payer: Medicare Other

## 2013-01-17 DIAGNOSIS — E785 Hyperlipidemia, unspecified: Secondary | ICD-10-CM

## 2013-01-17 DIAGNOSIS — I10 Essential (primary) hypertension: Secondary | ICD-10-CM

## 2013-01-17 DIAGNOSIS — Z125 Encounter for screening for malignant neoplasm of prostate: Secondary | ICD-10-CM

## 2013-01-17 DIAGNOSIS — N400 Enlarged prostate without lower urinary tract symptoms: Secondary | ICD-10-CM

## 2013-01-17 DIAGNOSIS — E119 Type 2 diabetes mellitus without complications: Secondary | ICD-10-CM

## 2013-01-17 DIAGNOSIS — Z0000 Encounter for general adult medical examination without abnormal findings: Secondary | ICD-10-CM

## 2013-01-17 LAB — PSA, MEDICARE: PSA: 1.72 ng/ml (ref 0.10–4.00)

## 2013-01-17 LAB — HEMOGLOBIN A1C: Hgb A1c MFr Bld: 7.3 % — ABNORMAL HIGH (ref 4.6–6.5)

## 2013-01-17 LAB — LIPID PANEL: Total CHOL/HDL Ratio: 3

## 2013-01-24 ENCOUNTER — Encounter: Payer: Self-pay | Admitting: Family Medicine

## 2013-01-24 ENCOUNTER — Ambulatory Visit (INDEPENDENT_AMBULATORY_CARE_PROVIDER_SITE_OTHER)
Admission: RE | Admit: 2013-01-24 | Discharge: 2013-01-24 | Disposition: A | Discharge status: Home / Self Care | Payer: Medicare Other | Source: Ambulatory Visit | Attending: Family Medicine | Admitting: Family Medicine

## 2013-01-24 ENCOUNTER — Ambulatory Visit (INDEPENDENT_AMBULATORY_CARE_PROVIDER_SITE_OTHER): Payer: Medicare Other | Admitting: Family Medicine

## 2013-01-24 VITALS — BP 140/70 | HR 74 | Temp 97.7°F | Ht 76.0 in | Wt 236.5 lb

## 2013-01-24 DIAGNOSIS — I4891 Unspecified atrial fibrillation: Secondary | ICD-10-CM

## 2013-01-24 DIAGNOSIS — M549 Dorsalgia, unspecified: Secondary | ICD-10-CM

## 2013-01-24 DIAGNOSIS — Z85118 Personal history of other malignant neoplasm of bronchus and lung: Secondary | ICD-10-CM

## 2013-01-24 DIAGNOSIS — E119 Type 2 diabetes mellitus without complications: Secondary | ICD-10-CM

## 2013-01-24 DIAGNOSIS — R0989 Other specified symptoms and signs involving the circulatory and respiratory systems: Secondary | ICD-10-CM

## 2013-01-24 DIAGNOSIS — I10 Essential (primary) hypertension: Secondary | ICD-10-CM

## 2013-01-24 DIAGNOSIS — C3490 Malignant neoplasm of unspecified part of unspecified bronchus or lung: Secondary | ICD-10-CM

## 2013-01-24 DIAGNOSIS — Z0000 Encounter for general adult medical examination without abnormal findings: Secondary | ICD-10-CM

## 2013-01-24 MED ORDER — ATORVASTATIN CALCIUM 80 MG PO TABS: 80.0000 mg | ORAL_TABLET | Freq: Every day | ORAL | Status: DC

## 2013-01-24 NOTE — Progress Notes (Signed)
I have personally reviewed the Medicare Annual Wellness questionnaire and have noted 1. The patient's medical and social history 2. Their use of alcohol, tobacco or illicit drugs 3. Their current medications and supplements 4. The patient's functional ability including ADL's, fall risks, home safety risks and hearing or visual             impairment. 5. Diet and physical activities 6. Evidence for depression or mood disorders  The patients weight, height, BMI have been recorded in the chart and visual acuity is per eye clinic.  I have made referrals, counseling and provided education to the patient based review of the above and I have provided the pt with a written personalized care plan for preventive services.  See scanned forms.  Routine anticipatory guidance given to patient.  See health maintenance. Flu 2013 Shingles 2011 PNA 2009 Tetanus 2004 D/w patient [id number] for colon cancer screening, including IFOB vs. colonoscopy.  Risks and benefits of both were discussed and patient voiced understanding.  Pt declines screening and this is reasonable.  Prostate cancer screening d/w pt.  PSA wnl.  Prostate cancer screening and PSA options (with potential risks and benefits of testing vs not testing) were discussed along with recent recs/guidelines.  He declined future testing of PSA.  Advance directive d/w pt.  Has a living will.  Wife is HCPOA per patient.  Cognitive function addressed- see scanned forms- and if abnormal then additional documentation follows.   He was "cut loose" by the cancer clinic for lung cancer.  Will likely need yearly CXR but can be followed here.  No cough, hemoptysis.  Feels well.   L CEA done. Had f/u with vascular.    AF s/p cardioversion.  Doing well.  No CP, SOB.  No palpitations.    Hypertension:    Using medication without problems or lightheadedness: yes, sig less lightheadedness.   Chest pain with exertion:no Edema:no Short of breath:no  Diabetes:   No meds Hypoglycemic episodes:no sx Hyperglycemic episodes:no sx Feet problems: no Blood Sugars averaging: rarely checked, not elevated per patient.  eye exam within last year:yes A1c 7.3.  He had more ice cream recently, discussed cutting back.    Oxycodone taken rarely for back pain flares.    PMH and SH reviewed  Meds, vitals, and allergies reviewed.   ROS: See HPI.  Otherwise negative.    GEN: nad, alert and oriented HEENT: mucous membranes moist NECK: supple w/o LA CV: rrr. PULM: ctab, no inc wob ABD: soft, +bs EXT: no edema SKIN: no acute rash  Diabetic foot exam: Normal inspection No skin breakdown No calluses  Normal DP pulses Normal sensation to light touch and monofilament Nails normal

## 2013-01-24 NOTE — Patient Instructions (Addendum)
Go to the lab on the way out.  We'll contact you with your xray report.  Take care.  Recheck in about 6 months with A1c ahead of time.   Cut back on the ice cream.  I would get a flu shot each fall.

## 2013-01-25 DIAGNOSIS — M549 Dorsalgia, unspecified: Secondary | ICD-10-CM | POA: Insufficient documentation

## 2013-01-25 NOTE — Assessment & Plan Note (Signed)
Reasonable control, continue as is.  Will work on diet.

## 2013-01-25 NOTE — Assessment & Plan Note (Signed)
See scanned forms.  Routine anticipatory guidance given to patient.  See health maintenance. Flu 2013 Shingles 2011 PNA 2009 Tetanus 2004 D/w patient [id number] for colon cancer screening, including IFOB vs. colonoscopy.  Risks and benefits of both were discussed and patient voiced understanding.  Pt declines screening and this is reasonable.  Prostate cancer screening d/w pt.  PSA wnl.  Prostate cancer screening and PSA options (with potential risks and benefits of testing vs not testing) were discussed along with recent recs/guidelines.  He declined future testing of PSA.  Advance directive d/w pt.  Has a living will.  Wife is HCPOA per patient.  Cognitive function addressed- see scanned forms- and if abnormal then additional documentation follows.

## 2013-01-25 NOTE — Assessment & Plan Note (Signed)
S/p cardioversion and doing well.  No change in meds.

## 2013-01-25 NOTE — Assessment & Plan Note (Signed)
Doing well s/p CEA.

## 2013-01-25 NOTE — Assessment & Plan Note (Signed)
He was "cut loose" by the cancer clinic for lung cancer. Will likely need yearly CXR but can be followed here. No cough, hemoptysis. Feels well.

## 2013-01-25 NOTE — Assessment & Plan Note (Signed)
Rare use of oxycodone with flares of pain.

## 2013-01-25 NOTE — Assessment & Plan Note (Signed)
No meds added, will have pt work on diet and recheck as scheduled.  He agrees.  Labs d/w pt.

## 2013-03-29 ENCOUNTER — Ambulatory Visit (INDEPENDENT_AMBULATORY_CARE_PROVIDER_SITE_OTHER): Payer: Medicare Other | Admitting: Family Medicine

## 2013-03-29 DIAGNOSIS — Z23 Encounter for immunization: Secondary | ICD-10-CM

## 2013-06-28 ENCOUNTER — Encounter: Payer: Self-pay | Admitting: Family

## 2013-06-29 ENCOUNTER — Ambulatory Visit (INDEPENDENT_AMBULATORY_CARE_PROVIDER_SITE_OTHER): Payer: Medicare Other | Admitting: Family

## 2013-06-29 ENCOUNTER — Encounter: Payer: Self-pay | Admitting: Family

## 2013-06-29 ENCOUNTER — Ambulatory Visit (HOSPITAL_COMMUNITY)
Admission: RE | Admit: 2013-06-29 | Discharge: 2013-06-29 | Disposition: A | Discharge status: Home / Self Care | Payer: Medicare Other | Source: Ambulatory Visit | Attending: Family | Admitting: Family

## 2013-06-29 ENCOUNTER — Other Ambulatory Visit (HOSPITAL_COMMUNITY): Payer: Medicare Other

## 2013-06-29 DIAGNOSIS — I6529 Occlusion and stenosis of unspecified carotid artery: Secondary | ICD-10-CM | POA: Insufficient documentation

## 2013-06-29 DIAGNOSIS — Z48812 Encounter for surgical aftercare following surgery on the circulatory system: Secondary | ICD-10-CM | POA: Insufficient documentation

## 2013-06-29 NOTE — Patient Instructions (Signed)
Stroke Prevention Some medical conditions and behaviors are associated with an increased chance of having a stroke. You may prevent a stroke by making healthy choices and managing medical conditions. Reduce your risk of having a stroke by:  Staying physically active. Get at least 30 minutes of activity on most or all days.  Not smoking. It may also be helpful to avoid exposure to secondhand smoke.  Limiting alcohol use. Moderate alcohol use is considered to be:  No more than 2 drinks per day for men.  No more than 1 drink per day for nonpregnant women.  Eating healthy foods.  Include 5 or more servings of fruits and vegetables a day.  Certain diets may be prescribed to address high blood pressure, high cholesterol, diabetes, or obesity.  Managing your cholesterol levels.  A low-saturated fat, low-trans fat, low-cholesterol, and high-fiber diet may control cholesterol levels.  Take any prescribed medicines to control cholesterol as directed by your caregiver.  Managing your diabetes.  A controlled-carbohydrate, controlled-sugar diet is recommended to manage diabetes.  Take any prescribed medicines to control diabetes as directed by your caregiver.  Controlling your high blood pressure (hypertension).  A low-salt (sodium), low-saturated fat, low-trans fat, and low-cholesterol diet is recommended to manage high blood pressure.  Take any prescribed medicines to control hypertension as directed by your caregiver.  Maintaining a healthy weight.  A reduced-calorie, low-sodium, low-saturated fat, low-trans fat, low-cholesterol diet is recommended to manage weight.  Stopping drug abuse.  Avoiding birth control pills.  Talk to your caregiver about the risks of taking birth control pills if you are over 35 years old, smoke, get migraines, or have ever had a blood clot.  Getting evaluated for sleep disorders (sleep apnea).  Talk to your caregiver about getting a sleep evaluation  if you snore a lot or have excessive sleepiness.  Taking medicines as directed by your caregiver.  For some people, aspirin or blood thinners (anticoagulants) are helpful in reducing the risk of forming abnormal blood clots that can lead to stroke. If you have the irregular heart rhythm of atrial fibrillation, you should be on a blood thinner unless there is a good reason you cannot take them.  Understand all your medicine instructions. SEEK IMMEDIATE MEDICAL CARE IF:   You have sudden weakness or numbness of the face, arm, or leg, especially on one side of the body.  You have sudden confusion.  You have trouble speaking (aphasia) or understanding.  You have sudden trouble seeing in one or both eyes.  You have sudden trouble walking.  You have dizziness.  You have a loss of balance or coordination.  You have a sudden, severe headache with no known cause.  You have new chest pain or an irregular heartbeat. Any of these symptoms may represent a serious problem that is an emergency. Do not wait to see if the symptoms will go away. Get medical help right away. Call your local emergency services (911 in [place] Do not drive yourself to the hospital. Document Released: 08/13/2004 Document Revised: 09/28/2011 Document Reviewed: 01/06/2013 [employer] Patient Information 2014 [employer].  

## 2013-06-29 NOTE — Progress Notes (Signed)
Established Carotid Patient  History of Present Illness  Jonathan York is a 79 y.o. male patient of Dr. Darrick Penna who present for followup after left carotid endarterectomy November 2013.  Patient has Negative history of TIA or stroke symptom.  The patient denies amaurosis fugax or monocular blindness.  The patient  denies facial drooping.  Pt. denies hemiplegia.  The patient denies receptive or expressive aphasia.  Pt. denies extremity weakness. He denies steal symptoms, denies claudication symptoms, denies non-healing wounds.  Patient denies New Medical or Surgical History.  Pt Diabetic: Yes, borderline, diet controlled Pt smoker: former smoker, quit in 1984  Patient states his blood pressure is usually 130-135/80-85, states he has white coat syndrome.  Pt meds include: Statin : Yes ASA: Yes, daily 325 mg Other anticoagulants/antiplatelets: no   Past Medical History  Diagnosis Date  . HLD (hyperlipidemia)   . HTN (hypertension)   . BPH (benign prostatic hypertrophy)   . Cancer     lung cancer dx'd 2002  . Abdominal bruit 05/05/2012  . Atrial fibrillation   . Diabetes mellitus type II     diet controlled   . Arthritis     back pain, past lumbar surgery   . Carotid arterial disease     Social History History  Substance Use Topics  . Smoking status: Former Smoker -- 1.00 packs/day for 32 years    Types: Cigarettes    Quit date: 09/03/1982  . Smokeless tobacco: Never Used  . Alcohol Use: No     Comment: quit 2003 w exception of wine in winter (a little)     Family History Family History  Problem Relation Age of Onset  . Emphysema Father   . Dementia Mother   . Diabetes Mother   . Hypertension Mother   . Hypothyroidism Sister   . Prostate cancer Neg Hx   . Colon cancer Neg Hx   . Dementia Brother   . Hypertension Brother   . Diabetes Son   . Heart disease Son     Heart Disease before age 9- Open Heart 2010  . Hypertension Son     Surgical History Past  Surgical History  Procedure Laterality Date  . Hemorrhoid surgery  1970  . Cataract extraction      right  . Retinal detachment surgery      right  . Wedge resection  03/15/01    [facility] Dorris Fetch)  . Lobectomy  03/15/01    [facility] Dorris Fetch)  . Spine surgery  approx 2006    Ruptered Disc  . Total knee arthroplasty      x2 per Dr. Rayburn Ma  . Eye surgery      Jonathan detached retina- 1990's, IOL implants also   . Tonsillectomy      1939  . Endarterectomy  05/30/2012    Procedure: ENDARTERECTOMY CAROTID;  Surgeon: Sherren Kerns, MD;  Location: [facility];  Service: Vascular;  Laterality: Left;  . Cardioversion  06/29/2012    Procedure: CARDIOVERSION;  Surgeon: Vesta Mixer, MD;  Location: [facility] ENDOSCOPY;  Service: Cardiovascular;  Laterality: N/A;  . Carotid endarterectomy Left 05/30/12  . Joint replacement      knee- bilateral- 2009 and 2012    No Known Allergies  Current Outpatient Prescriptions  Medication Sig Dispense Refill  . amLODipine (NORVASC) 5 MG tablet Take 1 tablet (5 mg total) by mouth at bedtime.  45 tablet  6  . atorvastatin (LIPITOR) 80 MG tablet Take 1 tablet (80 mg total) by  mouth at bedtime.  90 tablet  3  . cetirizine (ZYRTEC) 10 MG tablet Take 10 mg by mouth daily as needed. Uses rarely for allergies      . Cinnamon 500 MG capsule Take 500 mg by mouth daily.      . clobetasol (TEMOVATE) 0.05 % ointment Apply 1 application topically 2 (two) times daily as needed. Uses about 2x/yr for rash      . fish oil-omega-3 fatty acids 1000 MG capsule Take 1 g by mouth 3 (three) times daily.        . Multiple Vitamins-Minerals (CENTRUM) tablet Take 1 tablet by mouth daily.        Marland Kitchen oxyCODONE-acetaminophen (PERCOCET/ROXICET) 5-325 MG per tablet Take 1 tablet by mouth every 4 (four) hours as needed for pain. Uses about 4x per year.      . psyllium (REGULOID) 0.52 G capsule Take 0.52 g by mouth daily.      . ramipril (ALTACE) 5 MG capsule Take 1 capsule (5 mg total) by mouth daily.   90 capsule  3  . tamsulosin (FLOMAX) 0.4 MG CAPS Take 1 capsule (0.4 mg total) by mouth daily.  30 capsule  6   No current facility-administered medications for this visit.    Physical Examination  Filed Vitals:   06/29/13 1233  BP: 160/83  Pulse: 60  Resp: 16   Filed Weights   06/29/13 1233  Weight: 242 lb (109.77 kg)   Body mass index is 29.47 kg/(m^2).   General: WDWN male in NAD GAIT: normal Eyes: PERRLA Pulmonary:  CTAB, Negative  Rales, Negative rhonchi, & Negative wheezing.  Cardiac: regular Rhythm ,  Negative Murmurs. Carotid pulses are palpable bilaterally.  VASCULAR EXAM Carotid Bruits Left Right   Negative Negative     Radial pulses are 3+ palpable and equal.                                                                                                                            LE Pulses LEFT RIGHT       POPLITEAL  not palpable   not palpable    Gastrointestinal: soft, nontender, BS WNL, no Jonathan/g,  negative masses.  Musculoskeletal: Negative muscle atrophy/wasting. M/S 5/5 throughout, Extremities without ischemic changes. 2+ bilateral pretibial pitting edema.  Neurologic: A&O X 3; Appropriate Affect ; SENSATION ;normal;  Speech is normal CN 2-12 intact, Pain and light touch intact in extremities, Motor exam as listed above.   Non-Invasive Vascular Imaging CAROTID DUPLEX 06/29/2013   Right ICA: 40 - 59 % stenosis. Left ICA: patent CEA site. Increased velocities in the right ICA compared to a year ago, but remains in the 40-59% stenosis range. Right CCA disease greater than 50% at the distal segment with a ratio of 2.5.  These findings are Unchanged from previous exam.  Assessment: Jonathan York is a 79 y.o. male who presents with asymptomatic 40 - 59 % Right ICA  Stenosis and  patent left ICA which is the CEA site. Increased velocities in the right ICA compared to a year ago, but remains in the 40-59% stenosis range. Right CCA disease greater than  50% at the distal segment with a ratio of 2.5.  The  ICA stenosis is  Unchanged from previous exam.  Plan: Based on today's exam and Duplex results, and after discussing with Dr. Darrick Penna, patient advised to follow-up in 1 year with Carotid Duplex scan.   I discussed in depth with the patient the nature of atherosclerosis, and emphasized the importance of maximal medical management including strict control of blood pressure, blood glucose, and lipid levels, obtaining regular exercise, and continued cessation of smoking.  The patient is aware that without maximal medical management the underlying atherosclerotic disease process will progress, limiting the benefit of any interventions. The patient was given information about stroke prevention and what symptoms should prompt the patient to seek immediate medical care. Thank you for allowing [place] to participate in this patient's care.  Charisse March, RN, MSN, FNP-C [facility] Office: [phone]  Clinic Physician: Darrick Penna  06/29/2013 1:04 PM

## 2013-07-06 ENCOUNTER — Encounter: Payer: Self-pay | Admitting: Cardiology

## 2013-07-06 ENCOUNTER — Ambulatory Visit (INDEPENDENT_AMBULATORY_CARE_PROVIDER_SITE_OTHER): Payer: Medicare Other | Admitting: Cardiology

## 2013-07-06 VITALS — BP 130/78 | HR 76 | Ht 76.0 in | Wt 238.8 lb

## 2013-07-06 DIAGNOSIS — I10 Essential (primary) hypertension: Secondary | ICD-10-CM

## 2013-07-06 DIAGNOSIS — I4891 Unspecified atrial fibrillation: Secondary | ICD-10-CM

## 2013-07-06 DIAGNOSIS — I441 Atrioventricular block, second degree: Secondary | ICD-10-CM

## 2013-07-06 NOTE — Progress Notes (Signed)
Jonathan York Date of Birth: August 28, 1932 Medical Record #[id number]  History of Present Illness: Jonathan York is seen for followup of atrial fibrillation. He underwent successful DC cardioversion on December 11,2013. He denies any recurrent palpitations.  His symptoms of dizziness have resolved. His energy level is good.He had recent followup carotid Dopplers which showed that the left ICA was widely patent. There is 40-60% stenosis on the right. His BP at home has been controlled at 130-135/80-85.  Current Outpatient Prescriptions on File Prior to Visit  Medication Sig Dispense Refill  . amLODipine (NORVASC) 5 MG tablet Take 1 tablet (5 mg total) by mouth at bedtime.  45 tablet  6  . atorvastatin (LIPITOR) 80 MG tablet Take 1 tablet (80 mg total) by mouth at bedtime.  90 tablet  3  . cetirizine (ZYRTEC) 10 MG tablet Take 10 mg by mouth daily as needed. Uses rarely for allergies      . Cinnamon 500 MG capsule Take 500 mg by mouth daily.      . clobetasol (TEMOVATE) 0.05 % ointment Apply 1 application topically 2 (two) times daily as needed. Uses about 2x/yr for rash      . fish oil-omega-3 fatty acids 1000 MG capsule Take 1 g by mouth 3 (three) times daily.        . Multiple Vitamins-Minerals (CENTRUM) tablet Take 1 tablet by mouth daily.        . psyllium (REGULOID) 0.52 G capsule Take 0.52 g by mouth daily.      . ramipril (ALTACE) 5 MG capsule Take 1 capsule (5 mg total) by mouth daily.  90 capsule  3  . tamsulosin (FLOMAX) 0.4 MG CAPS Take 1 capsule (0.4 mg total) by mouth daily.  30 capsule  6   No current facility-administered medications on file prior to visit.    No Known Allergies  Past Medical History  Diagnosis Date  . HLD (hyperlipidemia)   . HTN (hypertension)   . BPH (benign prostatic hypertrophy)   . Cancer     lung cancer dx'd 2002  . Abdominal bruit 05/05/2012  . Atrial fibrillation   . Diabetes mellitus type II     diet controlled   . Arthritis     back pain, past  lumbar surgery   . Carotid arterial disease     Past Surgical History  Procedure Laterality Date  . Hemorrhoid surgery  1970  . Cataract extraction      right  . Retinal detachment surgery      right  . Wedge resection  03/15/01    [facility] Jonathan York)  . Lobectomy  03/15/01    [facility] Jonathan York)  . Spine surgery  approx 2006    Jonathan York  . Total knee arthroplasty      x2 per Dr. Rayburn York  . Eye surgery      Jonathan detached retina- 1990's, IOL implants also   . Tonsillectomy      1939  . Endarterectomy  05/30/2012    Procedure: ENDARTERECTOMY CAROTID;  Surgeon: Jonathan Kerns, MD;  Location: [facility] OR;  Service: Vascular;  Laterality: Left;  . Cardioversion  06/29/2012    Procedure: CARDIOVERSION;  Surgeon: Jonathan Mixer, MD;  Location: [facility] ENDOSCOPY;  Service: Cardiovascular;  Laterality: N/A;  . Carotid endarterectomy Left 05/30/12  . Joint replacement      knee- bilateral- 2009 and 2012    History  Smoking status  . Former Smoker -- 1.00 packs/day for 32 years  .  Types: Cigarettes  . Quit date: 09/03/1982  Smokeless tobacco  . Never Used    History  Alcohol Use No    Comment: quit 2003 w exception of wine in winter (a little)     Family History  Problem Relation Age of Onset  . Emphysema Father   . Dementia Mother   . Diabetes Mother   . Hypertension Mother   . Hypothyroidism Sister   . Prostate cancer Neg Hx   . Colon cancer Neg Hx   . Dementia Brother   . Hypertension Brother   . Diabetes Son   . Heart disease Son     Heart Disease before age 60- Open Heart 2010  . Hypertension Son     Review of Systems: The review of systems is as noted in history of present illness.  All other systems were reviewed and are negative.  Physical Exam: BP 130/78  Pulse 76  Ht 6\' 4"  (1.93 m)  Wt 238 lb 12 oz (108.296 kg)  BMI 29.07 kg/m2 He is a pleasant white male in no acute distress.The patient is alert and oriented x 3.  The HEENT exam is unremarkable. The  carotids are 2+ with a left carotid endarterectomy scar. There is no thyromegaly.  There is no JVD.  The lungs are clear.   The heart exam reveals a regular rate with a normal S1 and S2.  There are no murmurs, gallops, or rubs.  The PMI is not displaced.   Abdominal exam reveals good bowel sounds.      Exam of the legs reveal no clubbing, cyanosis, or edema.  The legs are without rashes.  The distal pulses are intact.  Cranial nerves II - XII are intact.  Motor and sensory functions are intact.  The gait is normal.   LABORATORY DATA:   Assessment / Plan: 1. Atrial fibrillation. Status post successful cardioversion in December 2013. He is maintaining sinus rhythm. Continue to monitor for recurrent symptoms. With [place] score of 3 if he did have recurrent Afib he would need long term anticoagulant therapy.  2. Postural dizziness. Significantly improved with reduction in his antihypertensive therapy. Blood pressure remains well-controlled  3. Hypertension. Controlled.  4. Hyperlipidemia.  5. Diabetes mellitus type 2.  6. History of lung CA status post resection and chemotherapy in 2002.  7. Status post left carotid endarterectomy.

## 2013-07-06 NOTE — Patient Instructions (Signed)
Continue your current therapy  I will see you in one year   

## 2013-07-29 ENCOUNTER — Other Ambulatory Visit: Payer: Self-pay | Admitting: Family Medicine

## 2013-07-31 NOTE — Telephone Encounter (Signed)
Electronic refill request.  Please advise. 

## 2013-08-10 ENCOUNTER — Ambulatory Visit (INDEPENDENT_AMBULATORY_CARE_PROVIDER_SITE_OTHER): Payer: Medicare Other | Admitting: Ophthalmology

## 2013-08-10 DIAGNOSIS — H43819 Vitreous degeneration, unspecified eye: Secondary | ICD-10-CM

## 2013-08-10 DIAGNOSIS — E1165 Type 2 diabetes mellitus with hyperglycemia: Secondary | ICD-10-CM

## 2013-08-10 DIAGNOSIS — H33009 Unspecified retinal detachment with retinal break, unspecified eye: Secondary | ICD-10-CM

## 2013-08-10 DIAGNOSIS — E1139 Type 2 diabetes mellitus with other diabetic ophthalmic complication: Secondary | ICD-10-CM

## 2013-08-10 DIAGNOSIS — H3530 Unspecified macular degeneration: Secondary | ICD-10-CM

## 2013-08-10 DIAGNOSIS — I10 Essential (primary) hypertension: Secondary | ICD-10-CM

## 2013-08-10 DIAGNOSIS — E11319 Type 2 diabetes mellitus with unspecified diabetic retinopathy without macular edema: Secondary | ICD-10-CM

## 2013-08-10 DIAGNOSIS — H35039 Hypertensive retinopathy, unspecified eye: Secondary | ICD-10-CM

## 2013-10-27 ENCOUNTER — Other Ambulatory Visit: Payer: Self-pay | Admitting: Cardiology

## 2013-12-10 ENCOUNTER — Other Ambulatory Visit: Payer: Self-pay | Admitting: Family Medicine

## 2013-12-12 NOTE — Telephone Encounter (Signed)
Last office visit 7/14--please advise if okay to refill

## 2013-12-13 NOTE — Telephone Encounter (Signed)
CPE pending.  Sent.  thanks

## 2014-01-14 ENCOUNTER — Other Ambulatory Visit: Payer: Self-pay | Admitting: Family Medicine

## 2014-01-14 DIAGNOSIS — E119 Type 2 diabetes mellitus without complications: Secondary | ICD-10-CM

## 2014-01-14 DIAGNOSIS — C3490 Malignant neoplasm of unspecified part of unspecified bronchus or lung: Secondary | ICD-10-CM

## 2014-01-18 ENCOUNTER — Other Ambulatory Visit: Payer: Medicare Other

## 2014-01-22 ENCOUNTER — Other Ambulatory Visit: Payer: Self-pay | Admitting: Family Medicine

## 2014-01-22 ENCOUNTER — Ambulatory Visit (INDEPENDENT_AMBULATORY_CARE_PROVIDER_SITE_OTHER)
Admission: RE | Admit: 2014-01-22 | Discharge: 2014-01-22 | Disposition: A | Discharge status: Home / Self Care | Payer: Medicare Other | Source: Ambulatory Visit | Attending: Family Medicine | Admitting: Family Medicine

## 2014-01-22 ENCOUNTER — Other Ambulatory Visit (INDEPENDENT_AMBULATORY_CARE_PROVIDER_SITE_OTHER): Payer: Medicare Other

## 2014-01-22 DIAGNOSIS — E119 Type 2 diabetes mellitus without complications: Secondary | ICD-10-CM

## 2014-01-22 DIAGNOSIS — E785 Hyperlipidemia, unspecified: Secondary | ICD-10-CM

## 2014-01-22 DIAGNOSIS — N400 Enlarged prostate without lower urinary tract symptoms: Secondary | ICD-10-CM

## 2014-01-22 DIAGNOSIS — I10 Essential (primary) hypertension: Secondary | ICD-10-CM

## 2014-01-22 DIAGNOSIS — C3490 Malignant neoplasm of unspecified part of unspecified bronchus or lung: Secondary | ICD-10-CM

## 2014-01-22 LAB — CBC WITH DIFFERENTIAL/PLATELET
Basophils Absolute: 0 10*3/uL (ref 0.0–0.1)
Basophils Relative: 0.5 % (ref 0.0–3.0)
EOS PCT: 1.7 % (ref 0.0–5.0)
Eosinophils Absolute: 0.1 10*3/uL (ref 0.0–0.7)
HEMATOCRIT: 45.7 % (ref 39.0–52.0)
Hemoglobin: 14.9 g/dL (ref 13.0–17.0)
LYMPHS ABS: 2.2 10*3/uL (ref 0.7–4.0)
LYMPHS PCT: 27.7 % (ref 12.0–46.0)
MCHC: 32.6 g/dL (ref 30.0–36.0)
MCV: 88.6 fl (ref 78.0–100.0)
Monocytes Absolute: 0.7 10*3/uL (ref 0.1–1.0)
Monocytes Relative: 8.6 % (ref 3.0–12.0)
Neutro Abs: 4.8 10*3/uL (ref 1.4–7.7)
Neutrophils Relative %: 61.5 % (ref 43.0–77.0)
PLATELETS: 216 10*3/uL (ref 150.0–400.0)
RBC: 5.16 Mil/uL (ref 4.22–5.81)
RDW: 14.5 % (ref 11.5–15.5)
WBC: 7.8 10*3/uL (ref 4.0–10.5)

## 2014-01-22 LAB — COMPREHENSIVE METABOLIC PANEL
ALBUMIN: 4 g/dL (ref 3.5–5.2)
ALT: 39 U/L (ref 0–53)
AST: 28 U/L (ref 0–37)
Alkaline Phosphatase: 52 U/L (ref 39–117)
BILIRUBIN TOTAL: 0.9 mg/dL (ref 0.2–1.2)
BUN: 14 mg/dL (ref 6–23)
CALCIUM: 9.2 mg/dL (ref 8.4–10.5)
CHLORIDE: 104 meq/L (ref 96–112)
CO2: 27 mEq/L (ref 19–32)
Creatinine, Ser: 0.8 mg/dL (ref 0.4–1.5)
GFR: 93.77 mL/min (ref >60.00)
GLUCOSE: 138 mg/dL — AB (ref 70–99)
POTASSIUM: 4.2 meq/L (ref 3.5–5.1)
Sodium: 138 mEq/L (ref 135–145)
TOTAL PROTEIN: 7.1 g/dL (ref 6.0–8.3)

## 2014-01-22 LAB — LIPID PANEL
CHOLESTEROL: 169 mg/dL (ref 0–200)
HDL: 50.4 mg/dL (ref >39.00)
LDL CALC: 94 mg/dL (ref 0–99)
NonHDL: 118.6
Total CHOL/HDL Ratio: 3
Triglycerides: 121 mg/dL (ref 0.0–149.0)
VLDL: 24.2 mg/dL (ref 0.0–40.0)

## 2014-01-22 LAB — HEMOGLOBIN A1C: HEMOGLOBIN A1C: 7.2 % — AB (ref 4.6–6.5)

## 2014-01-23 ENCOUNTER — Telehealth: Payer: Self-pay | Admitting: Family Medicine

## 2014-01-23 NOTE — Telephone Encounter (Signed)
Relevant patient education mailed to patient.  

## 2014-01-25 ENCOUNTER — Ambulatory Visit (INDEPENDENT_AMBULATORY_CARE_PROVIDER_SITE_OTHER): Payer: Medicare Other | Admitting: Family Medicine

## 2014-01-25 ENCOUNTER — Encounter: Payer: Self-pay | Admitting: Family Medicine

## 2014-01-25 VITALS — BP 146/80 | HR 87 | Temp 98.0°F | Ht 75.5 in | Wt 240.2 lb

## 2014-01-25 DIAGNOSIS — I10 Essential (primary) hypertension: Secondary | ICD-10-CM

## 2014-01-25 DIAGNOSIS — E785 Hyperlipidemia, unspecified: Secondary | ICD-10-CM

## 2014-01-25 DIAGNOSIS — E119 Type 2 diabetes mellitus without complications: Secondary | ICD-10-CM

## 2014-01-25 DIAGNOSIS — Z7189 Other specified counseling: Secondary | ICD-10-CM | POA: Insufficient documentation

## 2014-01-25 DIAGNOSIS — L821 Other seborrheic keratosis: Secondary | ICD-10-CM | POA: Insufficient documentation

## 2014-01-25 DIAGNOSIS — Z0000 Encounter for general adult medical examination without abnormal findings: Secondary | ICD-10-CM

## 2014-01-25 MED ORDER — RAMIPRIL 5 MG PO CAPS: ORAL_CAPSULE | ORAL | Status: DC

## 2014-01-25 MED ORDER — TAMSULOSIN HCL 0.4 MG PO CAPS: ORAL_CAPSULE | ORAL | Status: DC

## 2014-01-25 MED ORDER — AMLODIPINE BESYLATE 5 MG PO TABS: ORAL_TABLET | ORAL | Status: DC

## 2014-01-25 NOTE — Assessment & Plan Note (Signed)
Reasonable A1c, no meds. He'll work on diet and weight. Labs d/w pt.

## 2014-01-25 NOTE — Assessment & Plan Note (Signed)
Unremarkable CXR, d/w pt.  Can check CXR yearly.

## 2014-01-25 NOTE — Assessment & Plan Note (Signed)
Nearly controlled, continue as is.  Work on diet and weight, needs that and not a med change.  He agrees.

## 2014-01-25 NOTE — Assessment & Plan Note (Signed)
Return for intervention.

## 2014-01-25 NOTE — Progress Notes (Signed)
Pre visit review using our clinic review tool, if applicable. No additional management support is needed unless otherwise documented below in the visit note.  I have personally reviewed the Medicare Annual Wellness questionnaire and have noted 1. The patient's medical and social history 2. Their use of alcohol, tobacco or illicit drugs 3. Their current medications and supplements 4. The patient's functional ability including ADL's, fall risks, home safety risks and hearing or visual             impairment. 5. Diet and physical activities 6. Evidence for depression or mood disorders  The patients weight, height, BMI have been recorded in the chart and visual acuity is per eye clinic.  I have made referrals, counseling and provided education to the patient based review of the above and I have provided the pt with a written personalized care plan for preventive services.  Provider list updated- see scanned forms.  Routine anticipatory guidance given to patient.  See health maintenance.  Flu 2014 Shingles 2011 PNA 2009 Tetanus 2004- he'll check on coverage to update this Colon cancer screening up to date.  Prostate cancer screening and PSA options (with potential risks and benefits of testing vs not testing) were discussed along with recent recs/guidelines.  He declined testing PSA at this point. Advance directive- wife designated if patient were incapacitated.   Cognitive function addressed- see scanned forms- and if abnormal then additional documentation follows.   Hypertension:    Using medication without problems or lightheadedness: yes Chest pain with exertion:no Edema:no Short of breath: only at extremes of exertion.   Elevated Cholesterol: Using medications without problems:yes Muscle aches: no Diet compliance:yes Exercise:yes  Diabetes:  Using medications without difficulties:no meds.  Hypoglycemic episodes:no sx Hyperglycemic episodes: no sx Feet problems:no Blood  Sugars averaging: not checked.  eye exam within last year:due for f/u soon.   H/o lung cancer with unremarkable f/u CXR noted.  Reviewed with patient.   PMH and SH reviewed  Meds, vitals, and allergies reviewed.   ROS: See HPI.  Otherwise negative.    GEN: nad, alert and oriented HEENT: mucous membranes moist NECK: supple w/o LA CV: rrr. PULM: ctab, no inc wob ABD: soft, +bs EXT: no edema SKIN: no acute rash, but SKs noted on trunk.

## 2014-01-25 NOTE — Assessment & Plan Note (Signed)
Controlled, continue as is.  Work on diet and weight.  He agrees.

## 2014-01-25 NOTE — Assessment & Plan Note (Signed)
Flu 2014 Shingles 2011 PNA 2009 Tetanus 2004- he'll check on coverage to update this Colon cancer screening up to date.  Prostate cancer screening and PSA options (with potential risks and benefits of testing vs not testing) were discussed along with recent recs/guidelines.  He declined testing PSA at this point. Advance directive- wife designated if patient were incapacitated.   Cognitive function addressed- see scanned forms- and if abnormal then additional documentation follows.

## 2014-01-25 NOTE — Patient Instructions (Addendum)
Ask for a 89min appointment for me to cut the SK off your back. Check to see about coverage for the tetanus shot- it may be cheaper at the health department.  Recheck in about 1 year for a physical.  Take care. Glad to see you.  I would get a flu shot each fall.

## 2014-01-26 ENCOUNTER — Ambulatory Visit (INDEPENDENT_AMBULATORY_CARE_PROVIDER_SITE_OTHER): Payer: Medicare Other | Admitting: Family Medicine

## 2014-01-26 ENCOUNTER — Encounter: Payer: Self-pay | Admitting: Family Medicine

## 2014-01-26 VITALS — BP 130/80 | HR 67 | Temp 98.0°F | Wt 240.2 lb

## 2014-01-26 DIAGNOSIS — L821 Other seborrheic keratosis: Secondary | ICD-10-CM

## 2014-01-26 NOTE — Patient Instructions (Signed)
Keep the area clean and covered and we'll notify you with the pathology report.  Notify us if you have redness or drainage.  If you have any oozing blood, then put pressure on the area and change the bandaid.  Glad to see you. Take care.

## 2014-01-26 NOTE — Progress Notes (Signed)
Pre visit review using our clinic review tool, if applicable. No additional management support is needed unless otherwise documented below in the visit note.  Shave biopsy  Meds, vitals, and allergies reviewed.   Indication: likely irritated SK  Location: L back  Size: ~1.5cm  Informed consent obtained.  Pt aware of risks not limited to but including infection, bleeding, damage to near by organs.  Prep: etoh/betadine  Anesthesia: 2%lidocaine with epi, good effect  Shave made with dermablade  Minimal oozing, controlled with silver nitrate  Tolerated well  Routine postprocedure instructions d/w pt- keep area clean and bandaged, follow up if concerns/spreading erythema/pain.

## 2014-01-28 NOTE — Assessment & Plan Note (Addendum)
Likely irritated SK.  Shave bx tolerated well.  Routine postprocedure instructions d/w pt- keep area clean and bandaged, follow up if concerns/spreading erythema/pain. No complications.  Pathology pending.

## 2014-03-05 ENCOUNTER — Other Ambulatory Visit: Payer: Self-pay | Admitting: Family Medicine

## 2014-04-05 ENCOUNTER — Ambulatory Visit (INDEPENDENT_AMBULATORY_CARE_PROVIDER_SITE_OTHER): Payer: Medicare Other

## 2014-04-05 DIAGNOSIS — Z23 Encounter for immunization: Secondary | ICD-10-CM

## 2014-06-28 ENCOUNTER — Encounter (HOSPITAL_COMMUNITY): Payer: Self-pay | Admitting: Vascular Surgery

## 2014-07-04 ENCOUNTER — Encounter: Payer: Self-pay | Admitting: Family

## 2014-07-05 ENCOUNTER — Encounter (INDEPENDENT_AMBULATORY_CARE_PROVIDER_SITE_OTHER): Payer: Medicare Other | Admitting: Family

## 2014-07-05 ENCOUNTER — Ambulatory Visit (HOSPITAL_COMMUNITY)
Admission: RE | Admit: 2014-07-05 | Discharge: 2014-07-05 | Disposition: A | Discharge status: Home / Self Care | Payer: Medicare Other | Source: Ambulatory Visit | Attending: Family | Admitting: Family

## 2014-07-05 DIAGNOSIS — Z48812 Encounter for surgical aftercare following surgery on the circulatory system: Secondary | ICD-10-CM | POA: Insufficient documentation

## 2014-07-05 DIAGNOSIS — I6523 Occlusion and stenosis of bilateral carotid arteries: Secondary | ICD-10-CM | POA: Diagnosis not present

## 2014-07-06 ENCOUNTER — Encounter: Payer: Self-pay | Admitting: Vascular Surgery

## 2014-07-06 NOTE — Patient Instructions (Signed)
Dear [name],   Your recent Vascular Lab visit on July 05, 2014 indicates: NO significant change compared to prior exam 06-29-13. Please follow up in  One year.          Stroke Prevention Some medical conditions and behaviors are associated with an increased chance of having a stroke. You may prevent a stroke by making healthy choices and managing medical conditions. HOW CAN I REDUCE MY RISK OF HAVING A STROKE?   Stay physically active. Get at least 30 minutes of activity on most or all days.  Do not smoke. It may also be helpful to avoid exposure to secondhand smoke.  Limit alcohol use. Moderate alcohol use is considered to be:  No more than 2 drinks per day for men.  No more than 1 drink per day for nonpregnant women.  Eat healthy foods. This involves:  Eating 5 or more servings of fruits and vegetables a day.  Making dietary changes that address high blood pressure (hypertension), high cholesterol, diabetes, or obesity.  Manage your cholesterol levels.  Making food choices that are high in fiber and low in saturated fat, trans fat, and cholesterol may control cholesterol levels.  Take any prescribed medicines to control cholesterol as directed by your health care provider.  Manage your diabetes.  Controlling your carbohydrate and sugar intake is recommended to manage diabetes.  Take any prescribed medicines to control diabetes as directed by your health care provider.  Control your hypertension.  Making food choices that are low in salt (sodium), saturated fat, trans fat, and cholesterol is recommended to manage hypertension.  Take any prescribed medicines to control hypertension as directed by your health care provider.  Maintain a healthy weight.  Reducing calorie intake and making food choices that are low in sodium, saturated fat, trans fat, and cholesterol are recommended to manage weight.  Stop drug abuse.  Avoid taking birth control  pills.  Talk to your health care provider about the risks of taking birth control pills if you are over 69 years old, smoke, get migraines, or have ever had a blood clot.  Get evaluated for sleep disorders (sleep apnea).  Talk to your health care provider about getting a sleep evaluation if you snore a lot or have excessive sleepiness.  Take medicines only as directed by your health care provider.  For some people, aspirin or blood thinners (anticoagulants) are helpful in reducing the risk of forming abnormal blood clots that can lead to stroke. If you have the irregular heart rhythm of atrial fibrillation, you should be on a blood thinner unless there is a good reason you cannot take them.  Understand all your medicine instructions.  Make sure that other conditions (such as anemia or atherosclerosis) are addressed. SEEK IMMEDIATE MEDICAL CARE IF:   You have sudden weakness or numbness of the face, arm, or leg, especially on one side of the body.  Your face or eyelid droops to one side.  You have sudden confusion.  You have trouble speaking (aphasia) or understanding.  You have sudden trouble seeing in one or both eyes.  You have sudden trouble walking.  You have dizziness.  You have a loss of balance or coordination.  You have a sudden, severe headache with no known cause.  You have new chest pain or an irregular heartbeat. Any of these symptoms may represent a serious problem that is an emergency. Do not wait to see if the symptoms will go away. Get medical help at once.  Call your local emergency services (911 in [place] Do not drive yourself to the hospital. Document Released: 08/13/2004 Document Revised: 11/20/2013 Document Reviewed: 01/06/2013 [facility] Patient Information 2015 [place], [place]. This information is not intended to replace advice given to you by your health care provider. Make sure you discuss any questions you have with your health care provider.

## 2014-07-09 NOTE — Progress Notes (Signed)
Lab only 

## 2014-07-30 ENCOUNTER — Other Ambulatory Visit: Payer: Self-pay

## 2014-08-06 ENCOUNTER — Encounter: Payer: Self-pay | Admitting: Family Medicine

## 2014-08-13 ENCOUNTER — Ambulatory Visit (INDEPENDENT_AMBULATORY_CARE_PROVIDER_SITE_OTHER): Payer: Medicare Other | Admitting: Ophthalmology

## 2014-08-13 DIAGNOSIS — E11319 Type 2 diabetes mellitus with unspecified diabetic retinopathy without macular edema: Secondary | ICD-10-CM | POA: Diagnosis not present

## 2014-08-13 DIAGNOSIS — H3531 Nonexudative age-related macular degeneration: Secondary | ICD-10-CM

## 2014-08-13 DIAGNOSIS — H35033 Hypertensive retinopathy, bilateral: Secondary | ICD-10-CM

## 2014-08-13 DIAGNOSIS — E11329 Type 2 diabetes mellitus with mild nonproliferative diabetic retinopathy without macular edema: Secondary | ICD-10-CM | POA: Diagnosis not present

## 2014-08-13 DIAGNOSIS — I10 Essential (primary) hypertension: Secondary | ICD-10-CM | POA: Diagnosis not present

## 2014-08-13 DIAGNOSIS — H43813 Vitreous degeneration, bilateral: Secondary | ICD-10-CM

## 2014-08-13 DIAGNOSIS — H338 Other retinal detachments: Secondary | ICD-10-CM

## 2015-01-01 ENCOUNTER — Ambulatory Visit (INDEPENDENT_AMBULATORY_CARE_PROVIDER_SITE_OTHER): Payer: Self-pay | Admitting: Family Medicine

## 2015-01-01 ENCOUNTER — Encounter: Payer: Self-pay | Admitting: Family Medicine

## 2015-01-01 VITALS — BP 140/74 | HR 79 | Temp 98.0°F | Wt 240.5 lb

## 2015-01-01 DIAGNOSIS — G2581 Restless legs syndrome: Secondary | ICD-10-CM

## 2015-01-01 DIAGNOSIS — R300 Dysuria: Secondary | ICD-10-CM

## 2015-01-01 DIAGNOSIS — R399 Unspecified symptoms and signs involving the genitourinary system: Secondary | ICD-10-CM

## 2015-01-01 LAB — POCT URINALYSIS DIPSTICK
Bilirubin, UA: NEGATIVE
Blood, UA: NEGATIVE
Glucose, UA: NEGATIVE
KETONES UA: NEGATIVE
Leukocytes, UA: NEGATIVE
Nitrite, UA: NEGATIVE
Protein, UA: NEGATIVE
Urobilinogen, UA: 4
pH, UA: 6

## 2015-01-01 MED ORDER — METHOCARBAMOL 500 MG PO TABS: 500.0000 mg | ORAL_TABLET | Freq: Every evening | ORAL | Status: DC | PRN

## 2015-01-01 MED ORDER — TAMSULOSIN HCL 0.4 MG PO CAPS: 0.8000 mg | ORAL_CAPSULE | Freq: Every day | ORAL | Status: DC

## 2015-01-01 NOTE — Progress Notes (Signed)
Pre visit review using our clinic review tool, if applicable. No additional management support is needed unless otherwise documented below in the visit note.  RLS sx controlled with prn methocarbamol.  Needed refill.  No ADE on med.    Up at night to void x3 times per night.  On flomax at baseline.  He got the appointment then slept all night last night.  His stream is slow, esp at night.  Less frequency during the day, more at night.  He takes flomax in the evening.  No burning with urination.  No FCNVAD.  His urinary sx have gradually gotten worse in the last year, not acute.   Meds, vitals, and allergies reviewed.   ROS: See HPI.  Otherwise, noncontributory.  nad rrr ctab abd soft, not ttp Prostate gland firm and smooth, no enlargement, nodularity, tenderness, mass, asymmetry or induration.

## 2015-01-01 NOTE — Patient Instructions (Signed)
Try taking 2 flomax at a time and see if that helps.  Update me if needed.  Take care.

## 2015-01-02 DIAGNOSIS — R399 Unspecified symptoms and signs involving the genitourinary system: Secondary | ICD-10-CM | POA: Insufficient documentation

## 2015-01-02 NOTE — Assessment & Plan Note (Signed)
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  Will inc flomax to 0.'8mg'$  a day with routine cautions and he'll update me as needed.  D/w pt.  He agrees.

## 2015-01-02 NOTE — Assessment & Plan Note (Signed)
RLS sx controlled with prn methocarbamol.  Needed refill.  No ADE on med.  Continue as is, ie qhs prn.

## 2015-01-15 DIAGNOSIS — H5202 Hypermetropia, left eye: Secondary | ICD-10-CM | POA: Diagnosis not present

## 2015-01-15 DIAGNOSIS — H35341 Macular cyst, hole, or pseudohole, right eye: Secondary | ICD-10-CM | POA: Diagnosis not present

## 2015-01-15 LAB — HM DIABETES EYE EXAM

## 2015-01-27 ENCOUNTER — Other Ambulatory Visit: Payer: Self-pay | Admitting: Family Medicine

## 2015-01-27 DIAGNOSIS — C3490 Malignant neoplasm of unspecified part of unspecified bronchus or lung: Secondary | ICD-10-CM

## 2015-01-27 DIAGNOSIS — E119 Type 2 diabetes mellitus without complications: Secondary | ICD-10-CM

## 2015-02-01 ENCOUNTER — Other Ambulatory Visit: Payer: Self-pay

## 2015-02-04 ENCOUNTER — Other Ambulatory Visit: Payer: Self-pay

## 2015-02-11 ENCOUNTER — Encounter: Payer: Self-pay | Admitting: Family Medicine

## 2015-02-11 ENCOUNTER — Ambulatory Visit (INDEPENDENT_AMBULATORY_CARE_PROVIDER_SITE_OTHER): Payer: Medicare Other | Admitting: Family Medicine

## 2015-02-11 ENCOUNTER — Ambulatory Visit (INDEPENDENT_AMBULATORY_CARE_PROVIDER_SITE_OTHER)
Admission: RE | Admit: 2015-02-11 | Discharge: 2015-02-11 | Disposition: A | Discharge status: Home / Self Care | Payer: Medicare Other | Source: Ambulatory Visit | Attending: Family Medicine | Admitting: Family Medicine

## 2015-02-11 VITALS — BP 158/88 | HR 73 | Temp 97.8°F | Ht 74.5 in | Wt 235.8 lb

## 2015-02-11 DIAGNOSIS — C3490 Malignant neoplasm of unspecified part of unspecified bronchus or lung: Secondary | ICD-10-CM

## 2015-02-11 DIAGNOSIS — I10 Essential (primary) hypertension: Secondary | ICD-10-CM

## 2015-02-11 DIAGNOSIS — E785 Hyperlipidemia, unspecified: Secondary | ICD-10-CM | POA: Diagnosis not present

## 2015-02-11 DIAGNOSIS — I4891 Unspecified atrial fibrillation: Secondary | ICD-10-CM

## 2015-02-11 DIAGNOSIS — Z0000 Encounter for general adult medical examination without abnormal findings: Secondary | ICD-10-CM

## 2015-02-11 DIAGNOSIS — Z23 Encounter for immunization: Secondary | ICD-10-CM

## 2015-02-11 DIAGNOSIS — J449 Chronic obstructive pulmonary disease, unspecified: Secondary | ICD-10-CM | POA: Diagnosis not present

## 2015-02-11 DIAGNOSIS — E119 Type 2 diabetes mellitus without complications: Secondary | ICD-10-CM

## 2015-02-11 LAB — COMPREHENSIVE METABOLIC PANEL
ALBUMIN: 4.1 g/dL (ref 3.5–5.2)
ALT: 24 U/L (ref 0–53)
AST: 21 U/L (ref 0–37)
Alkaline Phosphatase: 50 U/L (ref 39–117)
BUN: 15 mg/dL (ref 6–23)
CO2: 29 mEq/L (ref 19–32)
Calcium: 9.4 mg/dL (ref 8.4–10.5)
Chloride: 102 mEq/L (ref 96–112)
Creatinine, Ser: 0.95 mg/dL (ref 0.40–1.50)
GFR: 81.14 mL/min (ref >60.00)
Glucose, Bld: 136 mg/dL — ABNORMAL HIGH (ref 70–99)
Potassium: 4.4 mEq/L (ref 3.5–5.1)
SODIUM: 137 meq/L (ref 135–145)
Total Bilirubin: 0.7 mg/dL (ref 0.2–1.2)
Total Protein: 6.7 g/dL (ref 6.0–8.3)

## 2015-02-11 LAB — LIPID PANEL
Cholesterol: 179 mg/dL (ref 0–200)
HDL: 48 mg/dL (ref >39.00)
LDL Cholesterol: 107 mg/dL — ABNORMAL HIGH (ref 0–99)
NonHDL: 131
Total CHOL/HDL Ratio: 4
Triglycerides: 119 mg/dL (ref 0.0–149.0)
VLDL: 23.8 mg/dL (ref 0.0–40.0)

## 2015-02-11 LAB — HEMOGLOBIN A1C: Hgb A1c MFr Bld: 7.2 % — ABNORMAL HIGH (ref 4.6–6.5)

## 2015-02-11 NOTE — Progress Notes (Signed)
Pre visit review using our clinic review tool, if applicable. No additional management support is needed unless otherwise documented below in the visit note.  I have personally reviewed the Medicare Annual Wellness questionnaire and have noted 1. The patient's medical and social history 2. Their use of alcohol, tobacco or illicit drugs 3. Their current medications and supplements 4. The patient's functional ability including ADL's, fall risks, home safety risks and hearing or visual             impairment. 5. Diet and physical activities 6. Evidence for depression or mood disorders  The patients weight, height, BMI have been recorded in the chart and visual acuity is per eye clinic.  I have made referrals, counseling and provided education to the patient based review of the above and I have provided the pt with a written personalized care plan for preventive services.  Provider list updated- see scanned forms.  Routine anticipatory guidance given to patient.  See health maintenance.  Flu 2015 Shingles 2011 PNA 2009 Tetanus 2004 Colon not due, d/w pt.  Prostate cancer screening and PSA options (with potential risks and benefits of testing vs not testing) were discussed along with recent recs/guidelines.  He declined testing PSA at this point. Advance directive- wife designated if patient were incapacitated.   Cognitive function addressed- see scanned forms- and if abnormal then additional documentation follows.   H/o lung cancer, no cough, no sputum.  Due for f/u CXR.  D/w pt.   Diabetes:  Using medications without difficulties: no meds Hypoglycemic episodes:no sx  Hyperglycemic episodes:no sx Feet problems: no Blood Sugars averaging: not checked.   eye exam within last year:yes  Hypertension:    Using medication without problems or lightheadedness: yes Chest pain with exertion:no Edema:no Short of breath:no  Elevated Cholesterol: Using medications without  problems:yes Muscle aches: no Diet compliance:yes Exercise: yes  AF.  No SX.  No CP.  Has f/u re: carotid study pending for 06/2015.  No bleeding on ASA.  At this point, as long NSR then ASA would be reasonable.    PMH and SH reviewed  Meds, vitals, and allergies reviewed.   ROS: See HPI.  Otherwise negative.    GEN: nad, alert and oriented HEENT: mucous membranes moist NECK: supple w/o LA CV: rrr. PULM: ctab, no inc wob ABD: soft, +bs EXT: no edema SKIN: no acute rash  Diabetic foot exam: Normal inspection No skin breakdown No calluses  Normal DP pulses Normal sensation to light touch and monofilament Nails normal except for R 1st nail is thickened- he is considering podiatry f/u.

## 2015-02-11 NOTE — Patient Instructions (Addendum)
Go to the lab on the way out.  We'll contact you with your lab and xray report. Check with your insurance to see if they will cover the tetanus shot. Take care.  Glad to see you.  Check your BP a few times at home.  If consistently >140/>90, then let me know.

## 2015-02-14 NOTE — Assessment & Plan Note (Signed)
Controlled on home checks.  Continue as is.  He agrees. See notes on labs.

## 2015-02-14 NOTE — Assessment & Plan Note (Signed)
His lipids are reasonable, given his hx and current meds.  Would continue as is, see notes on labs.  No ADE on med. Continue diet and exercise.

## 2015-02-14 NOTE — Assessment & Plan Note (Signed)
Flu 2015  Shingles 2011  PNA 2009  Tetanus 2004  Colon not due, d/w pt.  Prostate cancer screening and PSA options (with potential risks and benefits of testing vs not testing) were discussed along with recent recs/guidelines. He declined testing PSA at this point.  Advance directive- wife designated if patient were incapacitated.  Cognitive function addressed- see scanned forms- and if abnormal then additional documentation follows.

## 2015-02-14 NOTE — Assessment & Plan Note (Signed)
No Sx. No CP. Has f/u re: carotid study pending for 06/2015. No bleeding on ASA. At this point, as long NSR then ASA would be reasonable.

## 2015-02-14 NOTE — Assessment & Plan Note (Signed)
Recheck CXR unremarkable and no sx currently.  Continue as is.  He agrees. CXR reviewed.

## 2015-02-14 NOTE — Assessment & Plan Note (Signed)
Sill with DM2, but doesn't need meds.  See notes on labs re: A1c.  Can recheck periodically.  Continue work on Production designer, theatre/television/film.

## 2015-03-11 ENCOUNTER — Other Ambulatory Visit: Payer: Self-pay | Admitting: Family Medicine

## 2015-03-20 ENCOUNTER — Encounter: Payer: Self-pay | Admitting: Family Medicine

## 2015-03-20 ENCOUNTER — Ambulatory Visit (INDEPENDENT_AMBULATORY_CARE_PROVIDER_SITE_OTHER): Payer: Medicare Other | Admitting: Family Medicine

## 2015-03-20 VITALS — BP 150/90 | HR 72 | Temp 98.4°F | Wt 240.5 lb

## 2015-03-20 DIAGNOSIS — R21 Rash and other nonspecific skin eruption: Secondary | ICD-10-CM | POA: Diagnosis not present

## 2015-03-20 MED ORDER — TRIAMCINOLONE ACETONIDE 0.1 % EX CREA: 1.0000 "application " | TOPICAL_CREAM | Freq: Two times a day (BID) | CUTANEOUS | Status: DC

## 2015-03-20 NOTE — Patient Instructions (Signed)
Use cortisone 10 (OTC) 1-2 times a day.  If not better, then use triamcinolone 1-2 times a day.  This should heal.   Take care.  Glad to see you.

## 2015-03-20 NOTE — Assessment & Plan Note (Signed)
Likely nonspecific contact derm.  Not likely zoster.  Would use OTC hydrocortisone and then TAC if not better.  Doesn't look infected.  Should resolve, fu prn.  D/w pt, he agrees.

## 2015-03-20 NOTE — Progress Notes (Signed)
Pre visit review using our clinic review tool, if applicable. No additional management support is needed unless otherwise documented below in the visit note.  Lesions on R upper forehead.  Using neosporin on it w/o relief.  No clear cause.  Itchy.  No other rash.  Not painful.  Feels well o/w.  No change in sensation in the face.  No ear/nose/throat sx.   Meds, vitals, and allergies reviewed.   ROS: See HPI.  Otherwise, noncontributory.  nad ncat No rash except for blanching irritated rash on the upper forehead, midline.   No change in skin sensation and no other rash on R or L V1-3.

## 2015-04-02 ENCOUNTER — Other Ambulatory Visit: Payer: Self-pay | Admitting: Family Medicine

## 2015-04-02 NOTE — Telephone Encounter (Signed)
Please clarify, this was sent in 12/2014.  Thanks.

## 2015-04-02 NOTE — Telephone Encounter (Signed)
Ok to refill 

## 2015-04-03 NOTE — Telephone Encounter (Signed)
Attempted to call patient to see if refill on Flomax was needed. I was unable to reach patient. Line was beeping and would not let me leave a voicemail. Will try again later.

## 2015-04-09 NOTE — Telephone Encounter (Signed)
Contacted patient. He states that he currently does not need a refill on Flomax right now. Thanks!

## 2015-04-11 ENCOUNTER — Other Ambulatory Visit: Payer: Self-pay | Admitting: Family Medicine

## 2015-04-26 ENCOUNTER — Ambulatory Visit (INDEPENDENT_AMBULATORY_CARE_PROVIDER_SITE_OTHER): Payer: Medicare Other

## 2015-04-26 DIAGNOSIS — Z23 Encounter for immunization: Secondary | ICD-10-CM | POA: Diagnosis not present

## 2015-05-13 ENCOUNTER — Ambulatory Visit (INDEPENDENT_AMBULATORY_CARE_PROVIDER_SITE_OTHER): Payer: Medicare Other | Admitting: Primary Care

## 2015-05-13 ENCOUNTER — Encounter: Payer: Self-pay | Admitting: Primary Care

## 2015-05-13 VITALS — BP 152/92 | HR 70 | Temp 98.0°F | Ht 74.5 in | Wt 244.4 lb

## 2015-05-13 DIAGNOSIS — R05 Cough: Secondary | ICD-10-CM | POA: Diagnosis not present

## 2015-05-13 DIAGNOSIS — R059 Cough, unspecified: Secondary | ICD-10-CM

## 2015-05-13 MED ORDER — AMOXICILLIN-POT CLAVULANATE 875-125 MG PO TABS: 1.0000 | ORAL_TABLET | Freq: Two times a day (BID) | ORAL | Status: DC

## 2015-05-13 MED ORDER — BENZONATATE 200 MG PO CAPS: 200.0000 mg | ORAL_CAPSULE | Freq: Three times a day (TID) | ORAL | Status: DC | PRN

## 2015-05-13 NOTE — Patient Instructions (Signed)
Start Augmentin antibiotics for cough. Take 1 tablet by mouth twice daily for 7 days.  You may take the Benzonatate capsules three times daily as needed for cough.  You may take Mucinex DM for any chest congestion. Take this with a full glass of water.  Increase consumption of fluids and ensure you are resting.  It was a pleasure meeting you!  Acute Bronchitis Bronchitis is inflammation of the airways that extend from the windpipe into the lungs (bronchi). The inflammation often causes mucus to develop. This leads to a cough, which is the most common symptom of bronchitis.  In acute bronchitis, the condition usually develops suddenly and goes away over time, usually in a couple weeks. Smoking, allergies, and asthma can make bronchitis worse. Repeated episodes of bronchitis may cause further lung problems.  CAUSES Acute bronchitis is most often caused by the same virus that causes a cold. The virus can spread from person to person (contagious) through coughing, sneezing, and touching contaminated objects. SIGNS AND SYMPTOMS   Cough.   Fever.   Coughing up mucus.   Body aches.   Chest congestion.   Chills.   Shortness of breath.   Sore throat.  DIAGNOSIS  Acute bronchitis is usually diagnosed through a physical exam. Your health care provider will also ask you questions about your medical history. Tests, such as chest X-rays, are sometimes done to rule out other conditions.  TREATMENT  Acute bronchitis usually goes away in a couple weeks. Oftentimes, no medical treatment is necessary. Medicines are sometimes given for relief of fever or cough. Antibiotic medicines are usually not needed but may be prescribed in certain situations. In some cases, an inhaler may be recommended to help reduce shortness of breath and control the cough. A cool mist vaporizer may also be used to help thin bronchial secretions and make it easier to clear the chest.  HOME CARE INSTRUCTIONS  Get  plenty of rest.   Drink enough fluids to keep your urine clear or pale yellow (unless you have a medical condition that requires fluid restriction). Increasing fluids may help thin your respiratory secretions (sputum) and reduce chest congestion, and it will prevent dehydration.   Take medicines only as directed by your health care provider.  If you were prescribed an antibiotic medicine, finish it all even if you start to feel better.  Avoid smoking and secondhand smoke. Exposure to cigarette smoke or irritating chemicals will make bronchitis worse. If you are a smoker, consider using nicotine gum or skin patches to help control withdrawal symptoms. Quitting smoking will help your lungs heal faster.   Reduce the chances of another bout of acute bronchitis by washing your hands frequently, avoiding people with cold symptoms, and trying not to touch your hands to your mouth, nose, or eyes.   Keep all follow-up visits as directed by your health care provider.  SEEK MEDICAL CARE IF: Your symptoms do not improve after 1 week of treatment.  SEEK IMMEDIATE MEDICAL CARE IF:  You develop an increased fever or chills.   You have chest pain.   You have severe shortness of breath.  You have bloody sputum.   You develop dehydration.  You faint or repeatedly feel like you are going to pass out.  You develop repeated vomiting.  You develop a severe headache. MAKE SURE YOU:   Understand these instructions.  Will watch your condition.  Will get help right away if you are not doing well or get worse.   This  information is not intended to replace advice given to you by your health care provider. Make sure you discuss any questions you have with your health care provider.   Document Released: 08/13/2004 Document Revised: 07/27/2014 Document Reviewed: 12/27/2012 Elsevier Interactive Patient Education [employer].

## 2015-05-13 NOTE — Progress Notes (Signed)
Subjective:    Patient ID: Jonathan York, male    DOB: 12-23-1932, 81 y.o.   MRN: [id number]  HPI  Jonathan York is a 81 year old male who presents today with a chief complaint of cough. His cough is productive with light green sputum and has been present for 1 week. He also reports symptoms of sore throat. His cough became worse Saturday this past weekend. Denies fevers, chills, nasal congestion, shortness of breath. He's been taking [employer] Robitussin without any improvement.  Review of Systems  Constitutional: Negative for fever and chills.  Respiratory: Positive for cough. Negative for shortness of breath.   Cardiovascular: Negative for chest pain.       Past Medical History  Diagnosis Date  . HLD (hyperlipidemia)   . HTN (hypertension)   . BPH (benign prostatic hypertrophy)   . Cancer ([place])     lung cancer dx'd 2002  . Abdominal bruit 05/05/2012  . Atrial fibrillation ([place])   . Diabetes mellitus type II     diet controlled   . Arthritis     back pain, past lumbar surgery   . Carotid arterial disease ([place])     Social History   Social History  . Marital Status: Married    Spouse Name: N/A  . Number of Children: 3  . Years of Education: N/A   Occupational History  . Fired then [employer]    Social History Main Topics  . Smoking status: Former Smoker -- 1.00 packs/day for 32 years    Types: Cigarettes    Quit date: 09/03/1982  . Smokeless tobacco: Never Used  . Alcohol Use: No     Comment: quit 2003 w exception of wine in winter (a little)   . Drug Use: No  . Sexual Activity: Yes   Other Topics Concern  . Not on file   Social History Narrative   Retired Personal assistant   From [employer]   Married happily since 1964    Past Surgical History  Procedure Laterality Date  . Hemorrhoid surgery  1970  . Cataract extraction      right  . Retinal detachment surgery      right  . Wedge resection  03/15/01    [facility])  . Lobectomy   03/15/01    [facility])  . Spine surgery  approx 2006    Ruptered Disc  . Total knee arthroplasty      x2 per Dr. Rush Farmer  . Eye surgery      R detached retina- 1990's, IOL implants also   . Tonsillectomy      1939  . Endarterectomy  05/30/2012    Procedure: ENDARTERECTOMY CAROTID;  Surgeon: Elam Dutch, MD;  Location: [facility];  Service: Vascular;  Laterality: Left;  . Cardioversion  06/29/2012    Procedure: CARDIOVERSION;  Surgeon: Thayer Headings, MD;  Location: [facility] ENDOSCOPY;  Service: Cardiovascular;  Laterality: N/A;  . Carotid endarterectomy Left 05/30/12  . Joint replacement      knee- bilateral- 2009 and 2012  . Carotid angiogram N/A 05/27/2012    Procedure: CAROTID ANGIOGRAM;  Surgeon: Elam Dutch, MD;  Location: [facility] CATH LAB;  Service: Cardiovascular;  Laterality: N/A;  . Arch aortogram  05/27/2012    Procedure: ARCH AORTOGRAM;  Surgeon: Elam Dutch, MD;  Location: [facility] CATH LAB;  Service: Cardiovascular;;    Family History  Problem Relation Age of Onset  . Emphysema Father   . Dementia Mother   .  Diabetes Mother   . Hypertension Mother   . Hypothyroidism Sister   . Prostate cancer Neg Hx   . Colon cancer Neg Hx   . Dementia Brother   . Hypertension Brother   . Diabetes Son   . Heart disease Son     Heart Disease before age 65- Open Heart 2010  . Hypertension Son   . Stroke Son     while on coumadin    No Known Allergies  Current Outpatient Prescriptions on File Prior to Visit  Medication Sig Dispense Refill  . amLODipine (NORVASC) 5 MG tablet TAKE ONE TABLET BY MOUTH AT BEDTIME 90 tablet 2  . aspirin 325 MG tablet Take 325 mg by mouth daily.    Marland Kitchen atorvastatin (LIPITOR) 80 MG tablet TAKE ONE TABLET BY MOUTH AT BEDTIME 90 tablet 2  . Cinnamon 500 MG capsule Take 500 mg by mouth daily.    . fish oil-omega-3 fatty acids 1000 MG capsule Take 1 g by mouth 3 (three) times daily.      . methocarbamol (ROBAXIN) 500 MG tablet Take 1 tablet (500 mg  total) by mouth at bedtime as needed (for restless legs). 50 tablet 2  . Multiple Vitamins-Minerals (PRESERVISION AREDS 2 PO) Take by mouth.    . psyllium (REGULOID) 0.52 G capsule Take 0.52 g by mouth daily.    . ramipril (ALTACE) 5 MG capsule TAKE ONE CAPSULE BY MOUTH ONCE DAILY 90 capsule 2  . tamsulosin (FLOMAX) 0.4 MG CAPS capsule TAKE ONE CAPSULE BY MOUTH ONCE DAILY 90 capsule 0   No current facility-administered medications on file prior to visit.    BP 152/92 mmHg  Pulse 70  Temp(Src) 98 F (36.7 C) (Oral)  Ht 6' 2.5" (1.892 m)  Wt 244 lb 6.4 oz (110.859 kg)  BMI 30.97 kg/m2  SpO2 98%    Objective:   Physical Exam  Constitutional: He appears well-nourished.  HENT:  Nose: Nose normal. Right sinus exhibits no maxillary sinus tenderness and no frontal sinus tenderness. Left sinus exhibits no maxillary sinus tenderness and no frontal sinus tenderness.  Mouth/Throat: Oropharynx is clear and moist.  Eyes: Conjunctivae are normal. Pupils are equal, round, and reactive to light.  Neck: Neck supple.  Cardiovascular: Normal rate.   Irregular rhythm, history of A-Fib  Pulmonary/Chest: He has no decreased breath sounds. He has no wheezes. He has rhonchi in the right upper field, the right lower field, the left upper field and the left lower field.  Lymphadenopathy:    He has no cervical adenopathy.  Skin: Skin is warm and dry.          Assessment & Plan:  Acute Bronchitis:  Cough x 1 week, productive with green sputum, worse over the weekend. No fevers, chills, SOB. Exam with rhonchi throughout. No decreased breath sounds or wheezing. Due to duration and examination will initiate antibiotic therapy. Start Augmentin course. Tessalon Pearls provided for cough. Fluids, rest. Follow up if no improvement in 3-4 days.

## 2015-05-13 NOTE — Progress Notes (Signed)
Pre visit review using our clinic review tool, if applicable. No additional management support is needed unless otherwise documented below in the visit note. 

## 2015-05-13 NOTE — Addendum Note (Signed)
Addended by: Jacqualin Combes on: 05/13/2015 01:58 PM   Modules accepted: Medications

## 2015-06-04 ENCOUNTER — Encounter: Payer: Self-pay | Admitting: Family Medicine

## 2015-06-04 ENCOUNTER — Ambulatory Visit (INDEPENDENT_AMBULATORY_CARE_PROVIDER_SITE_OTHER): Payer: Medicare Other | Admitting: Family Medicine

## 2015-06-04 ENCOUNTER — Other Ambulatory Visit: Payer: Self-pay | Admitting: *Deleted

## 2015-06-04 VITALS — BP 178/98 | HR 68 | Temp 98.7°F | Resp 18 | Wt 243.0 lb

## 2015-06-04 DIAGNOSIS — R05 Cough: Secondary | ICD-10-CM | POA: Diagnosis not present

## 2015-06-04 DIAGNOSIS — I4891 Unspecified atrial fibrillation: Secondary | ICD-10-CM | POA: Diagnosis not present

## 2015-06-04 DIAGNOSIS — R059 Cough, unspecified: Secondary | ICD-10-CM

## 2015-06-04 MED ORDER — DOXYCYCLINE HYCLATE 100 MG PO TABS: 100.0000 mg | ORAL_TABLET | Freq: Two times a day (BID) | ORAL | Status: DC

## 2015-06-04 MED ORDER — ALBUTEROL SULFATE HFA 108 (90 BASE) MCG/ACT IN AERS: 1.0000 | INHALATION_SPRAY | Freq: Four times a day (QID) | RESPIRATORY_TRACT | Status: DC | PRN

## 2015-06-04 NOTE — Progress Notes (Signed)
Pre visit review using our clinic review tool, if applicable. No additional management support is needed unless otherwise documented below in the visit note.  Sick for 1 week prior to 05/13/15 OV.  Started on augmentin.  Was getting some better but not fully well.  Then got sicker again 4-5 days ago.  Still had cough from prev bu then got worse.  Still with dry cough.  No fevers.  No pain with deep breath.  Coughing in fits.  No rhinorrhea, no ST.  No ear pain.  He doesn't feel poorly, but still has the cough that has worsened.  No abd pain, no diarrhea.  Some wheeze, doesn't normal wheeze at baseline.  H/o AF but no sensation of heart racing.    Recheck BP 160/80, had been controlled at home, usually 130/80s.  H/o elevated at clinic noted.    Meds, vitals, and allergies reviewed.   ROS: See HPI.  Otherwise, noncontributory.  nad ncat Tm wnl Nasal and OP exam wnl Neck supple, no LA, + UAN IRR, not tachy ctab abd soft Ext w/o edema

## 2015-06-04 NOTE — Patient Instructions (Signed)
Start doxy.  If the wheeze/cough continue, then try 1 puff of albuterol.   Rosaria Ferries will call about your referral for the cardiac clinic.  Take care.  Glad to see you.

## 2015-06-05 NOTE — Assessment & Plan Note (Signed)
Presumed bronchitis, possible atypical infection.  dw pt.  Start doxy. If the wheeze/cough continue, then try 1 puff of albuterol.  Caution re: SABA given the fact that he is likely back in AF.   He wasn't tachy today, so he appears okay for outpatient f/u.  Will have him f/u with cards in the meantime.  Nontoxic and still well appearing (he was able to tell me a joke on the way out today, which is baseline for him) He agrees with plan.

## 2015-06-06 ENCOUNTER — Other Ambulatory Visit: Payer: Self-pay | Admitting: *Deleted

## 2015-06-06 MED ORDER — ALBUTEROL SULFATE HFA 108 (90 BASE) MCG/ACT IN AERS: 1.0000 | INHALATION_SPRAY | Freq: Four times a day (QID) | RESPIRATORY_TRACT | Status: DC | PRN

## 2015-07-11 ENCOUNTER — Ambulatory Visit (HOSPITAL_COMMUNITY)
Admission: RE | Admit: 2015-07-11 | Discharge: 2015-07-11 | Disposition: A | Discharge status: Home / Self Care | Payer: Medicare Other | Source: Ambulatory Visit | Attending: Family | Admitting: Family

## 2015-07-11 ENCOUNTER — Other Ambulatory Visit (HOSPITAL_COMMUNITY): Payer: Medicare Other

## 2015-07-11 ENCOUNTER — Other Ambulatory Visit: Payer: Self-pay | Admitting: Family

## 2015-07-11 ENCOUNTER — Ambulatory Visit: Payer: Medicare Other | Admitting: Family

## 2015-07-11 DIAGNOSIS — I10 Essential (primary) hypertension: Secondary | ICD-10-CM | POA: Diagnosis not present

## 2015-07-11 DIAGNOSIS — E119 Type 2 diabetes mellitus without complications: Secondary | ICD-10-CM | POA: Insufficient documentation

## 2015-07-11 DIAGNOSIS — Z48812 Encounter for surgical aftercare following surgery on the circulatory system: Secondary | ICD-10-CM | POA: Insufficient documentation

## 2015-07-11 DIAGNOSIS — E785 Hyperlipidemia, unspecified: Secondary | ICD-10-CM | POA: Insufficient documentation

## 2015-07-11 DIAGNOSIS — I6523 Occlusion and stenosis of bilateral carotid arteries: Secondary | ICD-10-CM

## 2015-07-11 DIAGNOSIS — I6522 Occlusion and stenosis of left carotid artery: Secondary | ICD-10-CM

## 2015-07-12 ENCOUNTER — Encounter (HOSPITAL_COMMUNITY): Payer: Self-pay

## 2015-07-12 ENCOUNTER — Ambulatory Visit: Payer: Self-pay | Admitting: Family

## 2015-07-22 ENCOUNTER — Other Ambulatory Visit: Payer: Self-pay | Admitting: Family Medicine

## 2015-08-05 ENCOUNTER — Encounter: Payer: Self-pay | Admitting: Cardiovascular Disease

## 2015-08-05 ENCOUNTER — Ambulatory Visit (INDEPENDENT_AMBULATORY_CARE_PROVIDER_SITE_OTHER): Payer: Medicare Other | Admitting: Cardiovascular Disease

## 2015-08-05 VITALS — BP 170/92 | HR 76 | Ht 76.0 in | Wt 246.2 lb

## 2015-08-05 DIAGNOSIS — I4891 Unspecified atrial fibrillation: Secondary | ICD-10-CM

## 2015-08-05 DIAGNOSIS — I10 Essential (primary) hypertension: Secondary | ICD-10-CM

## 2015-08-05 DIAGNOSIS — E785 Hyperlipidemia, unspecified: Secondary | ICD-10-CM

## 2015-08-05 MED ORDER — RAMIPRIL 10 MG PO CAPS: 10.0000 mg | ORAL_CAPSULE | Freq: Every day | ORAL | Status: DC

## 2015-08-05 MED ORDER — APIXABAN 5 MG PO TABS: 5.0000 mg | ORAL_TABLET | Freq: Two times a day (BID) | ORAL | Status: DC

## 2015-08-05 NOTE — Assessment & Plan Note (Signed)
The patient has evidence of recurrent atrial fibrillation. He is asymptomatic from this and he did not feel any better after cardioversion in 2013. Thus, I don't think there is much benefit of proceeding with another cardioversion. His ventricular rate is controlled without any medications. CHADS VASc score is 5. Thus, he is at high risk for thromboembolic complications and I recommended initiating anticoagulation. He supposedly did not tolerate Xarelto in the past due to dizziness and vertigo. I elected to start Eliquis 5 mg twice daily. I discontinued aspirin which has no role in atrial fibrillation.  Given his symptoms of shortness of breath, I requested an echocardiogram.

## 2015-08-05 NOTE — Assessment & Plan Note (Signed)
Blood pressure is elevated. I increased the dose of ramipril to 10 mg once daily. His blood pressure medications were decreased in the past due to orthostatic dizziness.

## 2015-08-05 NOTE — Progress Notes (Signed)
Primary care physician: Dr. Damita Dunnings  HPI  This is a pleasant 82 year old man who was referred by Dr. Damita Dunnings for management of atrial fibrillation.he was seen by Dr. Martinique in December 2013 for newly diagnosed atrial fibrillation. He underwent successful cardioversion after anticoagulation with Xarelto. The patient had no improvement in shortness of breath after cardioversion and did not feel any better. His biggest issue at that time was postural dizziness which required decreasing his blood pressure medications. According to the patient, the cause of his symptoms was Xarelto which led to vertigo.   He has chronic medical conditions that include hypertension, hyperlipidemia, type 2 diabetes and carotid disease status post left carotid endarterectomy. He has lung cancer status post resection and chemotherapy in 2002. She was found recently to have atrial fibrillation. The patient reports no symptoms related to this. He denies chest pain or palpitations. Currently he is not having dizziness. He has prolonged history of exertional dyspnea. He goes to the gym 4 times a week but mainly he does some weightlifting with no aerobic exercises. He has no history of prior stroke.     Allergies  Allergen Reactions  . Xarelto [Rivaroxaban] Other (See Comments)    vertigo     Current Outpatient Prescriptions on File Prior to Visit  Medication Sig Dispense Refill  . albuterol (PROAIR HFA) 108 (90 BASE) MCG/ACT inhaler Inhale 1 puff into the lungs every 6 (six) hours as needed. 1 Inhaler 1  . amLODipine (NORVASC) 5 MG tablet TAKE ONE TABLET BY MOUTH AT BEDTIME 90 tablet 2  . atorvastatin (LIPITOR) 80 MG tablet TAKE ONE TABLET BY MOUTH AT BEDTIME 90 tablet 2  . Cinnamon 500 MG capsule Take 500 mg by mouth daily.    Marland Kitchen doxycycline (VIBRA-TABS) 100 MG tablet Take 1 tablet (100 mg total) by mouth 2 (two) times daily. 20 tablet 0  . fish oil-omega-3 fatty acids 1000 MG capsule Take 1 g by mouth 3 (three) times  daily.      . methocarbamol (ROBAXIN) 500 MG tablet Take 1 tablet (500 mg total) by mouth at bedtime as needed (for restless legs). 50 tablet 2  . Multiple Vitamins-Minerals (PRESERVISION AREDS 2 PO) Preser Vision Vitamin  Take 2 capsule daily.    . psyllium (REGULOID) 0.52 G capsule Take 0.52 g by mouth daily.    . tamsulosin (FLOMAX) 0.4 MG CAPS capsule TAKE ONE CAPSULE BY MOUTH ONCE DAILY 90 capsule 0   No current facility-administered medications on file prior to visit.     Past Medical History  Diagnosis Date  . HLD (hyperlipidemia)   . HTN (hypertension)   . BPH (benign prostatic hypertrophy)   . Cancer ([place])     lung cancer dx'd 2002  . Abdominal bruit 05/05/2012  . Atrial fibrillation ([place])   . Diabetes mellitus type II     diet controlled   . Arthritis     back pain, past lumbar surgery   . Carotid arterial disease [facility])      Past Surgical History  Procedure Laterality Date  . Hemorrhoid surgery  1970  . Cataract extraction      right  . Retinal detachment surgery      right  . Wedge resection  03/15/01    LUL [facility] Hockey)  . Lobectomy  03/15/01    LUL [facility] Hockey)  . Spine surgery  approx 2006    Ruptered Disc  . Total knee arthroplasty      x2 per Dr. Rush Farmer  . Eye  surgery      R detached retina- 1990's, IOL implants also   . Tonsillectomy      1939  . Endarterectomy  05/30/2012    Procedure: ENDARTERECTOMY CAROTID;  Surgeon: Elam Dutch, MD;  Location: [place];  Service: Vascular;  Laterality: Left;  . Cardioversion  06/29/2012    Procedure: CARDIOVERSION;  Surgeon: Thayer Headings, MD;  Location: [facility] ENDOSCOPY;  Service: Cardiovascular;  Laterality: N/A;  . Carotid endarterectomy Left 05/30/12  . Joint replacement      knee- bilateral- 2009 and 2012  . Carotid angiogram N/A 05/27/2012    Procedure: CAROTID ANGIOGRAM;  Surgeon: Elam Dutch, MD;  Location: [facility] CATH LAB;  Service: Cardiovascular;  Laterality: N/A;  . Arch aortogram  05/27/2012      Procedure: ARCH AORTOGRAM;  Surgeon: Elam Dutch, MD;  Location: [facility] CATH LAB;  Service: Cardiovascular;;     Family History  Problem Relation Age of Onset  . Emphysema Father   . Dementia Mother   . Diabetes Mother   . Hypertension Mother   . Hypothyroidism Sister   . Prostate cancer Neg Hx   . Colon cancer Neg Hx   . Dementia Brother   . Hypertension Brother   . Diabetes Son   . Heart disease Son     Heart Disease before age 66- Open Heart 2010  . Hypertension Son   . Stroke Son     while on coumadin     Social History   Social History  . Marital Status: Married    Spouse Name: N/A  . Number of Children: 3  . Years of Education: N/A   Occupational History  . Fired then [employer]    Social History Main Topics  . Smoking status: Former Smoker -- 1.00 packs/day for 32 years    Types: Cigarettes    Quit date: 09/03/1982  . Smokeless tobacco: Never Used  . Alcohol Use: No     Comment: quit 2003 w exception of wine in winter (a little)   . Drug Use: No  . Sexual Activity: Yes   Other Topics Concern  . Not on file   Social History Narrative   Retired Personal assistant   From [employer]   Married happily since [place] 10 point review of system was performed. It is negative other than that mentioned in the history of present illness.   PHYSICAL EXAM   BP 170/92 mmHg  Pulse 76  Ht '6\' 4"'$  (1.93 m)  Wt 246 lb 4 oz (111.698 kg)  BMI 29.99 kg/m2 Constitutional: He is oriented to person, place, and time. He appears well-developed and well-nourished. No distress.  HENT: No nasal discharge.  Head: Normocephalic and atraumatic.  Eyes: Pupils are equal and round.  No discharge. Neck: Normal range of motion. Neck supple. No JVD present. No thyromegaly present.  Cardiovascular: Normal rate, irregular rhythm, normal heart sounds. Exam reveals no gallop and no friction rub. No murmur heard.  Pulmonary/Chest: Effort normal and breath sounds  normal. No stridor. No respiratory distress. He has no wheezes. He has no rales. He exhibits no tenderness.  Abdominal: Soft. Bowel sounds are normal. He exhibits no distension. There is no tenderness. There is no rebound and no guarding.  Musculoskeletal: Normal range of motion. He exhibits no edema and no tenderness.  Neurological: He is alert and oriented to person, place, and time. Coordination normal.  Skin: Skin is warm and dry. No  rash noted. He is not diaphoretic. No erythema. No pallor.  Psychiatric: He has a normal mood and affect. His behavior is normal. Judgment and thought content normal.       EKG: Atrial fibrillation with left anterior fascicular block. Poor R-wave progression in the anterior leads which is not new.   ASSESSMENT AND PLAN

## 2015-08-05 NOTE — Patient Instructions (Signed)
Medication Instructions:  Your physician has recommended you make the following change in your medication:  STOP taking aspirin START taking eliquis '5mg'$  twice daily INCREASE ramipril to '10mg'$  daily   Labwork: none  Testing/Procedures: Your physician has requested that you have an echocardiogram. Echocardiography is a painless test that uses sound waves to create images of your heart. It provides your doctor with information about the size and shape of your heart and how well your heart's chambers and valves are working. This procedure takes approximately one hour. There are no restrictions for this procedure.    Follow-Up: Your physician recommends that you schedule a follow-up appointment in: three months with Dr. Fletcher Anon.    Any Other Special Instructions Will Be Listed Below (If Applicable).     If you need a refill on your cardiac medications before your next appointment, please call your pharmacy.  Echocardiogram An echocardiogram, or echocardiography, uses sound waves (ultrasound) to produce an image of your heart. The echocardiogram is simple, painless, obtained within a short period of time, and offers valuable information to your health care provider. The images from an echocardiogram can provide information such as:  Evidence of coronary artery disease (CAD).  Heart size.  Heart muscle function.  Heart valve function.  Aneurysm detection.  Evidence of a past heart attack.  Fluid buildup around the heart.  Heart muscle thickening.  Assess heart valve function. LET [facility] CARE PROVIDER KNOW ABOUT:  Any allergies you have.  All medicines you are taking, including vitamins, herbs, eye drops, creams, and over-the-counter medicines.  Previous problems you or members of your family have had with the use of anesthetics.  Any blood disorders you have.  Previous surgeries you have had.  Medical conditions you have.  Possibility of pregnancy, if this  applies. BEFORE THE PROCEDURE  No special preparation is needed. Eat and drink normally.  PROCEDURE   In order to produce an image of your heart, gel will be applied to your chest and a wand-like tool (transducer) will be moved over your chest. The gel will help transmit the sound waves from the transducer. The sound waves will harmlessly bounce off your heart to allow the heart images to be captured in real-time motion. These images will then be recorded.  You may need an IV to receive a medicine that improves the quality of the pictures. AFTER THE PROCEDURE You may return to your normal schedule including diet, activities, and medicines, unless your health care provider tells you otherwise.   This information is not intended to replace advice given to you by your health care provider. Make sure you discuss any questions you have with your health care provider.   Document Released: 07/03/2000 Document Revised: 07/27/2014 Document Reviewed: 03/13/2013 Elsevier Interactive Patient Education [employer].

## 2015-08-05 NOTE — Assessment & Plan Note (Signed)
Lab Results  Component Value Date   CHOL 179 02/11/2015   HDL 48.00 02/11/2015   LDLCALC 107* 02/11/2015   TRIG 119.0 02/11/2015   CHOLHDL 4 02/11/2015   Continue treatment with atorvastatin. Target LDL is less than 100.

## 2015-08-13 ENCOUNTER — Ambulatory Visit (INDEPENDENT_AMBULATORY_CARE_PROVIDER_SITE_OTHER): Payer: Medicare Other

## 2015-08-13 ENCOUNTER — Other Ambulatory Visit: Payer: Self-pay

## 2015-08-13 DIAGNOSIS — I4891 Unspecified atrial fibrillation: Secondary | ICD-10-CM | POA: Diagnosis not present

## 2015-08-14 ENCOUNTER — Ambulatory Visit (INDEPENDENT_AMBULATORY_CARE_PROVIDER_SITE_OTHER): Payer: Medicare Other | Admitting: Ophthalmology

## 2015-08-14 DIAGNOSIS — E113293 Type 2 diabetes mellitus with mild nonproliferative diabetic retinopathy without macular edema, bilateral: Secondary | ICD-10-CM

## 2015-08-14 DIAGNOSIS — I10 Essential (primary) hypertension: Secondary | ICD-10-CM

## 2015-08-14 DIAGNOSIS — H338 Other retinal detachments: Secondary | ICD-10-CM

## 2015-08-14 DIAGNOSIS — H43813 Vitreous degeneration, bilateral: Secondary | ICD-10-CM | POA: Diagnosis not present

## 2015-08-14 DIAGNOSIS — E11319 Type 2 diabetes mellitus with unspecified diabetic retinopathy without macular edema: Secondary | ICD-10-CM

## 2015-08-14 DIAGNOSIS — H353132 Nonexudative age-related macular degeneration, bilateral, intermediate dry stage: Secondary | ICD-10-CM

## 2015-08-14 DIAGNOSIS — H35033 Hypertensive retinopathy, bilateral: Secondary | ICD-10-CM | POA: Diagnosis not present

## 2015-09-03 ENCOUNTER — Other Ambulatory Visit: Payer: Self-pay | Admitting: *Deleted

## 2015-09-04 ENCOUNTER — Telehealth: Payer: Self-pay | Admitting: *Deleted

## 2015-09-04 ENCOUNTER — Telehealth: Payer: Self-pay | Admitting: Cardiovascular Disease

## 2015-09-04 NOTE — Telephone Encounter (Signed)
Patient calling the office for samples of medication:   1.  What medication and dosage are you requesting samples for? 5 mg Eliquis   2.  Are you currently out of this medication? Has been out for a few day.   Pt wife stating pt saw Dr Fletcher Anon 08/05/15 we gave them a 10 discount card and now patient has run out and they also found out insurance won't cover it Would need [place] to authorize it.  Pt is going out of town Friday morning would need something to last them until we get this straightened out.  Please call back with advise for them .

## 2015-09-04 NOTE — Telephone Encounter (Signed)
Pt requiring PA for Eliquis. PA has been filled out and faxed to [facility] awaiting PA approval.

## 2015-09-04 NOTE — Telephone Encounter (Signed)
Pt has been approved for Eliquis 5 mg tablet until 07/19/16.

## 2015-09-05 ENCOUNTER — Other Ambulatory Visit: Payer: Self-pay | Admitting: *Deleted

## 2015-09-05 MED ORDER — APIXABAN 5 MG PO TABS: 5.0000 mg | ORAL_TABLET | Freq: Two times a day (BID) | ORAL | Status: DC

## 2015-09-05 NOTE — Telephone Encounter (Signed)
Britt Bottom, CMA at 09/04/2015 1:43 PM     Status: Signed       Expand All Collapse All   Pt has been approved for Eliquis 5 mg tablet until 07/19/16.        Pt is aware Rx has been sent to his local pharmacy.

## 2015-09-05 NOTE — Telephone Encounter (Signed)
Requested Prescriptions   Signed Prescriptions Disp Refills  . apixaban (ELIQUIS) 5 MG TABS tablet 60 tablet 3    Sig: Take 1 tablet (5 mg total) by mouth 2 (two) times daily.    Authorizing Provider: Kathlyn Sacramento A    Ordering User: Britt Bottom

## 2015-09-10 ENCOUNTER — Ambulatory Visit (INDEPENDENT_AMBULATORY_CARE_PROVIDER_SITE_OTHER): Payer: Medicare Other

## 2015-09-10 VITALS — BP 172/88 | HR 71 | Temp 98.5°F | Ht 75.25 in | Wt 246.5 lb

## 2015-09-10 DIAGNOSIS — Z0000 Encounter for general adult medical examination without abnormal findings: Secondary | ICD-10-CM | POA: Diagnosis not present

## 2015-09-10 DIAGNOSIS — E119 Type 2 diabetes mellitus without complications: Secondary | ICD-10-CM

## 2015-09-10 LAB — HEMOGLOBIN A1C: HEMOGLOBIN A1C: 7.6 % — AB (ref 4.6–6.5)

## 2015-09-10 NOTE — Progress Notes (Signed)
Pre visit review using our clinic review tool, if applicable. No additional management support is needed unless otherwise documented below in the visit note. 

## 2015-09-10 NOTE — Patient Instructions (Addendum)
Jonathan York , Thank you for taking time to come for your Medicare Wellness Visit. I appreciate your ongoing commitment to your health goals. Please review the following plan we discussed and let me know if I can assist you in the future.   These are the goals we discussed: Goals    Starting 09/10/2015, I will decrease intake of ice cream to one day per week.       This is a list of the screening recommended for you and due dates:  Health Maintenance  Topic Date Due  . Hemoglobin A1C  02/07/2016  . Tetanus Vaccine  09/17/2016*  . Eye exam for diabetics  01/15/2016  . Complete foot exam   02/11/2016  . Flu Shot  02/18/2016  . Shingles Vaccine  Completed  . Pneumonia vaccines  Completed  *Topic was postponed. The date shown is not the original due date.      Preventive Care for Adults  A healthy lifestyle and preventive care can promote health and wellness. Preventive health guidelines for adults include the following key practices.  . A routine yearly physical is a good way to check with your health care provider about your health and preventive screening. It is a chance to share any concerns and updates on your health and to receive a thorough exam.  . Visit your dentist for a routine exam and preventive care every 6 months. Brush your teeth twice a day and floss once a day. Good oral hygiene prevents tooth decay and gum disease.  . The frequency of eye exams is based on your age, health, family medical history, use  of contact lenses, and other factors. Follow your health care provider's ecommendations for frequency of eye exams.  . Eat a healthy diet. Foods like vegetables, fruits, whole grains, low-fat dairy products, and lean protein foods contain the nutrients you need without too many calories. Decrease your intake of foods high in solid fats, added sugars, and salt. Eat the right amount of calories for you. Get information about a proper diet from your health care provider, if  necessary.  . Regular physical exercise is one of the most important things you can do for your health. Most adults should get at least 150 minutes of moderate-intensity exercise (any activity that increases your heart rate and causes you to sweat) each week. In addition, most adults need muscle-strengthening exercises on 2 or more days a week.  . Maintain a healthy weight. The body mass index (BMI) is a screening tool to identify possible weight problems. It provides an estimate of body fat based on height and weight. Your health care provider can find your BMI and can help you achieve or maintain a healthy weight.  For adults 20 years and older: ? A BMI below 18.5 is considered underweight. ? A BMI of 18.5 to 24.9 is normal. ? A BMI of 25 to 29.9 is considered overweight. ? A BMI of 30 and above is considered obese.   . Maintain normal blood lipids and cholesterol levels by exercising and minimizing your intake of saturated fat. Eat a balanced diet with plenty of fruit and vegetables. Blood tests for lipids and cholesterol should begin at age 46 and be repeated every 5 years. If your lipid or cholesterol levels are high, you are over 50, or you are at high risk for heart disease, you may need your cholesterol levels checked more frequently. Ongoing high lipid and cholesterol levels should be treated with medicines if diet  and exercise are not working.  . If you smoke, find out from your health care provider how to quit. If you do not use tobacco, please do not start.  . If you choose to drink alcohol, please do not consume more than 2 drinks per day. One drink is considered to be 12 ounces (355 mL) of beer, 5 ounces (148 mL) of wine, or 1.5 ounces (44 mL) of liquor.  . If you are 74-37 years old, ask your health care provider if you should take aspirin to prevent strokes.  . Use sunscreen. Apply sunscreen liberally and repeatedly throughout the day. You should seek shade when your shadow is  shorter than you. Protect yourself by wearing long sleeves, pants, a wide-brimmed hat, and sunglasses year round, whenever you are outdoors.  . Once a month, do a whole body skin exam, using a mirror to look at the skin on your back. Tell your health care provider of new moles, moles that have irregular borders, moles that are larger than a pencil eraser, or moles that have changed in shape or color.  . Osteoporosis is a disease in which the bones lose minerals and strength with aging. This can result in serious bone fractures or breaks. The risk of osteoporosis can be identified using a bone density scan. Women ages 61 years and over and women at risk for fractures or osteoporosis should discuss screening with their health care providers. Ask your health care provider whether you should take a calcium supplement or vitamin D to reduce the rate of osteoporosis.  . For Women only: Menopause can be associated with physical symptoms and risks. Hormone replacement therapy is available to decrease symptoms and risks. You should talk to your health care provider about whether hormone replacement therapy is right for you.

## 2015-09-10 NOTE — Progress Notes (Signed)
Subjective:   Jonathan York is a 82 y.o. male who presents for Medicare Annual/Subsequent preventive examination.  Review of Systems:  No ROS  Cardiac Risk Factors include: advanced age (>33mn, >>41women);diabetes mellitus;dyslipidemia;family history of premature cardiovascular disease;hypertension;male gender     Objective:    Vitals: BP 172/88 mmHg  Pulse 71  Temp(Src) 98.5 F (36.9 C) (Oral)  Ht 6' 3.25" (1.911 m)  Wt 246 lb 8 oz (111.812 kg)  BMI 30.62 kg/m2  SpO2 95%  Tobacco History  Smoking status  . Former Smoker -- 1.00 packs/day for 32 years  . Types: Cigarettes  . Quit date: 09/03/1982  Smokeless tobacco  . Never Used     Counseling given: No   Past Medical History  Diagnosis Date  . HLD (hyperlipidemia)   . HTN (hypertension)   . BPH (benign prostatic hypertrophy)   . [place]     lung cancer dx'd 2002  . Abdominal bruit 05/05/2012  . [place]   . Diabetes mellitus type II     diet controlled   . Arthritis     back pain, past lumbar surgery   . Carotid arterial disease ([facility]    Past Surgical History  Procedure Laterality Date  . Hemorrhoid surgery  1970  . Cataract extraction      right  . Retinal detachment surgery      right  . Wedge resection  03/15/01    LUL ([facility]  . Lobectomy  03/15/01    LUL ([facility]ox[facility] Hockey  . Spine surgery  approx 2006    Ruptered Disc  . Total knee arthroplasty      x2 per Dr. BRush Farmer . Eye surgery      Jonathan detached retina- 1990's, IOL implants also   . Tonsillectomy      1939  . Endarterectomy  05/30/2012    Procedure: ENDARTERECTOMY CAROTID;  Surgeon: CElam Dutch MD;  [place]  Service: V[place];  Laterality: Left;  . Cardioversion  06/29/2012    Procedure: CARDIOVERSION;  Surgeon: PThayer Headings MD;  [facility]  Service: Cardiovascular;  Laterality: N/A;  . Carotid endarterectomy Left 05/30/12  . Joint replacement      knee- bilateral- 2009 and 2012    . Carotid angiogram N/A 05/27/2012    Procedure: CAROTID ANGIOGRAM;  Surgeon: CElam Dutch MD;  [facility];  Service: Cardiovascular;  Laterality: N/A;  . Arch aortogram  05/27/2012    Procedure: ARCH AORTOGRAM;  Surgeon: CElam Dutch MD;  [facility];  Service: Cardiovascular;;   Family History  Problem Relation Age of Onset  . Emphysema Father   . Dementia Mother   . Diabetes Mother   . Hypertension Mother   . Hypothyroidism Sister   . Prostate cancer Neg Hx   . Colon cancer Neg Hx   . Dementia Brother   . Hypertension Brother   . Diabetes Son   . Heart disease Son     Heart Disease before age 80 Open Heart 2010  . Hypertension Son   . Stroke Son     while on coumadin   History  Sexual Activity  . Sexual Activity: Yes    Outpatient Encounter Prescriptions as of 09/10/2015  Medication Sig  . albuterol (PROAIR HFA) 108 (90 BASE) MCG/ACT inhaler Inhale 1 puff into the lungs every 6 (six) hours as needed.  .Marland KitchenamLODipine (NORVASC) 5 MG tablet TAKE ONE TABLET BY MOUTH AT  BEDTIME  . apixaban (ELIQUIS) 5 MG TABS tablet Take 1 tablet (5 mg total) by mouth 2 (two) times daily.  Marland Kitchen atorvastatin (LIPITOR) 80 MG tablet TAKE ONE TABLET BY MOUTH AT BEDTIME  . Cinnamon 500 MG capsule Take 500 mg by mouth daily.  Marland Kitchen doxycycline (VIBRA-TABS) 100 MG tablet Take 1 tablet (100 mg total) by mouth 2 (two) times daily.  . fish oil-omega-3 fatty acids 1000 MG capsule Take 1 g by mouth 3 (three) times daily.    . methocarbamol (ROBAXIN) 500 MG tablet Take 1 tablet (500 mg total) by mouth at bedtime as needed (for restless legs).  . Multiple Vitamins-Minerals (PRESERVISION AREDS 2 PO) Preser Vision Vitamin  Take 2 capsule daily.  . psyllium (REGULOID) 0.52 G capsule Take 0.52 g by mouth daily.  . ramipril (ALTACE) 10 MG capsule Take 1 capsule (10 mg total) by mouth daily.  . tamsulosin (FLOMAX) 0.4 MG CAPS capsule TAKE ONE CAPSULE BY MOUTH ONCE DAILY   No  facility-administered encounter medications on file as of 09/10/2015.    Activities of Daily Living In your present state of health, do you have any difficulty performing the following activities: 09/10/2015  Hearing? N  Vision? N  Difficulty concentrating or making decisions? N  Walking or climbing stairs? N  Dressing or bathing? N  Doing errands, shopping? N  Preparing Food and eating ? N  Using the Toilet? N  In the past six months, have you accidently leaked urine? N  Do you have problems with loss of bowel control? N  Managing your Medications? N  Managing your Finances? N  Housekeeping or managing your Housekeeping? N    Patient Care Team: Tonia Ghent, MD as PCP - General (Family Medicine) Peter M Martinique, MD (Cardiology) Wellington Hampshire, MD as Consulting Physician (Cardiology) Provider list updated.   Assessment:     Exercise Activities and Dietary recommendations    Goals    . Cut out extra servings     Starting 09/10/2015, I will decrease intake of ice cream to one day per week.       Fall Risk Fall Risk  09/10/2015 02/11/2015 01/25/2014 01/24/2013  Falls in the past year? No No No No   Depression Screen PHQ 2/9 Scores 09/10/2015 02/11/2015 01/25/2014 01/24/2013  PHQ - 2 Score 0 0 0 0    Cognitive Testing MMSE - Mini Mental State Exam 09/10/2015  Orientation to time 5  Orientation to Place 5  Registration 3  Attention/ Calculation 5  Recall 3  Language- repeat 1  Language- follow 3 step command 3  Language- read & follow direction 1    Immunization History  Administered Date(s) Administered  . Influenza Split 05/27/2011, 04/27/2012  . Influenza Whole 05/20/2004, 04/20/2007, 04/09/2008, 04/30/2009, 04/08/2010  . Influenza,inj,Quad PF,36+ Mos 03/29/2013, 04/05/2014, 04/26/2015  . Pneumococcal Conjugate-13 02/11/2015  . Pneumococcal Polysaccharide-23 03/09/2002, 03/03/2008  . Td 09/14/2002  . Zoster 01/02/2010, 01/04/2010   Screening Tests Health  Maintenance  Topic Date Due  . HEMOGLOBIN A1C  Completed 09/10/2015  . TETANUS/TDAP  09/17/2016 (Originally 09/14/2012)  . OPHTHALMOLOGY EXAM  01/15/2016  . FOOT EXAM  02/11/2016  . INFLUENZA VACCINE  02/18/2016  . ZOSTAVAX  Completed  . PNA vac Low Risk Adult  Completed      Plan:    During the course of the visit the patient was educated and counseled about the following appropriate screening and preventive services:   Tetanus - pt will research benefit coverage  regarding vaccine  Audiology exam - declined; failed hearing screen  Advanced directives - copy requested   Patient Instructions (the written plan) was given to the patient.   See attached scanned questionnaire for additional information.   Lindell Noe, LPN  12/18/5613

## 2015-09-12 ENCOUNTER — Encounter: Payer: Self-pay | Admitting: *Deleted

## 2015-10-11 ENCOUNTER — Other Ambulatory Visit: Payer: Self-pay | Admitting: Family Medicine

## 2015-10-30 DIAGNOSIS — H35341 Macular cyst, hole, or pseudohole, right eye: Secondary | ICD-10-CM | POA: Diagnosis not present

## 2015-10-30 DIAGNOSIS — H59811 Chorioretinal scars after surgery for detachment, right eye: Secondary | ICD-10-CM | POA: Diagnosis not present

## 2015-10-30 DIAGNOSIS — Z9841 Cataract extraction status, right eye: Secondary | ICD-10-CM | POA: Diagnosis not present

## 2015-10-30 DIAGNOSIS — Z9842 Cataract extraction status, left eye: Secondary | ICD-10-CM | POA: Diagnosis not present

## 2015-11-04 ENCOUNTER — Ambulatory Visit (INDEPENDENT_AMBULATORY_CARE_PROVIDER_SITE_OTHER): Payer: Medicare Other | Admitting: Cardiovascular Disease

## 2015-11-04 ENCOUNTER — Encounter: Payer: Self-pay | Admitting: Cardiovascular Disease

## 2015-11-04 ENCOUNTER — Encounter (INDEPENDENT_AMBULATORY_CARE_PROVIDER_SITE_OTHER): Payer: Self-pay

## 2015-11-04 VITALS — BP 130/68 | HR 66 | Ht 75.0 in | Wt 244.5 lb

## 2015-11-04 DIAGNOSIS — I4891 Unspecified atrial fibrillation: Secondary | ICD-10-CM

## 2015-11-04 NOTE — Patient Instructions (Signed)
Medication Instructions:  Your physician recommends that you continue on your current medications as directed. Please refer to the Current Medication list given to you today.   Labwork: BMET, CBC  Testing/Procedures: none  Follow-Up: Your physician wants you to follow-up in: six months with Dr. Fletcher Anon.  You will receive a reminder letter in the mail two months in advance. If you don't receive a letter, please call our office to schedule the follow-up appointment.   Any Other Special Instructions Will Be Listed Below (If Applicable).     If you need a refill on your cardiac medications before your next appointment, please call your pharmacy.

## 2015-11-04 NOTE — Progress Notes (Signed)
Cardiology Office Note   Date:  11/04/2015   ID:  Jonathan York, DOB Dec 05, 1932, MRN [id number]  PCP:  Elsie Stain, MD  Cardiologist:   Kathlyn Sacramento, MD   Chief Complaint  Patient presents with  . other    3 month follow up. Meds reviewed by the patient verbally. "doing well."       History of Present Illness: Jonathan York is a 82 y.o. male who presents for chronic atrial fibrillation. He has chronic medical conditions that include hypertension, hyperlipidemia, type 2 diabetes and carotid disease status post left carotid endarterectomy. He has lung cancer status post resection and chemotherapy in 2002. She was seen recently for asymptomatic atrial fibrillation. Ventricular rate was controlled without any medications. He was noted to be hypertensive and thus I increased the dose of ramipril to 10 mg once daily. I started him on anticoagulation with Eliquis. An echocardiogram was done which showed normal LV systolic function, mild aortic regurgitation and mildly to moderately dilated left atrium with mild pulmonary hypertension. He has been doing well and denies any chest pain, shortness of breath or significant palpitations. He reports no side effects with Eliquis.  Past Medical History  Diagnosis Date  . HLD (hyperlipidemia)   . HTN (hypertension)   . BPH (benign prostatic hypertrophy)   . Cancer ([place])     lung cancer dx'd 2002  . Abdominal bruit 05/05/2012  . Atrial fibrillation ([place])   . Diabetes mellitus type II     diet controlled   . Arthritis     back pain, past lumbar surgery   . Carotid arterial disease [facility])     Past Surgical History  Procedure Laterality Date  . Hemorrhoid surgery  1970  . Cataract extraction      right  . Retinal detachment surgery      right  . Wedge resection  03/15/01    [facility] R[facility]an Hockey)  . Lobectomy  03/15/01    [facility] R[facility] Hockey)  . Spine surgery  approx 2006    Ruptered Disc  . Total knee arthroplasty      x2 per Dr. Rush Farmer    . Eye surgery      Jonathan detached retina- 1990's, IOL implants also   . Tonsillectomy      1939  . Endarterectomy  05/30/2012    Procedure: ENDARTERECTOMY CAROTID;  Surgeon: Elam Dutch, MD;  Location: [place];  Service: Vascular;  Laterality: Left;  . Cardioversion  06/29/2012    Procedure: CARDIOVERSION;  Surgeon: Thayer Headings, MD;  Location: [facility] ENDOSCOPY;  Service: Cardiovascular;  Laterality: N/A;  . Carotid endarterectomy Left 05/30/12  . Joint replacement      knee- bilateral- 2009 and 2012  . Carotid angiogram N/A 05/27/2012    Procedure: CAROTID ANGIOGRAM;  Surgeon: Elam Dutch, MD;  Location: [facility] CATH LAB;  Service: Cardiovascular;  Laterality: N/A;  . Arch aortogram  05/27/2012    Procedure: ARCH AORTOGRAM;  Surgeon: Elam Dutch, MD;  Location: [facility] CATH LAB;  Service: Cardiovascular;;     Current Outpatient Prescriptions  Medication Sig Dispense Refill  . amLODipine (NORVASC) 5 MG tablet TAKE ONE TABLET BY MOUTH AT BEDTIME 90 tablet 2  . apixaban (ELIQUIS) 5 MG TABS tablet Take 1 tablet (5 mg total) by mouth 2 (two) times daily. 60 tablet 3  . atorvastatin (LIPITOR) 80 MG tablet TAKE ONE TABLET BY MOUTH AT BEDTIME 90 tablet 2  . Cinnamon 500 MG capsule Take 500  mg by mouth daily.    Marland Kitchen doxycycline (VIBRA-TABS) 100 MG tablet Take 1 tablet (100 mg total) by mouth 2 (two) times daily. 20 tablet 0  . fish oil-omega-3 fatty acids 1000 MG capsule Take 1 g by mouth 3 (three) times daily.      . methocarbamol (ROBAXIN) 500 MG tablet Take 1 tablet (500 mg total) by mouth at bedtime as needed (for restless legs). 50 tablet 2  . Multiple Vitamins-Minerals (PRESERVISION AREDS 2 PO) Preser Vision Vitamin  Take 2 capsule daily.    . psyllium (REGULOID) 0.52 G capsule Take 0.52 g by mouth daily.    . ramipril (ALTACE) 10 MG capsule Take 1 capsule (10 mg total) by mouth daily. 30 capsule 3  . tamsulosin (FLOMAX) 0.4 MG CAPS capsule TAKE ONE CAPSULE BY MOUTH ONCE DAILY 90 capsule 3    No current facility-administered medications for this visit.    Allergies:   Xarelto    Social History:  The patient  reports that he quit smoking about 33 years ago. His smoking use included Cigarettes. He has a 32 pack-year smoking history. He has never used smokeless tobacco. He reports that he does not drink alcohol or use illicit drugs.   Family History:  The patient's family history includes Dementia in his brother and mother; Diabetes in his mother and son; Emphysema in his father; Heart disease in his son; Hypertension in his brother, mother, and son; Hypothyroidism in his sister; Stroke in his son. There is no history of Prostate cancer or Colon cancer.    ROS:  Please see the history of present illness.   Otherwise, review of systems are positive for none.   All other systems are reviewed and negative.    PHYSICAL EXAM: VS:  BP 130/68 mmHg  Pulse 66  Ht '6\' 3"'$  (1.905 m)  Wt 244 lb 8 oz (110.904 kg)  BMI 30.56 kg/m2  SpO2 98% , BMI Body mass index is 30.56 kg/(m^2). GEN: Well nourished, well developed, in no acute distress HEENT: normal Neck: no JVD, carotid bruits, or masses Cardiac: Irregularly irregular; no murmurs, rubs, or gallops,no edema  Respiratory:  clear to auscultation bilaterally, normal work of breathing GI: soft, nontender, nondistended, + BS MS: no deformity or atrophy Skin: warm and dry, no rash Neuro:  Strength and sensation are intact Psych: euthymic mood, full affect   EKG:  EKG is ordered today. The ekg ordered today demonstrates atrial fibrillation with left anterior fascicular block and possible old septal infarct   Recent Labs: 02/11/2015: ALT 24; BUN 15; Creatinine, Ser 0.95; Potassium 4.4; Sodium 137    Lipid Panel    Component Value Date/Time   CHOL 179 02/11/2015 0953   TRIG 119.0 02/11/2015 0953   HDL 48.00 02/11/2015 0953   CHOLHDL 4 02/11/2015 0953   VLDL 23.8 02/11/2015 0953   LDLCALC 107* 02/11/2015 0953      Wt Readings  from Last 3 Encounters:  11/04/15 244 lb 8 oz (110.904 kg)  09/10/15 246 lb 8 oz (111.812 kg)  08/05/15 246 lb 4 oz (111.698 kg)         ASSESSMENT AND PLAN:  1.  Chronic atrial fibrillation: Ventricular rate is controlled without any medications. He is asymptomatic. He is tolerating anticoagulation with Eliquis with no reported side effects. I requested CBC and basic metabolic profile today.  2. Essential hypertension: Blood pressure improved significantly after increasing the dose of ramipril. He reports no orthostatic dizziness.  3. Hyperlipidemia: Currently on  atorvastatin 80 mg once daily.    Disposition:   FU with me in 6 months  Signed,  Kathlyn Sacramento, MD  11/04/2015 10:08 AM    [place]

## 2015-11-05 LAB — CBC
HEMATOCRIT: 44.5 % (ref 37.5–51.0)
HEMOGLOBIN: 14.9 g/dL (ref 12.6–17.7)
MCH: 28.5 pg (ref 26.6–33.0)
MCHC: 33.5 g/dL (ref 31.5–35.7)
MCV: 85 fL (ref 79–97)
Platelets: 213 10*3/uL (ref 150–379)
RBC: 5.23 x10E6/uL (ref 4.14–5.80)
RDW: 14.7 % (ref 12.3–15.4)
WBC: 6.1 10*3/uL (ref 3.4–10.8)

## 2015-11-05 LAB — BASIC METABOLIC PANEL
BUN / CREAT RATIO: 17 (ref 10–24)
BUN: 13 mg/dL (ref 8–27)
CALCIUM: 9.1 mg/dL (ref 8.6–10.2)
CHLORIDE: 101 mmol/L (ref 96–106)
CO2: 25 mmol/L (ref 18–29)
Creatinine, Ser: 0.78 mg/dL (ref 0.76–1.27)
GFR calc non Af Amer: 85 mL/min/{1.73_m2} (ref >59)
GFR, EST AFRICAN AMERICAN: 99 mL/min/{1.73_m2} (ref >59)
GLUCOSE: 168 mg/dL — AB (ref 65–99)
POTASSIUM: 4.8 mmol/L (ref 3.5–5.2)
Sodium: 139 mmol/L (ref 134–144)

## 2015-11-25 ENCOUNTER — Encounter: Payer: Self-pay | Admitting: Podiatry

## 2015-11-25 ENCOUNTER — Ambulatory Visit (INDEPENDENT_AMBULATORY_CARE_PROVIDER_SITE_OTHER): Payer: Medicare Other | Admitting: Podiatry

## 2015-11-25 VITALS — BP 129/64 | HR 81 | Resp 16

## 2015-11-25 DIAGNOSIS — L603 Nail dystrophy: Secondary | ICD-10-CM | POA: Diagnosis not present

## 2015-11-25 NOTE — Progress Notes (Signed)
   Subjective:    Patient ID: Jonathan York, male    DOB: 06-Mar-1933, 82 y.o.   MRN: [id number]  HPI: He presents today with chief complaint of a painful elongated thickened toenails that he is unable to cut and that is painful with ambulation and shoe gear. He states that it happened a proximally 20 years ago with a large sheet of plywood. He is currently on blood thinner and unable to cut the nail himself.    Review of Systems  All other systems reviewed and are negative.      Objective:   Physical Exam: Vital signs are stable he is alert and oriented 3 pulses are palpable. Neurologic sensorium is symmetrical bilateral. Deep tendon reflexes are intact muscle strength is normal bilateral. Orthopedic evaluationrectus foot type bilateral. No major osseous abnormalities.          Assessment & Plan:  Assessment: Nail dystrophy hallux right.  Plan: Debridement of the nail today hallux right and the nail was smoothed. We did discuss the possibility of removing the nail in the future however he would have to discontinue blood thinner for about 3 days prior to the procedure.

## 2016-01-12 ENCOUNTER — Other Ambulatory Visit: Payer: Self-pay | Admitting: Family Medicine

## 2016-01-14 ENCOUNTER — Encounter: Payer: Self-pay | Admitting: Family Medicine

## 2016-01-14 ENCOUNTER — Ambulatory Visit (INDEPENDENT_AMBULATORY_CARE_PROVIDER_SITE_OTHER): Payer: Medicare Other | Admitting: Family Medicine

## 2016-01-14 VITALS — BP 148/64 | HR 84 | Temp 98.1°F | Wt 242.8 lb

## 2016-01-14 DIAGNOSIS — R059 Cough, unspecified: Secondary | ICD-10-CM

## 2016-01-14 DIAGNOSIS — R05 Cough: Secondary | ICD-10-CM | POA: Diagnosis not present

## 2016-01-14 MED ORDER — DOXYCYCLINE HYCLATE 100 MG PO TABS: 100.0000 mg | ORAL_TABLET | Freq: Two times a day (BID) | ORAL | Status: DC

## 2016-01-14 NOTE — Progress Notes (Signed)
Pre visit review using our clinic review tool, if applicable. No additional management support is needed unless otherwise documented below in the visit note.  Cough.  Started about 3 weeks ago.  Initially thought it was allergies.  He doesn't feel unwell.  Some sputum, usually has a light yellow tint.  Still with sputum production, not better or worse overall (except usually worse when supine).  No fevers.  No vomiting, no diarrhea.  No stuffy nose, no ear pain.  SOB with bending over, at baseline.  Still able to go to the gym, at baseline.  Still bowling twice a week.  Still on baseline meds.   Meds, vitals, and allergies reviewed.   ROS: Per HPI unless specifically indicated in ROS section   GEN: nad, alert and oriented HEENT: mucous membranes moist NECK: supple w/o LA CV: IRR PULM: ctab, no inc wob ABD: soft, +bs EXT: no edema

## 2016-01-14 NOTE — Patient Instructions (Signed)
Start doxy.  Sunburn caution.  Take care.  Glad to see you.  Update me as needed.

## 2016-01-15 NOTE — Assessment & Plan Note (Signed)
Would treat given the duration, d/w pt.  Presumed either atypical PNA or bronchitis, mild at this point based on exam.  Okay for outpatient f/u.  Start doxy, routine cautions, update me as needed.  He agrees.

## 2016-01-28 ENCOUNTER — Encounter: Payer: Self-pay | Admitting: Family Medicine

## 2016-01-28 ENCOUNTER — Ambulatory Visit (INDEPENDENT_AMBULATORY_CARE_PROVIDER_SITE_OTHER): Payer: Medicare Other | Admitting: Family Medicine

## 2016-01-28 VITALS — BP 146/86 | HR 65 | Temp 98.4°F | Wt 243.2 lb

## 2016-01-28 DIAGNOSIS — H6120 Impacted cerumen, unspecified ear: Secondary | ICD-10-CM | POA: Diagnosis not present

## 2016-01-28 NOTE — Progress Notes (Signed)
Pre visit review using our clinic review tool, if applicable. No additional management support is needed unless otherwise documented below in the visit note.  Cough is much better from last OV, almost completely gone.    L ear pain.  He had tried to irrigated L ear but this AM had ear pain.  No FCNAVD.  He had tried to use a qtip- but shallow use, not deep.   Meds, vitals, and allergies reviewed.   ROS: Per HPI unless specifically indicated in ROS section   nad ncat B cerumen impaction, only L side addressed.  Some removed with curette, then with irritation, then with debrox and then more irrigation.  He could tell improvement with partial removal.  Not all removed as patient had irritation (but no sign of infection) on the ear canal, likely where wax had been pressed against the skin chronically, d/w pt.

## 2016-01-28 NOTE — Patient Instructions (Addendum)
Use a few drops of debrox in a few days and then try to irrigate you ear in the shower.   Take care.  Glad to see you.  Go to the lab on the way out.  We'll contact you with your lab report.

## 2016-01-29 ENCOUNTER — Other Ambulatory Visit: Payer: Self-pay | Admitting: Cardiovascular Disease

## 2016-01-29 DIAGNOSIS — H6120 Impacted cerumen, unspecified ear: Secondary | ICD-10-CM | POA: Insufficient documentation

## 2016-01-29 NOTE — Assessment & Plan Note (Signed)
He could tell improvement with partial removal.  Not all removed as patient had irritation (but no sign of infection) on the ear canal, likely where wax had been pressed against the skin chronically, d/w pt.  Continue debrox at home in a few days and then resume home irritation.  Should resolve.  He agrees.

## 2016-02-14 ENCOUNTER — Ambulatory Visit (INDEPENDENT_AMBULATORY_CARE_PROVIDER_SITE_OTHER): Payer: Medicare Other | Admitting: Family Medicine

## 2016-02-14 ENCOUNTER — Encounter: Payer: Self-pay | Admitting: Family Medicine

## 2016-02-14 VITALS — BP 142/70 | HR 68 | Temp 97.8°F | Wt 244.2 lb

## 2016-02-14 DIAGNOSIS — E119 Type 2 diabetes mellitus without complications: Secondary | ICD-10-CM | POA: Diagnosis not present

## 2016-02-14 DIAGNOSIS — Z23 Encounter for immunization: Secondary | ICD-10-CM

## 2016-02-14 DIAGNOSIS — I4891 Unspecified atrial fibrillation: Secondary | ICD-10-CM | POA: Diagnosis not present

## 2016-02-14 DIAGNOSIS — I10 Essential (primary) hypertension: Secondary | ICD-10-CM

## 2016-02-14 NOTE — Patient Instructions (Addendum)
Fasting lab visit when possible.   Take care.  Glad to see you.  Don't change your meds for now.   We'll make plans about your next A1c (after this set next week) when I get your labs resulted.

## 2016-02-14 NOTE — Progress Notes (Signed)
Pre visit review using our clinic review tool, if applicable. No additional management support is needed unless otherwise documented below in the visit note.  Hypertension:    Using medication without problems or lightheadedness: yes Chest pain with exertion:no Edema:no Short of breath:no Some occ L shoulder pain but not exertional.    Prostate cancer screening and PSA options (with potential risks and benefits of testing vs not testing) were discussed along with recent recs/guidelines.  He declined testing PSA at this point.  Colon cancer screening declined, this is reasonable.  No bleeding.   Anticoagulated re: AF.  No ade on med. No bleeding. He'll call about f/u with cards.    He checked on Td coverage, would be just as cheaply here, can get done today.  Done at [place].   Diabetes:  No meds Hypoglycemic episodes:no sx Hyperglycemic episodes: no sx Feet problems: no Blood Sugars averaging: not checked.   eye exam within last year: ~10/2015, no retinopathy.    Meds, vitals, and allergies reviewed.   PMH and SH reviewed  ROS: Per HPI unless specifically indicated in ROS section   GEN: nad, alert and oriented HEENT: mucous membranes moist NECK: supple w/o LA CV: IRR PULM: ctab, no inc wob ABD: soft, +bs EXT: no edema SKIN: no acute rash  Diabetic foot exam: Normal inspection except for small scrape on R 5th toe, doesn't appear infected.   No skin breakdown No calluses  Normal DP pulses Normal sensation to light touch and monofilament Nails normal except for R 1st nail thickened.

## 2016-02-17 ENCOUNTER — Other Ambulatory Visit (INDEPENDENT_AMBULATORY_CARE_PROVIDER_SITE_OTHER): Payer: Medicare Other

## 2016-02-17 DIAGNOSIS — E119 Type 2 diabetes mellitus without complications: Secondary | ICD-10-CM | POA: Diagnosis not present

## 2016-02-17 LAB — COMPREHENSIVE METABOLIC PANEL
ALT: 17 U/L (ref 0–53)
AST: 15 U/L (ref 0–37)
Albumin: 4.2 g/dL (ref 3.5–5.2)
Alkaline Phosphatase: 56 U/L (ref 39–117)
BUN: 12 mg/dL (ref 6–23)
CHLORIDE: 101 meq/L (ref 96–112)
CO2: 30 mEq/L (ref 19–32)
Calcium: 9.5 mg/dL (ref 8.4–10.5)
Creatinine, Ser: 0.9 mg/dL (ref 0.40–1.50)
GFR: 86.14 mL/min (ref >60.00)
GLUCOSE: 149 mg/dL — AB (ref 70–99)
POTASSIUM: 4.6 meq/L (ref 3.5–5.1)
SODIUM: 138 meq/L (ref 135–145)
Total Bilirubin: 1 mg/dL (ref 0.2–1.2)
Total Protein: 7.3 g/dL (ref 6.0–8.3)

## 2016-02-17 LAB — LIPID PANEL
Cholesterol: 168 mg/dL (ref 0–200)
HDL: 49.7 mg/dL (ref >39.00)
LDL CALC: 100 mg/dL — AB (ref 0–99)
NONHDL: 117.82
Total CHOL/HDL Ratio: 3
Triglycerides: 89 mg/dL (ref 0.0–149.0)
VLDL: 17.8 mg/dL (ref 0.0–40.0)

## 2016-02-17 LAB — HEMOGLOBIN A1C: Hgb A1c MFr Bld: 7.5 % — ABNORMAL HIGH (ref 4.6–6.5)

## 2016-02-17 NOTE — Assessment & Plan Note (Signed)
Not tachy, is anticoagulated.  No ade on med. No bleeding. He'll call about f/u with cards.

## 2016-02-17 NOTE — Assessment & Plan Note (Signed)
Reasonable control, continue as is.  D/w pt.  He agrees.  Labs pending.

## 2016-02-17 NOTE — Assessment & Plan Note (Signed)
Continue work on diet and weight.  No change in meds.  See notes on f/u labs.  D/w pt.  He agrees. We'll set f/u when I see his labs.

## 2016-02-18 ENCOUNTER — Telehealth: Payer: Self-pay | Admitting: *Deleted

## 2016-02-18 DIAGNOSIS — Z85118 Personal history of other malignant neoplasm of bronchus and lung: Secondary | ICD-10-CM

## 2016-02-18 NOTE — Telephone Encounter (Signed)
Patient says that he forgot to mention to you at his last OV that he thought he was supposed to have a FU CXR.  Patient says his oncologist recommended it each year.  Patient is scheduled for labs and OV in January 2018.  Does this CXR need to be done prior to that?

## 2016-02-19 NOTE — Addendum Note (Signed)
Addended by: Tonia Ghent on: 02/19/2016 11:25 AM   Modules accepted: Orders

## 2016-02-19 NOTE — Telephone Encounter (Signed)
Yes.  Thanks.  Appreciate the reminder.  I put in the order.  Whenever convenient for patient.

## 2016-02-19 NOTE — Telephone Encounter (Signed)
Patient notified as instructed by telephone and verbalized understanding. Patient stated that he had his last chest xray done last July. Patient stated that he will come in for a chest xray within the next two weeks.

## 2016-03-03 ENCOUNTER — Ambulatory Visit (INDEPENDENT_AMBULATORY_CARE_PROVIDER_SITE_OTHER)
Admission: RE | Admit: 2016-03-03 | Discharge: 2016-03-03 | Disposition: A | Discharge status: Home / Self Care | Payer: Medicare Other | Source: Ambulatory Visit | Attending: Family Medicine | Admitting: Family Medicine

## 2016-03-03 DIAGNOSIS — Z85118 Personal history of other malignant neoplasm of bronchus and lung: Secondary | ICD-10-CM

## 2016-03-03 DIAGNOSIS — R05 Cough: Secondary | ICD-10-CM | POA: Diagnosis not present

## 2016-03-09 ENCOUNTER — Other Ambulatory Visit: Payer: Self-pay

## 2016-03-09 MED ORDER — RAMIPRIL 10 MG PO CAPS: 10.0000 mg | ORAL_CAPSULE | Freq: Every day | ORAL | 3 refills | Status: DC

## 2016-04-14 ENCOUNTER — Other Ambulatory Visit: Payer: Self-pay | Admitting: Cardiovascular Disease

## 2016-04-24 ENCOUNTER — Ambulatory Visit (INDEPENDENT_AMBULATORY_CARE_PROVIDER_SITE_OTHER): Payer: Medicare Other

## 2016-04-24 DIAGNOSIS — Z23 Encounter for immunization: Secondary | ICD-10-CM

## 2016-05-01 ENCOUNTER — Encounter: Payer: Self-pay | Admitting: Cardiovascular Disease

## 2016-05-01 ENCOUNTER — Encounter (INDEPENDENT_AMBULATORY_CARE_PROVIDER_SITE_OTHER): Payer: Self-pay

## 2016-05-01 ENCOUNTER — Ambulatory Visit (INDEPENDENT_AMBULATORY_CARE_PROVIDER_SITE_OTHER): Payer: Medicare Other | Admitting: Cardiovascular Disease

## 2016-05-01 VITALS — BP 190/98 | HR 65 | Ht 75.0 in | Wt 244.4 lb

## 2016-05-01 DIAGNOSIS — I6523 Occlusion and stenosis of bilateral carotid arteries: Secondary | ICD-10-CM | POA: Diagnosis not present

## 2016-05-01 DIAGNOSIS — I4891 Unspecified atrial fibrillation: Secondary | ICD-10-CM | POA: Diagnosis not present

## 2016-05-01 DIAGNOSIS — I10 Essential (primary) hypertension: Secondary | ICD-10-CM

## 2016-05-01 NOTE — Progress Notes (Signed)
Cardiology Office Note   Date:  05/01/2016   ID:  Jonathan York, DOB May 13, 1933, MRN [id number]  PCP:  Elsie Stain, MD  Cardiologist:   Kathlyn Sacramento, MD   Chief Complaint  Patient presents with  . carotid stenosis  . Atrial Fibrillation  . Hypertension      History of Present Illness: Jonathan York is a 82 y.o. male who presents for chronic atrial fibrillation. He has chronic medical conditions that include hypertension, hyperlipidemia, type 2 diabetes and carotid disease status post left carotid endarterectomy. He has lung cancer status post resection and chemotherapy in 2002. Echocardiogram in January 2017 showed normal LV systolic function, mild aortic regurgitation and mildly to moderately dilated left atrium with mild pulmonary hypertension. He has been doing well and denies any chest pain or palpitations. He complains of exertional dyspnea with over exertion but he continues to be active. He reports that he has white coat syndrome and that his blood pressure at home usually is around 428 systolic.  Past Medical History:  Diagnosis Date  . Abdominal bruit 05/05/2012  . Arthritis    back pain, past lumbar surgery   . Atrial fibrillation ([place])   . BPH (benign prostatic hypertrophy)   . Cancer ([place])    lung cancer dx'd 2002  . Carotid arterial disease ([place])   . Diabetes mellitus type II    diet controlled   . HLD (hyperlipidemia)   . HTN (hypertension)     Past Surgical History:  Procedure Laterality Date  . ARCH AORTOGRAM  05/27/2012   Procedure: ARCH AORTOGRAM;  Surgeon: Elam Dutch, MD;  Location: [facility] CATH LAB;  Service: Cardiovascular;;  . CARDIOVERSION  06/29/2012   Procedure: CARDIOVERSION;  Surgeon: Thayer Headings, MD;  Location: [facility] ENDOSCOPY;  Service: Cardiovascular;  Laterality: N/A;  . CAROTID ANGIOGRAM N/A 05/27/2012   Procedure: CAROTID Cyril Loosen;  Surgeon: Elam Dutch, MD;  Location: [facility] CATH LAB;  Service: Cardiovascular;  Laterality: N/A;    . CAROTID ENDARTERECTOMY Left 05/30/12  . CATARACT EXTRACTION     right  . ENDARTERECTOMY  05/30/2012   Procedure: ENDARTERECTOMY CAROTID;  Surgeon: Elam Dutch, MD;  Location: [place];  Service: Vascular;  Laterality: Left;  . EYE SURGERY     Jonathan detached retina- 1990's, IOL implants also   . [place]  . JOINT REPLACEMENT     knee- bilateral- 2009 and 2012  . LOBECTOMY  03/15/01   [facility] R[facility] Hockey)  . RETINAL DETACHMENT SURGERY     right  . SPINE SURGERY  approx 2006   Ruptered Disc  . TONSILLECTOMY     1939  . TOTAL KNEE ARTHROPLASTY     x2 per Dr. Rush Farmer  . WEDGE RESECTION  03/15/01   [facility] Hockey)     Current Outpatient Prescriptions  Medication Sig Dispense Refill  . amLODipine (NORVASC) 5 MG tablet TAKE ONE TABLET BY MOUTH AT BEDTIME 90 tablet 3  . atorvastatin (LIPITOR) 80 MG tablet TAKE ONE TABLET BY MOUTH AT BEDTIME 90 tablet 3  . Cinnamon 500 MG capsule Take 500 mg by mouth daily.    Marland Kitchen ELIQUIS 5 MG TABS tablet TAKE ONE TABLET BY MOUTH TWICE DAILY 60 tablet 3  . fish oil-omega-3 fatty acids 1000 MG capsule Take 1 g by mouth 3 (three) times daily.      . methocarbamol (ROBAXIN) 500 MG tablet Take 1 tablet (500 mg total) by mouth at bedtime as needed (for restless  legs). 50 tablet 2  . Multiple Vitamins-Minerals (PRESERVISION AREDS 2 PO) Preser Vision Vitamin  Take 2 capsule daily.    . psyllium (REGULOID) 0.52 G capsule Take 0.52 g by mouth daily.    . ramipril (ALTACE) 10 MG capsule Take 1 capsule (10 mg total) by mouth daily. 90 capsule 3  . tamsulosin (FLOMAX) 0.4 MG CAPS capsule TAKE ONE CAPSULE BY MOUTH ONCE DAILY 90 capsule 3   No current facility-administered medications for this visit.     Allergies:   Xarelto [rivaroxaban]    Social History:  The patient  reports that he quit smoking about 33 years ago. His smoking use included Cigarettes. He has a 32.00 pack-year smoking history. He has never used smokeless tobacco. He reports  that he does not drink alcohol or use drugs.   Family History:  The patient's family history includes Dementia in his brother and mother; Diabetes in his mother and son; Emphysema in his father; Heart disease in his son; Hypertension in his brother, mother, and son; Hypothyroidism in his sister; Stroke in his son.    ROS:  Please see the history of present illness.   Otherwise, review of systems are positive for none.   All other systems are reviewed and negative.    PHYSICAL EXAM: VS:  BP (!) 190/98   Pulse 65   Ht '6\' 3"'$  (1.905 m)   Wt 244 lb 6.4 oz (110.9 kg)   SpO2 97%   BMI 30.55 kg/m  , BMI Body mass index is 30.55 kg/m. GEN: Well nourished, well developed, in no acute distress HEENT: normal Neck: no JVD, carotid bruits, or masses Cardiac: Irregularly irregular; no murmurs, rubs, or gallops,no edema  Respiratory:  clear to auscultation bilaterally, normal work of breathing GI: soft, nontender, nondistended, + BS MS: no deformity or atrophy Skin: warm and dry, no rash Neuro:  Strength and sensation are intact Psych: euthymic mood, full affect   EKG:  EKG is ordered today. The ekg ordered today demonstrates atrial fibrillation with left anterior fascicular block and possible old septal infarct   Recent Labs: 11/04/2015: Platelets 213 02/17/2016: ALT 17; BUN 12; Creatinine, Ser 0.90; Potassium 4.6; Sodium 138    Lipid Panel    Component Value Date/Time   CHOL 168 02/17/2016 0831   TRIG 89.0 02/17/2016 0831   HDL 49.70 02/17/2016 0831   CHOLHDL 3 02/17/2016 0831   VLDL 17.8 02/17/2016 0831   LDLCALC 100 (H) 02/17/2016 0831      Wt Readings from Last 3 Encounters:  05/01/16 244 lb 6.4 oz (110.9 kg)  02/14/16 244 lb 4 oz (110.8 kg)  01/28/16 243 lb 4 oz (110.3 kg)         ASSESSMENT AND PLAN:  1.  Chronic atrial fibrillation: Ventricular rate is controlled without any medications. He is asymptomatic. He is tolerating anticoagulation with Eliquis with no  reported side effects.  2. Essential hypertension:  Blood pressure is significantly elevated but reports control his blood pressure readings at home. I asked him to monitor his blood pressure at home over the next week and call us back with readings. We can consider adding a small dose thiazide diuretic.  3. Hyperlipidemia: Currently on atorvastatin 80 mg once daily.    Disposition:   FU with me in 6 months  Signed,  Kathlyn Sacramento, MD  05/01/2016 9:05 AM    [place]

## 2016-05-01 NOTE — Patient Instructions (Signed)
Medication Instructions:  Your physician recommends that you continue on your current medications as directed. Please refer to the Current Medication list given to you today.   Labwork: none  Testing/Procedures: none  Follow-Up: Your physician wants you to follow-up in: six months with Dr. Fletcher Anon.  You will receive a reminder letter in the mail two months in advance. If you don't receive a letter, please call our office to schedule the follow-up appointment.   Any Other Special Instructions Will Be Listed Below (If Applicable). Please check your blood pressure daily for one week and call with the readings.      If you need a refill on your cardiac medications before your next appointment, please call your pharmacy.

## 2016-05-08 ENCOUNTER — Other Ambulatory Visit: Payer: Self-pay

## 2016-05-08 ENCOUNTER — Telehealth: Payer: Self-pay | Admitting: Cardiovascular Disease

## 2016-05-08 MED ORDER — AMLODIPINE BESYLATE 5 MG PO TABS: 10.0000 mg | ORAL_TABLET | Freq: Every day | ORAL | 1 refills | Status: DC

## 2016-05-08 NOTE — Telephone Encounter (Signed)
BP Readings per patient  Sat  10/14  160/80  Sun  10/15  130/70   mon  10/16  126/80  tues  10/17   160/70    Wed  10/18   170/80  thurs  10/19   130/80  fri  10/20   140/78

## 2016-05-08 NOTE — Telephone Encounter (Signed)
Increase amlodipine to 10 mg daily. If BP continues to run on the higher side can add low-dose thiazide diuretic.

## 2016-05-08 NOTE — Telephone Encounter (Signed)
Pt BP 190/78 @ 10/13 OV. Pt instructed to monitor x 1 week and call w/readings (see below) Pt takes amlodipine '5mg'$  qd and ramipril '10mg'$  qd. Forward to [employer], PA-C as Dr. Fletcher Anon is away.

## 2016-05-08 NOTE — Telephone Encounter (Signed)
Reviewed recommendations w/pt who verbalized understanding. He will continue to monitor BP and report if BP continues to be elevated. New prescription submitted.

## 2016-06-05 ENCOUNTER — Ambulatory Visit (INDEPENDENT_AMBULATORY_CARE_PROVIDER_SITE_OTHER): Payer: Medicare Other | Admitting: Internal Medicine

## 2016-06-05 ENCOUNTER — Encounter: Payer: Self-pay | Admitting: Internal Medicine

## 2016-06-05 VITALS — BP 170/90 | HR 80 | Temp 97.5°F | Wt 244.0 lb

## 2016-06-05 DIAGNOSIS — H1033 Unspecified acute conjunctivitis, bilateral: Secondary | ICD-10-CM

## 2016-06-05 MED ORDER — SULFACETAMIDE SODIUM 10 % OP SOLN: 2.0000 [drp] | Freq: Four times a day (QID) | OPHTHALMIC | 0 refills | Status: DC

## 2016-06-05 NOTE — Progress Notes (Signed)
Pre visit review using our clinic review tool, if applicable. No additional management support is needed unless otherwise documented below in the visit note. 

## 2016-06-05 NOTE — Assessment & Plan Note (Signed)
Going on a week Now with increased discharge May be bacterial Will treat with sulfa----opto if not better by 3 days

## 2016-06-05 NOTE — Patient Instructions (Signed)
Please see your eye doctor if your eyes aren't improved by Monday

## 2016-06-05 NOTE — Progress Notes (Signed)
Subjective:    Patient ID: Jonathan York, male    DOB: 1933/01/02, 82 y.o.   MRN: [id number]  HPI Here due to eye problems Started last week All bloodshot and red Left eye was crusted shut this morning  Felt it might have started after smoke exposure at a pig pickin  No fever Hasn't tried anything Keeps up with eye doctor -- last April or May  Current Outpatient Prescriptions on File Prior to Visit  Medication Sig Dispense Refill  . amLODipine (NORVASC) 5 MG tablet Take 2 tablets (10 mg total) by mouth daily. 180 tablet 1  . atorvastatin (LIPITOR) 80 MG tablet TAKE ONE TABLET BY MOUTH AT BEDTIME 90 tablet 3  . Cinnamon 500 MG capsule Take 500 mg by mouth daily.    Marland Kitchen ELIQUIS 5 MG TABS tablet TAKE ONE TABLET BY MOUTH TWICE DAILY 60 tablet 3  . fish oil-omega-3 fatty acids 1000 MG capsule Take 1 g by mouth 3 (three) times daily.      . methocarbamol (ROBAXIN) 500 MG tablet Take 1 tablet (500 mg total) by mouth at bedtime as needed (for restless legs). 50 tablet 2  . Multiple Vitamins-Minerals (PRESERVISION AREDS 2 PO) Preser Vision Vitamin  Take 2 capsule daily.    . psyllium (REGULOID) 0.52 G capsule Take 0.52 g by mouth daily.    . ramipril (ALTACE) 10 MG capsule Take 1 capsule (10 mg total) by mouth daily. 90 capsule 3  . tamsulosin (FLOMAX) 0.4 MG CAPS capsule TAKE ONE CAPSULE BY MOUTH ONCE DAILY 90 capsule 3   No current facility-administered medications on file prior to visit.     Allergies  Allergen Reactions  . Xarelto [Rivaroxaban] Other (See Comments)    vertigo    Past Medical History:  Diagnosis Date  . Abdominal bruit 05/05/2012  . Arthritis    back pain, past lumbar surgery   . Atrial fibrillation ([place])   . BPH (benign prostatic hypertrophy)   . Cancer ([place])    lung cancer dx'd 2002  . Carotid arterial disease ([place])   . Diabetes mellitus type II    diet controlled   . HLD (hyperlipidemia)   . HTN (hypertension)     Past Surgical History:  Procedure  Laterality Date  . ARCH AORTOGRAM  05/27/2012   Procedure: ARCH AORTOGRAM;  Surgeon: Elam Dutch, MD;  Location: [facility] CATH LAB;  Service: Cardiovascular;;  . CARDIOVERSION  06/29/2012   Procedure: CARDIOVERSION;  Surgeon: Thayer Headings, MD;  Location: [facility] ENDOSCOPY;  Service: Cardiovascular;  Laterality: N/A;  . CAROTID ANGIOGRAM N/A 05/27/2012   Procedure: CAROTID Cyril Loosen;  Surgeon: Elam Dutch, MD;  Location: [facility] CATH LAB;  Service: Cardiovascular;  Laterality: N/A;  . CAROTID ENDARTERECTOMY Left 05/30/12  . CATARACT EXTRACTION     right  . ENDARTERECTOMY  05/30/2012   Procedure: ENDARTERECTOMY CAROTID;  Surgeon: Elam Dutch, MD;  Location: [place];  Service: Vascular;  Laterality: Left;  . EYE SURGERY     R detached retina- 1990's, IOL implants also   . [place]  . JOINT REPLACEMENT     knee- bilateral- 2009 and 2012  . LOBECTOMY  03/15/01   LUL Roxan Hockey)  . RETINAL DETACHMENT SURGERY     right  . SPINE SURGERY  approx 2006   Ruptered Disc  . TONSILLECTOMY     1939  . TOTAL KNEE ARTHROPLASTY     x2 per Dr. Rush Farmer  . WEDGE RESECTION  03/15/01   LUL Roxan Hockey)    Family History  Problem Relation Age of Onset  . Emphysema Father   . Dementia Mother   . Diabetes Mother   . Hypertension Mother   . Hypothyroidism Sister   . Dementia Brother   . Hypertension Brother   . Diabetes Son   . Heart disease Son     Heart Disease before age 78- Open Heart 2010  . Hypertension Son   . Stroke Son     while on coumadin  . Prostate cancer Neg Hx   . Colon cancer Neg Hx     Social History   Social History  . Marital status: Married    Spouse name: N/A  . Number of children: 3  . Years of education: N/A   Occupational History  . Fired then [employer]    Social History Main Topics  . Smoking status: Former Smoker    Packs/day: 1.00    Years: 32.00    Types: Cigarettes    Quit date: 09/03/1982  . Smokeless tobacco:  Never Used  . Alcohol use No     Comment: quit 2003 w exception of wine in winter (a little)   . Drug use: No  . Sexual activity: Yes   Other Topics Concern  . Not on file   Social History Narrative   Retired Personal assistant   From [employer]   Married happily since 1964   Review of Systems Some chronic cough No URI symptoms like runny nose or congestion    Objective:   Physical Exam  Constitutional: He appears well-developed and well-nourished. No distress.  Eyes: Left eye exhibits discharge. Scleral icterus is present.  Conjunctival and scleral injection Mild discharge in lashes No foreign body          Assessment & Plan:

## 2016-06-15 DIAGNOSIS — H11152 Pinguecula, left eye: Secondary | ICD-10-CM | POA: Diagnosis not present

## 2016-06-15 DIAGNOSIS — H35341 Macular cyst, hole, or pseudohole, right eye: Secondary | ICD-10-CM | POA: Diagnosis not present

## 2016-06-15 DIAGNOSIS — Z961 Presence of intraocular lens: Secondary | ICD-10-CM | POA: Diagnosis not present

## 2016-06-15 DIAGNOSIS — H1032 Unspecified acute conjunctivitis, left eye: Secondary | ICD-10-CM | POA: Diagnosis not present

## 2016-06-15 DIAGNOSIS — H5211 Myopia, right eye: Secondary | ICD-10-CM | POA: Diagnosis not present

## 2016-07-20 ENCOUNTER — Other Ambulatory Visit: Payer: Self-pay | Admitting: Family Medicine

## 2016-07-20 DIAGNOSIS — E119 Type 2 diabetes mellitus without complications: Secondary | ICD-10-CM

## 2016-07-24 ENCOUNTER — Other Ambulatory Visit (INDEPENDENT_AMBULATORY_CARE_PROVIDER_SITE_OTHER): Payer: Medicare Other

## 2016-07-24 ENCOUNTER — Other Ambulatory Visit: Payer: Self-pay | Admitting: *Deleted

## 2016-07-24 DIAGNOSIS — E119 Type 2 diabetes mellitus without complications: Secondary | ICD-10-CM

## 2016-07-24 LAB — HEMOGLOBIN A1C: HEMOGLOBIN A1C: 7.7 % — AB (ref 4.6–6.5)

## 2016-07-24 MED ORDER — TAMSULOSIN HCL 0.4 MG PO CAPS: 0.4000 mg | ORAL_CAPSULE | Freq: Every day | ORAL | 3 refills | Status: DC

## 2016-07-28 ENCOUNTER — Encounter: Payer: Self-pay | Admitting: Family Medicine

## 2016-07-28 ENCOUNTER — Ambulatory Visit (INDEPENDENT_AMBULATORY_CARE_PROVIDER_SITE_OTHER): Payer: Medicare Other | Admitting: Family Medicine

## 2016-07-28 DIAGNOSIS — I10 Essential (primary) hypertension: Secondary | ICD-10-CM

## 2016-07-28 DIAGNOSIS — E119 Type 2 diabetes mellitus without complications: Secondary | ICD-10-CM | POA: Diagnosis not present

## 2016-07-28 NOTE — Assessment & Plan Note (Signed)
Recheck labs before a visit in about 6 months.  Work on diet and exercise.  No meds at this point.  Goal A1c ~7.5.  I don't want to induce hypoglycemia.  He agrees.

## 2016-07-28 NOTE — Assessment & Plan Note (Signed)
Work on diet and exercise.  No change in meds at this point.  I don't want to induce hypotension.  He agrees.

## 2016-07-28 NOTE — Patient Instructions (Addendum)
Recheck labs before a visit in about 6 months.   Call about routine heart clinic appointment.  Take care.  Glad to see you.  Keep working on [employer].

## 2016-07-28 NOTE — Progress Notes (Signed)
Pre visit review using our clinic review tool, if applicable. No additional management support is needed unless otherwise documented below in the visit note. 

## 2016-07-28 NOTE — Progress Notes (Signed)
Diabetes:  Using medications without difficulties: no meds other than cinnamon.  Hypoglycemic episodes: no sx Hyperglycemic episodes: no sx Feet problems: no Blood Sugars averaging: not checked eye exam within last year: due 2018, d/w pt.   A1c d/w pt. Goal A1c ~7.5.  Would try to avoid low sugars and aggressive tx.   He is back on diet after new years.   Hypertension:    Using medication without problems or lightheadedness: yes Chest pain with exertion:no Edema:no Short of breath:related to weight gain with bending over, at baseline.   Reasonable to continue meds as is.  PMH and SH reviewed  Meds, vitals, and allergies reviewed.   ROS: Per HPI unless specifically indicated in ROS section   GEN: nad, alert and oriented HEENT: mucous membranes moist NECK: supple w/o LA CV: IRR, not tachy PULM: ctab, no inc wob ABD: soft, +bs EXT: no edema  Diabetic foot exam: Normal inspection No skin breakdown No calluses  Normal DP pulses Normal sensation to light touch and monofilament Nails normal except for R1st nail chronically thickened.

## 2016-08-07 ENCOUNTER — Ambulatory Visit (INDEPENDENT_AMBULATORY_CARE_PROVIDER_SITE_OTHER): Payer: Self-pay | Admitting: Ophthalmology

## 2016-08-14 ENCOUNTER — Ambulatory Visit (INDEPENDENT_AMBULATORY_CARE_PROVIDER_SITE_OTHER): Payer: Self-pay | Admitting: Ophthalmology

## 2016-08-27 ENCOUNTER — Ambulatory Visit (INDEPENDENT_AMBULATORY_CARE_PROVIDER_SITE_OTHER): Payer: Medicare Other | Admitting: Ophthalmology

## 2016-09-11 ENCOUNTER — Ambulatory Visit: Payer: Medicare Other | Admitting: Family Medicine

## 2016-09-26 ENCOUNTER — Other Ambulatory Visit: Payer: Self-pay | Admitting: Cardiovascular Disease

## 2016-09-28 NOTE — Telephone Encounter (Signed)
Pt needs f/u appt with Arida. Thanks!

## 2016-10-02 ENCOUNTER — Ambulatory Visit (INDEPENDENT_AMBULATORY_CARE_PROVIDER_SITE_OTHER): Payer: Medicare Other | Admitting: Ophthalmology

## 2016-10-02 DIAGNOSIS — I10 Essential (primary) hypertension: Secondary | ICD-10-CM | POA: Diagnosis not present

## 2016-10-02 DIAGNOSIS — E113293 Type 2 diabetes mellitus with mild nonproliferative diabetic retinopathy without macular edema, bilateral: Secondary | ICD-10-CM | POA: Diagnosis not present

## 2016-10-02 DIAGNOSIS — H35033 Hypertensive retinopathy, bilateral: Secondary | ICD-10-CM | POA: Diagnosis not present

## 2016-10-02 DIAGNOSIS — H353132 Nonexudative age-related macular degeneration, bilateral, intermediate dry stage: Secondary | ICD-10-CM

## 2016-10-02 DIAGNOSIS — E11319 Type 2 diabetes mellitus with unspecified diabetic retinopathy without macular edema: Secondary | ICD-10-CM | POA: Diagnosis not present

## 2016-10-02 DIAGNOSIS — H43813 Vitreous degeneration, bilateral: Secondary | ICD-10-CM

## 2016-10-02 DIAGNOSIS — H338 Other retinal detachments: Secondary | ICD-10-CM | POA: Diagnosis not present

## 2016-10-02 LAB — HM DIABETES EYE EXAM

## 2016-10-13 ENCOUNTER — Encounter: Payer: Self-pay | Admitting: Family Medicine

## 2016-10-29 ENCOUNTER — Other Ambulatory Visit: Payer: Self-pay | Admitting: Cardiovascular Disease

## 2016-11-02 ENCOUNTER — Ambulatory Visit (INDEPENDENT_AMBULATORY_CARE_PROVIDER_SITE_OTHER): Payer: Medicare Other | Admitting: Cardiovascular Disease

## 2016-11-02 ENCOUNTER — Encounter: Payer: Self-pay | Admitting: Cardiovascular Disease

## 2016-11-02 VITALS — BP 132/70 | HR 70 | Ht 76.0 in | Wt 248.0 lb

## 2016-11-02 DIAGNOSIS — I482 Chronic atrial fibrillation, unspecified: Secondary | ICD-10-CM

## 2016-11-02 DIAGNOSIS — R55 Syncope and collapse: Secondary | ICD-10-CM | POA: Diagnosis not present

## 2016-11-02 DIAGNOSIS — I10 Essential (primary) hypertension: Secondary | ICD-10-CM

## 2016-11-02 NOTE — Patient Instructions (Signed)
Medication Instructions: Continue same medications.   Labwork: None.   Procedures/Testing: None.   Follow-Up: 6 months with Dr. Kennedy Bohanon.   Any Additional Special Instructions Will Be Listed Below (If Applicable).     If you need a refill on your cardiac medications before your next appointment, please call your pharmacy.   

## 2016-11-02 NOTE — Progress Notes (Signed)
Cardiology Office Note   Date:  11/02/2016   ID:  Jonathan York, DOB 04-09-1933, MRN [id number]  PCP:  Elsie Stain, MD  Cardiologist:   Kathlyn Sacramento, MD   Chief Complaint  Patient presents with  . other    6 month follow up. Patient c/o passing out 11/01/16. Meds reviewed verbally with patient.       History of Present Illness: Jonathan York is a 83 y.o. male who presents for chronic atrial fibrillation. He has chronic medical conditions that include hypertension, hyperlipidemia, type 2 diabetes and carotid disease status post left carotid endarterectomy. He has lung cancer status post resection and chemotherapy in 2002. Echocardiogram in January 2017 showed normal LV systolic function, mild aortic regurgitation and mildly to moderately dilated left atrium with mild pulmonary hypertension. During his most recent follow-up, the dose of amlodipine was increased to 10 mg daily for blood pressure control. He has been doing very wellWith no chest pain, shortness of breath, palpitations or dizziness. Yesterday, while he was taking his medications, one of the medications gets stuck and he could not swallow. He had brief loss of consciousness with no significant injuries. The duration was likely brief as he noted that the same TV commercial was still on when he woke up. He reports having a prior similar episode years ago when he had difficulty swallowing one medication as well.   Past Medical History:  Diagnosis Date  . Abdominal bruit 05/05/2012  . Arthritis    back pain, past lumbar surgery   . Atrial fibrillation ([place])   . BPH (benign prostatic hypertrophy)   . Cancer ([place])    lung cancer dx'd 2002  . Carotid arterial disease ([place])   . Diabetes mellitus type II    diet controlled   . HLD (hyperlipidemia)   . HTN (hypertension)     Past Surgical History:  Procedure Laterality Date  . ARCH AORTOGRAM  05/27/2012   Procedure: ARCH AORTOGRAM;  Surgeon: Elam Dutch, MD;   Location: [facility] CATH LAB;  Service: Cardiovascular;;  . CARDIOVERSION  06/29/2012   Procedure: CARDIOVERSION;  Surgeon: Thayer Headings, MD;  Location: [facility] ENDOSCOPY;  Service: Cardiovascular;  Laterality: N/A;  . CAROTID ANGIOGRAM N/A 05/27/2012   Procedure: CAROTID Cyril Loosen;  Surgeon: Elam Dutch, MD;  Location[facility] CATH LAB;  Service: Cardiovascular;  Laterality: N/A;  . CAROTID ENDARTERECTOMY Left 05/30/12  . CATARACT EXTRACTION     right  . ENDARTERECTOMY  05/30/2012   Procedure: ENDARTERECTOMY CAROTID;  Surgeon: Elam Dutch, MD;  Location: [place];  Service: Vascular;  Laterality: Left;  . EYE SURGERY     Jonathan detached retina- 1990's, IOL implants also   . [place]  . JOINT REPLACEMENT     knee- bilateral- 2009 and 2012  . LOBECTOMY  03/15/01   LUL Roxan Hockey)  . RETINAL DETACHMENT SURGERY     right  . SPINE SURGERY  approx 2006   Ruptered Disc  . TONSILLECTOMY     1939  . TOTAL KNEE ARTHROPLASTY     x2 per Dr. Rush Farmer  . WEDGE RESECTION  03/15/01   LUL Roxan Hockey)     Current Outpatient Prescriptions  Medication Sig Dispense Refill  . atorvastatin (LIPITOR) 80 MG tablet TAKE ONE TABLET BY MOUTH AT BEDTIME 90 tablet 3  . Cinnamon 500 MG capsule Take 500 mg by mouth daily.    Marland Kitchen ELIQUIS 5 MG TABS tablet TAKE 1 TABLET BY MOUTH TWICE  DAILY 180 tablet 0  . fish oil-omega-3 fatty acids 1000 MG capsule Take 1 g by mouth 3 (three) times daily.      . methocarbamol (ROBAXIN) 500 MG tablet Take 1 tablet (500 mg total) by mouth at bedtime as needed (for restless legs). 50 tablet 2  . Multiple Vitamins-Minerals (PRESERVISION AREDS 2 PO) Preser Vision Vitamin  Take 2 capsule daily.    . psyllium (REGULOID) 0.52 G capsule Take 0.52 g by mouth daily.    . ramipril (ALTACE) 10 MG capsule Take 1 capsule (10 mg total) by mouth daily. 90 capsule 3  . sulfacetamide (BLEPH-10) 10 % ophthalmic solution Place 2 drops into both eyes 4 (four) times daily. 15 mL 0  .  tamsulosin (FLOMAX) 0.4 MG CAPS capsule Take 1 capsule (0.4 mg total) by mouth daily. 90 capsule 3  . amLODipine (NORVASC) 5 MG tablet Take 2 tablets (10 mg total) by mouth daily. 180 tablet 1   No current facility-administered medications for this visit.     Allergies:   Xarelto [rivaroxaban]    Social History:  The patient  reports that he quit smoking about 34 years ago. His smoking use included Cigarettes. He has a 32.00 pack-year smoking history. He has never used smokeless tobacco. He reports that he does not drink alcohol or use drugs.   Family History:  The patient's family history includes Dementia in his brother and mother; Diabetes in his mother and son; Emphysema in his father; Heart disease in his son; Hypertension in his brother, mother, and son; Hypothyroidism in his sister; Stroke in his son.    ROS:  Please see the history of present illness.   Otherwise, review of systems are positive for none.   All other systems are reviewed and negative.    PHYSICAL EXAM: VS:  BP 132/70 (BP Location: Left Arm, Patient Position: Sitting, Cuff Size: Normal)   Pulse 70   Ht '6\' 4"'$  (1.93 m)   Wt 248 lb (112.5 kg)   BMI 30.19 kg/m  , BMI Body mass index is 30.19 kg/m. GEN: Well nourished, well developed, in no acute distress  HEENT: normal  Neck: no JVD, carotid bruits, or masses Cardiac: Irregularly irregular; no murmurs, rubs, or gallops, trace edema  Respiratory:  clear to auscultation bilaterally, normal work of breathing GI: soft, nontender, nondistended, + BS MS: no deformity or atrophy  Skin: warm and dry, no rash Neuro:  Strength and sensation are intact Psych: euthymic mood, full affect   EKG:  EKG is ordered today. The ekg ordered today demonstrates atrial fibrillation with left anterior fascicular block and possible old septal infarct   Recent Labs: 11/04/2015: Platelets 213 02/17/2016: ALT 17; BUN 12; Creatinine, Ser 0.90; Potassium 4.6; Sodium 138    Lipid  Panel    Component Value Date/Time   CHOL 168 02/17/2016 0831   TRIG 89.0 02/17/2016 0831   HDL 49.70 02/17/2016 0831   CHOLHDL 3 02/17/2016 0831   VLDL 17.8 02/17/2016 0831   LDLCALC 100 (H) 02/17/2016 0831      Wt Readings from Last 3 Encounters:  11/02/16 248 lb (112.5 kg)  07/28/16 247 lb 12 oz (112.4 kg)  06/05/16 244 lb (110.7 kg)         ASSESSMENT AND PLAN:  1.  Chronic atrial fibrillation: Ventricular rate is controlled without any medications. He is asymptomatic. He is tolerating anticoagulation with Eliquis with no reported side effects. I recommend checking CBC and basic metabolic profile with his  next physical.  2. Vasovagal syncope: The patient had an episode of syncope yesterday in the setting of difficulty swallowing his medication. His exam is unremarkable and the episode is highly suggestive of vasovagal syncope. Thus, no further workup is needed.  3. Essential hypertension:  Blood pressure improved after increasing amlodipine to 10 mg daily.  3. Hyperlipidemia: Currently on atorvastatin 80 mg once daily.    Disposition:   FU with me in 6 months  Signed,  Kathlyn Sacramento, MD  11/02/2016 1:02 PM    [place]

## 2016-11-03 DIAGNOSIS — Z961 Presence of intraocular lens: Secondary | ICD-10-CM | POA: Diagnosis not present

## 2016-11-03 DIAGNOSIS — H5202 Hypermetropia, left eye: Secondary | ICD-10-CM | POA: Diagnosis not present

## 2016-11-03 DIAGNOSIS — H35341 Macular cyst, hole, or pseudohole, right eye: Secondary | ICD-10-CM | POA: Diagnosis not present

## 2016-11-03 DIAGNOSIS — H5211 Myopia, right eye: Secondary | ICD-10-CM | POA: Diagnosis not present

## 2016-11-03 DIAGNOSIS — H33001 Unspecified retinal detachment with retinal break, right eye: Secondary | ICD-10-CM | POA: Diagnosis not present

## 2017-01-18 ENCOUNTER — Telehealth: Payer: Self-pay | Admitting: Family Medicine

## 2017-01-18 NOTE — Telephone Encounter (Signed)
Noted. Thanks.

## 2017-01-18 NOTE — Telephone Encounter (Signed)
Patient Name: Jonathan York  DOB: 07-09-1933    Initial Comment Caller states he's having tightness in his chest.   Nurse Assessment  Nurse: Leilani Merl, RN, [place] Date/Time (Eastern Time): 01/18/2017 9:28:46 AM  Confirm and document reason for call. If symptomatic, describe symptoms. ---Caller states he's having tightness in his chest when he gets up in the mornings for the last 3 weeks, he has had previous lung cancer  Does the patient have any new or worsening symptoms? ---Yes  Will a triage be completed? ---Yes  Related visit to physician within the last 2 weeks? ---No  Does the PT have any chronic conditions? (i.e. diabetes, asthma, etc.) ---Yes  List chronic conditions. ---See MR  Is this a behavioral health or substance abuse call? ---No     Guidelines    Guideline Title Affirmed Question Affirmed Notes  Chest Pain Chest pain lasts > 5 minutes (Exceptions: chest pain occurring > 3 days ago and now asymptomatic; same as previously diagnosed heartburn and has accompanying sour taste in mouth)    Final Disposition User   Go to ED Now (or PCP triage) Leilani Merl, RN, [name] states that he just wants an appt with his doctor at some point, he is not going to see anyone else.   Referrals  GO TO FACILITY REFUSED   Disagree/Comply: Disagree  Disagree/Comply Reason: Disagree with instructions

## 2017-01-18 NOTE — Telephone Encounter (Signed)
I spoke with pt and the tightness in chest is each morning for last few weeks; tightness is usually gone by 10 AM each morning. Pt does not want to go to another [facility] office or ED. Pt scheduled appt with Dr Damita Dunnings on 01/19/17 at 12:15 and if pt condition worsens or has CP or SOB pt will go to ED prior to appt.

## 2017-01-18 NOTE — Telephone Encounter (Signed)
[facility] called   Pt was triaged to go to ED for chest tightness and pt refused. TH is sending over report  Pt phone number is [phone] Thanks

## 2017-01-19 ENCOUNTER — Encounter: Payer: Self-pay | Admitting: Family Medicine

## 2017-01-19 ENCOUNTER — Ambulatory Visit (INDEPENDENT_AMBULATORY_CARE_PROVIDER_SITE_OTHER): Payer: Medicare Other | Admitting: Family Medicine

## 2017-01-19 DIAGNOSIS — K219 Gastro-esophageal reflux disease without esophagitis: Secondary | ICD-10-CM

## 2017-01-19 MED ORDER — OMEPRAZOLE 20 MG PO CPDR: 20.0000 mg | DELAYED_RELEASE_CAPSULE | Freq: Every day | ORAL | 3 refills | Status: DC

## 2017-01-19 MED ORDER — METHOCARBAMOL 500 MG PO TABS: 500.0000 mg | ORAL_TABLET | Freq: Every evening | ORAL | 2 refills | Status: DC | PRN

## 2017-01-19 NOTE — Patient Instructions (Signed)
Try elevating the head of your bed.  Try taking prilosec once a day.   Update me if worse and we can set you up with GI.  Take care.  Glad to see you.  Likely reflux and occasional esophageal spasm.

## 2017-01-19 NOTE — Progress Notes (Signed)
Needed refill on methocarbamol for RLS.  He hadn't tried other meds since this worked well w/o ADE.  Refill done at [facility].    Chest tightness. Noted in the AM when getting up, early in the AM.  More recently with feeling of food sticking in the esophagus, especially lettuce.  It used to happen when he would drink beer but that got better when he quit drinking beer, until sx returned more recently.  Not drinking etoh.  No vomiting unless food gets stuck in the esophagus.  He has some cough with the episodes.  He admits to being a fast eater.    The AM sx are daily, but the sticking sx are episodic and not daily.   No blood in vomiting, no blood in stool.  He feels well o/w.  No exertional sx of any variety.  He can go to the gym and exercise w/o troubles.  No recent OTC meds tried.  He has h/o esophageal dilation.    Meds, vitals, and allergies reviewed.   ROS: Per HPI unless specifically indicated in ROS section   GEN: nad, alert and oriented HEENT: mucous membranes moist NECK: supple w/o LA CV: rrr. PULM: ctab, no inc wob ABD: soft, +bs EXT: no edema SKIN: no acute rash

## 2017-01-21 ENCOUNTER — Other Ambulatory Visit: Payer: Self-pay | Admitting: Cardiovascular Disease

## 2017-01-21 DIAGNOSIS — K219 Gastro-esophageal reflux disease without esophagitis: Secondary | ICD-10-CM | POA: Insufficient documentation

## 2017-01-21 NOTE — Assessment & Plan Note (Signed)
Likely GERD symptoms overnight causing some discomfort in the morning. He also likely has esophageal spasm occasionally noted. That could be GERD induced. Discussed with patient. He does not have exertional symptoms. This does not appear to be a cardiac issue. Start Prilosec, elevate head of bed and update [place] if not better so we can refer him to GI. He may end up needing repeat esophageal dilation. Okay for outpatient follow-up. He agrees with plan.

## 2017-02-08 ENCOUNTER — Other Ambulatory Visit: Payer: Self-pay | Admitting: Family Medicine

## 2017-02-08 DIAGNOSIS — E119 Type 2 diabetes mellitus without complications: Secondary | ICD-10-CM

## 2017-02-10 NOTE — Progress Notes (Deleted)
PCP notes:   Health maintenance:   Abnormal screenings:    Patient concerns:    Nurse concerns:   Next PCP appt: 02/18/2017

## 2017-02-10 NOTE — Progress Notes (Deleted)
Subjective:   Jonathan York is a 83 y.o. male who presents for Medicare Annual/Subsequent preventive examination.  Review of Systems:  No ROS.  Medicare Wellness Visit. Additional risk factors are reflected in the social history.        Objective:    Vitals: There were no vitals taken for this visit.  There is no height or weight on file to calculate BMI.  Tobacco History  Smoking Status  . Former Smoker  . Packs/day: 1.00  . Years: 32.00  . Types: Cigarettes  . Quit date: 09/03/1982  Smokeless Tobacco  . Never Used     Counseling given: Not Answered   Past Medical History:  Diagnosis Date  . Abdominal bruit 05/05/2012  . Arthritis    back pain, past lumbar surgery   . Atrial fibrillation ([place])   . BPH (benign prostatic hypertrophy)   . Cancer ([place])    lung cancer dx'd 2002  . Carotid arterial disease ([place])   . Diabetes mellitus type II    diet controlled   . HLD (hyperlipidemia)   . HTN (hypertension)    Past Surgical History:  Procedure Laterality Date  . ARCH AORTOGRAM  05/27/2012   Procedure: ARCH AORTOGRAM;  Surgeon: Elam Dutch, MD;  Location: [facility] CATH LAB;  Service: Cardiovascular;;  . CARDIOVERSION  06/29/2012   Procedure: CARDIOVERSION;  Surgeon: Thayer Headings, MD;  Location: [facility] ENDOSCOPY;  Service: Cardiovascular;  Laterality: N/A;  . CAROTID ANGIOGRAM N/A 05/27/2012   Procedure: CAROTID Cyril Loosen;  Surgeon: Elam Dutch, MD;  Location: [facility] CATH LAB;  Service: Cardiovascular;  Laterality: N/A;  . CAROTID ENDARTERECTOMY Left 05/30/12  . CATARACT EXTRACTION     right  . ENDARTERECTOMY  05/30/2012   Procedure: ENDARTERECTOMY CAROTID;  Surgeon: Elam Dutch, MD;  Location: [place];  Service: Vascular;  Laterality: Left;  . EYE SURGERY     Jonathan detached retina- 1990's, IOL implants also   . [place]  . JOINT REPLACEMENT     knee- bilateral- 2009 and 2012  . LOBECTOMY  03/15/01   LUL Roxan Hockey)  . RETINAL DETACHMENT SURGERY      right  . SPINE SURGERY  approx 2006   Ruptered Disc  . TONSILLECTOMY     1939  . TOTAL KNEE ARTHROPLASTY     x2 per Dr. Rush Farmer  . WEDGE RESECTION  03/15/01   LUL Roxan Hockey)   Family History  Problem Relation Age of Onset  . Emphysema Father   . Dementia Mother   . Diabetes Mother   . Hypertension Mother   . Hypothyroidism Sister   . Dementia Brother   . Hypertension Brother   . Diabetes Son   . Heart disease Son        Heart Disease before age 31- Open Heart 2010  . Hypertension Son   . Stroke Son        while on coumadin  . Prostate cancer Neg Hx   . Colon cancer Neg Hx    History  Sexual Activity  . Sexual activity: Yes    Outpatient Encounter Prescriptions as of 02/15/2017  Medication Sig  . amLODipine (NORVASC) 5 MG tablet Take 2 tablets (10 mg total) by mouth daily.  Marland Kitchen atorvastatin (LIPITOR) 80 MG tablet TAKE ONE TABLET BY MOUTH AT BEDTIME  . Cinnamon 500 MG capsule Take 500 mg by mouth daily.  Marland Kitchen ELIQUIS 5 MG TABS tablet TAKE 1 TABLET BY MOUTH TWICE DAILY  .  fish oil-omega-3 fatty acids 1000 MG capsule Take 1 g by mouth 3 (three) times daily.    . methocarbamol (ROBAXIN) 500 MG tablet Take 1 tablet (500 mg total) by mouth at bedtime as needed (for restless legs).  . Multiple Vitamins-Minerals (PRESERVISION AREDS 2 PO) Preser Vision Vitamin  Take 2 capsule daily.  Marland Kitchen omeprazole (PRILOSEC) 20 MG capsule Take 1 capsule (20 mg total) by mouth daily.  . psyllium (REGULOID) 0.52 G capsule Take 0.52 g by mouth daily.  . ramipril (ALTACE) 10 MG capsule Take 1 capsule (10 mg total) by mouth daily.  . tamsulosin (FLOMAX) 0.4 MG CAPS capsule Take 1 capsule (0.4 mg total) by mouth daily.   No facility-administered encounter medications on file as of 02/15/2017.     Activities of Daily Living No flowsheet data found.  Patient Care Team: Tonia Ghent, MD as PCP - General (Family Medicine) Martinique, Peter M, MD (Cardiology) Wellington Hampshire, MD as Consulting  Physician (Cardiology)   Assessment:    Physical assessment deferred to PCP.  Exercise Activities and Dietary recommendations    Goals    . Cut out extra servings          Starting 09/10/2015, I will decrease intake of ice cream to one day per week.       Fall Risk Fall Risk  02/14/2016 09/10/2015 02/11/2015 01/25/2014 01/24/2013  Falls in the past year? No No No No No   Depression Screen PHQ 2/9 Scores 02/14/2016 09/10/2015 02/11/2015 01/25/2014  PHQ - 2 Score 0 0 0 0    Cognitive Function PLEASE NOTE: A Mini-Cog screen was completed. Maximum score is 20. A value of 0 denotes this part of [place] MMSE was not completed or the patient failed this part of the Mini-Cog screening.   Mini-Cog Screening Orientation to Time - Max 5 pts Orientation to Place - Max 5 pts Registration - Max 3 pts Recall - Max 3 pts Language Repeat - Max 1 pts Language Follow 3 Step Command - Max 3 pts     MMSE - Mini Mental State Exam 09/10/2015  Orientation to time 5  Orientation to Place 5  Registration 3  Attention/ Calculation 5  Recall 3  Language- repeat 1  Language- follow 3 step command 3  Language- read & follow direction 1        Immunization History  Administered Date(s) Administered  . Influenza Split 05/27/2011, 04/27/2012  . Influenza Whole 05/20/2004, 04/20/2007, 04/09/2008, 04/30/2009, 04/08/2010  . Influenza,inj,Quad PF,36+ Mos 03/29/2013, 04/05/2014, 04/26/2015, 04/24/2016  . Pneumococcal Conjugate-13 02/11/2015  . Pneumococcal Polysaccharide-23 03/09/2002, 03/03/2008  . Td 09/14/2002, 02/14/2016  . Zoster 01/02/2010, 01/04/2010   Screening Tests Health Maintenance  Topic Date Due  . HEMOGLOBIN A1C  01/21/2017  . INFLUENZA VACCINE  02/17/2017  . FOOT EXAM  07/28/2017  . OPHTHALMOLOGY EXAM  10/02/2017  . TETANUS/TDAP  02/13/2026  . PNA vac Low Risk Adult  Completed      Plan:   Follow up with PCP as directed.  I have personally reviewed and noted the following in  the patient's chart:   . Medical and social history . Use of alcohol, tobacco or illicit drugs  . Current medications and supplements . Functional ability and status . Nutritional status . Physical activity . Advanced directives . List of other physicians . Vitals . Screenings to include cognitive, depression, and falls . Referrals and appointments  In addition, I have reviewed and discussed with patient certain preventive protocols,  quality metrics, and best practice recommendations. A written personalized care plan for preventive services as well as general preventive health recommendations were provided to patient.     Ree Edman, RN  02/10/2017

## 2017-02-15 ENCOUNTER — Ambulatory Visit: Payer: Medicare Other

## 2017-02-15 ENCOUNTER — Other Ambulatory Visit: Payer: Medicare Other

## 2017-02-18 ENCOUNTER — Ambulatory Visit (INDEPENDENT_AMBULATORY_CARE_PROVIDER_SITE_OTHER)
Admission: RE | Admit: 2017-02-18 | Discharge: 2017-02-18 | Disposition: A | Discharge status: Home / Self Care | Payer: Medicare Other | Source: Ambulatory Visit | Attending: Family Medicine | Admitting: Family Medicine

## 2017-02-18 ENCOUNTER — Encounter: Payer: Self-pay | Admitting: Family Medicine

## 2017-02-18 ENCOUNTER — Ambulatory Visit (INDEPENDENT_AMBULATORY_CARE_PROVIDER_SITE_OTHER): Payer: Medicare Other | Admitting: Family Medicine

## 2017-02-18 VITALS — BP 140/80 | HR 76 | Temp 97.8°F | Ht 76.0 in | Wt 245.5 lb

## 2017-02-18 DIAGNOSIS — I10 Essential (primary) hypertension: Secondary | ICD-10-CM

## 2017-02-18 DIAGNOSIS — Z85118 Personal history of other malignant neoplasm of bronchus and lung: Secondary | ICD-10-CM

## 2017-02-18 DIAGNOSIS — Z0000 Encounter for general adult medical examination without abnormal findings: Secondary | ICD-10-CM

## 2017-02-18 DIAGNOSIS — C3490 Malignant neoplasm of unspecified part of unspecified bronchus or lung: Secondary | ICD-10-CM

## 2017-02-18 DIAGNOSIS — Z7189 Other specified counseling: Secondary | ICD-10-CM

## 2017-02-18 DIAGNOSIS — K5900 Constipation, unspecified: Secondary | ICD-10-CM | POA: Diagnosis not present

## 2017-02-18 DIAGNOSIS — E119 Type 2 diabetes mellitus without complications: Secondary | ICD-10-CM | POA: Diagnosis not present

## 2017-02-18 DIAGNOSIS — E785 Hyperlipidemia, unspecified: Secondary | ICD-10-CM

## 2017-02-18 DIAGNOSIS — I4891 Unspecified atrial fibrillation: Secondary | ICD-10-CM

## 2017-02-18 LAB — CBC WITH DIFFERENTIAL/PLATELET
BASOS ABS: 0 10*3/uL (ref 0.0–0.1)
BASOS PCT: 0.7 % (ref 0.0–3.0)
Eosinophils Absolute: 0.1 10*3/uL (ref 0.0–0.7)
Eosinophils Relative: 1.2 % (ref 0.0–5.0)
HEMATOCRIT: 46.5 % (ref 39.0–52.0)
HEMOGLOBIN: 15.4 g/dL (ref 13.0–17.0)
LYMPHS PCT: 25.3 % (ref 12.0–46.0)
Lymphs Abs: 1.6 10*3/uL (ref 0.7–4.0)
MCHC: 33.2 g/dL (ref 30.0–36.0)
MCV: 88.9 fl (ref 78.0–100.0)
MONOS PCT: 10.5 % (ref 3.0–12.0)
Monocytes Absolute: 0.7 10*3/uL (ref 0.1–1.0)
NEUTROS ABS: 4 10*3/uL (ref 1.4–7.7)
Neutrophils Relative %: 62.3 % (ref 43.0–77.0)
PLATELETS: 199 10*3/uL (ref 150.0–400.0)
RBC: 5.24 Mil/uL (ref 4.22–5.81)
RDW: 14.1 % (ref 11.5–15.5)
WBC: 6.4 10*3/uL (ref 4.0–10.5)

## 2017-02-18 LAB — COMPREHENSIVE METABOLIC PANEL
ALBUMIN: 4.1 g/dL (ref 3.5–5.2)
ALT: 18 U/L (ref 0–53)
AST: 16 U/L (ref 0–37)
Alkaline Phosphatase: 45 U/L (ref 39–117)
BILIRUBIN TOTAL: 0.8 mg/dL (ref 0.2–1.2)
BUN: 17 mg/dL (ref 6–23)
CALCIUM: 9.2 mg/dL (ref 8.4–10.5)
CHLORIDE: 103 meq/L (ref 96–112)
CO2: 30 meq/L (ref 19–32)
Creatinine, Ser: 0.96 mg/dL (ref 0.40–1.50)
GFR: 79.76 mL/min (ref >60.00)
Glucose, Bld: 138 mg/dL — ABNORMAL HIGH (ref 70–99)
Potassium: 4.6 mEq/L (ref 3.5–5.1)
Sodium: 139 mEq/L (ref 135–145)
TOTAL PROTEIN: 6.7 g/dL (ref 6.0–8.3)

## 2017-02-18 LAB — LIPID PANEL
CHOL/HDL RATIO: 3
Cholesterol: 159 mg/dL (ref 0–200)
HDL: 46.5 mg/dL (ref >39.00)
LDL CALC: 98 mg/dL (ref 0–99)
NONHDL: 112.28
TRIGLYCERIDES: 71 mg/dL (ref 0.0–149.0)
VLDL: 14.2 mg/dL (ref 0.0–40.0)

## 2017-02-18 LAB — HEMOGLOBIN A1C: Hgb A1c MFr Bld: 7.9 % — ABNORMAL HIGH (ref 4.6–6.5)

## 2017-02-18 NOTE — Patient Instructions (Addendum)
Check with your insurance to see if they will cover the shingrix shot. I would get a flu shot each fall.   Try OTC miralax and see if that helps with constipation.   Go to the lab on the way out.  We'll contact you with your lab and xray report. Check to make sure you are taking 10mg  amlodpine a day.  Update me either way.  Take care.  Glad to see you.

## 2017-02-18 NOTE — Assessment & Plan Note (Signed)
Anticoagulated w/o bleeding, continue as is.  He agrees.

## 2017-02-18 NOTE — Assessment & Plan Note (Signed)
No change in meds.  Would continue as is with diet and exercise.  See notes on labs.  Controlled on recheck.

## 2017-02-18 NOTE — Assessment & Plan Note (Signed)
Flu yearly, dw pt Shingles 2011, d/w pt.  See AVS.  PNA UTD Tetanus 2017 Colon not due, d/w pt.  Prostate cancer screening and PSA options (with potential risks and benefits of testing vs not testing) were discussed along with recent recs/guidelines. He declined testing PSA at this point.  Advance directive- wife designated if patient were incapacitated.  Cognitive function addressed- see scanned forms- and if abnormal then additional documentation follows.

## 2017-02-18 NOTE — Assessment & Plan Note (Signed)
Advance directive- wife designated if patient were incapacitated.  

## 2017-02-18 NOTE — Progress Notes (Signed)
I have personally reviewed the Medicare Annual Wellness questionnaire and have noted 1. The patient's medical and social history 2. Their use of alcohol, tobacco or illicit drugs 3. Their current medications and supplements 4. The patient's functional ability including ADL's, fall risks, home safety risks and hearing or visual             impairment. 5. Diet and physical activities 6. Evidence for depression or mood disorders  The patients weight, height, BMI have been recorded in the chart and visual acuity is per eye clinic.  I have made referrals, counseling and provided education to the patient based review of the above and I have provided the pt with a written personalized care plan for preventive services.  Provider list updated- see scanned forms.  Routine anticipatory guidance given to patient.  See health maintenance. The possibility exists that previously documented standard health maintenance information may have been brought forward from a previous encounter into this note.  If needed, that same information has been updated to reflect the current situation based on today's encounter.    Flu yearly, dw pt Shingles 2011, d/w pt.  See AVS.  PNA UTD Tetanus 2017 Colon not due, d/w pt.  Prostate cancer screening and PSA options (with potential risks and benefits of testing vs not testing) were discussed along with recent recs/guidelines. He declined testing PSA at this point.  Advance directive- wife designated if patient were incapacitated.  Cognitive function addressed- see scanned forms- and if abnormal then additional documentation follows.   Constipation.  More straining recently, since he cut out ice cream.  Tired a stool softener w/o much relief.  D/w pt.  See AVS.  No blood in stool.    H/o lung cancer and due for f/u routine CXR.  No cough, no sputum, no hemoptysis.    Diabetes:  No meds.   Hypoglycemic episodes: no sx Hyperglycemic episodes: no sx Feet problems:  no Blood Sugars averaging: not checked eye exam within last year: yes Due for labs.  Pending.     Hypertension:    Using medication without problems or lightheadedness: yes Chest pain with exertion:no Edema:no Short of breath:not unless leaning over and getting compressive sx.  Can still exercise at baseline w/o troubles.   Recheck BP 140/80.    Elevated Cholesterol: Using medications without problems: yes Muscle aches: no Diet compliance: yes Exercise: yes Labs pending.   PMH and SH reviewed.   Vital signs, Meds and allergies reviewed.  ROS: Per HPI unless specifically indicated in ROS section   GEN: nad, alert and oriented HEENT: mucous membranes moist NECK: supple w/o LA CV:  IRR PULM: ctab, no inc wob ABD: soft, +bs EXT: no edema SKIN: no acute rash  Diabetic foot exam: Normal inspection No skin breakdown No calluses  Normal DP pulses Normal sensation to light tough and monofilament Nails normal except for R1st nail chronically thickened.

## 2017-02-18 NOTE — Assessment & Plan Note (Signed)
Okay to try miralax and update me as needed.  He agrees.

## 2017-02-18 NOTE — Assessment & Plan Note (Signed)
Tolerating statin, continue as is with diet and exercise.  See notes on labs.

## 2017-02-18 NOTE — Assessment & Plan Note (Signed)
Recheck labs today, no meds current for sugar, d/w pt.  D/w pt about diet and exercise.

## 2017-02-18 NOTE — Assessment & Plan Note (Signed)
H/o, no sx, recheck cxr today- routine imaging.

## 2017-02-19 ENCOUNTER — Telehealth: Payer: Self-pay | Admitting: *Deleted

## 2017-02-19 MED ORDER — AMLODIPINE BESYLATE 5 MG PO TABS: 5.0000 mg | ORAL_TABLET | Freq: Every day | ORAL | 3 refills | Status: DC

## 2017-02-19 NOTE — Telephone Encounter (Signed)
Med list updated.  Sent.  Thanks.

## 2017-02-19 NOTE — Telephone Encounter (Signed)
Patient checked his Amlodipine when he got home and his bottle says 5 mg once daily and that is how he has been taking it.

## 2017-02-19 NOTE — Addendum Note (Signed)
Addended by: Tonia Ghent on: 02/19/2017 01:58 PM   Modules accepted: Orders

## 2017-03-08 ENCOUNTER — Other Ambulatory Visit: Payer: Self-pay | Admitting: Family Medicine

## 2017-04-21 ENCOUNTER — Other Ambulatory Visit: Payer: Self-pay | Admitting: Cardiovascular Disease

## 2017-05-03 ENCOUNTER — Ambulatory Visit (INDEPENDENT_AMBULATORY_CARE_PROVIDER_SITE_OTHER): Payer: Medicare Other | Admitting: Cardiovascular Disease

## 2017-05-03 ENCOUNTER — Encounter: Payer: Self-pay | Admitting: Cardiovascular Disease

## 2017-05-03 VITALS — BP 180/100 | HR 75 | Ht 75.0 in | Wt 243.0 lb

## 2017-05-03 DIAGNOSIS — I482 Chronic atrial fibrillation, unspecified: Secondary | ICD-10-CM

## 2017-05-03 DIAGNOSIS — E785 Hyperlipidemia, unspecified: Secondary | ICD-10-CM | POA: Diagnosis not present

## 2017-05-03 DIAGNOSIS — I10 Essential (primary) hypertension: Secondary | ICD-10-CM | POA: Diagnosis not present

## 2017-05-03 MED ORDER — RAMIPRIL 10 MG PO CAPS: 10.0000 mg | ORAL_CAPSULE | Freq: Every day | ORAL | 3 refills | Status: DC

## 2017-05-03 NOTE — Progress Notes (Signed)
Cardiology Office Note   Date:  05/03/2017   ID:  Jonathan York, DOB 1933/01/28, MRN [id number]  PCP:  Tonia Ghent, MD  Cardiologist:   Kathlyn Sacramento, MD   Chief Complaint  Patient presents with  . other    6 month follow up. Pt. c/o shortness of breath on exertion.       History of Present Illness: Jonathan York is a 83 y.o. male who presents for chronic atrial fibrillation. He has chronic medical conditions that include hypertension, hyperlipidemia, type 2 diabetes and carotid disease status post left carotid endarterectomy. He has lung cancer status post resection and chemotherapy in 2002. Echocardiogram in January 2017 showed normal LV systolic function, mild aortic regurgitation and mildly to moderately dilated left atrium with mild pulmonary hypertension. He had a syncopal episode in the setting of choking on one of his medications. It was felt to be vasovagal.  He has been doing well and denies any chest pain or palpitations.he has chronic exertional dyspnea with no recent worsening.  Past Medical History:  Diagnosis Date  . Abdominal bruit 05/05/2012  . Arthritis    back pain, past lumbar surgery   . Atrial fibrillation ([place])   . BPH (benign prostatic hypertrophy)   . Cancer ([place])    lung cancer dx'd 2002  . Carotid arterial disease ([place])   . Diabetes mellitus type II    diet controlled   . HLD (hyperlipidemia)   . HTN (hypertension)     Past Surgical History:  Procedure Laterality Date  . ARCH AORTOGRAM  05/27/2012   Procedure: ARCH AORTOGRAM;  Surgeon: Elam Dutch, MD;  Location: [facility] CATH LAB;  Service: Cardiovascular;;  . CARDIOVERSION  06/29/2012   Procedure: CARDIOVERSION;  Surgeon: Thayer Headings, MD;  Location: [facility] ENDOSCOPY;  Service: Cardiovascular;  Laterality: N/A;  . CAROTID ANGIOGRAM N/A 05/27/2012   Procedure: CAROTID Cyril Loosen;  Surgeon: Elam Dutch, MD;  Location: [facility] CATH LAB;  Service: Cardiovascular;  Laterality: N/A;  . CAROTID  ENDARTERECTOMY Left 05/30/12  . CATARACT EXTRACTION     right  . ENDARTERECTOMY  05/30/2012   Procedure: ENDARTERECTOMY CAROTID;  Surgeon: Elam Dutch, MD;  Location: [place];  Service: Vascular;  Laterality: Left;  . EYE SURGERY     Jonathan detached retina- 1990's, IOL implants also   . [place]  . JOINT REPLACEMENT     knee- bilateral- 2009 and 2012  . LOBECTOMY  03/15/01   LUL Roxan Hockey)  . RETINAL DETACHMENT SURGERY     right  . SPINE SURGERY  approx 2006   Ruptered Disc  . TONSILLECTOMY     1939  . TOTAL KNEE ARTHROPLASTY     x2 per Dr. Rush Farmer  . WEDGE RESECTION  03/15/01   LUL Roxan Hockey)     Current Outpatient Prescriptions  Medication Sig Dispense Refill  . amLODipine (NORVASC) 5 MG tablet Take 1 tablet (5 mg total) by mouth daily. 90 tablet 3  . atorvastatin (LIPITOR) 80 MG tablet TAKE ONE TABLET BY MOUTH AT BEDTIME 90 tablet 3  . Cinnamon 500 MG capsule Take 500 mg by mouth daily.    Marland Kitchen ELIQUIS 5 MG TABS tablet TAKE 1 TABLET BY MOUTH TWICE DAILY 180 tablet 3  . fish oil-omega-3 fatty acids 1000 MG capsule Take 1 g by mouth 3 (three) times daily.      . methocarbamol (ROBAXIN) 500 MG tablet Take 1 tablet (500 mg total) by mouth at  bedtime as needed (for restless legs). 50 tablet 2  . Multiple Vitamins-Minerals (PRESERVISION AREDS 2 PO) Preser Vision Vitamin  Take 2 capsule daily.    Marland Kitchen omeprazole (PRILOSEC) 20 MG capsule Take 1 capsule (20 mg total) by mouth daily. 30 capsule 3  . psyllium (REGULOID) 0.52 G capsule Take 0.52 g by mouth daily.    . ramipril (ALTACE) 10 MG capsule Take 1 capsule (10 mg total) by mouth daily. 90 capsule 3  . tamsulosin (FLOMAX) 0.4 MG CAPS capsule Take 1 capsule (0.4 mg total) by mouth daily. 90 capsule 3   No current facility-administered medications for this visit.     Allergies:   Xarelto [rivaroxaban]    Social History:  The patient  reports that he quit smoking about 34 years ago. His smoking use included  Cigarettes. He has a 32.00 pack-year smoking history. He has never used smokeless tobacco. He reports that he does not drink alcohol or use drugs.   Family History:  The patient's family history includes Dementia in his brother and mother; Diabetes in his mother and son; Emphysema in his father; Heart disease in his son; Hypertension in his brother, mother, and son; Hypothyroidism in his sister; Stroke in his son.    ROS:  Please see the history of present illness.   Otherwise, review of systems are positive for none.   All other systems are reviewed and negative.    PHYSICAL EXAM: VS:  BP (!) 180/100 (BP Location: Left Arm, Patient Position: Sitting, Cuff Size: Large)   Pulse 75   Ht 6\' 3"  (1.905 m)   Wt 243 lb (110.2 kg)   BMI 30.37 kg/m  , BMI Body mass index is 30.37 kg/m. GEN: Well nourished, well developed, in no acute distress  HEENT: normal  Neck: no JVD, carotid bruits, or masses Cardiac: Irregularly irregular; no murmurs, rubs, or gallops, trace edema  Respiratory:  clear to auscultation bilaterally, normal work of breathing GI: soft, nontender, nondistended, + BS MS: no deformity or atrophy  Skin: warm and dry, no rash Neuro:  Strength and sensation are intact Psych: euthymic mood, full affect   EKG:  EKG is ordered today. The ekg ordered today demonstrates atrial fibrillation with left anterior fascicular block and possible old inferior infarct   Recent Labs: 02/18/2017: ALT 18; BUN 17; Creatinine, Ser 0.96; Hemoglobin 15.4; Platelets 199.0; Potassium 4.6; Sodium 139    Lipid Panel    Component Value Date/Time   CHOL 159 02/18/2017 1044   TRIG 71.0 02/18/2017 1044   HDL 46.50 02/18/2017 1044   CHOLHDL 3 02/18/2017 1044   VLDL 14.2 02/18/2017 1044   LDLCALC 98 02/18/2017 1044      Wt Readings from Last 3 Encounters:  05/03/17 243 lb (110.2 kg)  02/18/17 245 lb 8 oz (111.4 kg)  01/19/17 242 lb 8 oz (110 kg)         ASSESSMENT AND PLAN:  1.  Chronic  atrial fibrillation: Ventricular rate is controlled without any medications. He is asymptomatic. He is tolerating anticoagulation with Eliquis with no reported side effects.  I reviewed his labs including CBC and basic metabolic profile which were unremarkable.  2. Essential hypertension:  Blood pressure his high today but this is unusual for him. I elected to make no changes for now and monitor blood pressure.  3. Hyperlipidemia: Currently on atorvastatin 80 mg once daily.I reviewed most recent lipid profile in August which showed an LDL of 98.    Disposition:  FU with me in 6 months  Signed,  Kathlyn Sacramento, MD  05/03/2017 2:17 PM    [facility]

## 2017-05-03 NOTE — Patient Instructions (Signed)
Medication Instructions: Continue same medications.   Labwork: None.   Procedures/Testing: None.   Follow-Up: 6 months with Dr. Graceanne Guin.   Any Additional Special Instructions Will Be Listed Below (If Applicable).     If you need a refill on your cardiac medications before your next appointment, please call your pharmacy.   

## 2017-05-04 ENCOUNTER — Ambulatory Visit (INDEPENDENT_AMBULATORY_CARE_PROVIDER_SITE_OTHER): Payer: Medicare Other

## 2017-05-04 DIAGNOSIS — Z23 Encounter for immunization: Secondary | ICD-10-CM

## 2017-05-24 ENCOUNTER — Other Ambulatory Visit (INDEPENDENT_AMBULATORY_CARE_PROVIDER_SITE_OTHER): Payer: Medicare Other

## 2017-05-24 DIAGNOSIS — E119 Type 2 diabetes mellitus without complications: Secondary | ICD-10-CM | POA: Diagnosis not present

## 2017-05-24 LAB — HEMOGLOBIN A1C: HEMOGLOBIN A1C: 7.7 % — AB (ref 4.6–6.5)

## 2017-05-27 ENCOUNTER — Ambulatory Visit (INDEPENDENT_AMBULATORY_CARE_PROVIDER_SITE_OTHER): Payer: Medicare Other | Admitting: Family Medicine

## 2017-05-27 ENCOUNTER — Encounter: Payer: Self-pay | Admitting: Family Medicine

## 2017-05-27 VITALS — BP 132/72 | HR 71 | Temp 97.6°F | Wt 243.2 lb

## 2017-05-27 DIAGNOSIS — I4891 Unspecified atrial fibrillation: Secondary | ICD-10-CM

## 2017-05-27 DIAGNOSIS — E119 Type 2 diabetes mellitus without complications: Secondary | ICD-10-CM | POA: Diagnosis not present

## 2017-05-27 NOTE — Patient Instructions (Signed)
Don't change your meds for now.  I'll await input from cardiology.  If the swelling gets worse, then let me know.  Recheck labs in about 4 months prior to a visit.  Take care.  Glad to see you.

## 2017-05-27 NOTE — Assessment & Plan Note (Signed)
A1c d/w pt.  Goal A1c <8 if off meds.  D/w pt, he agrees.   Recheck in about 4 months.    D/w pt about BLE edema, trace, he'll observe.  Could be from stopping eliquis but more likely from amlodipine.  No CP, not SOB, CTAB and okay for observation in the meatnime.  He agrees.  He'll update me as needed, if worsening.  No salt loading per patient report.

## 2017-05-27 NOTE — Progress Notes (Signed)
Diabetes:  No meds.  Hypoglycemic episodes: no sx Hyperglycemic episodes: no sx Feet problems: no tingling.   Blood Sugars averaging: not checked often eye exam within last year: yes A1c d/w pt.  Goal A1c <8 if off meds.  D/w pt.    Foot swelling recently noted, unclear duration.  No change in the AM compared to the last night.  No CP, SOB.  B and equal LE edema.  Is on amlodipine.   He is having trouble paying for his eliquis.  D/w pt.  Still on med until the last few days, changed to 325mg  aspirin.  I contacted cards about options.    PMH and SH reviewed  Meds, vitals, and allergies reviewed.   ROS: Per HPI unless specifically indicated in ROS section   GEN: nad, alert and oriented HEENT: mucous membranes moist NECK: supple w/o LA CV: IRR, not tachy.  PULM: ctab, no inc wob ABD: soft, +bs EXT: trace BLE edema SKIN: no acute rash  Diabetic foot exam: Normal inspection No skin breakdown No calluses  Normal DP pulses Normal sensation to light touch and monofilament Nails thickened B

## 2017-05-27 NOTE — Assessment & Plan Note (Signed)
Will ask for cards input on eliquis help.  D/w pt.  Would continue asa 325 for now but d/w pt about this not being optimal.  He agrees.

## 2017-05-31 ENCOUNTER — Telehealth: Payer: Self-pay | Admitting: Cardiovascular Disease

## 2017-05-31 NOTE — Telephone Encounter (Signed)
Per PCP notes, pt stopped taking Eliquis due to cost; taking 325mg  ASA. S/w pt who states he has never used a 30-day free coupon card.  Provided card and samples to patient. At front desk awaiting pick up. Pt appreciative.   Samples of Eliquis were given to the patient, quantity 2 boxes, Lot Number [id number] Exp: 05/2019

## 2017-07-19 ENCOUNTER — Other Ambulatory Visit: Payer: Self-pay

## 2017-07-19 ENCOUNTER — Ambulatory Visit: Payer: Medicare Other | Admitting: Family Medicine

## 2017-07-19 ENCOUNTER — Encounter: Payer: Self-pay | Admitting: Family Medicine

## 2017-07-19 VITALS — BP 124/82 | HR 70 | Temp 97.3°F | Ht 76.0 in | Wt 242.8 lb

## 2017-07-19 DIAGNOSIS — H9201 Otalgia, right ear: Secondary | ICD-10-CM

## 2017-07-19 DIAGNOSIS — H9191 Unspecified hearing loss, right ear: Secondary | ICD-10-CM | POA: Diagnosis not present

## 2017-07-19 NOTE — Progress Notes (Signed)
Dr. Frederico Hamman T. Nichelle Renwick, MD, [facility] [place], [address] Phone: [phone] Fax: [phone]  07/19/2017  Patient: Jonathan York, MRN: [id number], DOB: [birth year], 83 y.o.  Primary Physician:  Tonia Ghent, MD   Chief Complaint  Patient presents with  . Otalgia    Right   Subjective:   R Kurk Corniel is a 83 y.o. very pleasant male patient who presents with the following:  R earache. Has a cough some, too. Eating. Used syringe on Friday.   Very pleasant gentleman, 83 years old, who presents with right-sided ear pain.  He was having some difficulty with his right ear and then used a syringe to flush his right ear on Friday, and he is been having a lot of difficulty and decreased hearing as well as pain since then.  Past Medical History, Surgical History, Social History, Family History, Problem List, Medications, and Allergies have been reviewed and updated if relevant.  Patient Active Problem List   Diagnosis Date Noted  . Constipation 02/18/2017  . GERD (gastroesophageal reflux disease) 01/21/2017  . Lower urinary tract symptoms (LUTS) 01/02/2015  . Seborrheic keratoses 01/25/2014  . Advance care planning 01/25/2014  . Back pain 01/25/2013  . Second degree AV block, Mobitz type I 09/18/2012  . Occlusion and stenosis of carotid artery without mention of cerebral infarction 05/12/2012  . A-fib ([place]) 05/05/2012  . Left carotid bruit 05/05/2012  . Abdominal bruit 05/05/2012  . Skin lesion 01/26/2012  . Cough 12/18/2011  . Medicare annual wellness visit, subsequent 11/03/2011  . Vertigo 11/02/2011  . Hyperlipidemia with target LDL less than 100 11/19/2010  . CHRONIC OBSTRUCTIVE PULMONARY DISEASE 01/01/2010  . 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 RESTLESS LEG SYNDROME 07/01/2009  . NEOPLASM OF UNCERTAIN BEHAVIOR OF SKIN 10/18/2008  . Diabetes mellitus without complication ([place]) 09/73/5329  . Essential  hypertension 03/17/2007  . HEMORRHOIDS 03/17/2007  . BENIGN PROSTATIC HYPERTROPHY 03/17/2007  . DEGENERATIVE JOINT DISEASE, KNEES, BILATERAL 03/17/2007  . 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 Abdominal bruit 05/05/2012  . Arthritis    back pain, past lumbar surgery   . Atrial fibrillation ([place])   . BPH (benign prostatic hypertrophy)   . Cancer ([place])    lung cancer dx'd 2002  . Carotid arterial disease ([place])   . Diabetes mellitus type II    diet controlled   . HLD (hyperlipidemia)   . HTN (hypertension)     Past Surgical History:  Procedure Laterality Date  . ARCH AORTOGRAM  05/27/2012   Procedure: ARCH AORTOGRAM;  Surgeon: Elam Dutch, MD;  Location: [facility] CATH LAB;  Service: Cardiovascular;;  . CARDIOVERSION  06/29/2012   Procedure: CARDIOVERSION;  Surgeon: Thayer Headings, MD;  Location: [facility] ENDOSCOPY;  Service: Cardiovascular;  Laterality: N/A;  . CAROTID ANGIOGRAM N/A 05/27/2012   Procedure: CAROTID Cyril Loosen;  Surgeon: Elam Dutch, MD;  Location: [facility] CATH LAB;  Service: Cardiovascular;  Laterality: N/A;  . CAROTID ENDARTERECTOMY Left 05/30/12  . CATARACT EXTRACTION     right  . ENDARTERECTOMY  05/30/2012   Procedure: ENDARTERECTOMY CAROTID;  Surgeon: Elam Dutch, MD;  Location: [facility];  Service: Vascular;  Laterality: Left;  . EYE SURGERY     R detached retina- 1990's, IOL implants also   . [place]  . JOINT REPLACEMENT     knee- bilateral- 2009 and 2012  . LOBECTOMY  03/15/01   LUL Roxan Hockey)  . RETINAL DETACHMENT SURGERY     right  . SPINE SURGERY  approx 2006   Ruptered Disc  . TONSILLECTOMY     1939  . TOTAL KNEE ARTHROPLASTY     x2 per Dr. Rush Farmer  . WEDGE RESECTION  03/15/01   [facility] Hockey)    Social History   Socioeconomic History  . Marital status: Married    Spouse name: Not on file  . Number of children: 3  . Years of education: Not on file  . Highest education  level: Not on file  Social Needs  . Financial resource strain: Not on file  . Food insecurity - worry: Not on file  . Food insecurity - inability: Not on file  . Transportation needs - medical: Not on file  . Transportation needs - non-medical: Not on file  Occupational History  . Occupation: Fired then [employer]  Tobacco Use  . Smoking status: Former Smoker    Packs/day: 1.00    Years: 32.00    Pack years: 32.00    Types: Cigarettes    Last attempt to quit: 09/03/1982    Years since quitting: 34.8  . Smokeless tobacco: Never Used  Substance and Sexual Activity  . Alcohol use: No    Alcohol/week: 0.0 oz    Comment:    . Drug use: No  . Sexual activity: Yes  Other Topics Concern  . Not on file  Social History Narrative   Retired Personal assistant   From [employer]   Married happily since 1964    Family History  Problem Relation Age of Onset  . Emphysema Father   . Dementia Mother   . Diabetes Mother   . Hypertension Mother   . Hypothyroidism Sister   . Dementia Brother   . Hypertension Brother   . Diabetes Son   . Heart disease Son        Heart Disease before age 40- Open Heart 2010  . Hypertension Son   . Stroke Son        while on coumadin  . Prostate cancer Neg Hx   . Colon cancer Neg Hx     Allergies  Allergen Reactions  . Xarelto [Rivaroxaban] Other (See Comments)    vertigo    Medication list reviewed and updated in full in [place].  ROS: GEN: Acute illness details above GI: Tolerating PO intake GU: maintaining adequate hydration and urination Pulm: No SOB Interactive and getting along well at home.  Otherwise, ROS is as per the HPI.  Objective:   BP 124/82   Pulse 70   Temp (!) 97.3 F (36.3 C) (Oral)   Ht 6\' 4"  (1.93 m)   Wt 242 lb 12 oz (110.1 kg)   BMI 29.55 kg/m    GEN: WDWN, NAD, Non-toxic, A & O x 3 HEENT: Atraumatic, Normocephalic. Neck supple. No masses, No LAD. B cerumen impaction. NT pinna and tragus.  Clear TM after irrigation. Ears and Nose: No external deformity. CV: RRR, No M/G/R. No JVD. No thrill. No extra heart sounds. PULM: CTA B, no wheezes, crackles, rhonchi. No retractions. No resp. distress. No accessory muscle use. EXTR: No c/c/e NEURO Normal gait.  PSYCH: Normally interactive. Conversant. Not depressed or anxious appearing.  Calm demeanor.     Laboratory and Imaging Data:  Assessment and Plan:   Acute ear pain, right  Decreased hearing of right ear  Resolved.  Ceruminosis is noted.  Wax is  removed by syringing and manual debridement. Instructions for home care to prevent wax buildup are given.   Follow-up: No Follow-up on file.  Signed,  Maud Deed. Cedar Ditullio, MD   Allergies as of 07/19/2017      Reactions   Xarelto [rivaroxaban] Other (See Comments)   vertigo      Medication List        Accurate as of 07/19/17 10:11 AM. Always use your most recent med list.          amLODipine 5 MG tablet Commonly known as:  NORVASC Take 1 tablet (5 mg total) by mouth daily.   atorvastatin 80 MG tablet Commonly known as:  LIPITOR TAKE ONE TABLET BY MOUTH AT BEDTIME   Cinnamon 500 MG capsule Take 500 mg by mouth daily.   ELIQUIS 5 MG Tabs tablet Generic drug:  apixaban TAKE 1 TABLET BY MOUTH TWICE DAILY   fish oil-omega-3 fatty acids 1000 MG capsule Take 1 g by mouth 3 (three) times daily.   methocarbamol 500 MG tablet Commonly known as:  ROBAXIN Take 1 tablet (500 mg total) by mouth at bedtime as needed (for restless legs).   omeprazole 20 MG capsule Commonly known as:  PRILOSEC Take 1 capsule (20 mg total) by mouth daily.   PRESERVISION AREDS 2 PO Preser Vision Vitamin  Take 2 capsule daily.   psyllium 0.52 g capsule Commonly known as:  REGULOID Take 0.52 g by mouth daily.   ramipril 10 MG capsule Commonly known as:  ALTACE Take 1 capsule (10 mg total) by mouth daily.   tamsulosin 0.4 MG Caps capsule Commonly known as:  FLOMAX Take 1  capsule (0.4 mg total) by mouth daily.

## 2017-07-20 DIAGNOSIS — G459 Transient cerebral ischemic attack, unspecified: Secondary | ICD-10-CM

## 2017-07-20 HISTORY — DX: Transient cerebral ischemic attack, unspecified: G45.9

## 2017-08-22 ENCOUNTER — Other Ambulatory Visit: Payer: Self-pay | Admitting: Family Medicine

## 2017-08-22 DIAGNOSIS — E119 Type 2 diabetes mellitus without complications: Secondary | ICD-10-CM

## 2017-09-21 ENCOUNTER — Other Ambulatory Visit (INDEPENDENT_AMBULATORY_CARE_PROVIDER_SITE_OTHER): Payer: Medicare Other

## 2017-09-21 DIAGNOSIS — E119 Type 2 diabetes mellitus without complications: Secondary | ICD-10-CM | POA: Diagnosis not present

## 2017-09-21 LAB — HEMOGLOBIN A1C: HEMOGLOBIN A1C: 7.7 % — AB (ref 4.6–6.5)

## 2017-09-24 ENCOUNTER — Encounter: Payer: Self-pay | Admitting: Family Medicine

## 2017-09-24 ENCOUNTER — Ambulatory Visit (INDEPENDENT_AMBULATORY_CARE_PROVIDER_SITE_OTHER): Payer: Medicare Other | Admitting: Family Medicine

## 2017-09-24 DIAGNOSIS — E119 Type 2 diabetes mellitus without complications: Secondary | ICD-10-CM | POA: Diagnosis not present

## 2017-09-24 NOTE — Patient Instructions (Addendum)
Don't change your meds for now.  Recheck later in 2019.   Ask the pharmacy about early refills since your are travelling.   Take care.  Glad to see you.

## 2017-09-24 NOTE — Progress Notes (Signed)
Diabetes:  No meds Hypoglycemic episodes: no sx Hyperglycemic episodes: no sx Feet problems: no Blood Sugars averaging: not checked.   eye exam within last year: yes A1c stable.  Diet d/w pt. He is on low carb diet and he is working out at [employer].    Meds, vitals, and allergies reviewed.   ROS: Per HPI unless specifically indicated in ROS section   GEN: nad, alert and oriented HEENT: mucous membranes moist NECK: supple w/o LA CV: IRR.  Not tachy.  PULM: ctab, no inc wob ABD: soft, +bs EXT: no edema SKIN: no acute rash

## 2017-09-26 NOTE — Assessment & Plan Note (Addendum)
A1c stable.  Diet d/w pt. He is on low carb diet and he is working out at [employer].   No change in meds for now.  Recheck later in 2019.   He agrees.

## 2017-10-05 ENCOUNTER — Ambulatory Visit (INDEPENDENT_AMBULATORY_CARE_PROVIDER_SITE_OTHER): Payer: Medicare Other | Admitting: Ophthalmology

## 2017-10-05 DIAGNOSIS — E11319 Type 2 diabetes mellitus with unspecified diabetic retinopathy without macular edema: Secondary | ICD-10-CM | POA: Diagnosis not present

## 2017-10-05 DIAGNOSIS — H43813 Vitreous degeneration, bilateral: Secondary | ICD-10-CM

## 2017-10-05 DIAGNOSIS — I10 Essential (primary) hypertension: Secondary | ICD-10-CM | POA: Diagnosis not present

## 2017-10-05 DIAGNOSIS — E113293 Type 2 diabetes mellitus with mild nonproliferative diabetic retinopathy without macular edema, bilateral: Secondary | ICD-10-CM

## 2017-10-05 DIAGNOSIS — H35033 Hypertensive retinopathy, bilateral: Secondary | ICD-10-CM | POA: Diagnosis not present

## 2017-10-05 DIAGNOSIS — H353132 Nonexudative age-related macular degeneration, bilateral, intermediate dry stage: Secondary | ICD-10-CM

## 2017-10-05 DIAGNOSIS — H338 Other retinal detachments: Secondary | ICD-10-CM | POA: Diagnosis not present

## 2017-10-05 LAB — HM DIABETES EYE EXAM

## 2017-10-11 ENCOUNTER — Encounter: Payer: Self-pay | Admitting: Family Medicine

## 2017-10-11 DIAGNOSIS — E11319 Type 2 diabetes mellitus with unspecified diabetic retinopathy without macular edema: Secondary | ICD-10-CM | POA: Insufficient documentation

## 2017-10-12 ENCOUNTER — Other Ambulatory Visit: Payer: Self-pay | Admitting: Family Medicine

## 2017-10-12 ENCOUNTER — Encounter: Payer: Self-pay | Admitting: Family Medicine

## 2017-11-12 ENCOUNTER — Encounter: Payer: Self-pay | Admitting: Physician Assistant

## 2017-11-12 NOTE — Progress Notes (Signed)
Cardiology Office Note Date:  11/18/2017  Patient ID:  Jonathan York, DOB 03-28-1933, MRN [id number] PCP:  Tonia Ghent, MD  Cardiologist:  Dr. Fletcher Anon, MD    Chief Complaint: Follow up Afib  History of Present Illness: Jonathan York is a 84 y.o. male with history of chronic Afib on Eliquis, lung cancer s/p resection and chemotherapy in 2002, carotid artery disease s/p left-sided CEA in 05/2012 with carotid ultrasound from 2016 showing 40% RICA stenosis and LICA patent, prior syncopal episode felt to be vasovagal in the setting of choking on a medication, DM2, HTN, and HLD who presents for follow up of Afib.   Echo in 07/2015 showed normal LVSF with an EF of 55-60%, mild concentric LVH, no RWMA, mild AI, mildly dilated aortic root measuring 3.7 cm with an ascending aorta measuring 3.4 cm, mildly to moderately dilated LA, RVSF normal, PASP 36 mmHg. He was most recently see, by Dr. Fletcher Anon in 04/2017 for follow up and was doing well at that time. Blood pressure was noted to be high at 180/100, which was unusual for hime, observation was advised. He was seen by his PCP in 05/2017 and it was noted the patient had self-discontinued Eliquis secondary to cost and had self started on ASA 325 mg daily. We were notified and provided the patient with a coupon card as well as samples.   He comes in doing well today.  He denies any chest pain or palpitations.  He reports chronic dyspnea that is only associated when bending over and picking up limbs.  He is able to mow his entire 2.5 acre yard on a riding mower and do the trim work with a Scientist, research (life sciences) without any symptoms.  No orthopnea, lower extremity swelling, abdominal distention, cough, or early satiety.  Now back on Eliquis 5 mg twice daily and has not missed any doses.  No recent falls.  No BRBPR or melena.  He does note mild ankle swelling that improves when laying supine overnight.  Labs 09/2016: A1c 7.7 02/2017: LDL 98, CBC unremarkable, SCr 0.96, K+ 4.6, LFT  normal   Past Medical History:  Diagnosis Date  . Abdominal bruit 05/05/2012  . Arthritis    back pain, past lumbar surgery   . BPH (benign prostatic hypertrophy)   . Carotid arterial disease ([facility])    a. s/p left-sided CEA  . Chronic atrial fibrillation ([place])    a. CHADS2VASc => 5 (HTN, age x 2, DM, vascular disease); Eliquis  . Diabetes mellitus type II    diet controlled   . HLD (hyperlipidemia)   . HTN (hypertension)   . Lung cancer [facility])    a. s/p resection and chemo in 2002    Past Surgical History:  Procedure Laterality Date  . ARCH AORTOGRAM  05/27/2012   Procedure: ARCH AORTOGRAM;  Surgeon: Elam Dutch, MD;  Location: [facility] CATH LAB;  Service: Cardiovascular;;  . CARDIOVERSION  06/29/2012   Procedure: CARDIOVERSION;  Surgeon: Thayer Headings, MD;  Location: [facility] ENDOSCOPY;  Service: Cardiovascular;  Laterality: N/A;  . CAROTID ANGIOGRAM N/A 05/27/2012   Procedure: CAROTID Cyril Loosen;  Surgeon: Elam Dutch, MD;  Location: [facility] CATH LAB;  Service: Cardiovascular;  Laterality: N/A;  . CAROTID ENDARTERECTOMY Left 05/30/12  . CATARACT EXTRACTION     right  . ENDARTERECTOMY  05/30/2012   Procedure: ENDARTERECTOMY CAROTID;  Surgeon: Elam Dutch, MD;  Location: [place];  Service: Vascular;  Laterality: Left;  . EYE SURGERY  Jonathan detached retina- 1990's, IOL implants also   . [place]  . JOINT REPLACEMENT     knee- bilateral- 2009 and 2012  . LOBECTOMY  03/15/01   L[facility] R[facility] Hockey)  . RETINAL DETACHMENT SURGERY     right  . SPINE SURGERY  approx 2006   Ruptered Disc  . TONSILLECTOMY     1939  . TOTAL KNEE ARTHROPLASTY     x2 per Dr. Rush Farmer  . WEDGE RESECTION  03/15/01   LUL R[facility] Hockey)    Current Meds  Medication Sig  . amLODipine (NORVASC) 5 MG tablet Take 1 tablet (5 mg total) by mouth daily.  Marland Kitchen atorvastatin (LIPITOR) 80 MG tablet TAKE ONE TABLET BY MOUTH AT BEDTIME  . Cinnamon 500 MG capsule Take 500 mg by mouth daily.  Marland Kitchen ELIQUIS 5 MG  TABS tablet TAKE 1 TABLET BY MOUTH TWICE DAILY  . fish oil-omega-3 fatty acids 1000 MG capsule Take 1 g by mouth 3 (three) times daily.    . methocarbamol (ROBAXIN) 500 MG tablet Take 1 tablet (500 mg total) by mouth at bedtime as needed (for restless legs).  . Multiple Vitamins-Minerals (PRESERVISION AREDS 2 PO) Preser Vision Vitamin  Take 2 capsule daily.  Marland Kitchen omeprazole (PRILOSEC) 20 MG capsule Take 1 capsule (20 mg total) by mouth daily.  . psyllium (REGULOID) 0.52 G capsule Take 0.52 g by mouth daily.  . ramipril (ALTACE) 10 MG capsule Take 1 capsule (10 mg total) by mouth daily.  . tamsulosin (FLOMAX) 0.4 MG CAPS capsule TAKE ONE CAPSULE BY MOUTH ONCE DAILY    Allergies:   Xarelto [rivaroxaban]   Social History:  The patient  reports that he quit smoking about 35 years ago. His smoking use included cigarettes. He has a 32.00 pack-year smoking history. He has never used smokeless tobacco. He reports that he does not drink alcohol or use drugs.   Family History:  The patient's family history includes Dementia in his brother and mother; Diabetes in his mother and son; Emphysema in his father; Heart disease in his son; Hypertension in his brother, mother, and son; Hypothyroidism in his sister; Stroke in his son.  ROS:   Review of Systems  Constitutional: Negative for chills, diaphoresis, fever, malaise/fatigue and weight loss.  HENT: Negative for congestion.   Eyes: Negative for discharge and redness.  Respiratory: Positive for shortness of breath. Negative for cough, hemoptysis, sputum production and wheezing.        Shortness of breath noted only when bending over and picking up limbs  Cardiovascular: Positive for leg swelling. Negative for chest pain, palpitations, orthopnea, claudication and PND.  Gastrointestinal: Negative for abdominal pain, blood in stool, heartburn, melena, nausea and vomiting.  Genitourinary: Negative for hematuria.  Musculoskeletal: Negative for falls and  myalgias.  Skin: Negative for rash.  Neurological: Negative for dizziness, tingling, tremors, sensory change, speech change, focal weakness, loss of consciousness and weakness.  Endo/Heme/Allergies: Does not bruise/bleed easily.  Psychiatric/Behavioral: Negative for substance abuse. The patient is not nervous/anxious.   All other systems reviewed and are negative.    PHYSICAL EXAM:  VS:  BP 130/84 (BP Location: Left Arm, Patient Position: Sitting, Cuff Size: Normal)   Pulse 60   Ht 6\' 4"  (1.93 m)   Wt 239 lb 8 oz (108.6 kg)   BMI 29.15 kg/m  BMI: Body mass index is 29.15 kg/m.  Physical Exam  Constitutional: He is oriented to person, place, and time. He appears well-developed and well-nourished.  HENT:  Head: Normocephalic and atraumatic.  Eyes: Right eye exhibits no discharge. Left eye exhibits no discharge.  Neck: Normal range of motion. No JVD present.  Cardiovascular: Normal rate, S1 normal and S2 normal. An irregularly irregular rhythm present. Exam reveals no distant heart sounds, no friction rub, no midsystolic click and no opening snap.  Murmur heard. High-pitched blowing decrescendo early diastolic murmur is present with a grade of 1/6 at the upper right sternal border radiating to the apex. Pulses:      Posterior tibial pulses are 2+ on the right side, and 2+ on the left side.  Pulmonary/Chest: Effort normal and breath sounds normal. No respiratory distress. He has no decreased breath sounds. He has no wheezes. He has no rales. He exhibits no tenderness.  Abdominal: Soft. He exhibits no distension. There is no tenderness.  Musculoskeletal: He exhibits no edema.  Neurological: He is alert and oriented to person, place, and time.  Skin: Skin is warm and dry. No cyanosis. Nails show no clubbing.  Psychiatric: He has a normal mood and affect. His speech is normal and behavior is normal. Judgment and thought content normal.    EKG:  Was ordered and interpreted by me today.  Shows Afib, 60 bpm, left axis deviation, left anterior fascicular block, possible prior inferior infarct (unchanged from prior)  Recent Labs: 02/18/2017: ALT 18; BUN 17; Creatinine, Ser 0.96; Hemoglobin 15.4; Platelets 199.0; Potassium 4.6; Sodium 139  02/18/2017: Cholesterol 159; HDL 46.50; LDL Cholesterol 98; Total CHOL/HDL Ratio 3; Triglycerides 71.0; VLDL 14.2   CrCl cannot be calculated (Patient's most recent lab result is older than the maximum 21 days allowed.).   Wt Readings from Last 3 Encounters:  11/18/17 239 lb 8 oz (108.6 kg)  09/24/17 241 lb 4 oz (109.4 kg)  07/19/17 242 lb 12 oz (110.1 kg)     Other studies reviewed: Additional studies/records reviewed today include: summarized above  ASSESSMENT AND PLAN:  1. Chronic Afib: Ventricular rates well controlled not on any medications. CHADS2VASc at least 5 as above. Continue Eliquis 5 mg twice daily.  2. Cardiac murmur: Prior echo from 2017 demonstrated mild aortic insufficiency.  Check echocardiogram.  3. SOB: No exertional symptoms.  Check echo as above.  4. Trace lower extremity swelling: Check echocardiogram as above.  Likely secondary to venous insufficiency given history.  Elevate legs.  No indication for diuretic therapy at this time.  He prefers to avoid compression stockings.  5. HTN: Blood pressure well controlled today.  Continue amlodipine 5 mg daily.  6. HLD: LDL from 02/2017 of 98. On Lipitor 80 mg. Followed by PCP.   7. Carotid artery disease: Status post left-sided CEA. LDL as above.   Disposition: F/u with Dr. Fletcher Anon in 6 months.  Current medicines are reviewed at length with the patient today.  The patient did not have any concerns regarding medicines.  Signed, Christell Faith, PA-C 11/18/2017 9:41 AM     [facility] [address] [place] [address] [place], [place] [address] [phone]

## 2017-11-18 ENCOUNTER — Encounter: Payer: Self-pay | Admitting: Physician Assistant

## 2017-11-18 ENCOUNTER — Ambulatory Visit: Payer: Medicare Other | Admitting: Physician Assistant

## 2017-11-18 ENCOUNTER — Other Ambulatory Visit: Payer: Self-pay | Admitting: Physician Assistant

## 2017-11-18 VITALS — BP 130/84 | HR 60 | Ht 76.0 in | Wt 239.5 lb

## 2017-11-18 DIAGNOSIS — E785 Hyperlipidemia, unspecified: Secondary | ICD-10-CM | POA: Diagnosis not present

## 2017-11-18 DIAGNOSIS — R0602 Shortness of breath: Secondary | ICD-10-CM | POA: Diagnosis not present

## 2017-11-18 DIAGNOSIS — M7989 Other specified soft tissue disorders: Secondary | ICD-10-CM

## 2017-11-18 DIAGNOSIS — I10 Essential (primary) hypertension: Secondary | ICD-10-CM | POA: Diagnosis not present

## 2017-11-18 DIAGNOSIS — I482 Chronic atrial fibrillation, unspecified: Secondary | ICD-10-CM

## 2017-11-18 DIAGNOSIS — I351 Nonrheumatic aortic (valve) insufficiency: Secondary | ICD-10-CM | POA: Diagnosis not present

## 2017-11-18 MED ORDER — APIXABAN 5 MG PO TABS: 5.0000 mg | ORAL_TABLET | Freq: Two times a day (BID) | ORAL | 3 refills | Status: DC

## 2017-11-18 NOTE — Patient Instructions (Signed)
Medication Instructions:  Your physician recommends that you continue on your current medications as directed. Please refer to the Current Medication list given to you today.   Labwork: none  Testing/Procedures: Your physician has requested that you have an echocardiogram. Echocardiography is a painless test that uses sound waves to create images of your heart. It provides your doctor with information about the size and shape of your heart and how well your heart's chambers and valves are working. This procedure takes approximately one hour. There are no restrictions for this procedure. You may receive an IV if needed to better visual your heart. They would use an ultrasound-enhancing agent in your IV if needed.     Follow-Up: Your physician wants you to follow-up in: [place].  You will receive a reminder letter in the mail two months in advance. If you don't receive a letter, please call our office to schedule the follow-up appointment.  If you need a refill on your cardiac medications before your next appointment, please call your pharmacy.   Echocardiogram An echocardiogram, or echocardiography, uses sound waves (ultrasound) to produce an image of your heart. The echocardiogram is simple, painless, obtained within a short period of time, and offers valuable information to your health care provider. The images from an echocardiogram can provide information such as:  Evidence of coronary artery disease (CAD).  Heart size.  Heart muscle function.  Heart valve function.  Aneurysm detection.  Evidence of a past heart attack.  Fluid buildup around the heart.  Heart muscle thickening.  Assess heart valve function.  Tell a health care provider about:  Any allergies you have.  All medicines you are taking, including vitamins, herbs, eye drops, creams, and over-the-counter medicines.  Any problems you or family members have had with anesthetic medicines.  Any  blood disorders you have.  Any surgeries you have had.  Any medical conditions you have.  Whether you are pregnant or may be pregnant. What happens before the procedure? No special preparation is needed. Eat and drink normally. What happens during the procedure?  In order to produce an image of your heart, gel will be applied to your chest and a wand-like tool (transducer) will be moved over your chest. The gel will help transmit the sound waves from the transducer. The sound waves will harmlessly bounce off your heart to allow the heart images to be captured in real-time motion. These images will then be recorded.  You may need an IV to receive a medicine that improves the quality of the pictures. What happens after the procedure? You may return to your normal schedule including diet, activities, and medicines, unless your health care provider tells you otherwise. This information is not intended to replace advice given to you by your health care provider. Make sure you discuss any questions you have with your health care provider. Document Released: 07/03/2000 Document Revised: 02/22/2016 Document Reviewed: 03/13/2013 Elsevier Interactive Patient Education  2017 [employer].

## 2017-11-25 ENCOUNTER — Ambulatory Visit (INDEPENDENT_AMBULATORY_CARE_PROVIDER_SITE_OTHER): Payer: Medicare Other

## 2017-11-25 ENCOUNTER — Other Ambulatory Visit: Payer: Self-pay

## 2017-11-25 DIAGNOSIS — R0602 Shortness of breath: Secondary | ICD-10-CM

## 2017-11-25 MED ORDER — PERFLUTREN LIPID MICROSPHERE
1.0000 mL | INTRAVENOUS | Status: AC | PRN
  Administered 2017-11-25: 2 mL via INTRAVENOUS

## 2017-12-06 ENCOUNTER — Ambulatory Visit (INDEPENDENT_AMBULATORY_CARE_PROVIDER_SITE_OTHER): Payer: Medicare Other | Admitting: Internal Medicine

## 2017-12-06 VITALS — BP 148/84 | HR 65 | Temp 98.5°F | Wt 242.0 lb

## 2017-12-06 DIAGNOSIS — J069 Acute upper respiratory infection, unspecified: Secondary | ICD-10-CM | POA: Diagnosis not present

## 2017-12-06 MED ORDER — PREDNISONE 10 MG PO TABS: ORAL_TABLET | ORAL | 0 refills | Status: DC

## 2017-12-06 MED ORDER — AMOXICILLIN 500 MG PO CAPS: 500.0000 mg | ORAL_CAPSULE | Freq: Three times a day (TID) | ORAL | 0 refills | Status: DC

## 2017-12-06 NOTE — Progress Notes (Signed)
HPI  Pt presents to the clinic today with c/o nasal congestion and cough. This started 1 week ago. He started noticing symptoms after he was outside cutting grass. He is blowing yellow/green mucous out of his nose. The cough is productive of yellow/green mucous. He denies fever, chills or body aches. He has tried Tussin with minimal relief. He has a history of DM 2 and lung cancer. He reports no history of allergies. He has not had sick contacts.   Review of Systems     Past Medical History:  Diagnosis Date  . Abdominal bruit 05/05/2012  . Arthritis    back pain, past lumbar surgery   . BPH (benign prostatic hypertrophy)   . Carotid arterial disease (HCC)    a. s/p left-sided CEA  . Chronic atrial fibrillation ([place])    a. CHADS2VASc => 5 (HTN, age x 2, DM, vascular disease); Eliquis  . Diabetes mellitus type II    diet controlled   . HLD (hyperlipidemia)   . HTN (hypertension)   . Lung cancer [facility])    a. s/p resection and chemo in 2002    Family History  Problem Relation Age of Onset  . Emphysema Father   . Dementia Mother   . Diabetes Mother   . Hypertension Mother   . Hypothyroidism Sister   . Dementia Brother   . Hypertension Brother   . Diabetes Son   . Heart disease Son        Heart Disease before age 70- Open Heart 2010  . Hypertension Son   . Stroke Son        while on coumadin  . Prostate cancer Neg Hx   . Colon cancer Neg Hx     Social History   Socioeconomic History  . Marital status: Married    Spouse name: Not on file  . Number of children: 3  . Years of education: Not on file  . Highest education level: Not on file  Occupational History  . Occupation: Fired then [employer]  Social Needs  . Financial resource strain: Not on file  . Food insecurity:    Worry: Not on file    Inability: Not on file  . Transportation needs:    Medical: Not on file    Non-medical: Not on file  Tobacco Use  . Smoking status: Former Smoker   Packs/day: 1.00    Years: 32.00    Pack years: 32.00    Types: Cigarettes    Last attempt to quit: 09/03/1982    Years since quitting: 35.2  . Smokeless tobacco: Never Used  Substance and Sexual Activity  . Alcohol use: No    Alcohol/week: 0.0 oz    Comment:    . Drug use: No  . Sexual activity: Yes  Lifestyle  . Physical activity:    Days per week: Not on file    Minutes per session: Not on file  . Stress: Not on file  Relationships  . Social connections:    Talks on phone: Not on file    Gets together: Not on file    Attends religious service: Not on file    Active member of club or organization: Not on file    Attends meetings of clubs or organizations: Not on file    Relationship status: Not on file  . Intimate partner violence:    Fear of current or ex partner: Not on file    Emotionally abused: Not on file  Physically abused: Not on file    Forced sexual activity: Not on file  Other Topics Concern  . Not on file  Social History Narrative   Retired Personal assistant   From [employer]   Married happily since [place] Reactions  . Xarelto [Rivaroxaban] Other (See Comments)    vertigo     Constitutional: Denies headache, fatigue, fever or abrupt weight changes.  HEENT:  Positive nasal congestion. Denies eye redness, ear pain, ringing in the ears, wax buildup, runny nose or bloody nose. Respiratory: Positive cough. Denies difficulty breathing or shortness of breath.  Cardiovascular: Denies chest pain, chest tightness, palpitations or swelling in the hands or feet.   No other specific complaints in a complete review of systems (except as listed in HPI above).  Objective:   BP (!) 148/84   Pulse 65   Temp 98.5 F (36.9 C) (Oral)   Wt 242 lb (109.8 kg)   SpO2 97%   BMI 29.46 kg/m   General: Appears his stated age, in NAD. HEENT: Head: normal shape and size, no sinus tenderness noted; Ears: Tm's gray and intact, normal light reflex; Nose: mucosa  boggy and moist, turbinates swollen; Throat/Mouth: + PND. Teeth present, mucosa erythematous and moist, no exudate noted, no lesions or ulcerations noted.  Neck:  No adenopathy noted.  Cardiovascular: Normal rate and rhythm.  Pulmonary/Chest: Normal effort and positive vesicular breath sounds, with bilateral expiratory wheezing noted. No respiratory distress. No rales or ronchi noted.       Assessment & Plan:   Acute URI with Cough:  Allergies likely a contributing factor Advised him to start Zyrtec nightly x 1 week Flonase 2 sprays each nostril for 3 days and then as needed. eRx for Amoxicillin TID for 7 days eRx for Pred Taper x 6 days   RTC as needed or if symptoms persist. [name], NP

## 2017-12-06 NOTE — Patient Instructions (Signed)

## 2017-12-08 ENCOUNTER — Encounter: Payer: Self-pay | Admitting: Internal Medicine

## 2018-02-13 ENCOUNTER — Emergency Department (HOSPITAL_COMMUNITY): Payer: Medicare Other

## 2018-02-13 ENCOUNTER — Other Ambulatory Visit: Payer: Self-pay

## 2018-02-13 ENCOUNTER — Encounter (HOSPITAL_COMMUNITY): Payer: Self-pay | Admitting: Emergency Medicine

## 2018-02-13 ENCOUNTER — Inpatient Hospital Stay (HOSPITAL_COMMUNITY)
Admission: EM | Admit: 2018-02-13 | Discharge: 2018-02-15 | DRG: 066 | Disposition: A | Discharge status: Home / Self Care | Payer: Medicare Other | Attending: Family Medicine | Admitting: Family Medicine

## 2018-02-13 ENCOUNTER — Other Ambulatory Visit (HOSPITAL_COMMUNITY): Payer: Self-pay

## 2018-02-13 DIAGNOSIS — Z823 Family history of stroke: Secondary | ICD-10-CM

## 2018-02-13 DIAGNOSIS — R4781 Slurred speech: Secondary | ICD-10-CM | POA: Diagnosis not present

## 2018-02-13 DIAGNOSIS — N400 Enlarged prostate without lower urinary tract symptoms: Secondary | ICD-10-CM | POA: Diagnosis present

## 2018-02-13 DIAGNOSIS — Z8249 Family history of ischemic heart disease and other diseases of the circulatory system: Secondary | ICD-10-CM

## 2018-02-13 DIAGNOSIS — I10 Essential (primary) hypertension: Secondary | ICD-10-CM | POA: Diagnosis present

## 2018-02-13 DIAGNOSIS — G459 Transient cerebral ischemic attack, unspecified: Secondary | ICD-10-CM | POA: Diagnosis not present

## 2018-02-13 DIAGNOSIS — R471 Dysarthria and anarthria: Secondary | ICD-10-CM | POA: Diagnosis not present

## 2018-02-13 DIAGNOSIS — Z6830 Body mass index (BMI) 30.0-30.9, adult: Secondary | ICD-10-CM

## 2018-02-13 DIAGNOSIS — M170 Bilateral primary osteoarthritis of knee: Secondary | ICD-10-CM | POA: Diagnosis present

## 2018-02-13 DIAGNOSIS — Z9889 Other specified postprocedural states: Secondary | ICD-10-CM | POA: Diagnosis not present

## 2018-02-13 DIAGNOSIS — I639 Cerebral infarction, unspecified: Secondary | ICD-10-CM | POA: Diagnosis present

## 2018-02-13 DIAGNOSIS — K219 Gastro-esophageal reflux disease without esophagitis: Secondary | ICD-10-CM | POA: Diagnosis present

## 2018-02-13 DIAGNOSIS — Z87891 Personal history of nicotine dependence: Secondary | ICD-10-CM

## 2018-02-13 DIAGNOSIS — E785 Hyperlipidemia, unspecified: Secondary | ICD-10-CM | POA: Diagnosis not present

## 2018-02-13 DIAGNOSIS — R29818 Other symptoms and signs involving the nervous system: Secondary | ICD-10-CM | POA: Diagnosis not present

## 2018-02-13 DIAGNOSIS — R4701 Aphasia: Secondary | ICD-10-CM | POA: Diagnosis not present

## 2018-02-13 DIAGNOSIS — I482 Chronic atrial fibrillation: Secondary | ICD-10-CM | POA: Diagnosis present

## 2018-02-13 DIAGNOSIS — Z902 Acquired absence of lung [part of]: Secondary | ICD-10-CM | POA: Diagnosis not present

## 2018-02-13 DIAGNOSIS — Z85118 Personal history of other malignant neoplasm of bronchus and lung: Secondary | ICD-10-CM | POA: Diagnosis not present

## 2018-02-13 DIAGNOSIS — I6521 Occlusion and stenosis of right carotid artery: Secondary | ICD-10-CM | POA: Diagnosis not present

## 2018-02-13 DIAGNOSIS — Z96653 Presence of artificial knee joint, bilateral: Secondary | ICD-10-CM | POA: Diagnosis present

## 2018-02-13 DIAGNOSIS — R2689 Other abnormalities of gait and mobility: Secondary | ICD-10-CM | POA: Diagnosis not present

## 2018-02-13 DIAGNOSIS — Z9221 Personal history of antineoplastic chemotherapy: Secondary | ICD-10-CM | POA: Diagnosis not present

## 2018-02-13 DIAGNOSIS — Z7901 Long term (current) use of anticoagulants: Secondary | ICD-10-CM | POA: Diagnosis not present

## 2018-02-13 DIAGNOSIS — I2510 Atherosclerotic heart disease of native coronary artery without angina pectoris: Secondary | ICD-10-CM | POA: Diagnosis not present

## 2018-02-13 DIAGNOSIS — E11319 Type 2 diabetes mellitus with unspecified diabetic retinopathy without macular edema: Secondary | ICD-10-CM | POA: Diagnosis not present

## 2018-02-13 DIAGNOSIS — Z79899 Other long term (current) drug therapy: Secondary | ICD-10-CM | POA: Diagnosis not present

## 2018-02-13 DIAGNOSIS — J449 Chronic obstructive pulmonary disease, unspecified: Secondary | ICD-10-CM | POA: Diagnosis present

## 2018-02-13 DIAGNOSIS — E669 Obesity, unspecified: Secondary | ICD-10-CM | POA: Diagnosis present

## 2018-02-13 DIAGNOSIS — E1159 Type 2 diabetes mellitus with other circulatory complications: Secondary | ICD-10-CM | POA: Diagnosis not present

## 2018-02-13 DIAGNOSIS — Z833 Family history of diabetes mellitus: Secondary | ICD-10-CM | POA: Diagnosis not present

## 2018-02-13 DIAGNOSIS — R29701 NIHSS score 1: Secondary | ICD-10-CM | POA: Diagnosis not present

## 2018-02-13 DIAGNOSIS — I4891 Unspecified atrial fibrillation: Secondary | ICD-10-CM | POA: Diagnosis not present

## 2018-02-13 DIAGNOSIS — I6302 Cerebral infarction due to thrombosis of basilar artery: Secondary | ICD-10-CM

## 2018-02-13 DIAGNOSIS — I441 Atrioventricular block, second degree: Secondary | ICD-10-CM | POA: Diagnosis not present

## 2018-02-13 DIAGNOSIS — G2581 Restless legs syndrome: Secondary | ICD-10-CM | POA: Diagnosis present

## 2018-02-13 DIAGNOSIS — Z888 Allergy status to other drugs, medicaments and biological substances status: Secondary | ICD-10-CM

## 2018-02-13 DIAGNOSIS — E119 Type 2 diabetes mellitus without complications: Secondary | ICD-10-CM

## 2018-02-13 DIAGNOSIS — R4789 Other speech disturbances: Secondary | ICD-10-CM | POA: Diagnosis not present

## 2018-02-13 LAB — DIFFERENTIAL
Abs Immature Granulocytes: 0 10*3/uL (ref 0.0–0.1)
BASOS PCT: 1 %
Basophils Absolute: 0 10*3/uL (ref 0.0–0.1)
EOS ABS: 0.2 10*3/uL (ref 0.0–0.7)
Eosinophils Relative: 3 %
Immature Granulocytes: 0 %
Lymphocytes Relative: 29 %
Lymphs Abs: 1.9 10*3/uL (ref 0.7–4.0)
Monocytes Absolute: 0.7 10*3/uL (ref 0.1–1.0)
Monocytes Relative: 10 %
NEUTROS PCT: 57 %
Neutro Abs: 3.7 10*3/uL (ref 1.7–7.7)

## 2018-02-13 LAB — COMPREHENSIVE METABOLIC PANEL
ALT: 22 U/L (ref 0–44)
AST: 21 U/L (ref 15–41)
Albumin: 3.9 g/dL (ref 3.5–5.0)
Alkaline Phosphatase: 47 U/L (ref 38–126)
Anion gap: 8 (ref 5–15)
BUN: 15 mg/dL (ref 8–23)
CO2: 25 mmol/L (ref 22–32)
CREATININE: 0.89 mg/dL (ref 0.61–1.24)
Calcium: 9.4 mg/dL (ref 8.9–10.3)
Chloride: 107 mmol/L (ref 98–111)
GFR calc Af Amer: >60 mL/min (ref >60)
GFR calc non Af Amer: >60 mL/min (ref >60)
Glucose, Bld: 206 mg/dL — ABNORMAL HIGH (ref 70–99)
POTASSIUM: 4.5 mmol/L (ref 3.5–5.1)
Sodium: 140 mmol/L (ref 135–145)
Total Bilirubin: 0.7 mg/dL (ref 0.3–1.2)
Total Protein: 6.6 g/dL (ref 6.5–8.1)

## 2018-02-13 LAB — I-STAT TROPONIN, ED: Troponin i, poc: 0 ng/mL (ref 0.00–0.08)

## 2018-02-13 LAB — I-STAT CHEM 8, ED
BUN: 17 mg/dL (ref 8–23)
Calcium, Ion: 1.2 mmol/L (ref 1.15–1.40)
Chloride: 104 mmol/L (ref 98–111)
Creatinine, Ser: 0.8 mg/dL (ref 0.61–1.24)
Glucose, Bld: 203 mg/dL — ABNORMAL HIGH (ref 70–99)
HCT: 45 % (ref 39.0–52.0)
Hemoglobin: 15.3 g/dL (ref 13.0–17.0)
Potassium: 4.4 mmol/L (ref 3.5–5.1)
Sodium: 141 mmol/L (ref 135–145)
TCO2: 26 mmol/L (ref 22–32)

## 2018-02-13 LAB — CBC
HCT: 47 % (ref 39.0–52.0)
Hemoglobin: 15.4 g/dL (ref 13.0–17.0)
MCH: 29.3 pg (ref 26.0–34.0)
MCHC: 32.8 g/dL (ref 30.0–36.0)
MCV: 89.4 fL (ref 78.0–100.0)
Platelets: 187 10*3/uL (ref 150–400)
RBC: 5.26 MIL/uL (ref 4.22–5.81)
RDW: 13.2 % (ref 11.5–15.5)
WBC: 6.5 10*3/uL (ref 4.0–10.5)

## 2018-02-13 LAB — APTT: aPTT: 35 seconds (ref 24–36)

## 2018-02-13 LAB — PROTIME-INR
INR: 1.13
Prothrombin Time: 14.4 seconds (ref 11.4–15.2)

## 2018-02-13 LAB — CBG MONITORING, ED: Glucose-Capillary: 194 mg/dL — ABNORMAL HIGH (ref 70–99)

## 2018-02-13 MED ORDER — ATORVASTATIN CALCIUM 80 MG PO TABS
80.0000 mg | ORAL_TABLET | Freq: Every day | ORAL | Status: DC
  Administered 2018-02-13 – 2018-02-14 (×2): 80 mg via ORAL
  Filled 2018-02-13 (×2): qty 1

## 2018-02-13 MED ORDER — METHOCARBAMOL 500 MG PO TABS: 500.0000 mg | ORAL_TABLET | Freq: Every evening | ORAL | Status: DC | PRN

## 2018-02-13 MED ORDER — APIXABAN 5 MG PO TABS
5.0000 mg | ORAL_TABLET | Freq: Two times a day (BID) | ORAL | Status: DC
  Administered 2018-02-13 – 2018-02-15 (×4): 5 mg via ORAL
  Filled 2018-02-13 (×4): qty 1

## 2018-02-13 MED ORDER — NAPROXEN SODIUM 275 MG PO TABS
275.0000 mg | ORAL_TABLET | Freq: Every day | ORAL | Status: DC | PRN
  Filled 2018-02-13 (×2): qty 1

## 2018-02-13 MED ORDER — DOCUSATE SODIUM 50 MG PO CAPS
50.0000 mg | ORAL_CAPSULE | Freq: Every day | ORAL | Status: DC | PRN
  Filled 2018-02-13: qty 1

## 2018-02-13 MED ORDER — OMEGA-3-ACID ETHYL ESTERS 1 G PO CAPS
1.0000 g | ORAL_CAPSULE | Freq: Two times a day (BID) | ORAL | Status: DC
  Administered 2018-02-13 – 2018-02-15 (×4): 1 g via ORAL
  Filled 2018-02-13 (×6): qty 1

## 2018-02-13 NOTE — H&P (Addendum)
[facility] Admission History and Physical Service Pager: [phone]  Patient name: Jonathan York Medical record number: [id number] Date of birth: 1933-06-04 Age: 84 y.o. Gender: male  Primary Care Provider: Tonia Ghent, MD Consultants: Neuro Code Status: Full code  Chief Complaint: 12 hr history of slurred speech and gait instability, now resolved  Assessment and Plan: Jonathan York is a 84 y.o. male presenting with 12 hr history starting at 7am yesterday of slurred speech and gait instability. PMH is significant for A Fib on Eliquis, HTN, HLD, DM, Hx Lung Cancer S/P Left Upper Lobe partial resection and chemo 2002, Carotid endarterectomy on Left 2013  Slurred speech and gait instability: Likely stroke Lasted 12 hours and symptoms completely resolved.  Patient is currently neurologically intact on exam and at baseline per son.  Patient has a history of A. fib and is anticoagulated on Eliquis.  Neuro was called in the ED and recommended admission as was a possible stroke due to symptoms lasting 12 hours.  CT head in the ED was negative for acute bleeding.  EKG in A Fib. -Admit to telemetry, Dr. Macario Golds service -Consult neurology, follow-up recommendations -Monitor vitals -Neurochecks q4 - MRI brain - MRA head and neck - Echo - cont Lipitor 80 - Lipid panel - Hgb A1c - TSH - PT/OT/Speech consult  A Fib: Chronic, on Eliquis Patient on eliquis and compliant.  In A fib on exam and per EKG.  Denies feelings of palpitations.  Follows with [facility] cardiology. Last Echo 11/25/2017, EF 60-65%. - Cont Eliquis - f/u Neuro recs, may repeat echo - consider Cardiology consultation  DM: Diet-controlled Last A1c 7.7 09/21/2017. Noted on chart review.  Patient not currently on any medication.  He states "my doctors that I have it and I say I do not." -recheck Hgb A1c -Monitor blood sugar on daily BMP -Consider sliding scale insulin as needed  HTN: Chronic On  amlodipine at home.  BP 179/97.  Permissive hypertension up to 220/110 -Continue to monitor blood pressure - Hold home amlodipine  HLD: Chronic On Lipitor and fish oil at home.  Last lipid panel unknown. -Continue home Lipitor and fish oil -Repeat lipid panel  Hx Lung Cancer S/P Left Upper Lobe partial resection and chemo 2002 Stable.  Lungs CTAB. No shortness of breath. - No intervention needed at this time.  Carotid endarterectomy on Left 2013 Carotid US in 2016 40% RICA stenosis with LICA patent.  No bruit on exam. - MRA head and neck - on high-intensity statin  FEN/GI: NPO pending swallow clearance then heart healthy carb modified diet Prophylaxis: Eliquis  Disposition: admit to inpatient, Telemetry  History of Present Illness:  Jonathan York is a 84 y.o. male presenting with hx slurred speech and gait instability x12 hrs.  He states that at 7am, he started to notice his speech became slurred and he began to stumble while walking.  He denied dizziness, falling, loss of consciousness, vision changes.  He stated that the symptoms lasted for about 12 hours and then completely resolved.  His son was present at the time of examination and had also seen him during the episode.  He notes that patient is not back at his baseline.  The patient notes that prior to yesterday he had been in his usual health.  He notes that he has never had a stroke before.  He says that he has been very stressed recently as his wife broke her hip and he has  been her primary caregiver.  He has a history of A. fib, is anticoagulated on Eliquis and is compliant with his medication.  He also has a history of a left carotid endarterectomy in 2011.  Review Of Systems: Per HPI with the following additions:   Review of Systems  Constitutional: Negative for chills, fever, malaise/fatigue and weight loss.  Eyes: Negative for blurred vision, discharge and redness.  Respiratory: Negative for cough and shortness of breath.    Cardiovascular: Negative for chest pain, palpitations and leg swelling.  Gastrointestinal: Negative for abdominal pain, blood in stool, constipation, diarrhea, heartburn, melena, nausea and vomiting.  Genitourinary: Negative for dysuria and hematuria.  Musculoskeletal: Negative for falls.  Neurological: Positive for speech change. Negative for dizziness, tingling, focal weakness, seizures, loss of consciousness and headaches.       Gait abnormality  Psychiatric/Behavioral: Negative for memory loss.    Patient Active Problem List   Diagnosis Date Noted  . CVA (cerebral vascular accident) ([place]) 02/13/2018  . Diabetic retinopathy ([place]) 10/11/2017  . Constipation 02/18/2017  . GERD (gastroesophageal reflux disease) 01/21/2017  . Lower urinary tract symptoms (LUTS) 01/02/2015  . Seborrheic keratoses 01/25/2014  . Advance care planning 01/25/2014  . Back pain 01/25/2013  . Second degree AV block, Mobitz type I 09/18/2012  . Occlusion and stenosis of carotid artery without mention of cerebral infarction 05/12/2012  . A-fib ([place]) 05/05/2012  . Left carotid bruit 05/05/2012  . Abdominal bruit 05/05/2012  . Skin lesion 01/26/2012  . Cough 12/18/2011  . Medicare annual wellness visit, subsequent 11/03/2011  . Vertigo 11/02/2011  . Hyperlipidemia with target LDL less than 100 11/19/2010  . CHRONIC OBSTRUCTIVE PULMONARY DISEASE 01/01/2010  . 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 RESTLESS LEG SYNDROME 07/01/2009  . NEOPLASM OF UNCERTAIN BEHAVIOR OF SKIN 10/18/2008  . Type 2 diabetes mellitus with ophthalmic complication ([place]) 89/38/1017  . Essential hypertension 03/17/2007  . HEMORRHOIDS 03/17/2007  . BENIGN PROSTATIC HYPERTROPHY 03/17/2007  . DEGENERATIVE JOINT DISEASE, KNEES, BILATERAL 03/17/2007  . 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 Abdominal bruit 05/05/2012  . Arthritis    back pain, past lumbar surgery   .  BPH (benign prostatic hypertrophy)   . Carotid arterial disease (HCC)    a. s/p left-sided CEA  . Chronic atrial fibrillation ([place])    a. CHADS2VASc => 5 (HTN, age x 2, DM, vascular disease); Eliquis  . Diabetes mellitus type II    diet controlled   . HLD (hyperlipidemia)   . HTN (hypertension)   . Lung cancer [facility])    a. s/p resection and chemo in 2002    Past Surgical History: Past Surgical History:  Procedure Laterality Date  . ARCH AORTOGRAM  05/27/2012   Procedure: ARCH AORTOGRAM;  Surgeon: Elam Dutch, MD;  Location: [facility] CATH LAB;  Service: Cardiovascular;;  . CARDIOVERSION  06/29/2012   Procedure: CARDIOVERSION;  Surgeon: Thayer Headings, MD;  Location: [facility] ENDOSCOPY;  Service: Cardiovascular;  Laterality: N/A;  . CAROTID ANGIOGRAM N/A 05/27/2012   Procedure: CAROTID Cyril Loosen;  Surgeon: Elam Dutch, MD;  Location: [facility] CATH LAB;  Service: Cardiovascular;  Laterality: N/A;  . CAROTID ENDARTERECTOMY Left 05/30/12  . CATARACT EXTRACTION     right  . ENDARTERECTOMY  05/30/2012   Procedure: ENDARTERECTOMY CAROTID;  Surgeon: Elam Dutch, MD;  Location: [place];  Service: Vascular;  Laterality: Left;  . EYE SURGERY     Jonathan detached retina-  1990's, IOL implants also   . [place]  . JOINT REPLACEMENT     knee- bilateral- 2009 and 2012  . LOBECTOMY  03/15/01   [facility])  . RETINAL DETACHMENT SURGERY     right  . SPINE SURGERY  approx 2006   Ruptered Disc  . TONSILLECTOMY     1939  . TOTAL KNEE ARTHROPLASTY     x2 per Dr. Rush Farmer  . WEDGE RESECTION  03/15/01   [facility])    Social History: Social History   Tobacco Use  . Smoking status: Former Smoker    Packs/day: 1.00    Years: 32.00    Pack years: 32.00    Types: Cigarettes    Last attempt to quit: 09/03/1982    Years since quitting: 35.4  . Smokeless tobacco: Never Used  Substance Use Topics  . Alcohol use: No    Alcohol/week: 0.0 oz    Comment:    . Drug use: No    Additional social history: lives with wife who recently broke her hip, he is caring for her at home Please also refer to relevant sections of EMR.  Family History: Family History  Problem Relation Age of Onset  . Emphysema Father   . Dementia Mother   . Diabetes Mother   . Hypertension Mother   . Hypothyroidism Sister   . Dementia Brother   . Hypertension Brother   . Diabetes Son   . Heart disease Son        Heart Disease before age 58- Open Heart 2010  . Hypertension Son   . Stroke Son        while on coumadin  . Prostate cancer Neg Hx   . Colon cancer Neg Hx    Allergies and Medications: Allergies  Allergen Reactions  . Xarelto [Rivaroxaban] Other (See Comments)    vertigo   No current facility-administered medications on file prior to encounter.    Current Outpatient Medications on File Prior to Encounter  Medication Sig Dispense Refill  . amLODipine (NORVASC) 5 MG tablet Take 1 tablet (5 mg total) by mouth daily. 90 tablet 3  . apixaban (ELIQUIS) 5 MG TABS tablet Take 1 tablet (5 mg total) by mouth 2 (two) times daily. 180 tablet 3  . atorvastatin (LIPITOR) 80 MG tablet TAKE ONE TABLET BY MOUTH AT BEDTIME 90 tablet 3  . Cinnamon 500 MG capsule Take 500 mg by mouth daily.    Mariane Baumgarten Calcium (STOOL SOFTENER PO) Take 1 tablet by mouth daily as needed (Constipation).    . fish oil-omega-3 fatty acids 1000 MG capsule Take 1 g by mouth 2 (two) times daily.     . methocarbamol (ROBAXIN) 500 MG tablet Take 1 tablet (500 mg total) by mouth at bedtime as needed (for restless legs). 50 tablet 2  . Multiple Vitamins-Minerals (PRESERVISION AREDS 2 PO) Take 1 tablet by mouth 2 (two) times daily. Preser Vision Vitamin  Take 2 capsule daily.    . naproxen sodium (ALEVE) 220 MG tablet Take 220 mg by mouth daily as needed (Pain).    . ramipril (ALTACE) 10 MG capsule Take 1 capsule (10 mg total) by mouth daily. 90 capsule 3  . tamsulosin (FLOMAX) 0.4 MG CAPS capsule TAKE ONE  CAPSULE BY MOUTH ONCE DAILY 90 capsule 3  . amoxicillin (AMOXIL) 500 MG capsule Take 1 capsule (500 mg total) by mouth 3 (three) times daily. (Patient not taking: Reported on 02/13/2018) 30 capsule 0  .  omeprazole (PRILOSEC) 20 MG capsule Take 1 capsule (20 mg total) by mouth daily. (Patient not taking: Reported on 02/13/2018) 30 capsule 3  . predniSONE (DELTASONE) 10 MG tablet Take 3 tabs on days 1-2, take 2 tabs on days 3-4, take 1 tab on days 5-6 (Patient not taking: Reported on 02/13/2018) 12 tablet 0    Objective: BP (!) 182/89   Pulse (!) 114   Temp 98.3 F (36.8 C) (Oral)   Resp (!) 25   Ht 6\' 3"  (1.905 m)   Wt 244 lb (110.7 kg)   SpO2 95%   BMI 30.50 kg/m   Physical Exam  Constitutional: He is oriented to person, place, and time. He appears well-developed and well-nourished. No distress.  HENT:  Head: Normocephalic and atraumatic.  Eyes: Pupils are equal, round, and reactive to light. EOM are normal.  Cardiovascular: Normal rate, regular rhythm and intact distal pulses. Exam reveals no gallop and no friction rub.  No murmur heard. Pulmonary/Chest: Effort normal and breath sounds normal. No respiratory distress. He has no wheezes. He has no rales.  Abdominal: Soft. Bowel sounds are normal. He exhibits no distension. There is no tenderness.  Musculoskeletal: He exhibits no edema.  Neurological: He is alert and oriented to person, place, and time. No cranial nerve deficit or sensory deficit. He exhibits normal muscle tone.  Skin: Skin is warm and dry. No rash noted. He is not diaphoretic.  Psychiatric: He has a normal mood and affect.    Labs and Imaging: CBC BMET  Recent Labs  Lab 02/13/18 1109 02/13/18 1120  WBC 6.5  --   HGB 15.4 15.3  HCT 47.0 45.0  PLT 187  --    Recent Labs  Lab 02/13/18 1109 02/13/18 1120  NA 140 141  K 4.5 4.4  CL 107 104  CO2 25  --   BUN 15 17  CREATININE 0.89 0.80  GLUCOSE 206* 203*  CALCIUM 9.4  --      Ct Head Wo  Contrast  Result Date: 02/13/2018 CLINICAL DATA:  Speech difficulty since yesterday. No reported injury. EXAM: CT HEAD WITHOUT CONTRAST TECHNIQUE: Contiguous axial images were obtained from the base of the skull through the vertex without intravenous contrast. COMPARISON:  None. FINDINGS: Brain: No evidence of parenchymal hemorrhage or extra-axial fluid collection. No mass lesion, mass effect, or midline shift. No CT evidence of acute infarction. Generalized cerebral volume loss. Nonspecific mild subcortical and periventricular white matter hypodensity, most in keeping with chronic small vessel ischemic change. No ventriculomegaly. Vascular: No acute abnormality. Skull: No evidence of calvarial fracture. Sinuses/Orbits: No fluid levels. Minimal partial opacification of the right ethmoidal air cells and inferior maxillary sinuses. Other:  The mastoid air cells are unopacified. IMPRESSION: 1.  No evidence of acute intracranial abnormality. 2. Generalized cerebral volume loss and mild chronic small vessel ischemic changes in the cerebral white matter. Electronically Signed   By: Ilona Sorrel M.D.   On: 02/13/2018 12:28     [place], Bernita Raisin, DO 02/13/2018, 4:51 PM PGY-1, [place] Intern pager: [phone], text pages welcome   FPTS Upper-Level Resident Addendum  I have independently interviewed and examined the patient. I have discussed the above with the original author and agree with their documentation. My edits for correction/addition/clarification are in pink.Please see also any attending notes.   Lucila Maine, DO PGY-3, [place] Service pager: [phone] (text pages welcome through [place])

## 2018-02-13 NOTE — Consult Note (Signed)
NEURO HOSPITALIST      Requesting Physician: Dr. Gwendlyn Deutscher    Chief Complaint: slurred speech/ unsteady gait  History obtained from:  Patient  / son  HPI:                                                                                                                                         [name] is an 84 y.o. male with a medical history of HTN, HLD, DM, BPH, carotid arterial disease s/p left CEA in 2013, afib on Eliquis, and lung cancer s/p resection and chemo 2002 and left carotid endarterectomy in 2013. who presents to [facility] for slurred speech and gait instability that started yesterday at 7 am.    Patient states that yesterday 02/12/18 he had a sudden onset of slurred speech and problems walking. Stated that his right arm was also weak. He had trouble picking up puzzle pieces.   He said " I had a stroke yesterday'. He did not come into the hospital because he is taking care of his wife who just had surgery and the dog. His son made him come into the hospital today. He states that his problems have resolved, but his son still made him come. Patient also states  That he will need ativan if they want an MRI. Denies any SOB, CP, HA, facial droop numbness, tingling or leg weakness. Denies any prior history of stroke.     ED course: BG: 194, BP:189/79 Ct head negative for any acute intracranial abnormality.   No previous stroke history noted.  Date last known well: Date: 02/13/2018 Time last known well: Time: 07:00 tPA Given: No: contraindicated on Eliquis and outside of window  Modified Rankin: Rankin Score=1  NIHSS:1 1a Level of Conscious:0 1b LOC Questions: 0 1c LOC Commands: 0 2 Best Gaze: 0 3 Visual: 0 4 Facial Palsy:1 5a Motor Arm - left: 0 5b Motor Arm - Right: 0 6a Motor Leg - Left: 0 6b Motor Leg - Right:0  7 Limb Ataxia: 0 8 Sensory:0  9 Best Language: 0 10 Dysarthria:0 11 Extinct. and Inattention:0 TOTAL:  1   Past Medical History:  Diagnosis Date  . Abdominal bruit 05/05/2012  . Arthritis    back pain, past lumbar surgery   . BPH (benign prostatic hypertrophy)   . Carotid arterial disease (HCC)    a. s/p left-sided CEA  . Chronic atrial fibrillation ([place])    a. CHADS2VASc => 5 (HTN, age x 2, DM, vascular disease); Eliquis  . Diabetes mellitus type II    diet controlled   . HLD (hyperlipidemia)   . HTN (hypertension)   .  Lung cancer [facility])    a. s/p resection and chemo in 2002    Past Surgical History:  Procedure Laterality Date  . ARCH AORTOGRAM  05/27/2012   Procedure: ARCH AORTOGRAM;  Surgeon: Elam Dutch, MD;  Location: [facility] CATH LAB;  Service: Cardiovascular;;  . CARDIOVERSION  06/29/2012   Procedure: CARDIOVERSION;  Surgeon: Thayer Headings, MD;  Location: [facility] ENDOSCOPY;  Service: Cardiovascular;  Laterality: N/A;  . CAROTID ANGIOGRAM N/A 05/27/2012   Procedure: CAROTID Cyril Loosen;  Surgeon: Elam Dutch, MD;  Location: [facility] CATH LAB;  Service: Cardiovascular;  Laterality: N/A;  . CAROTID ENDARTERECTOMY Left 05/30/12  . CATARACT EXTRACTION     right  . ENDARTERECTOMY  05/30/2012   Procedure: ENDARTERECTOMY CAROTID;  Surgeon: Elam Dutch, MD;  Location: [place];  Service: Vascular;  Laterality: Left;  . EYE SURGERY     R detached retina- 1990's, IOL implants also   . [place]  . JOINT REPLACEMENT     knee- bilateral- 2009 and 2012  . LOBECTOMY  03/15/01   [facility])  . RETINAL DETACHMENT SURGERY     right  . SPINE SURGERY  approx 2006   Ruptered Disc  . TONSILLECTOMY     1939  . TOTAL KNEE ARTHROPLASTY     x2 per Dr. Rush Farmer  . WEDGE RESECTION  03/15/01   [facility])    Family History  Problem Relation Age of Onset  . Emphysema Father   . Dementia Mother   . Diabetes Mother   . Hypertension Mother   . Hypothyroidism Sister   . Dementia Brother   . Hypertension Brother   . Diabetes Son   . Heart disease Son        Heart  Disease before age 52- Open Heart 2010  . Hypertension Son   . Stroke Son        while on coumadin  . Prostate cancer Neg Hx   . Colon cancer Neg Hx    Social History:  reports that he quit smoking about 35 years ago. His smoking use included cigarettes. He has a 32.00 pack-year smoking history. He has never used smokeless tobacco. He reports that he does not drink alcohol or use drugs.  Allergies:  Allergies  Allergen Reactions  . Xarelto [Rivaroxaban] Other (See Comments)    vertigo    Medications:                                                                                                                           No current facility-administered medications for this encounter.    Current Outpatient Medications  Medication Sig Dispense Refill  . amLODipine (NORVASC) 5 MG tablet Take 1 tablet (5 mg total) by mouth daily. 90 tablet 3  . apixaban (ELIQUIS) 5 MG TABS tablet Take 1 tablet (5 mg total) by mouth 2 (two) times daily. 180 tablet 3  . atorvastatin (LIPITOR) 80 MG tablet TAKE ONE TABLET  BY MOUTH AT BEDTIME 90 tablet 3  . Cinnamon 500 MG capsule Take 500 mg by mouth daily.    Mariane Baumgarten Calcium (STOOL SOFTENER PO) Take 1 tablet by mouth daily as needed (Constipation).    . fish oil-omega-3 fatty acids 1000 MG capsule Take 1 g by mouth 2 (two) times daily.     . methocarbamol (ROBAXIN) 500 MG tablet Take 1 tablet (500 mg total) by mouth at bedtime as needed (for restless legs). 50 tablet 2  . Multiple Vitamins-Minerals (PRESERVISION AREDS 2 PO) Take 1 tablet by mouth 2 (two) times daily. Preser Vision Vitamin  Take 2 capsule daily.    . naproxen sodium (ALEVE) 220 MG tablet Take 220 mg by mouth daily as needed (Pain).    . ramipril (ALTACE) 10 MG capsule Take 1 capsule (10 mg total) by mouth daily. 90 capsule 3  . tamsulosin (FLOMAX) 0.4 MG CAPS capsule TAKE ONE CAPSULE BY MOUTH ONCE DAILY 90 capsule 3  . amoxicillin (AMOXIL) 500 MG capsule Take 1 capsule (500 mg total)  by mouth 3 (three) times daily. (Patient not taking: Reported on 02/13/2018) 30 capsule 0  . omeprazole (PRILOSEC) 20 MG capsule Take 1 capsule (20 mg total) by mouth daily. (Patient not taking: Reported on 02/13/2018) 30 capsule 3  . predniSONE (DELTASONE) 10 MG tablet Take 3 tabs on days 1-2, take 2 tabs on days 3-4, take 1 tab on days 5-6 (Patient not taking: Reported on 02/13/2018) 12 tablet 0     ROS:                                                                                                                                       History obtained from the patient  General ROS: negative for - chills, fatigue, fever, night sweats, weight gain or weight loss Ophthalmic ROS: negative for - blurry vision, double vision, eye pain or loss of vision Respiratory ROS: negative for - cough,  shortness of breath or wheezing Cardiovascular ROS: negative for - chest pain, dyspnea on exertion,  Musculoskeletal ROS: negative for - joint swelling or muscular weakness Neurological ROS: as noted in HPI   General Examination:                                                                                                      Blood pressure (!) 179/97, pulse (!) 39, temperature 98.3 F (36.8 C), temperature source Oral, resp. rate 19, height 6\' 3"  (1.905 m), weight 110.7  kg (244 lb), SpO2 95 %.  HEENT-  Normocephalic, no lesions, without obvious abnormality.  Normal external eye and conjunctiva.  Cardiovascular- S1-S2 audible, pulses palpable throughout   Lungs-no rhonchi or wheezing noted, no excessive working breathing.  Saturations within normal limits on RA Extremities- Warm, dry and intact Musculoskeletal-no joint tenderness, deformity or swelling Skin-warm and dry, intact  Neurological Examination Mental Status: Alert, oriented, to person/place/age/month/year/situation.  Speech fluent without evidence of aphasia.  Able to follow commands without difficulty. Cranial Nerves: II:  Visual fields  grossly normal,  III,IV, VI: ptosis not present, extra-ocular motions intact bilaterally, pupils equal, round, reactive to light and accommodation V,VII: smile asymmetric slight right facial droop, facial light touch sensation normal bilaterally VIII: hearing normal bilaterally IX,X: uvula rises symmetrically XI: bilateral shoulder shrug XII: midline tongue extension Motor: Right : Upper extremity   5/5    Left:     Upper extremity   5/5  Lower extremity   5/5     Lower extremity   5/5 Tone and bulk:normal tone throughout; no atrophy noted Sensory: light touch intact throughout, bilaterally Deep Tendon Reflexes: 2+ and symmetric biceps, patella Plantars: Right: downgoing   Left: downgoing Cerebellar: normal finger-to-nose, normal rapid alternating movements and normal heel-to-shin test Gait: deferred   Lab Results: Basic Metabolic Panel: Recent Labs  Lab 02/13/18 1109 02/13/18 1120  NA 140 141  K 4.5 4.4  CL 107 104  CO2 25  --   GLUCOSE 206* 203*  BUN 15 17  CREATININE 0.89 0.80  CALCIUM 9.4  --     CBC: Recent Labs  Lab 02/13/18 1109 02/13/18 1120  WBC 6.5  --   NEUTROABS 3.7  --   HGB 15.4 15.3  HCT 47.0 45.0  MCV 89.4  --   PLT 187  --     Lipid Panel: No results for input(s): CHOL, TRIG, HDL, CHOLHDL, VLDL, LDLCALC in the last 168 hours.  CBG: Recent Labs  Lab 02/13/18 1102  GLUCAP 194*    Imaging: Ct Head Wo Contrast  Result Date: 02/13/2018 CLINICAL DATA:  Speech difficulty since yesterday. No reported injury. EXAM: CT HEAD WITHOUT CONTRAST TECHNIQUE: Contiguous axial images were obtained from the base of the skull through the vertex without intravenous contrast. COMPARISON:  None. FINDINGS: Brain: No evidence of parenchymal hemorrhage or extra-axial fluid collection. No mass lesion, mass effect, or midline shift. No CT evidence of acute infarction. Generalized cerebral volume loss. Nonspecific mild subcortical and periventricular white matter  hypodensity, most in keeping with chronic small vessel ischemic change. No ventriculomegaly. Vascular: No acute abnormality. Skull: No evidence of calvarial fracture. Sinuses/Orbits: No fluid levels. Minimal partial opacification of the right ethmoidal air cells and inferior maxillary sinuses. Other:  The mastoid air cells are unopacified. IMPRESSION: 1.  No evidence of acute intracranial abnormality. 2. Generalized cerebral volume loss and mild chronic small vessel ischemic changes in the cerebral white matter. Electronically Signed   By: Ilona Sorrel M.D.   On: 02/13/2018 12:28       Laurey Morale, MSN, NP-C [facility] [phone]  02/13/2018, 5:14 PM   Attending physician note to follow with Assessment and plan .   Assessment: 84 y.o. male  with a medical history significant for HTN, HLD, DM, BPH, carotid arterial disease s/p left CEA in 2013, afib on Eliquis, and lung cancer s/p resection and chemo 2002 and left carotid endarterectomy in 2013. who presents to [facility] for slurred speech and gait instability that started yesterday  at 7 am.   TIA vs Stroke- further work up needed  Stroke Risk Factors - atrial fibrillation, diabetes mellitus, hyperlipidemia, hypertension and age    Recommend -- BP goal : Permissive HTN upto 180/110 mmHg for 24 hours --MRI Brain  --MRA of the head w/o and neck with contrast --Echocardiogram -- continue Eliquis --  Continue Lipitor 80mg  -- HgbA1c, fasting lipid panel -- PT consult, OT consult, Speech consult --Telemetry monitoring --Frequent neuro checks --Stroke swallow screen     --please page stroke NP  Or  PA  Or MD from 8am -4 pm  as this patient from this time will be  followed by the stroke.   You can look them up on [email or web address]  Password [email or web address]   NEUROHOSPITALIST ADDENDUM Seen and examined the patient today. I have reviewed the contents of history and physical exam as documented by PA/ARNP/Resident and agree with above  documentation.  I have discussed and formulated the above plan as documented. Jonathan to the note have been made as needed.    Karena Addison Omunique Pederson MD [facility] [phone]   If 7pm to 7am, please call on call as listed on AMION.

## 2018-02-13 NOTE — Progress Notes (Addendum)
Family Medicine Teaching Service Daily Progress Note Intern Pager: [phone]  Patient name: Jonathan York Medical record number: [id number] Date of birth: 22-Sep-1932 Age: 84 y.o. Gender: male  Primary Care Provider: Tonia Ghent, MD Consultants: Neuro Code Status: Full  Pt Overview and Major Events to Date:  7/28 Admitted with likely CVA  Assessment and Plan: R Makari Portman is a 84 yo male presenting with posible CVA following 12 hr episode of slurred speech and gait instability.  PMH is significant for A Fib on Eliquis, HTN, HLD, DM, Hx Lung Cancer S/P Left Upper Lobe partial resection and chemo in 2002, S/P carotid endarterectomy on Left 2013.  Slurred speech and gait instability: Likely stroke CTA Head, no occlusion or stenosis, CTA Neck, critical stenosis Right ICA, s/p left CEA widely patent.  Remains neurologically intact.  No further symptoms. Lipid panel LDL 89, HDL 49, total 156. A1c 7.9, and TSH 2.35. Neuro recommends further TIA vs CVA workup.  Remains neurologically intact on exam. - cont cardiac monitoring - f/u Neuro recs, appreciate recommendations. - d/c Neuro checks q4hr - f/u MRI - f/u Echo - f/u PT/OT/Speech  - pending Neuro recs can consult Vascular Surgery  A Fib: Chronic, on Eliquis Heart rate decreased to 30s last PM while he was sleeping, was asymptomatic.  Noted that this has happened in the past.  Continues to be in A fib on telemetry and irregular on exam. - cont Eliquis - f/u Neuro recs - f/u Echo  DM: Diet-controlled A1c 7.9 this AM. Glucose this AM 165. - start sSSI - cont to monitor on daily BMP - recommend f/u with PCP for starting DM control as outpatient  HTN: Chronic Permissive HTN up to 180/110 x 24hrs, per Neuro.  BP this AM 164/85. - cont to hold home amlodipine - cont to monitor BP  HLD: Chronic Lipid panel LDL 89, HDL 49, Total 156.  On Lipitor and fish oil at home. - cont home meds  Hx Lung Cancer S/P Left Upper Lobe partial  resection and chemo 2002 - no intervention needed  Hx Carotid Endarterectomy on Left 2013 CTA neck, left ICA remains patent. - f/u neuro recs - cont Lipitor  Multifocal Consolidation Right Upper Lobe Incidental finding on CTA Neck.  Patient denies shortness of breath, cough, hemoptysis.   - f/u CT chest as outpatient  FEN/GI: Heart healthy, carb modified PPx: On eliquis  Disposition: pending clinical improvement  Subjective:  Patient at baseline this AM.  Denies complaints and desires to go home.  Denies slurring of speech or gait instability.    Objective: Temp:  [97.5 F (36.4 C)-98.3 F (36.8 C)] 98.3 F (36.8 C) (07/29 0230) Pulse Rate:  [39-114] 113 (07/29 0230) Resp:  [14-25] 16 (07/28 2314) BP: (142-207)/(70-107) 164/85 (07/29 0230) SpO2:  [94 %-99 %] 98 % (07/29 0230) Weight:  [244 lb (110.7 kg)] 244 lb (110.7 kg) (07/28 1100)   Physical Exam: General: 84 y.o. y.o. male in NAD Cardio: Irregularly irregular, no murmur Lungs: CTAB, no wheezing, no rhonchi, no crackles Abdomen: Soft, non-tender to palpation, positive bowel sounds Skin: warm and dry Extremities: No edema Neuro: CN II-XII grossly intact, 5/5 strenght BUE/BLE, sensation intact  Laboratory: Recent Labs  Lab 02/13/18 1109 02/13/18 1120  WBC 6.5  --   HGB 15.4 15.3  HCT 47.0 45.0  PLT 187  --    Recent Labs  Lab 02/13/18 1109 02/13/18 1120  NA 140 141  K 4.5 4.4  CL 107  104  CO2 25  --   BUN 15 17  CREATININE 0.89 0.80  CALCIUM 9.4  --   PROT 6.6  --   BILITOT 0.7  --   ALKPHOS 47  --   ALT 22  --   AST 21  --   GLUCOSE 206* 203*     Imaging/Diagnostic Tests: Ct Angio Head W Or Wo Contrast  Result Date: 02/14/2018 CLINICAL DATA:  TIA symptoms. History of hypertension, hyperlipidemia, lung cancer, LEFT carotid endarterectomy. EXAM: CT ANGIOGRAPHY HEAD AND NECK TECHNIQUE: Multidetector CT imaging of the head and neck was performed using the standard protocol during bolus  administration of intravenous contrast. Multiplanar CT image reconstructions and MIPs were obtained to evaluate the vascular anatomy. Carotid stenosis measurements (when applicable) are obtained utilizing NASCET criteria, using the distal internal carotid diameter as the denominator. CONTRAST:  6mL ISOVUE-370 IOPAMIDOL (ISOVUE-370) INJECTION 76% COMPARISON:  CT HEAD February 05, 2018.  CT chest February 19, 2005. FINDINGS: CT HEAD FINDINGS BRAIN: No intraparenchymal hemorrhage, mass effect nor midline shift. The ventricles and sulci are normal for age. Patchy supratentorial white matter hypodensities less than expected for patient's age, though non-specific are most compatible with chronic small vessel ischemic disease. No acute large vascular territory infarcts. No abnormal extra-axial fluid collections. Basal cisterns are patent. VASCULAR: Moderate calcific atherosclerosis of the carotid siphons. SKULL: No skull fracture. No significant scalp soft tissue swelling. SINUSES/ORBITS: Scattered mucosal retention cysts without paranasal sinus air-fluid levels.The included ocular globes and orbital contents are non-suspicious. Old RIGHT medial orbital blowout fracture. Status post bilateral ocular lens implants are RIGHT scleral banding. OTHER: None. CTA NECK FINDINGS: AORTIC ARCH: Normal appearance of the thoracic arch, normal branch pattern. Moderate calcific atherosclerosis. The origins of the innominate, left Common carotid artery and subclavian artery are patent. RIGHT CAROTID SYSTEM: Common carotid artery is patent, mild atherosclerosis. Severe calcific atherosclerosis resulting in 1 cm segment critical stenosis RIGHT ICA origin, by NASCET criteria. ICA is patent with mild calcific atherosclerosis. LEFT CAROTID SYSTEM: Common carotid artery is patent. Patulous LEFT and carotid artery bulb consistent with endarterectomy, widely patent carotid bulb and ICA. VERTEBRAL ARTERIES:Mild stenosis RIGHT vertebral artery origin.  Mild calcific atherosclerosis vertebral arteries which are patent without flow-limiting stenosis. Streak artifact LEFT the for intradural segment from dental amalgam. SKELETON: No acute osseous process though bone windows have not been submitted. OTHER NECK: Soft tissues of the neck are nonacute though, not tailored for evaluation. Severe C5-6 and C6-7 spondylosis. UPPER CHEST: Spiculated consolidations RIGHT upper lobe. Centrilobular emphysema. CTA HEAD FINDINGS: ANTERIOR CIRCULATION: Patent cervical internal carotid arteries, petrous, cavernous and supra clinoid internal carotid arteries. Mild stenosis RIGHT supraclinoid ICA. Patent anterior communicating artery. Patent anterior and middle cerebral arteries. No large vessel occlusion, significant stenosis, contrast extravasation or aneurysm. POSTERIOR CIRCULATION: Patent vertebral arteries, vertebrobasilar junction and basilar artery, as well as main branch vessels. Atherosclerosis resulting in moderate tandem stenosis V4 segments. Patent posterior cerebral arteries. No large vessel occlusion, significant stenosis, contrast extravasation or aneurysm. VENOUS SINUSES: Major dural venous sinuses are patent though not tailored for evaluation on this angiographic examination. ANATOMIC VARIANTS: Hypoplastic LEFT A1 segment. DELAYED PHASE: No abnormal intracranial enhancement. MIP images reviewed. IMPRESSION: CT HEAD: 1. Negative CT HEAD with and without contrast for age. CTA NECK: 1. Critical stenosis RIGHT ICA. Status post LEFT carotid endarterectomy, widely patent. 2. Patent vertebral arteries. 3. Multifocal consolidation RIGHT upper lobe. Recommend CT chest with contrast on non emergent basis. CTA HEAD: 1. No emergent large  vessel occlusion or flow-limiting stenosis. Aortic Atherosclerosis (ICD10-I70.0). Emphysema (ICD10-J43.9). Electronically Signed   By: Elon Alas M.D.   On: 02/14/2018 04:58   Ct Head Wo Contrast  Result Date: 02/13/2018 CLINICAL DATA:   Speech difficulty since yesterday. No reported injury. EXAM: CT HEAD WITHOUT CONTRAST TECHNIQUE: Contiguous axial images were obtained from the base of the skull through the vertex without intravenous contrast. COMPARISON:  None. FINDINGS: Brain: No evidence of parenchymal hemorrhage or extra-axial fluid collection. No mass lesion, mass effect, or midline shift. No CT evidence of acute infarction. Generalized cerebral volume loss. Nonspecific mild subcortical and periventricular white matter hypodensity, most in keeping with chronic small vessel ischemic change. No ventriculomegaly. Vascular: No acute abnormality. Skull: No evidence of calvarial fracture. Sinuses/Orbits: No fluid levels. Minimal partial opacification of the right ethmoidal air cells and inferior maxillary sinuses. Other:  The mastoid air cells are unopacified. IMPRESSION: 1.  No evidence of acute intracranial abnormality. 2. Generalized cerebral volume loss and mild chronic small vessel ischemic changes in the cerebral white matter. Electronically Signed   By: Ilona Sorrel M.D.   On: 02/13/2018 12:28   Ct Angio Neck W Or Wo Contrast  Result Date: 02/14/2018 CLINICAL DATA:  TIA symptoms. History of hypertension, hyperlipidemia, lung cancer, LEFT carotid endarterectomy. EXAM: CT ANGIOGRAPHY HEAD AND NECK TECHNIQUE: Multidetector CT imaging of the head and neck was performed using the standard protocol during bolus administration of intravenous contrast. Multiplanar CT image reconstructions and MIPs were obtained to evaluate the vascular anatomy. Carotid stenosis measurements (when applicable) are obtained utilizing NASCET criteria, using the distal internal carotid diameter as the denominator. CONTRAST:  20mL ISOVUE-370 IOPAMIDOL (ISOVUE-370) INJECTION 76% COMPARISON:  CT HEAD February 05, 2018.  CT chest February 19, 2005. FINDINGS: CT HEAD FINDINGS BRAIN: No intraparenchymal hemorrhage, mass effect nor midline shift. The ventricles and sulci are normal  for age. Patchy supratentorial white matter hypodensities less than expected for patient's age, though non-specific are most compatible with chronic small vessel ischemic disease. No acute large vascular territory infarcts. No abnormal extra-axial fluid collections. Basal cisterns are patent. VASCULAR: Moderate calcific atherosclerosis of the carotid siphons. SKULL: No skull fracture. No significant scalp soft tissue swelling. SINUSES/ORBITS: Scattered mucosal retention cysts without paranasal sinus air-fluid levels.The included ocular globes and orbital contents are non-suspicious. Old RIGHT medial orbital blowout fracture. Status post bilateral ocular lens implants are RIGHT scleral banding. OTHER: None. CTA NECK FINDINGS: AORTIC ARCH: Normal appearance of the thoracic arch, normal branch pattern. Moderate calcific atherosclerosis. The origins of the innominate, left Common carotid artery and subclavian artery are patent. RIGHT CAROTID SYSTEM: Common carotid artery is patent, mild atherosclerosis. Severe calcific atherosclerosis resulting in 1 cm segment critical stenosis RIGHT ICA origin, by NASCET criteria. ICA is patent with mild calcific atherosclerosis. LEFT CAROTID SYSTEM: Common carotid artery is patent. Patulous LEFT and carotid artery bulb consistent with endarterectomy, widely patent carotid bulb and ICA. VERTEBRAL ARTERIES:Mild stenosis RIGHT vertebral artery origin. Mild calcific atherosclerosis vertebral arteries which are patent without flow-limiting stenosis. Streak artifact LEFT the for intradural segment from dental amalgam. SKELETON: No acute osseous process though bone windows have not been submitted. OTHER NECK: Soft tissues of the neck are nonacute though, not tailored for evaluation. Severe C5-6 and C6-7 spondylosis. UPPER CHEST: Spiculated consolidations RIGHT upper lobe. Centrilobular emphysema. CTA HEAD FINDINGS: ANTERIOR CIRCULATION: Patent cervical internal carotid arteries, petrous,  cavernous and supra clinoid internal carotid arteries. Mild stenosis RIGHT supraclinoid ICA. Patent anterior communicating artery. Patent anterior and  middle cerebral arteries. No large vessel occlusion, significant stenosis, contrast extravasation or aneurysm. POSTERIOR CIRCULATION: Patent vertebral arteries, vertebrobasilar junction and basilar artery, as well as main branch vessels. Atherosclerosis resulting in moderate tandem stenosis V4 segments. Patent posterior cerebral arteries. No large vessel occlusion, significant stenosis, contrast extravasation or aneurysm. VENOUS SINUSES: Major dural venous sinuses are patent though not tailored for evaluation on this angiographic examination. ANATOMIC VARIANTS: Hypoplastic LEFT A1 segment. DELAYED PHASE: No abnormal intracranial enhancement. MIP images reviewed. IMPRESSION: CT HEAD: 1. Negative CT HEAD with and without contrast for age. CTA NECK: 1. Critical stenosis RIGHT ICA. Status post LEFT carotid endarterectomy, widely patent. 2. Patent vertebral arteries. 3. Multifocal consolidation RIGHT upper lobe. Recommend CT chest with contrast on non emergent basis. CTA HEAD: 1. No emergent large vessel occlusion or flow-limiting stenosis. Aortic Atherosclerosis (ICD10-I70.0). Emphysema (ICD10-J43.9). Electronically Signed   By: Elon Alas M.D.   On: 02/14/2018 04:58    Nicolle Heward, Bernita Raisin, DO 02/14/2018, 6:40 AM PGY-1, [place] Intern pager: [phone], text pages welcome

## 2018-02-13 NOTE — ED Provider Notes (Signed)
[place] EMERGENCY DEPARTMENT Provider Note   CSN: [id number] Arrival date & time: 02/13/18  1050   History   Chief Complaint Chief Complaint  Patient presents with  . Aphasia    HPI R Giankarlo Leamer is a 84 y.o. male with a medical history of HTN, HLD, DM, BPH, carotid arterial disease s/p left CEA in 2013, afib on Eliquis, and lung cancer s/p resection and chemo who presents to the ED after the acute onset of slurred speech and gait instability that occurred yesterday at 7am (over 24 hours PTA). Mr. Hernandez reports that he was talking with a friend when his symptoms occurred. He was able to drive home despite the symptoms and felt much improved 30-45 minutes later. His son reports that last night the pt was still somewhat slurring his speech and having difficulty with balance, but appears completely back to normal now. Mr. Krueger denies any headache, changes in vision, or focal weakness.   Past Medical History:  Diagnosis Date  . Abdominal bruit 05/05/2012  . Arthritis    back pain, past lumbar surgery   . BPH (benign prostatic hypertrophy)   . Carotid arterial disease (HCC)    a. s/p left-sided CEA  . Chronic atrial fibrillation ([place])    a. CHADS2VASc => 5 (HTN, age x 2, DM, vascular disease); Eliquis  . Diabetes mellitus type II    diet controlled   . HLD (hyperlipidemia)   . HTN (hypertension)   . Lung cancer [facility])    a. s/p resection and chemo in 2002    Patient Active Problem List   Diagnosis Date Noted  . Diabetic retinopathy ([place]) 10/11/2017  . Constipation 02/18/2017  . GERD (gastroesophageal reflux disease) 01/21/2017  . Lower urinary tract symptoms (LUTS) 01/02/2015  . Seborrheic keratoses 01/25/2014  . Advance care planning 01/25/2014  . Back pain 01/25/2013  . Second degree AV block, Mobitz type I 09/18/2012  . Occlusion and stenosis of carotid artery without mention of cerebral infarction 05/12/2012  . A-fib ([place]) 05/05/2012  . Left carotid  bruit 05/05/2012  . Abdominal bruit 05/05/2012  . Skin lesion 01/26/2012  . Cough 12/18/2011  . Medicare annual wellness visit, subsequent 11/03/2011  . Vertigo 11/02/2011  . Hyperlipidemia with target LDL less than 100 11/19/2010  . CHRONIC OBSTRUCTIVE PULMONARY DISEASE 01/01/2010  . 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 RESTLESS LEG SYNDROME 07/01/2009  . NEOPLASM OF UNCERTAIN BEHAVIOR OF SKIN 10/18/2008  . Type 2 diabetes mellitus with ophthalmic complication ([place]) 97/08/6376  . Essential hypertension 03/17/2007  . HEMORRHOIDS 03/17/2007  . BENIGN PROSTATIC HYPERTROPHY 03/17/2007  . DEGENERATIVE JOINT DISEASE, KNEES, BILATERAL 03/17/2007  . 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 ARCH AORTOGRAM  05/27/2012   Procedure: ARCH AORTOGRAM;  Surgeon: Elam Dutch, MD;  Location: [facility] CATH LAB;  Service: Cardiovascular;;  . CARDIOVERSION  06/29/2012   Procedure: CARDIOVERSION;  Surgeon: Thayer Headings, MD;  Location: [facility] ENDOSCOPY;  Service: Cardiovascular;  Laterality: N/A;  . CAROTID ANGIOGRAM N/A 05/27/2012   Procedure: CAROTID Cyril Loosen;  Surgeon: Elam Dutch, MD;  Location: [facility] CATH LAB;  Service: Cardiovascular;  Laterality: N/A;  . CAROTID ENDARTERECTOMY Left 05/30/12  . CATARACT EXTRACTION     right  . ENDARTERECTOMY  05/30/2012   Procedure: ENDARTERECTOMY CAROTID;  Surgeon: Elam Dutch, MD;  Location: [place];  Service: Vascular;  Laterality: Left;  . EYE SURGERY     R detached retina-  1990's, IOL implants also   . [place]  . JOINT REPLACEMENT     knee- bilateral- 2009 and 2012  . LOBECTOMY  03/15/01   [facility])  . RETINAL DETACHMENT SURGERY     right  . SPINE SURGERY  approx 2006   Ruptered Disc  . TONSILLECTOMY     1939  . TOTAL KNEE ARTHROPLASTY     x2 per Dr. Rush Farmer  . WEDGE RESECTION  03/15/01   [facility])        Home Medications    Prior to Admission  medications   Medication Sig Start Date End Date Taking? Authorizing Provider  amLODipine (NORVASC) 5 MG tablet Take 1 tablet (5 mg total) by mouth daily. 02/19/17  Yes Tonia Ghent, MD  apixaban (ELIQUIS) 5 MG TABS tablet Take 1 tablet (5 mg total) by mouth 2 (two) times daily. 11/18/17  Yes Dunn, Ryan M, PA-C  atorvastatin (LIPITOR) 80 MG tablet TAKE ONE TABLET BY MOUTH AT BEDTIME 03/08/17  Yes Tonia Ghent, MD  Cinnamon 500 MG capsule Take 500 mg by mouth daily.   Yes [provider]  Docusate Calcium (STOOL SOFTENER PO) Take 1 tablet by mouth daily as needed (Constipation).   Yes [provider]  fish oil-omega-3 fatty acids 1000 MG capsule Take 1 g by mouth 2 (two) times daily.    Yes [provider]  methocarbamol (ROBAXIN) 500 MG tablet Take 1 tablet (500 mg total) by mouth at bedtime as needed (for restless legs). 01/19/17  Yes Tonia Ghent, MD  Multiple Vitamins-Minerals (PRESERVISION AREDS 2 PO) Take 1 tablet by mouth 2 (two) times daily. Preser Vision Vitamin  Take 2 capsule daily.   Yes [provider]  naproxen sodium (ALEVE) 220 MG tablet Take 220 mg by mouth daily as needed (Pain).   Yes [provider]  ramipril (ALTACE) 10 MG capsule Take 1 capsule (10 mg total) by mouth daily. 05/03/17  Yes Wellington Hampshire, MD  tamsulosin (FLOMAX) 0.4 MG CAPS capsule TAKE ONE CAPSULE BY MOUTH ONCE DAILY 10/12/17  Yes Tonia Ghent, MD  amoxicillin (AMOXIL) 500 MG capsule Take 1 capsule (500 mg total) by mouth 3 (three) times daily. Patient not taking: Reported on 02/13/2018 12/06/17   Jearld Fenton, NP  omeprazole (PRILOSEC) 20 MG capsule Take 1 capsule (20 mg total) by mouth daily. Patient not taking: Reported on 02/13/2018 01/19/17   Tonia Ghent, MD  predniSONE (DELTASONE) 10 MG tablet Take 3 tabs on days 1-2, take 2 tabs on days 3-4, take 1 tab on days 5-6 Patient not taking: Reported on 02/13/2018 12/06/17   Jearld Fenton, NP     Family History Family History  Problem Relation Age of Onset  . Emphysema Father   . Dementia Mother   . Diabetes Mother   . Hypertension Mother   . Hypothyroidism Sister   . Dementia Brother   . Hypertension Brother   . Diabetes Son   . Heart disease Son        Heart Disease before age 27- Open Heart 2010  . Hypertension Son   . Stroke Son        while on coumadin  . Prostate cancer Neg Hx   . Colon cancer Neg Hx     Social History Social History   Tobacco Use  . Smoking status: Former Smoker    Packs/day: 1.00    Years: 32.00    Pack  years: 32.00    Types: Cigarettes    Last attempt to quit: 09/03/1982    Years since quitting: 35.4  . Smokeless tobacco: Never Used  Substance Use Topics  . Alcohol use: No    Alcohol/week: 0.0 oz    Comment:    . Drug use: No     Allergies   Xarelto [rivaroxaban]   Review of Systems Review of Systems  Constitutional: Negative for chills and fever.  HENT: Negative for rhinorrhea, sore throat and trouble swallowing.   Eyes: Negative for visual disturbance.  Respiratory: Negative for cough and shortness of breath.   Cardiovascular: Negative for chest pain and leg swelling.  Gastrointestinal: Negative for abdominal pain, nausea and vomiting.  Genitourinary: Negative for difficulty urinating and dysuria.  Musculoskeletal: Negative for back pain and neck pain.  Skin: Negative for rash and wound.  Neurological: Positive for dizziness and speech difficulty. Negative for weakness, numbness and headaches.     Physical Exam Updated Vital Signs BP (!) 182/89   Pulse (!) 114   Temp 98.3 F (36.8 C) (Oral)   Resp (!) 25   Ht 6\' 3"  (1.905 m)   Wt 110.7 kg (244 lb)   SpO2 95%   BMI 30.50 kg/m   Physical Exam  Constitutional: He is oriented to person, place, and time. He appears well-developed and well-nourished. No distress.  HENT:  Head: Normocephalic and atraumatic.  Mouth/Throat: Oropharynx is clear and moist.   Eyes: Pupils are equal, round, and reactive to light. EOM are normal.  Neck: Normal range of motion.  Cardiovascular: Exam reveals no gallop and no friction rub.  No murmur heard. Irregularly irregular.  Pulmonary/Chest: Effort normal and breath sounds normal. No respiratory distress. He has no wheezes. He has no rales.  Abdominal: Soft. Bowel sounds are normal. He exhibits no distension. There is no tenderness.  Musculoskeletal: Normal range of motion.  Trace pedal edema.  Neurological: He is alert and oriented to person, place, and time. No cranial nerve deficit or sensory deficit. He exhibits normal muscle tone. Coordination normal.  Skin: Skin is warm and dry. Capillary refill takes less than 2 seconds. No rash noted.  Psychiatric: He has a normal mood and affect. His behavior is normal.     ED Treatments / Results  Labs (all labs ordered are listed, but only abnormal results are displayed) Labs Reviewed  COMPREHENSIVE METABOLIC PANEL - Abnormal; Notable for the following components:      Result Value   Glucose, Bld 206 (*)    All other components within normal limits  CBG MONITORING, ED - Abnormal; Notable for the following components:   Glucose-Capillary 194 (*)    All other components within normal limits  I-STAT CHEM 8, ED - Abnormal; Notable for the following components:   Glucose, Bld 203 (*)    All other components within normal limits  PROTIME-INR  APTT  CBC  DIFFERENTIAL  I-STAT TROPONIN, ED    EKG EKG Interpretation  Date/Time:  Sunday February 13 2018 12:54:51 EDT Ventricular Rate:  54 PR Interval:    QRS Duration: 97 QT Interval:  414 QTC Calculation: 393 R Axis:   -34 Text Interpretation:  Atrial fibrillation Left axis deviation Probable anteroseptal infarct, old Borderline ST elevation, lateral leads Confirmed by Elnora Morrison [phone] on 02/13/2018 1:47:22 PM   Radiology Ct Head Wo Contrast  Result Date: 02/13/2018 CLINICAL DATA:  Speech difficulty  since yesterday. No reported injury. EXAM: CT HEAD WITHOUT CONTRAST TECHNIQUE: Contiguous axial  images were obtained from the base of the skull through the vertex without intravenous contrast. COMPARISON:  None. FINDINGS: Brain: No evidence of parenchymal hemorrhage or extra-axial fluid collection. No mass lesion, mass effect, or midline shift. No CT evidence of acute infarction. Generalized cerebral volume loss. Nonspecific mild subcortical and periventricular white matter hypodensity, most in keeping with chronic small vessel ischemic change. No ventriculomegaly. Vascular: No acute abnormality. Skull: No evidence of calvarial fracture. Sinuses/Orbits: No fluid levels. Minimal partial opacification of the right ethmoidal air cells and inferior maxillary sinuses. Other:  The mastoid air cells are unopacified. IMPRESSION: 1.  No evidence of acute intracranial abnormality. 2. Generalized cerebral volume loss and mild chronic small vessel ischemic changes in the cerebral white matter. Electronically Signed   By: Ilona Sorrel M.D.   On: 02/13/2018 12:28    Procedures Procedures (including critical care time)  Medications Ordered in ED Medications - No data to display   Initial Impression / Assessment and Plan / ED Course  I have reviewed the triage vital signs and the nursing notes.  Pertinent labs & imaging results that were available during my care of the patient were reviewed by me and considered in my medical decision making (see chart for details).  R Doron Shake is a 84 y.o. male with a medical history of HTN, HLD, DM, BPH, carotid arterial disease s/p left CEA in 2013, afib on Eliquis, and lung cancer s/p resection and chemo who presents to the ED after the acute onset of slurred speech and gait instability that resolved over about 12 hours and occurred 24 hours PTA. Upon arrival to the ED, pt is afebrile and hemodynamically stable. Physical exam is significant for irregularly irregular heart rhythm  and no focal neurological deficits. Labs are significant for negative troponin. CT head is negative for bleed. Patient last had a carotid artery doppler in 2016 and an ECHO in 11/2017. Case discussed with neurology who believes this presentation is more consistent with CVA than TIA given the symptoms took about 12 hours to completely resolve. Neurology recommends admission. Case discussed with family medicine who will admit.   Final Clinical Impressions(s) / ED Diagnoses   Final diagnoses:  Dysarthria  Cerebrovascular accident (CVA), unspecified mechanism [facility])  Imbalance    ED Discharge Orders    None       Dorrell, Andree Elk, MD 02/13/18 1556    Elnora Morrison, MD 02/13/18 [phone]

## 2018-02-13 NOTE — ED Notes (Signed)
Patient transported to CT 

## 2018-02-13 NOTE — ED Triage Notes (Signed)
Pt reports slurring his speech and feeling off gait since yesterday. LKW 02/12/18 0700 while at the gym. His son at bedside states he could tell his speech was unclear and his balance was off but since then some of the symptoms have cleared. Pt is A./O answers questions appropriately.

## 2018-02-14 ENCOUNTER — Inpatient Hospital Stay (HOSPITAL_COMMUNITY): Payer: Medicare Other

## 2018-02-14 ENCOUNTER — Other Ambulatory Visit: Payer: Self-pay

## 2018-02-14 ENCOUNTER — Encounter (HOSPITAL_COMMUNITY): Payer: Self-pay

## 2018-02-14 DIAGNOSIS — I6521 Occlusion and stenosis of right carotid artery: Secondary | ICD-10-CM

## 2018-02-14 DIAGNOSIS — G459 Transient cerebral ischemic attack, unspecified: Secondary | ICD-10-CM

## 2018-02-14 DIAGNOSIS — R2689 Other abnormalities of gait and mobility: Secondary | ICD-10-CM

## 2018-02-14 DIAGNOSIS — I4891 Unspecified atrial fibrillation: Secondary | ICD-10-CM

## 2018-02-14 DIAGNOSIS — R4781 Slurred speech: Secondary | ICD-10-CM

## 2018-02-14 DIAGNOSIS — Z9889 Other specified postprocedural states: Secondary | ICD-10-CM

## 2018-02-14 DIAGNOSIS — E119 Type 2 diabetes mellitus without complications: Secondary | ICD-10-CM

## 2018-02-14 LAB — HEMOGLOBIN A1C
HEMOGLOBIN A1C: 7.9 % — AB (ref 4.8–5.6)
MEAN PLASMA GLUCOSE: 180.03 mg/dL

## 2018-02-14 LAB — CBC
HEMATOCRIT: 46.8 % (ref 39.0–52.0)
HEMOGLOBIN: 15.4 g/dL (ref 13.0–17.0)
MCH: 29.3 pg (ref 26.0–34.0)
MCHC: 32.9 g/dL (ref 30.0–36.0)
MCV: 89 fL (ref 78.0–100.0)
Platelets: 196 10*3/uL (ref 150–400)
RBC: 5.26 MIL/uL (ref 4.22–5.81)
RDW: 13.2 % (ref 11.5–15.5)
WBC: 6.8 10*3/uL (ref 4.0–10.5)

## 2018-02-14 LAB — ECHOCARDIOGRAM COMPLETE
Height: 75 in
Weight: 3904 oz

## 2018-02-14 LAB — BASIC METABOLIC PANEL
ANION GAP: 8 (ref 5–15)
BUN: 10 mg/dL (ref 8–23)
CHLORIDE: 101 mmol/L (ref 98–111)
CO2: 30 mmol/L (ref 22–32)
Calcium: 9.3 mg/dL (ref 8.9–10.3)
Creatinine, Ser: 0.89 mg/dL (ref 0.61–1.24)
GFR calc Af Amer: >60 mL/min (ref >60)
GFR calc non Af Amer: >60 mL/min (ref >60)
Glucose, Bld: 165 mg/dL — ABNORMAL HIGH (ref 70–99)
POTASSIUM: 4.7 mmol/L (ref 3.5–5.1)
SODIUM: 139 mmol/L (ref 135–145)

## 2018-02-14 LAB — TSH: TSH: 2.35 u[IU]/mL (ref 0.350–4.500)

## 2018-02-14 LAB — LIPID PANEL
Cholesterol: 156 mg/dL (ref 0–200)
HDL: 49 mg/dL (ref >40)
LDL CALC: 89 mg/dL (ref 0–99)
Total CHOL/HDL Ratio: 3.2 RATIO
Triglycerides: 88 mg/dL (ref <150)
VLDL: 18 mg/dL (ref 0–40)

## 2018-02-14 MED ORDER — ASPIRIN EC 81 MG PO TBEC
81.0000 mg | DELAYED_RELEASE_TABLET | Freq: Every day | ORAL | Status: DC
  Administered 2018-02-14 – 2018-02-15 (×2): 81 mg via ORAL
  Filled 2018-02-14 (×2): qty 1

## 2018-02-14 MED ORDER — IOPAMIDOL (ISOVUE-370) INJECTION 76%
50.0000 mL | Freq: Once | INTRAVENOUS | Status: AC | PRN
  Administered 2018-02-14: 50 mL via INTRAVENOUS

## 2018-02-14 MED ORDER — RAMIPRIL 5 MG PO CAPS
10.0000 mg | ORAL_CAPSULE | Freq: Every day | ORAL | Status: DC
  Administered 2018-02-14 – 2018-02-15 (×2): 10 mg via ORAL
  Filled 2018-02-14 (×2): qty 2

## 2018-02-14 MED ORDER — LORAZEPAM 1 MG PO TABS: 2.0000 mg | ORAL_TABLET | Freq: Once | ORAL | Status: AC | PRN

## 2018-02-14 MED ORDER — LORAZEPAM 1 MG PO TABS: 2.0000 mg | ORAL_TABLET | Freq: Once | ORAL | Status: DC

## 2018-02-14 NOTE — Care Management Note (Signed)
Case Management Note  Patient Details  Name: Jonathan York MRN: [id number] Date of Birth: May 21, 1933  Subjective/Objective:      Pt in to r/o CVA. He is from home with his spouse who he provides assist to.               Action/Plan: No f/u per PT. Awaiting OT evals. CM following for d/c needs, physician orders.   Expected Discharge Date:                  Expected Discharge Plan:  Home/Self Care  In-House Referral:     Discharge planning Services     Post Acute Care Choice:    Choice offered to:     DME Arranged:    DME Agency:     HH Arranged:    HH Agency:     Status of Service:  In process, will continue to follow  If discussed at Long Length of Stay Meetings, dates discussed:    Additional Comments:  Pollie Friar, RN 02/14/2018, 11:42 AM

## 2018-02-14 NOTE — Plan of Care (Signed)
  Problem: Clinical Measurements: Goal: Ability to maintain clinical measurements within normal limits will improve Outcome: Progressing Goal: Diagnostic test results will improve Outcome: Progressing   Problem: Clinical Measurements: Goal: Diagnostic test results will improve Outcome: Progressing   Problem: Nutrition: Goal: Adequate nutrition will be maintained Outcome: Progressing   Problem: Coping: Goal: Level of anxiety will decrease Outcome: Progressing

## 2018-02-14 NOTE — Progress Notes (Signed)
Paged Dr Pilar Plate , Informed him of patients  Low HR and pause. Will continue to monitor patient.

## 2018-02-14 NOTE — Progress Notes (Signed)
Inpatient Diabetes Program Recommendations  AACE/ADA: New Consensus Statement on Inpatient Glycemic Control (2019)  Target Ranges:  Prepandial:   less than 140 mg/dL      Peak postprandial:   less than 180 mg/dL (1-2 hours)      Critically ill patients:  140 - 180 mg/dL   Results for Jonathan York, Jonathan York (MRN [id number]) as of 02/14/2018 11:01  Ref. Range 02/13/2018 11:02  Glucose-Capillary Latest Ref Range: 70 - 99 mg/dL 194 (H)  Results for Jonathan York, Jonathan York (MRN [id number]) as of 02/14/2018 11:01  Ref. Range 02/14/2018 07:11  Glucose Latest Ref Range: 70 - 99 mg/dL 165 (H)  Hemoglobin A1C Latest Ref Range: 4.8 - 5.6 % 7.9 (H)   Review of Glycemic Control  Diabetes history: DM2 (diet controlled) Outpatient Diabetes medications: None Current orders for Inpatient glycemic control: None  Inpatient Diabetes Program Recommendations:  Correction (SSI): While inpatient, please consider ordering CBGs with Novolog 0-9 units TID with meals and Novolog 0-5 units QHS. HgbA1C: A1C 7.9% on 02/14/18. Patient needs to follow up with PCP, may need to start on DM medication as an outpatient.  Thanks, Barnie Alderman, RN, MSN, CDE Diabetes Coordinator Inpatient Diabetes Program [phone] (Team Pager from 8am to 5pm)

## 2018-02-14 NOTE — Progress Notes (Signed)
Patient received from ED via stretcher; oriented to room and unit routine. Patient is alert and cooperative.

## 2018-02-14 NOTE — Evaluation (Signed)
Physical Therapy Evaluation Patient Details Name: Jonathan York MRN: [id number] DOB: March 21, 1933 Today's Date: 02/14/2018   History of Present Illness  Patient is an 84 y/o male presenting to the ED on 02/13/18 with slurred speech and gait abnormality. CT head in the ED was negative for acute bleeding. PMH is significant for A Fib on Eliquis, HTN, HLD, DM, Hx Lung Cancer S/P Left Upper Lobe partial resection and chemo 2002, Carotid endarterectomy on Left 2013  Clinical Impression  Jonathan York is a very pleasant 84 y/o male admitted with the above listed diagnosis. Patient reports that prior to admission,he lives at home with his wife, of which he is currently the primary caregiver for, and was very active in the community without AD. Patient today only requiring min guard to supervision for mobility for general safety with no LOB or overt instability. Patient feels as if he is at baseline level of functioning. No further acute PT needs identified. PT to sign off. Thanks for the referral!    Follow Up Recommendations No PT follow up;Supervision - Intermittent    Equipment Recommendations  None recommended by PT    Recommendations for Other Services       Precautions / Restrictions Precautions Precautions: Fall Restrictions Weight Bearing Restrictions: No      Mobility  Bed Mobility Overal bed mobility: Modified Independent                Transfers Overall transfer level: Needs assistance Equipment used: None Transfers: Sit to/from Stand Sit to Stand: Supervision         General transfer comment: for general safety and immediate standing balance  Ambulation/Gait Ambulation/Gait assistance: Min guard;Supervision Gait Distance (Feet): 250 Feet Assistive device: None Gait Pattern/deviations: Step-through pattern;Decreased stride length Gait velocity: decreased   General Gait Details: supervision for general safety, no LOB or overt instability; able to quickly turn without  LOB  Stairs Stairs: Yes Stairs assistance: Supervision Stair Management: One rail Right;Alternating pattern;Forwards Number of Stairs: 3    Wheelchair Mobility    Modified Rankin (Stroke Patients Only)       Balance Overall balance assessment: Mild deficits observed, not formally tested                                           Pertinent Vitals/Pain Pain Assessment: No/denies pain    Home Living Family/patient expects to be discharged to:: Private residence Living Arrangements: Spouse/significant other Available Help at Discharge: Family;Available PRN/intermittently Type of Home: House Home Access: Stairs to enter Entrance Stairs-Rails: None Entrance Stairs-Number of Steps: (1-2) Home Layout: One level Home Equipment: Walker - 2 wheels;Cane - single point      Prior Function Level of Independence: Independent         Comments: goes to gym 5 days/wk, bowls 2 days/wk     Hand Dominance        Extremity/Trunk Assessment   Upper Extremity Assessment Upper Extremity Assessment: Overall WFL for tasks assessed    Lower Extremity Assessment Lower Extremity Assessment: Overall WFL for tasks assessed    Cervical / Trunk Assessment Cervical / Trunk Assessment: Normal  Communication   Communication: No difficulties  Cognition Arousal/Alertness: Awake/alert Behavior During Therapy: WFL for tasks assessed/performed Overall Cognitive Status: Within Functional Limits for tasks assessed  General Comments      Exercises     Assessment/Plan    PT Assessment Patent does not need any further PT services  PT Problem List         PT Treatment Interventions      PT Goals (Current goals can be found in the Care Plan section)  Acute Rehab PT Goals Patient Stated Goal: return home PT Goal Formulation: With patient Time For Goal Achievement: 02/21/18 Potential to Achieve Goals:  Good    Frequency     Barriers to discharge        Co-evaluation               AM-PAC PT "6 Clicks" Daily Activity  Outcome Measure Difficulty turning over in bed (including adjusting bedclothes, sheets and blankets)?: None Difficulty moving from lying on back to sitting on the side of the bed? : A Little Difficulty sitting down on and standing up from a chair with arms (e.g., wheelchair, bedside commode, etc,.)?: A Little Help needed moving to and from a bed to chair (including a wheelchair)?: A Little Help needed walking in hospital room?: A Little Help needed climbing 3-5 steps with a railing? : A Little 6 Click Score: 19    End of Session Equipment Utilized During Treatment: Gait belt Activity Tolerance: Patient tolerated treatment well Patient left: in chair;with call bell/phone within reach Nurse Communication: Mobility status PT Visit Diagnosis: Unsteadiness on feet (R26.81);Other abnormalities of gait and mobility (R26.89);Muscle weakness (generalized) (M62.81)    Time: 8099-8338 PT Time Calculation (min) (ACUTE ONLY): 22 min   Charges:   PT Evaluation $PT Eval Moderate Complexity: 1 Mod          Lanney Gins, PT, DPT 02/14/18 10:01 AM Pager: [phone]

## 2018-02-14 NOTE — Progress Notes (Signed)
Attending resident paged about pause of 3.04 seconds. Patient's HR decreased to 30s during that time. On call resident ordered EKG.

## 2018-02-14 NOTE — Progress Notes (Signed)
Echocardiogram 2D Echocardiogram has been performed.  02/14/2018 2:44 PM Maudry Mayhew, BS, RVT, RDCS, RDMS

## 2018-02-14 NOTE — Progress Notes (Signed)
Family Medicine Teaching Service Daily Progress Note Intern Pager: [phone]  Patient name: Jonathan York Medical record number: [id number] Date of birth: [birth year] Age: 84 y.o. Gender: male  Primary Care Provider: Tonia Ghent, MD Consultants: Neuro Code Status: Full  Pt Overview and Major Events to Date:  7/28 Admitted with likely CVA  Assessment and Plan: [name] is a 84 yo male presenting with posible CVA following 12 hr episode of slurred speech and gait instability.  PMH is significant for A Fib on Eliquis, HTN, HLD, DM, Hx Lung Cancer S/P Left Upper Lobe partial resection and chemo in 2002, S/P carotid endarterectomy on Left 2013.  Slurred speech and gait instability: Likely stroke CTA Head, no occlusion or stenosis, CTA Neck, critical stenosis Right ICA, s/p left CEA widely patent. MRI acute to subacute 6x8mm pontine infarct. Echo 60-65%, LA mod-severely dilated. - cont cardiac monitoring - f/u Neuro recs, appreciate recommendations. - f/u PT/OT/Speech  - consult vascular surgery for intervention  A Fib: Chronic, on Eliquis Heart rate again to 30s yesterday with "2-5 second pauses" per nurse.  Patient asymptomatic.  EKG showed A Fib without heart block, unchanged from previous. Echo 60-65%, LA mod-severely dilated. Patient follows regularly with cardiology. Continues to be in A Fib on telemetry.  - cont Eliquis - f/u Neuro recs - f/u Cardiology outpatient  DM: Diet-controlled A1c 7.9, goal <7. Glucose this AM 150.  - start sSSI - cont to monitor - recommend f/u with PCP for starting DM control as outpatient  HTN: Chronic BP this AM 159/91 - cont to hold home amlodipine, per neuro <220/120 permissive HTN, gradually normalize in 5-7 days - cont to monitor BP  HLD: Chronic Lipid panel LDL 89, HDL 49, Total 156.  On Lipitor and fish oil at home. - cont home meds  Hx Lung Cancer S/P Left Upper Lobe partial resection and chemo 2002 - no intervention needed  Hx  Carotid Endarterectomy on Left 2013 CTA neck, left ICA remains patent. - f/u neuro recs - cont Lipitor  Multifocal Consolidation Right Upper Lobe: Stable Incidental finding on CTA Neck.  Patient denies shortness of breath, cough, hemoptysis.   - f/u CT chest as outpatient  FEN/GI: Heart healthy, carb modified PPx: On eliquis  Disposition: pending clinical improvement  Subjective:  Patient continues to deny complaints.  States that he would like to go home as soon as possible.  Objective: Temp:  [97.6 F (36.4 C)-98.3 F (36.8 C)] 97.8 F (36.6 C) (07/29 1950) Pulse Rate:  [50-113] 54 (07/29 1950) Resp:  [16-19] 16 (07/29 1950) BP: (164-216)/(70-88) 206/75 (07/29 1950) SpO2:  [96 %-99 %] 98 % (07/29 1950)  Physical Exam: General: 84 y.o. y.o. male in NAD Cardio: irregularly irregular Lungs: CTAB, no wheezing, no rhonchi, no crackles Abdomen: Soft, non-tender to palpation, positive bowel sounds Skin: warm and dry Extremities: No edema Neuro: CN II-XII grossly intact, 5/5 strength BUE/BLE, sensation inact   Laboratory: Recent Labs  Lab 02/13/18 1109 02/13/18 1120 02/14/18 0711  WBC 6.5  --  6.8  HGB 15.4 15.3 15.4  HCT 47.0 45.0 46.8  PLT 187  --  196   Recent Labs  Lab 02/13/18 1109 02/13/18 1120 02/14/18 0711  NA 140 141 139  K 4.5 4.4 4.7  CL 107 104 101  CO2 25  --  30  BUN 15 17 10   CREATININE 0.89 0.80 0.89  CALCIUM 9.4  --  9.3  PROT 6.6  --   --  BILITOT 0.7  --   --   ALKPHOS 47  --   --   ALT 22  --   --   AST 21  --   --   GLUCOSE 206* 203* 165*     Imaging/Diagnostic Tests: Ct Angio Head W Or Wo Contrast  Result Date: 02/14/2018 CLINICAL DATA:  TIA symptoms. History of hypertension, hyperlipidemia, lung cancer, LEFT carotid endarterectomy. EXAM: CT ANGIOGRAPHY HEAD AND NECK TECHNIQUE: Multidetector CT imaging of the head and neck was performed using the standard protocol during bolus administration of intravenous contrast. Multiplanar  CT image reconstructions and MIPs were obtained to evaluate the vascular anatomy. Carotid stenosis measurements (when applicable) are obtained utilizing NASCET criteria, using the distal internal carotid diameter as the denominator. CONTRAST:  51mL ISOVUE-370 IOPAMIDOL (ISOVUE-370) INJECTION 76% COMPARISON:  CT HEAD February 05, 2018.  CT chest February 19, 2005. FINDINGS: CT HEAD FINDINGS BRAIN: No intraparenchymal hemorrhage, mass effect nor midline shift. The ventricles and sulci are normal for age. Patchy supratentorial white matter hypodensities less than expected for patient's age, though non-specific are most compatible with chronic small vessel ischemic disease. No acute large vascular territory infarcts. No abnormal extra-axial fluid collections. Basal cisterns are patent. VASCULAR: Moderate calcific atherosclerosis of the carotid siphons. SKULL: No skull fracture. No significant scalp soft tissue swelling. SINUSES/ORBITS: Scattered mucosal retention cysts without paranasal sinus air-fluid levels.The included ocular globes and orbital contents are non-suspicious. Old RIGHT medial orbital blowout fracture. Status post bilateral ocular lens implants are RIGHT scleral banding. OTHER: None. CTA NECK FINDINGS: AORTIC ARCH: Normal appearance of the thoracic arch, normal branch pattern. Moderate calcific atherosclerosis. The origins of the innominate, left Common carotid artery and subclavian artery are patent. RIGHT CAROTID SYSTEM: Common carotid artery is patent, mild atherosclerosis. Severe calcific atherosclerosis resulting in 1 cm segment critical stenosis RIGHT ICA origin, by NASCET criteria. ICA is patent with mild calcific atherosclerosis. LEFT CAROTID SYSTEM: Common carotid artery is patent. Patulous LEFT and carotid artery bulb consistent with endarterectomy, widely patent carotid bulb and ICA. VERTEBRAL ARTERIES:Mild stenosis RIGHT vertebral artery origin. Mild calcific atherosclerosis vertebral arteries which  are patent without flow-limiting stenosis. Streak artifact LEFT the for intradural segment from dental amalgam. SKELETON: No acute osseous process though bone windows have not been submitted. OTHER NECK: Soft tissues of the neck are nonacute though, not tailored for evaluation. Severe C5-6 and C6-7 spondylosis. UPPER CHEST: Spiculated consolidations RIGHT upper lobe. Centrilobular emphysema. CTA HEAD FINDINGS: ANTERIOR CIRCULATION: Patent cervical internal carotid arteries, petrous, cavernous and supra clinoid internal carotid arteries. Mild stenosis RIGHT supraclinoid ICA. Patent anterior communicating artery. Patent anterior and middle cerebral arteries. No large vessel occlusion, significant stenosis, contrast extravasation or aneurysm. POSTERIOR CIRCULATION: Patent vertebral arteries, vertebrobasilar junction and basilar artery, as well as main branch vessels. Atherosclerosis resulting in moderate tandem stenosis V4 segments. Patent posterior cerebral arteries. No large vessel occlusion, significant stenosis, contrast extravasation or aneurysm. VENOUS SINUSES: Major dural venous sinuses are patent though not tailored for evaluation on this angiographic examination. ANATOMIC VARIANTS: Hypoplastic LEFT A1 segment. DELAYED PHASE: No abnormal intracranial enhancement. MIP images reviewed. IMPRESSION: CT HEAD: 1. Negative CT HEAD with and without contrast for age. CTA NECK: 1. Critical stenosis RIGHT ICA. Status post LEFT carotid endarterectomy, widely patent. 2. Patent vertebral arteries. 3. Multifocal consolidation RIGHT upper lobe. Recommend CT chest with contrast on non emergent basis. CTA HEAD: 1. No emergent large vessel occlusion or flow-limiting stenosis. Aortic Atherosclerosis (ICD10-I70.0). Emphysema (ICD10-J43.9). Electronically Signed   By:  Elon Alas M.D.   On: 02/14/2018 04:58   Ct Angio Neck W Or Wo Contrast  Result Date: 02/14/2018 CLINICAL DATA:  TIA symptoms. History of hypertension,  hyperlipidemia, lung cancer, LEFT carotid endarterectomy. EXAM: CT ANGIOGRAPHY HEAD AND NECK TECHNIQUE: Multidetector CT imaging of the head and neck was performed using the standard protocol during bolus administration of intravenous contrast. Multiplanar CT image reconstructions and MIPs were obtained to evaluate the vascular anatomy. Carotid stenosis measurements (when applicable) are obtained utilizing NASCET criteria, using the distal internal carotid diameter as the denominator. CONTRAST:  60mL ISOVUE-370 IOPAMIDOL (ISOVUE-370) INJECTION 76% COMPARISON:  CT HEAD February 05, 2018.  CT chest February 19, 2005. FINDINGS: CT HEAD FINDINGS BRAIN: No intraparenchymal hemorrhage, mass effect nor midline shift. The ventricles and sulci are normal for age. Patchy supratentorial white matter hypodensities less than expected for patient's age, though non-specific are most compatible with chronic small vessel ischemic disease. No acute large vascular territory infarcts. No abnormal extra-axial fluid collections. Basal cisterns are patent. VASCULAR: Moderate calcific atherosclerosis of the carotid siphons. SKULL: No skull fracture. No significant scalp soft tissue swelling. SINUSES/ORBITS: Scattered mucosal retention cysts without paranasal sinus air-fluid levels.The included ocular globes and orbital contents are non-suspicious. Old RIGHT medial orbital blowout fracture. Status post bilateral ocular lens implants are RIGHT scleral banding. OTHER: None. CTA NECK FINDINGS: AORTIC ARCH: Normal appearance of the thoracic arch, normal branch pattern. Moderate calcific atherosclerosis. The origins of the innominate, left Common carotid artery and subclavian artery are patent. RIGHT CAROTID SYSTEM: Common carotid artery is patent, mild atherosclerosis. Severe calcific atherosclerosis resulting in 1 cm segment critical stenosis RIGHT ICA origin, by NASCET criteria. ICA is patent with mild calcific atherosclerosis. LEFT CAROTID SYSTEM:  Common carotid artery is patent. Patulous LEFT and carotid artery bulb consistent with endarterectomy, widely patent carotid bulb and ICA. VERTEBRAL ARTERIES:Mild stenosis RIGHT vertebral artery origin. Mild calcific atherosclerosis vertebral arteries which are patent without flow-limiting stenosis. Streak artifact LEFT the for intradural segment from dental amalgam. SKELETON: No acute osseous process though bone windows have not been submitted. OTHER NECK: Soft tissues of the neck are nonacute though, not tailored for evaluation. Severe C5-6 and C6-7 spondylosis. UPPER CHEST: Spiculated consolidations RIGHT upper lobe. Centrilobular emphysema. CTA HEAD FINDINGS: ANTERIOR CIRCULATION: Patent cervical internal carotid arteries, petrous, cavernous and supra clinoid internal carotid arteries. Mild stenosis RIGHT supraclinoid ICA. Patent anterior communicating artery. Patent anterior and middle cerebral arteries. No large vessel occlusion, significant stenosis, contrast extravasation or aneurysm. POSTERIOR CIRCULATION: Patent vertebral arteries, vertebrobasilar junction and basilar artery, as well as main branch vessels. Atherosclerosis resulting in moderate tandem stenosis V4 segments. Patent posterior cerebral arteries. No large vessel occlusion, significant stenosis, contrast extravasation or aneurysm. VENOUS SINUSES: Major dural venous sinuses are patent though not tailored for evaluation on this angiographic examination. ANATOMIC VARIANTS: Hypoplastic LEFT A1 segment. DELAYED PHASE: No abnormal intracranial enhancement. MIP images reviewed. IMPRESSION: CT HEAD: 1. Negative CT HEAD with and without contrast for age. CTA NECK: 1. Critical stenosis RIGHT ICA. Status post LEFT carotid endarterectomy, widely patent. 2. Patent vertebral arteries. 3. Multifocal consolidation RIGHT upper lobe. Recommend CT chest with contrast on non emergent basis. CTA HEAD: 1. No emergent large vessel occlusion or flow-limiting stenosis.  Aortic Atherosclerosis (ICD10-I70.0). Emphysema (ICD10-J43.9). Electronically Signed   By: Elon Alas M.D.   On: 02/14/2018 04:58    Leahmarie Gasiorowski, Bernita Raisin, DO 02/14/2018, 8:15 PM PGY-1, [facility] Intern pager: [phone], text pages welcome

## 2018-02-14 NOTE — Progress Notes (Signed)
Stroke book given to patient, explained, educated. Patient eager.

## 2018-02-14 NOTE — Progress Notes (Signed)
Patient bradycardic on monitor. Patient asleep. Respirations 18. Patient appears to be in no distress. Siderails up X 2, Call bell within reach. Safety maintained. Will continue to monitor.

## 2018-02-14 NOTE — Progress Notes (Signed)
SLP Cancellation Note  Patient Details Name: Jonathan York MRN: [id number] DOB: Apr 02, 1933   Cancelled treatment:       Reason Eval/Treat Not Completed: SLP screened, no needs identified, will sign off   Pt passed stroke swallow screen and is currently consuming regular diet with thin liquids without overt s/s of oropharyngeal dysphagia or aspiration.    [name] 02/14/2018, 1:12 PM

## 2018-02-14 NOTE — Progress Notes (Signed)
Telemetry called again, patient HR down in 30's again, associated with multiple pauses (2.50 and 2.0 seconds). Attending text paged and made aware. Awaiting response.

## 2018-02-14 NOTE — Progress Notes (Addendum)
STROKE TEAM PROGRESS NOTE   SUBJECTIVE (INTERVAL HISTORY) His son is at the bedside.  Patient is back to baseline.  CTA showed left CEA widely patent however right ICA critical stenosis.  MRI pending.  Continue Eliquis 5 mg twice daily at this time.  But will add aspirin 81.    OBJECTIVE Temp:  [97.5 F (36.4 C)-98.3 F (36.8 C)] 97.7 F (36.5 C) (07/29 0737) Pulse Rate:  [39-114] 56 (07/29 0808) Cardiac Rhythm: Atrial fibrillation (07/29 0809) Resp:  [14-25] 19 (07/29 0737) BP: (164-207)/(70-107) 169/82 (07/29 0808) SpO2:  [94 %-99 %] 96 % (07/29 0737)  CBC:  Recent Labs  Lab 02/13/18 1109 02/13/18 1120 02/14/18 0711  WBC 6.5  --  6.8  NEUTROABS 3.7  --   --   HGB 15.4 15.3 15.4  HCT 47.0 45.0 46.8  MCV 89.4  --  89.0  PLT 187  --  174    Basic Metabolic Panel:  Recent Labs  Lab 02/13/18 1109 02/13/18 1120 02/14/18 0711  NA 140 141 139  K 4.5 4.4 4.7  CL 107 104 101  CO2 25  --  30  GLUCOSE 206* 203* 165*  BUN 15 17 10   CREATININE 0.89 0.80 0.89  CALCIUM 9.4  --  9.3    Lipid Panel:     Component Value Date/Time   CHOL 156 02/14/2018 0711   TRIG 88 02/14/2018 0711   HDL 49 02/14/2018 0711   CHOLHDL 3.2 02/14/2018 0711   VLDL 18 02/14/2018 0711   LDLCALC 89 02/14/2018 0711   HgbA1c:  Lab Results  Component Value Date   HGBA1C 7.9 (H) 02/14/2018   Urine Drug Screen: No results found for: LABOPIA, COCAINSCRNUR, LABBENZ, AMPHETMU, THCU, LABBARB  Alcohol Level No results found for: ETH  IMAGING  Ct Angio Head W Or Wo Contrast Ct Angio Neck W Or Wo Contrast  02/14/2018 IMPRESSION:   CT HEAD:  Negative CT HEAD with and without contrast for age.   CTA NECK:  1. Critical stenosis RIGHT ICA. Status post LEFT carotid endarterectomy, widely patent.  2. Patent vertebral arteries.  3. Multifocal consolidation RIGHT upper lobe. Recommend CT chest with contrast on non emergent basis.   CTA HEAD:  1. No emergent large vessel occlusion or flow-limiting  stenosis.   Aortic Atherosclerosis (ICD10-I70.0).   Emphysema (ICD10-J43.9).    Ct Head Wo Contrast 02/13/2018 IMPRESSION:  1.  No evidence of acute intracranial abnormality.  2. Generalized cerebral volume loss and mild chronic small vessel ischemic changes in the cerebral white matter.    MRI Brain - pending  Transthoracic Echocardiogram - Left ventricle: The cavity size was normal. There was mild focal   basal hypertrophy of the septum. Systolic function was normal.   The estimated ejection fraction was in the range of 60% to 65%.   Wall motion was normal; there were no regional wall motion   abnormalities. The study was not technically sufficient to allow   evaluation of LV diastolic dysfunction due to atrial   fibrillation. - Aortic valve: Severely calcified annulus. Trileaflet; severely   thickened, severely calcified leaflets. Valve mobility was   restricted. There was trivial regurgitation. - Mitral valve: Calcified annulus. Moderate focal calcification of   the anterior leaflet. - Left atrium: The atrium was moderately to severely dilated. - Tricuspid valve: There was trivial regurgitation. - Pulmonic valve: There was trivial regurgitation.   PHYSICAL EXAM Vitals:   02/13/18 2350 02/14/18 0230 02/14/18 0737 02/14/18 0808  BP: Marland Kitchen)  164/88 (!) 164/85  (!) 169/82  Pulse: 68 (!) 113 (!) 50 (!) 56  Resp: 18  19   Temp: 97.7 F (36.5 C) 98.3 F (36.8 C) 97.7 F (36.5 C)   TempSrc: Oral Oral Oral   SpO2: 99% 98% 96%   Weight:      Height:        Temp:  [97.7 F (36.5 C)-98.3 F (36.8 C)] 97.8 F (36.6 C) (07/29 1950) Pulse Rate:  [50-113] 54 (07/29 1950) Resp:  [16-19] 16 (07/29 1950) BP: (164-216)/(70-88) 206/75 (07/29 1950) SpO2:  [96 %-99 %] 98 % (07/29 1950)  General - Well nourished, well developed, in no apparent distress.  Ophthalmologic - fundi not visualized due to noncooperation.  Cardiovascular - Regular rate and rhythm, not in afib.  Mental  Status -  Level of arousal and orientation to time, place, and person were intact. Language including expression, naming, repetition, comprehension was assessed and found intact. Attention span and concentration were normal. Fund of Knowledge was assessed and was intact.  Cranial Nerves II - XII - II - Visual field intact OU. III, IV, VI - Extraocular movements intact. V - Facial sensation intact bilaterally. VII - Facial movement intact bilaterally. VIII - Hearing & vestibular intact bilaterally. X - Palate elevates symmetrically. XI - Chin turning & shoulder shrug intact bilaterally. XII - Tongue protrusion intact  Motor Strength - The patient's strength was normal in all extremities and pronator drift was absent.  Bulk was normal and fasciculations were absent.   Motor Tone - Muscle tone was assessed at the neck and appendages and was normal.  Reflexes - The patient's reflexes were symmetrical in all extremities and he had no pathological reflexes.  Sensory - Light touch, temperature/pinprick were assessed and were symmetrical.    Coordination - The patient had normal movements in the hands and feet with no ataxia or dysmetria.  Tremor was absent.  Gait and Station - deferred.    ASSESSMENT/PLAN Jonathan York is a 84 y.o. male with history of HTN, HLD, DM, BPH, carotid arterial disease s/p left CEA in 2013, afib on Eliquis, and lung cancer s/p resection and chemo 2002 and left carotid endarterectomy in 2013 presenting with slurred speech and gait instability . He did not receive IV t-PA due to Eliquis and late presentation.  Stroke vs TIA:  MRI pending  Resultant  Back to baseline  CT head - Negative CT HEAD with and without contrast for age.   MRI head - pending  CTA H&N - Critical stenosis RIGHT ICA.  Carotid Doppler - CTA neck performed - carotid dopplers not indicated.  2D Echo - unremarkable  LDL - 89  HgbA1c - 7.9  VTE prophylaxis - Eliquis Diet Order            Diet heart healthy/carb modified Room service appropriate? Yes; Fluid consistency: Thin  Diet effective now          Eliquis (apixaban) daily prior to admission, now on Eliquis (apixaban) daily  Patient counseled to be compliant with his antithrombotic medications  Ongoing aggressive stroke risk factor management  Therapy recommendations:  pending  Disposition:  Pending  Hypertension  Stable . Permissive hypertension (OK if < 220/120) but gradually normalize in 5-7 days . Long-term BP goal normotensive  Hyperlipidemia  Lipid lowering medication PTA:  Lipitor 80 mg daily  LDL 89, goal < 70  Current lipid lowering medication: Lipitor 80 mg daily - consider adding Zetia  Continue  statin at discharge  Diabetes  HgbA1c 7.9, goal < 7.0  Uncontrolled  Other Stroke Risk Factors  Advanced age  Former cigarette smoker - quit  Obesity, Body mass index is 30.5 kg/m., recommend weight loss, diet and exercise as appropriate   Family hx stroke (son)  Carotid artery disease  Afib   Other Active Problems  Multifocal consolidation RIGHT upper lobe. Recommend CT chest with contrast on non emergent basis.     Hospital day # 1  ATTENDING NOTE: I reviewed above note and agree with the assessment and plan. I have made any additions or clarifications directly to the above note. Pt was seen and examined.   84 year old male with history of BPH, left ICA stenosis status post CEA 2013, A. fib on Eliquis.  Diabetes, hypertension, hyperlipidemia, lung cancer status post surgery and chemotherapy in 2002 admitted for episode of slurred speech, difficulty walking and right arm weakness.  Currently resolved, back to baseline.  CT no acute abnormality.  However CTA head and neck showed left ICA widely patent but right ICA critical stenosis.  MRI pending.  EF 60 to 65%.  LDL 89 and A1c 7.9.  Patient symptoms seem to involve left cerebral hemisphere however CTA head and neck  showed right ICA critical stenosis.  Will depends on the MRI findings.  If there is stroke on the right hemisphere, patient may need vascular surgery consultation to consider right  CEA within 2 weeks.  Otherwise, close follow-up with Dr. Eden Lathe at [facility] to consider elective right CEA.  Continue Eliquis and will add baby aspirin for further stroke prevention.  Continue Lipitor.  Continue stroke risk factor modification.  Due to right ICA stenosis, BP goal 1 30-1 50.  Currently BP on the high end, gradually resume home BP medication.  Will follow.  Rosalin Hawking, MD PhD Stroke Neurology 02/14/2018 9:41 PM     To contact Stroke Continuity provider, please refer to [email or web address] After hours, contact General Neurology

## 2018-02-14 NOTE — Progress Notes (Signed)
Paged for pt heart rate down to the 30s while sleeping.  Went to bedside to find pt awake and feeling well. Pt had no new complaints and reported low heart rates while sleeping in the past.  Pt was attended two medical staff members at that time who were preparing him to go for a head CT.  Pt had a mildly tachycardic radial pulse with an irregular rhythm at that time.   Will continue to monitor.

## 2018-02-14 NOTE — Progress Notes (Signed)
Occupational Therapy Evaluation Patient Details Name: Jonathan York MRN: [id number] DOB: [birth year] Today's Date: 02/14/2018    History of Present Illness Patient is an 84 y/o male presenting to the ED on 02/13/18 with slurred speech and gait abnormality. CT head in the ED was negative for acute bleeding. PMH is significant for A Fib on Eliquis, HTN, HLD, DM, Hx Lung Cancer S/P Left Upper Lobe partial resection and chemo 2002, Carotid endarterectomy on Left 2013   Clinical Impression   Pt currently ambulating without use of AD and performing overall self care tasks at mod I level with increased time. Pt reports, " I feel like my normal". He lives at home with wife and has several family members that live nearby to assist with IADLs if needed.  Pt appears to be at baseline level and does not require further OT intervention at this time. OT will sign off. Thank you for referral.     Follow Up Recommendations  No OT follow up;Supervision - Intermittent    Equipment Recommendations  None recommended by OT    Recommendations for Other Services Other (comment)(none at this time)     Precautions / Restrictions Precautions Precautions: Fall Restrictions Weight Bearing Restrictions: No      Mobility Bed Mobility Overal bed mobility: Modified Independent        Transfers Overall transfer level: Needs assistance Equipment used: None Transfers: Sit to/from Stand Sit to Stand: Supervision         General transfer comment: for general safety and immediate standing balance        ADL either performed or assessed with clinical judgement   ADL Overall ADL's : Modified independent             Vision Baseline Vision/History: Wears glasses Wears Glasses: At all times Patient Visual Report: No change from baseline              Pertinent Vitals/Pain Pain Assessment: No/denies pain     Hand Dominance Right   Extremity/Trunk Assessment Upper Extremity Assessment Upper  Extremity Assessment: Overall WFL for tasks assessed   Lower Extremity Assessment Lower Extremity Assessment: Overall WFL for tasks assessed   Cervical / Trunk Assessment Cervical / Trunk Assessment: Normal   Communication Communication Communication: No difficulties   Cognition Arousal/Alertness: Awake/alert Behavior During Therapy: WFL for tasks assessed/performed Overall Cognitive Status: Within Functional Limits for tasks assessed                     Home Living Family/patient expects to be discharged to:: Private residence Living Arrangements: Spouse/significant other Available Help at Discharge: Family;Available PRN/intermittently Type of Home: House Home Access: Stairs to enter [employer] of Steps: 1-2 Entrance Stairs-Rails: None Home Layout: One level     Bathroom Shower/Tub: Occupational psychologist: Handicapped height     Home Equipment: Environmental consultant - 2 wheels;Cane - single point          Prior Functioning/Environment Level of Independence: Independent        Comments: goes to gym 5 days/wk, bowls 2 days/wk              OT Treatment/Interventions:      OT Goals(Current goals can be found in the care plan section) Acute Rehab OT Goals Patient Stated Goal: return home OT Goal Formulation: With patient Time For Goal Achievement: 02/28/18 Potential to Achieve Goals: Good  OT Frequency:  AM-PAC PT "6 Clicks" Daily Activity     Outcome Measure Help from another person eating meals?: None Help from another person taking care of personal grooming?: None Help from another person toileting, which includes using toliet, bedpan, or urinal?: None Help from another person bathing (including washing, rinsing, drying)?: None Help from another person to put on and taking off regular upper body clothing?: None Help from another person to put on and taking off regular lower body clothing?: None 6 Click Score: 24   End  of Session    Activity Tolerance: Patient tolerated treatment well Patient left: in bed;with call bell/phone within reach;with bed alarm set  OT Visit Diagnosis: Muscle weakness (generalized) (M62.81)                Time: 2202-5427 OT Time Calculation (min): 12 min Charges:  OT General Charges $OT Visit: 1 Visit OT Evaluation $OT Eval Low Complexity: 1 Low   Naiomy Watters P, MS, OTR/L 02/14/2018, 3:05 PM

## 2018-02-14 NOTE — Discharge Instructions (Addendum)

## 2018-02-15 ENCOUNTER — Inpatient Hospital Stay (HOSPITAL_COMMUNITY): Payer: Medicare Other

## 2018-02-15 DIAGNOSIS — E785 Hyperlipidemia, unspecified: Secondary | ICD-10-CM

## 2018-02-15 DIAGNOSIS — I10 Essential (primary) hypertension: Secondary | ICD-10-CM

## 2018-02-15 DIAGNOSIS — I6521 Occlusion and stenosis of right carotid artery: Secondary | ICD-10-CM

## 2018-02-15 DIAGNOSIS — I6302 Cerebral infarction due to thrombosis of basilar artery: Secondary | ICD-10-CM

## 2018-02-15 DIAGNOSIS — E1159 Type 2 diabetes mellitus with other circulatory complications: Secondary | ICD-10-CM

## 2018-02-15 LAB — CBC
HCT: 46.8 % (ref 39.0–52.0)
Hemoglobin: 15.4 g/dL (ref 13.0–17.0)
MCH: 29.2 pg (ref 26.0–34.0)
MCHC: 32.9 g/dL (ref 30.0–36.0)
MCV: 88.6 fL (ref 78.0–100.0)
PLATELETS: 192 10*3/uL (ref 150–400)
RBC: 5.28 MIL/uL (ref 4.22–5.81)
RDW: 13.2 % (ref 11.5–15.5)
WBC: 6.7 10*3/uL (ref 4.0–10.5)

## 2018-02-15 LAB — BASIC METABOLIC PANEL
Anion gap: 8 (ref 5–15)
BUN: 13 mg/dL (ref 8–23)
CALCIUM: 9.1 mg/dL (ref 8.9–10.3)
CO2: 29 mmol/L (ref 22–32)
CREATININE: 0.93 mg/dL (ref 0.61–1.24)
Chloride: 100 mmol/L (ref 98–111)
GFR calc Af Amer: >60 mL/min (ref >60)
Glucose, Bld: 150 mg/dL — ABNORMAL HIGH (ref 70–99)
Potassium: 4.1 mmol/L (ref 3.5–5.1)
SODIUM: 137 mmol/L (ref 135–145)

## 2018-02-15 MED ORDER — ASPIRIN 81 MG PO TBEC: 81.0000 mg | DELAYED_RELEASE_TABLET | Freq: Every day | ORAL | 0 refills | Status: DC

## 2018-02-15 MED ORDER — LORAZEPAM 1 MG PO TABS
2.0000 mg | ORAL_TABLET | ORAL | Status: DC | PRN
  Administered 2018-02-15: 2 mg via ORAL
  Filled 2018-02-15: qty 2

## 2018-02-15 NOTE — Plan of Care (Signed)
  Problem: Education: Goal: Knowledge of General Education information will improve Description Including pain rating scale, medication(s)/side effects and non-pharmacologic comfort measures Outcome: Adequate for Discharge   Problem: Health Behavior/Discharge Planning: Goal: Ability to manage health-related needs will improve Outcome: Adequate for Discharge   Problem: Clinical Measurements: Goal: Ability to maintain clinical measurements within normal limits will improve Outcome: Adequate for Discharge Goal: Will remain free from infection Outcome: Adequate for Discharge Goal: Diagnostic test results will improve Outcome: Adequate for Discharge Goal: Respiratory complications will improve Outcome: Adequate for Discharge Goal: Cardiovascular complication will be avoided Outcome: Adequate for Discharge   Problem: Activity: Goal: Risk for activity intolerance will decrease Outcome: Adequate for Discharge   Problem: Nutrition: Goal: Adequate nutrition will be maintained Outcome: Adequate for Discharge   Problem: Coping: Goal: Level of anxiety will decrease Outcome: Adequate for Discharge   Problem: Elimination: Goal: Will not experience complications related to bowel motility Outcome: Adequate for Discharge Goal: Will not experience complications related to urinary retention Outcome: Adequate for Discharge   Problem: Pain Managment: Goal: General experience of comfort will improve Outcome: Adequate for Discharge   Problem: Safety: Goal: Ability to remain free from injury will improve Outcome: Adequate for Discharge   Problem: Education: Goal: Knowledge of disease or condition will improve Outcome: Adequate for Discharge Goal: Knowledge of secondary prevention will improve Outcome: Adequate for Discharge Goal: Knowledge of patient specific risk factors addressed and post discharge goals established will improve Outcome: Adequate for Discharge   Problem: Health  Behavior/Discharge Planning: Goal: Ability to manage health-related needs will improve Outcome: Adequate for Discharge   Problem: Self-Care: Goal: Ability to participate in self-care as condition permits will improve Outcome: Adequate for Discharge   Problem: Ischemic Stroke/TIA Tissue Perfusion: Goal: Complications of ischemic stroke/TIA will be minimized Outcome: Adequate for Discharge

## 2018-02-15 NOTE — Progress Notes (Signed)
STROKE TEAM PROGRESS NOTE   SUBJECTIVE (INTERVAL HISTORY) No family is at the bedside.  Patient is sitting in chair and told me that Dr. Scot York has visited him earlier. His MRI showed left pontine infarct. No infarct at right hemisphere.   OBJECTIVE Temp:  [97.6 F (36.4 C)-97.8 F (36.6 C)] 97.7 F (36.5 C) (07/30 0337) Pulse Rate:  [49-65] 64 (07/30 0923) Cardiac Rhythm: Atrial fibrillation (07/30 0815) Resp:  [14-16] 14 (07/30 0337) BP: (154-216)/(75-92) 173/91 (07/30 0923) SpO2:  [96 %-98 %] 96 % (07/30 0337)  CBC:  Recent Labs  Lab 02/13/18 1109  02/14/18 0711 02/15/18 0435  WBC 6.5  --  6.8 6.7  NEUTROABS 3.7  --   --   --   HGB 15.4   < > 15.4 15.4  HCT 47.0   < > 46.8 46.8  MCV 89.4  --  89.0 88.6  PLT 187  --  196 192   < > = values in this interval not displayed.    Basic Metabolic Panel:  Recent Labs  Lab 02/14/18 0711 02/15/18 0435  NA 139 137  K 4.7 4.1  CL 101 100  CO2 30 29  GLUCOSE 165* 150*  BUN 10 13  CREATININE 0.89 0.93  CALCIUM 9.3 9.1    Lipid Panel:     Component Value Date/Time   CHOL 156 02/14/2018 0711   TRIG 88 02/14/2018 0711   HDL 49 02/14/2018 0711   CHOLHDL 3.2 02/14/2018 0711   VLDL 18 02/14/2018 0711   LDLCALC 89 02/14/2018 0711   HgbA1c:  Lab Results  Component Value Date   HGBA1C 7.9 (H) 02/14/2018   Urine Drug Screen: No results found for: LABOPIA, COCAINSCRNUR, LABBENZ, AMPHETMU, THCU, LABBARB  Alcohol Level No results found for: ETH  IMAGING  Ct Angio Head W Or Wo Contrast Ct Angio Neck W Or Wo Contrast  02/14/2018 IMPRESSION:   CT HEAD:  Negative CT HEAD with and without contrast for age.   CTA NECK:  1. Critical stenosis RIGHT ICA. Status post LEFT carotid endarterectomy, widely patent.  2. Patent vertebral arteries.  3. Multifocal consolidation RIGHT upper lobe. Recommend CT chest with contrast on non emergent basis.   CTA HEAD:  1. No emergent large vessel occlusion or flow-limiting stenosis.    Aortic Atherosclerosis (ICD10-I70.0).   Emphysema (ICD10-J43.9).   Ct Head Wo Contrast 02/13/2018 IMPRESSION:  1.  No evidence of acute intracranial abnormality.  2. Generalized cerebral volume loss and mild chronic small vessel ischemic changes in the cerebral white matter.   MRI Brain 02/15/2018 IMPRESSION: 1. Moderately motion degraded examination. 2. Acute to subacute 6 x 11 mm pontine infarct. 3. Otherwise negative noncontrast MRI head for age.   Transthoracic Echocardiogram - Left ventricle: The cavity size was normal. There was mild focal   basal hypertrophy of the septum. Systolic function was normal.   The estimated ejection fraction was in the range of 60% to 65%.   Wall motion was normal; there were no regional wall motion   abnormalities. The study was not technically sufficient to allow   evaluation of LV diastolic dysfunction due to atrial   fibrillation. - Aortic valve: Severely calcified annulus. Trileaflet; severely   thickened, severely calcified leaflets. Valve mobility was   restricted. There was trivial regurgitation. - Mitral valve: Calcified annulus. Moderate focal calcification of   the anterior leaflet. - Left atrium: The atrium was moderately to severely dilated. - Tricuspid valve: There was trivial regurgitation. - Pulmonic  valve: There was trivial regurgitation.   PHYSICAL EXAM Vitals:   02/14/18 1950 02/15/18 0003 02/15/18 0337 02/15/18 0923  BP: (!) 206/75 (!) 154/92 (!) 159/91 (!) 173/91  Pulse: (!) 54 65 (!) 49 64  Resp: 16 16 14    Temp: 97.8 F (36.6 C) 97.6 F (36.4 C) 97.7 F (36.5 C)   TempSrc: Oral Oral Oral   SpO2: 98% 98% 96%   Weight:      Height:        Temp:  [97.6 F (36.4 C)-97.8 F (36.6 C)] 97.7 F (36.5 C) (07/30 0337) Pulse Rate:  [49-65] 64 (07/30 0923) Resp:  [14-16] 14 (07/30 0337) BP: (154-216)/(75-92) 173/91 (07/30 0923) SpO2:  [96 %-98 %] 96 % (07/30 0337)  General - Well nourished, well developed, in no  apparent distress.  Ophthalmologic - fundi not visualized due to noncooperation.  Cardiovascular - Regular rate and rhythm, not in afib.  Mental Status -  Level of arousal and orientation to time, place, and person were intact. Language including expression, naming, repetition, comprehension was assessed and found intact. Attention span and concentration were normal. Fund of Knowledge was assessed and was intact.  Cranial Nerves II - XII - II - Visual field intact OU. III, IV, VI - Extraocular movements intact. V - Facial sensation intact bilaterally. VII - Facial movement intact bilaterally. VIII - Hearing & vestibular intact bilaterally. X - Palate elevates symmetrically. XI - Chin turning & shoulder shrug intact bilaterally. XII - Tongue protrusion intact  Motor Strength - The patient's strength was normal in all extremities and pronator drift was absent.  Bulk was normal and fasciculations were absent.   Motor Tone - Muscle tone was assessed at the neck and appendages and was normal.  Reflexes - The patient's reflexes were symmetrical in all extremities and he had no pathological reflexes.  Sensory - Light touch, temperature/pinprick were assessed and were symmetrical.    Coordination - The patient had normal movements in the hands and feet with no ataxia or dysmetria.  Tremor was absent.  Gait and Station - deferred.    ASSESSMENT/PLAN [name] is a 84 y.o. male with history of HTN, HLD, DM, BPH, carotid arterial disease s/p left CEA in 2013, afib on Eliquis, and lung cancer s/p resection and chemo 2002 and left carotid endarterectomy in 2013 presenting with slurred speech and gait instability . He did not receive IV t-PA due to Eliquis and late presentation.  Stroke:  Left pontine infarct, likely small vessel disease with risk factors  Resultant  Back to baseline  CT head - Negative CT HEAD with and without contrast for age.   MRI head - left pontine  infarct  CTA H&N - Critical stenosis RIGHT ICA.  2D Echo - unremarkable  LDL - 89  HgbA1c - 7.9  VTE prophylaxis - Eliquis  Eliquis (apixaban) daily prior to admission, now on Eliquis (apixaban) daily and ASA 81mg  daily. Continue eliquis and ASA on discharge.  Patient counseled to be compliant with his antithrombotic medications  Ongoing aggressive stroke risk factor management  Therapy recommendations:  pending  Disposition:  Pending  Right ICA critical stenosis, asymptomatic  CTA showed right ICA critical stenosis  On ASA 81mg   Left ICA CEA patent  Asymptomatic for right ICA high grade stenosis at this time  Asked pt to close follow up with Dr. Oneida Alar as outpt   Afib on eliquis  Continue eliquis for stroke prevention  Rate controlled  Hypertension  Stable . Long-term BP goal normotensive  Hyperlipidemia  Lipid lowering medication PTA:  Lipitor 80 mg daily  LDL 89, goal < 70  Current lipid lowering medication: Lipitor 80 mg daily  Continue statin at discharge  Diabetes  HgbA1c 7.9, goal < 7.0  Uncontrolled  PCP close follow up  SSI  CBG monitoring  Other Stroke Risk Factors  Advanced age  Former cigarette smoker - quit  Obesity, Body mass index is 30.5 kg/m., recommend weight loss, diet and exercise as appropriate   Family hx stroke (son)  Other Active Problems  Multifocal consolidation RIGHT upper lobe. Recommend CT chest with contrast on non emergent basis.   Neurology will sign off. Please call with questions. Pt will follow up with stroke clinic NP at [facility] in about 4 weeks. Thanks for the consult.   Rosalin Hawking, MD PhD Stroke Neurology 02/15/2018 1:48 PM     To contact Stroke Continuity provider, please refer to [email or web address] After hours, contact General Neurology

## 2018-02-15 NOTE — Final Progress Note (Signed)
NURSING PROGRESS NOTE  Jonathan York [id number] Discharge Data: 02/15/2018 5:25 PM Attending Provider: Kinnie Feil, MD [email or web address], Jonathan Rising, MD     Jonathan York to be D/C'd Home per MD order.  Discussed with the patient the After Visit Summary and all questions fully answered. All IV's discontinued with no bleeding noted. All belongings returned to patient for patient to take home.   Last Vital Signs:  Blood pressure (!) 173/91, pulse 64, temperature 97.7 F (36.5 C), temperature source Oral, resp. rate 14, height 6\' 3"  (1.905 m), weight 110.7 kg (244 lb), SpO2 96 %.  Discharge Medication List Allergies as of 02/15/2018      Reactions   Xarelto [rivaroxaban] Other (See Comments)   vertigo      Medication List    TAKE these medications   amLODipine 5 MG tablet Commonly known as:  NORVASC Take 1 tablet (5 mg total) by mouth daily.   apixaban 5 MG Tabs tablet Commonly known as:  ELIQUIS Take 1 tablet (5 mg total) by mouth 2 (two) times daily.   aspirin 81 MG EC tablet Take 1 tablet (81 mg total) by mouth daily. Start taking on:  02/16/2018   atorvastatin 80 MG tablet Commonly known as:  LIPITOR TAKE ONE TABLET BY MOUTH AT BEDTIME   Cinnamon 500 MG capsule Take 500 mg by mouth daily.   fish oil-omega-3 fatty acids 1000 MG capsule Take 1 g by mouth 2 (two) times daily.   methocarbamol 500 MG tablet Commonly known as:  ROBAXIN Take 1 tablet (500 mg total) by mouth at bedtime as needed (for restless legs).   naproxen sodium 220 MG tablet Commonly known as:  ALEVE Take 220 mg by mouth daily as needed (Pain).   PRESERVISION AREDS 2 PO Take 1 tablet by mouth 2 (two) times daily. Preser Vision Vitamin  Take 2 capsule daily.   ramipril 10 MG capsule Commonly known as:  ALTACE Take 1 capsule (10 mg total) by mouth daily.   STOOL SOFTENER PO Take 1 tablet by mouth daily as needed (Constipation).   tamsulosin 0.4 MG Caps capsule Commonly known as:  FLOMAX TAKE ONE  CAPSULE BY MOUTH ONCE DAILY

## 2018-02-15 NOTE — Care Management Note (Addendum)
Case Management Note  Patient Details  Name: Ferdinand Revoir MRN: [id number] Date of Birth: [birth year]  Subjective/Objective:                    Action/Plan: Pt discharging home with self care. Pt has PCP, insurance and transportation home. Wife able to provide intermittent supervision at home.  Expected Discharge Date:  02/15/18               Expected Discharge Plan:  Home/Self Care  In-House Referral:     Discharge planning Services     Post Acute Care Choice:    Choice offered to:     DME Arranged:    DME Agency:     HH Arranged:    HH Agency:     Status of Service:  Completed, signed off  If discussed at [employer] of Stay Meetings, dates discussed:    Additional Comments:  Pollie Friar, RN 02/15/2018, 4:15 PM

## 2018-02-15 NOTE — Progress Notes (Addendum)
Called MRI to ask if patient was scheduled for MRI tonight they said yes. Still awaiting for there call.  Patients SBP was up because he was up moving around in room which appears to cause his SBP to go up, he is asymptomatic.

## 2018-02-15 NOTE — Consult Note (Signed)
Patient name: Jonathan York MRN: [id number] DOB: [birth year] Sex: male   REASON FOR CONSULT:    Stroke with right carotid stenosis.  The consult is requested by Dr. Gwendlyn Deutscher.  HPI:   Jonathan York is a pleasant 84 y.o. male, who was admitted 2 days ago with the acute onset of slurred speech and difficulty ambulating.  He also had some mild right arm weakness.  Patient is left-handed.  He went to the gym to give a friend of his a coupon at around 7 AM.  It was at that time that he began having some slurred speech that persisted throughout the day.  This was noted by his family.  He also states that he had some unsteady gait.  He had some very mild right upper extremity weakness.  The symptoms all completely resolved within 24 hours.  Of note, this patient underwent a left carotid endarterectomy in November 2013 by Dr. Ruta Hinds for a greater than 80% asymptomatic left carotid stenosis.  Patient has a history of atrial fibrillation and is on Eliquis.  Aspirin has been started since this admission.  Patient denies any previous history of stroke, TIAs, expressive or receptive aphasia, or amaurosis fugax.  Past Medical History:  Diagnosis Date  . Abdominal bruit 05/05/2012  . Arthritis    back pain, past lumbar surgery   . BPH (benign prostatic hypertrophy)   . Carotid arterial disease (HCC)    a. s/p left-sided CEA  . Chronic atrial fibrillation ([place])    a. CHADS2VASc => 5 (HTN, age x 2, DM, vascular disease); Eliquis  . Diabetes mellitus type II    diet controlled   . HLD (hyperlipidemia)   . HTN (hypertension)   . Lung cancer [facility])    a. s/p resection and chemo in 2002   Family History  Problem Relation Age of Onset  . Emphysema Father   . Dementia Mother   . Diabetes Mother   . Hypertension Mother   . Hypothyroidism Sister   . Dementia Brother   . Hypertension Brother   . Diabetes Son   . Heart disease Son        Heart Disease before age 21- Open Heart 2010  .  Hypertension Son   . Stroke Son        while on coumadin  . Prostate cancer Neg Hx   . Colon cancer Neg Hx     SOCIAL HISTORY: He quit smoking in 1984. Social History   Socioeconomic History  . Marital status: Married    Spouse name: Not on file  . Number of children: 3  . Years of education: Not on file  . Highest education level: Not on file  Occupational History  . Occupation: Fired then [employer]  Social Needs  . Financial resource strain: Not on file  . Food insecurity:    Worry: Not on file    Inability: Not on file  . Transportation needs:    Medical: Not on file    Non-medical: Not on file  Tobacco Use  . Smoking status: Former Smoker    Packs/day: 1.00    Years: 32.00    Pack years: 32.00    Types: Cigarettes    Last attempt to quit: 09/03/1982    Years since quitting: 35.4  . Smokeless tobacco: Never Used  Substance and Sexual Activity  . Alcohol use: No    Alcohol/week: 0.0 oz    Comment:    .  Drug use: No  . Sexual activity: Yes  Lifestyle  . Physical activity:    Days per week: Not on file    Minutes per session: Not on file  . Stress: Not on file  Relationships  . Social connections:    Talks on phone: Not on file    Gets together: Not on file    Attends religious service: Not on file    Active member of club or organization: Not on file    Attends meetings of clubs or organizations: Not on file    Relationship status: Not on file  . Intimate partner violence:    Fear of current or ex partner: Not on file    Emotionally abused: Not on file    Physically abused: Not on file    Forced sexual activity: Not on file  Other Topics Concern  . Not on file  Social History Narrative   Retired Personal assistant   From [employer]   Married happily since [place] Reactions  . Xarelto [Rivaroxaban] Other (See Comments)    vertigo    Current Facility-Administered Medications  Medication Dose Route Frequency Provider  Last Rate Last Dose  . apixaban (ELIQUIS) tablet 5 mg  5 mg Oral BID Meccariello, Bernita Raisin, DO   5 mg at 02/15/18 4332  . aspirin EC tablet 81 mg  81 mg Oral Daily Milus Banister C, DO   81 mg at 02/15/18 9518  . atorvastatin (LIPITOR) tablet 80 mg  80 mg Oral QHS Meccariello, Bernita Raisin, DO   80 mg at 02/14/18 2126  . docusate sodium (COLACE) capsule 50 mg  50 mg Oral Daily PRN Meccariello, Bernita Raisin, DO      . LORazepam (ATIVAN) tablet 2 mg  2 mg Oral PRN Matilde Haymaker, MD   2 mg at 02/15/18 0340  . methocarbamol (ROBAXIN) tablet 500 mg  500 mg Oral QHS PRN Meccariello, Bernita Raisin, DO      . naproxen sodium (ANAPROX) tablet 275 mg  275 mg Oral Daily PRN Meccariello, Bernita Raisin, DO      . omega-3 acid ethyl esters (LOVAZA) capsule 1 g  1 g Oral BID Meccariello, Bailey J, DO   1 g at 02/15/18 8416  . ramipril (ALTACE) capsule 10 mg  10 mg Oral Daily Rosalin Hawking, MD   10 mg at 02/15/18 6063    REVIEW OF SYSTEMS:  [X]  denotes positive finding, [ ]  denotes negative finding Cardiac  Comments:  Chest pain or chest pressure: x  he did have some left-sided chest pain 2 to 3 weeks ago but he attributed this to bowling.  Shortness of breath upon exertion:    Short of breath when lying flat:    Irregular heart rhythm:        Vascular    Pain in calf, thigh, or hip brought on by ambulation:    Pain in feet at night that wakes you up from your sleep:     Blood clot in your veins:    Leg swelling:         Pulmonary   he denies any hemoptysis.  Oxygen at home:    Productive cough:     Wheezing:         Neurologic    Sudden weakness in arms or legs:  x Right arm  Sudden numbness in arms or legs:     Sudden onset of difficulty speaking or slurred speech: x   Temporary  loss of vision in one eye:     Problems with dizziness:         Gastrointestinal    Blood in stool:     Vomited blood:         Genitourinary    Burning when urinating:     Blood in urine:        Psychiatric    Major depression:          Hematologic    Bleeding problems:    Problems with blood clotting too easily:        Skin    Rashes or ulcers:        Constitutional    Fever or chills:     PHYSICAL EXAM:   Vitals:   02/14/18 1950 02/15/18 0003 02/15/18 0337 02/15/18 0923  BP: (!) 206/75 (!) 154/92 (!) 159/91 (!) 173/91  Pulse: (!) 54 65 (!) 49 64  Resp: 16 16 14    Temp: 97.8 F (36.6 C) 97.6 F (36.4 C) 97.7 F (36.5 C)   TempSrc: Oral Oral Oral   SpO2: 98% 98% 96%   Weight:      Height:        GENERAL: The patient is a well-nourished male, in no acute distress. The vital signs are documented above. CARDIAC: There is a regular rate and rhythm.  VASCULAR: I do not detect carotid bruits. He has palpable femoral pulses bilaterally and palpable dorsalis pedis pulses bilaterally. He has no significant lower extremity swelling. He does have some hyperpigmentation bilaterally consistent with chronic venous insufficiency. PULMONARY: There is good air exchange bilaterally without wheezing or rales. ABDOMEN: Soft and non-tender with normal pitched bowel sounds.  MUSCULOSKELETAL: There are no major deformities or cyanosis. NEUROLOGIC: No focal weakness or paresthesias are detected. SKIN: There are no ulcers or rashes noted. PSYCHIATRIC: The patient has a normal affect.  DATA:    CT ANGIOGRAM NECK: I have reviewed the CT angiogram of the neck.  This shows a tight right internal carotid artery stenosis.  However, this stenosis is highly calcified and therefore I think it is somewhat difficult to determine the severity based on this study.  The patient has had previous left carotid endarterectomy which is widely patent.  The vertebral arteries are patent.  MRI: The patient has a 6 mm x 11 mm pontine infarct on the left.  MEDICAL ISSUES:   RIGHT CAROTID STENOSIS: This patient has a posterior circulation stroke with a small pontine infarct on the left (6 mm x 11 mm).  His right carotid stenosis would not  explain this finding.  He is status post left carotid endarterectomy which is widely patent.  Given that the stenosis on the right is highly calcified I think it is difficult to determine the severity of the stenosis based on his CT angiogram of the neck.  I have ordered a carotid duplex to further assess this.  He is now on aspirin.  He is also on a statin.  He is on Eliquis for atrial fibrillation.  If he has a greater than 80% right carotid stenosis that he may be considered for elective right carotid endarterectomy in the future although again I do not think this explains his current symptoms.  RIGHT UPPER LOBE CONSOLIDATION: An incidental finding on his CT angiogram of the neck is a multifocal consolidation of his right upper lobe.  A CT chest with contrast is recommended on a nonemergent basis by radiology.  The patient does have a history  of lung cancer back in 2002.  Deitra Mayo [facility] [phone]

## 2018-02-15 NOTE — Discharge Summary (Signed)
[facility] Discharge Summary  Patient name: Jonathan York Medical record number: [id number] Date of birth: 1932/12/14 Age: 84 y.o. Gender: male Date of Admission: 02/13/2018  Date of Discharge: 02/15/2018 Admitting Physician: Kinnie Feil, MD  Primary Care Provider: Tonia Ghent, MD Consultants: Neuro, Vascular  Indication for Hospitalization: History of recent Slurred speech, gait instability  Discharge Diagnoses/Problem List:  Slurred Speech and Gait Instability: Likely stroke Atrial Fibrillation DM HTN HLD Hx Lung Cancer S/P Left Upper Lobe Partial resection and chemo 2002 Hx Carotid Endarterectomy on Left 2013 Multifocal Consolidation Right Upper Lobe  Disposition: Home  Discharge Condition: Stable  Discharge Exam:  General: 84 y.o. y.o. male in NAD Cardio: irregularly irregular Lungs: CTAB, no wheezing, no rhonchi, no crackles Abdomen: Soft, non-tender to palpation, positive bowel sounds Skin: warm and dry Extremities: No edema Neuro: CN II-XII grossly intact, 5/5 strength BUE/BLE, sensation inact   Brief Hospital Course:  [name] is a 84 y.o. male who presented to the ED the day after experiencing a 12hr period with slurred speech and gait instability.  At the time of presentation patient was asymptomatic and neurologically intact.  It had been over 24 hours since the start of his symptoms.  Neurology was consulted in the ED and recommended admission as this was a possible stroke due to the symptom duration of 12 hours.  His CT head at that time was negative for acute bleed.  Neurology recommended further imaging.  CTA of the head showed no occlusions or stenosis.  CTA neck showed critical stenosis of the right ICA, status post left CEA widely patent.  His echo showed an EF of 60 to 65%, LA moderately to severely dilated.  MRI of the head was also ordered and showed acute to subacute 6X 11 mm pontine infarct.  Neurology recommended  follow-up with vascular surgery for the patient's right ICA stenosis.  Vascular surgery was consulted and recommended vascular ultrasound.  Neurology and vascular surgery agreed that vascular ultrasound could be completed as an outpatient, this was confirmed by phone calls to both providers.  Because the patient's stroke was pontine, it was not in the division  that could have been caused by the stenosis of the right ICA, per neurology.  The patient will need to follow-up with his vascular surgeon Dr. Oneida Alar for vascular ultrasound and possible intervention for his right ICA stenosis.  The patient's A1c was also found to be elevated at 7.9.  It is recommended that he follow-up with his PCP for starting diabetes control.  [name] home dose of amlodipine was held throughout his admission to allow for permissive hypertension.  It was restarted at the time of discharge.  He will need follow-up of his blood pressure as an outpatient.  An incidental finding of a multifocal consolidation in the right upper lobe was seen on CTA of neck.  Patient denied shortness of breath, cough, hemoptysis.  He has a history of lung cancer in the left lung for which he had a left upper lobe partial resection and chemo in 2002.  He will need a follow-up CT chest as an outpatient.  At the time of discharge, patient was grossly neurologically intact and stable, had been without symptoms during hospitalization.   Issues for Follow Up:  1. Will need follow up at stroke clinic in 4 weeks. 2. Will need follow up with Dr. Oneida Alar and vascular ultrasound. 3. Will need CT chest as for follow up of multifocal  consolidation in right upper lobe. 4. Will need follow-up of blood pressure as patient was restarted on amlodipine prior to discharge.   5. Will need DM management as his A1c was 7.9.  Significant Procedures: None  Significant Labs and Imaging:  Recent Labs  Lab 02/13/18 1109 02/13/18 1120 02/14/18 0711 02/15/18 0435   WBC 6.5  --  6.8 6.7  HGB 15.4 15.3 15.4 15.4  HCT 47.0 45.0 46.8 46.8  PLT 187  --  196 192   Recent Labs  Lab 02/13/18 1109 02/13/18 1120 02/14/18 0711 02/15/18 0435  NA 140 141 139 137  K 4.5 4.4 4.7 4.1  CL 107 104 101 100  CO2 25  --  30 29  GLUCOSE 206* 203* 165* 150*  BUN 15 17 10 13   CREATININE 0.89 0.80 0.89 0.93  CALCIUM 9.4  --  9.3 9.1  ALKPHOS 47  --   --   --   AST 21  --   --   --   ALT 22  --   --   --   ALBUMIN 3.9  --   --   --     Ct Angio Head W Or Wo Contrast  Result Date: 02/14/2018 CLINICAL DATA:  TIA symptoms. History of hypertension, hyperlipidemia, lung cancer, LEFT carotid endarterectomy. EXAM: CT ANGIOGRAPHY HEAD AND NECK TECHNIQUE: Multidetector CT imaging of the head and neck was performed using the standard protocol during bolus administration of intravenous contrast. Multiplanar CT image reconstructions and MIPs were obtained to evaluate the vascular anatomy. Carotid stenosis measurements (when applicable) are obtained utilizing NASCET criteria, using the distal internal carotid diameter as the denominator. CONTRAST:  43mL ISOVUE-370 IOPAMIDOL (ISOVUE-370) INJECTION 76% COMPARISON:  CT HEAD February 05, 2018.  CT chest February 19, 2005. FINDINGS: CT HEAD FINDINGS BRAIN: No intraparenchymal hemorrhage, mass effect nor midline shift. The ventricles and sulci are normal for age. Patchy supratentorial white matter hypodensities less than expected for patient's age, though non-specific are most compatible with chronic small vessel ischemic disease. No acute large vascular territory infarcts. No abnormal extra-axial fluid collections. Basal cisterns are patent. VASCULAR: Moderate calcific atherosclerosis of the carotid siphons. SKULL: No skull fracture. No significant scalp soft tissue swelling. SINUSES/ORBITS: Scattered mucosal retention cysts without paranasal sinus air-fluid levels.The included ocular globes and orbital contents are non-suspicious. Old RIGHT medial  orbital blowout fracture. Status post bilateral ocular lens implants are RIGHT scleral banding. OTHER: None. CTA NECK FINDINGS: AORTIC ARCH: Normal appearance of the thoracic arch, normal branch pattern. Moderate calcific atherosclerosis. The origins of the innominate, left Common carotid artery and subclavian artery are patent. RIGHT CAROTID SYSTEM: Common carotid artery is patent, mild atherosclerosis. Severe calcific atherosclerosis resulting in 1 cm segment critical stenosis RIGHT ICA origin, by NASCET criteria. ICA is patent with mild calcific atherosclerosis. LEFT CAROTID SYSTEM: Common carotid artery is patent. Patulous LEFT and carotid artery bulb consistent with endarterectomy, widely patent carotid bulb and ICA. VERTEBRAL ARTERIES:Mild stenosis RIGHT vertebral artery origin. Mild calcific atherosclerosis vertebral arteries which are patent without flow-limiting stenosis. Streak artifact LEFT the for intradural segment from dental amalgam. SKELETON: No acute osseous process though bone windows have not been submitted. OTHER NECK: Soft tissues of the neck are nonacute though, not tailored for evaluation. Severe C5-6 and C6-7 spondylosis. UPPER CHEST: Spiculated consolidations RIGHT upper lobe. Centrilobular emphysema. CTA HEAD FINDINGS: ANTERIOR CIRCULATION: Patent cervical internal carotid arteries, petrous, cavernous and supra clinoid internal carotid arteries. Mild stenosis RIGHT supraclinoid ICA. Patent anterior communicating  artery. Patent anterior and middle cerebral arteries. No large vessel occlusion, significant stenosis, contrast extravasation or aneurysm. POSTERIOR CIRCULATION: Patent vertebral arteries, vertebrobasilar junction and basilar artery, as well as main branch vessels. Atherosclerosis resulting in moderate tandem stenosis V4 segments. Patent posterior cerebral arteries. No large vessel occlusion, significant stenosis, contrast extravasation or aneurysm. VENOUS SINUSES: Major dural  venous sinuses are patent though not tailored for evaluation on this angiographic examination. ANATOMIC VARIANTS: Hypoplastic LEFT A1 segment. DELAYED PHASE: No abnormal intracranial enhancement. MIP images reviewed. IMPRESSION: CT HEAD: 1. Negative CT HEAD with and without contrast for age. CTA NECK: 1. Critical stenosis RIGHT ICA. Status post LEFT carotid endarterectomy, widely patent. 2. Patent vertebral arteries. 3. Multifocal consolidation RIGHT upper lobe. Recommend CT chest with contrast on non emergent basis. CTA HEAD: 1. No emergent large vessel occlusion or flow-limiting stenosis. Aortic Atherosclerosis (ICD10-I70.0). Emphysema (ICD10-J43.9). Electronically Signed   By: Elon Alas M.D.   On: 02/14/2018 04:58   Ct Head Wo Contrast  Result Date: 02/13/2018 CLINICAL DATA:  Speech difficulty since yesterday. No reported injury. EXAM: CT HEAD WITHOUT CONTRAST TECHNIQUE: Contiguous axial images were obtained from the base of the skull through the vertex without intravenous contrast. COMPARISON:  None. FINDINGS: Brain: No evidence of parenchymal hemorrhage or extra-axial fluid collection. No mass lesion, mass effect, or midline shift. No CT evidence of acute infarction. Generalized cerebral volume loss. Nonspecific mild subcortical and periventricular white matter hypodensity, most in keeping with chronic small vessel ischemic change. No ventriculomegaly. Vascular: No acute abnormality. Skull: No evidence of calvarial fracture. Sinuses/Orbits: No fluid levels. Minimal partial opacification of the right ethmoidal air cells and inferior maxillary sinuses. Other:  The mastoid air cells are unopacified. IMPRESSION: 1.  No evidence of acute intracranial abnormality. 2. Generalized cerebral volume loss and mild chronic small vessel ischemic changes in the cerebral white matter. Electronically Signed   By: Ilona Sorrel M.D.   On: 02/13/2018 12:28   Ct Angio Neck W Or Wo Contrast  Result Date:  02/14/2018 CLINICAL DATA:  TIA symptoms. History of hypertension, hyperlipidemia, lung cancer, LEFT carotid endarterectomy. EXAM: CT ANGIOGRAPHY HEAD AND NECK TECHNIQUE: Multidetector CT imaging of the head and neck was performed using the standard protocol during bolus administration of intravenous contrast. Multiplanar CT image reconstructions and MIPs were obtained to evaluate the vascular anatomy. Carotid stenosis measurements (when applicable) are obtained utilizing NASCET criteria, using the distal internal carotid diameter as the denominator. CONTRAST:  73mL ISOVUE-370 IOPAMIDOL (ISOVUE-370) INJECTION 76% COMPARISON:  CT HEAD February 05, 2018.  CT chest February 19, 2005. FINDINGS: CT HEAD FINDINGS BRAIN: No intraparenchymal hemorrhage, mass effect nor midline shift. The ventricles and sulci are normal for age. Patchy supratentorial white matter hypodensities less than expected for patient's age, though non-specific are most compatible with chronic small vessel ischemic disease. No acute large vascular territory infarcts. No abnormal extra-axial fluid collections. Basal cisterns are patent. VASCULAR: Moderate calcific atherosclerosis of the carotid siphons. SKULL: No skull fracture. No significant scalp soft tissue swelling. SINUSES/ORBITS: Scattered mucosal retention cysts without paranasal sinus air-fluid levels.The included ocular globes and orbital contents are non-suspicious. Old RIGHT medial orbital blowout fracture. Status post bilateral ocular lens implants are RIGHT scleral banding. OTHER: None. CTA NECK FINDINGS: AORTIC ARCH: Normal appearance of the thoracic arch, normal branch pattern. Moderate calcific atherosclerosis. The origins of the innominate, left Common carotid artery and subclavian artery are patent. RIGHT CAROTID SYSTEM: Common carotid artery is patent, mild atherosclerosis. Severe calcific atherosclerosis resulting in 1  cm segment critical stenosis RIGHT ICA origin, by NASCET criteria. ICA  is patent with mild calcific atherosclerosis. LEFT CAROTID SYSTEM: Common carotid artery is patent. Patulous LEFT and carotid artery bulb consistent with endarterectomy, widely patent carotid bulb and ICA. VERTEBRAL ARTERIES:Mild stenosis RIGHT vertebral artery origin. Mild calcific atherosclerosis vertebral arteries which are patent without flow-limiting stenosis. Streak artifact LEFT the for intradural segment from dental amalgam. SKELETON: No acute osseous process though bone windows have not been submitted. OTHER NECK: Soft tissues of the neck are nonacute though, not tailored for evaluation. Severe C5-6 and C6-7 spondylosis. UPPER CHEST: Spiculated consolidations RIGHT upper lobe. Centrilobular emphysema. CTA HEAD FINDINGS: ANTERIOR CIRCULATION: Patent cervical internal carotid arteries, petrous, cavernous and supra clinoid internal carotid arteries. Mild stenosis RIGHT supraclinoid ICA. Patent anterior communicating artery. Patent anterior and middle cerebral arteries. No large vessel occlusion, significant stenosis, contrast extravasation or aneurysm. POSTERIOR CIRCULATION: Patent vertebral arteries, vertebrobasilar junction and basilar artery, as well as main branch vessels. Atherosclerosis resulting in moderate tandem stenosis V4 segments. Patent posterior cerebral arteries. No large vessel occlusion, significant stenosis, contrast extravasation or aneurysm. VENOUS SINUSES: Major dural venous sinuses are patent though not tailored for evaluation on this angiographic examination. ANATOMIC VARIANTS: Hypoplastic LEFT A1 segment. DELAYED PHASE: No abnormal intracranial enhancement. MIP images reviewed. IMPRESSION: CT HEAD: 1. Negative CT HEAD with and without contrast for age. CTA NECK: 1. Critical stenosis RIGHT ICA. Status post LEFT carotid endarterectomy, widely patent. 2. Patent vertebral arteries. 3. Multifocal consolidation RIGHT upper lobe. Recommend CT chest with contrast on non emergent basis. CTA  HEAD: 1. No emergent large vessel occlusion or flow-limiting stenosis. Aortic Atherosclerosis (ICD10-I70.0). Emphysema (ICD10-J43.9). Electronically Signed   By: Elon Alas M.D.   On: 02/14/2018 04:58   Results/Tests Pending at Time of Discharge: None  Discharge Medications:  Allergies as of 02/15/2018      Reactions   Xarelto [rivaroxaban] Other (See Comments)   vertigo      Medication List    TAKE these medications   amLODipine 5 MG tablet Commonly known as:  NORVASC Take 1 tablet (5 mg total) by mouth daily.   apixaban 5 MG Tabs tablet Commonly known as:  ELIQUIS Take 1 tablet (5 mg total) by mouth 2 (two) times daily.   aspirin 81 MG EC tablet Take 1 tablet (81 mg total) by mouth daily.   atorvastatin 80 MG tablet Commonly known as:  LIPITOR TAKE ONE TABLET BY MOUTH AT BEDTIME   Cinnamon 500 MG capsule Take 500 mg by mouth daily.   fish oil-omega-3 fatty acids 1000 MG capsule Take 1 g by mouth 2 (two) times daily.   methocarbamol 500 MG tablet Commonly known as:  ROBAXIN Take 1 tablet (500 mg total) by mouth at bedtime as needed (for restless legs).   naproxen sodium 220 MG tablet Commonly known as:  ALEVE Take 220 mg by mouth daily as needed (Pain).   PRESERVISION AREDS 2 PO Take 1 tablet by mouth 2 (two) times daily. Preser Vision Vitamin  Take 2 capsule daily.   ramipril 10 MG capsule Commonly known as:  ALTACE Take 1 capsule (10 mg total) by mouth daily.   STOOL SOFTENER PO Take 1 tablet by mouth daily as needed (Constipation).   tamsulosin 0.4 MG Caps capsule Commonly known as:  FLOMAX TAKE ONE CAPSULE BY MOUTH ONCE DAILY       Discharge Instructions: Please refer to Patient Instructions section of EMR for full details.  Patient was counseled important  signs and symptoms that should prompt return to medical care, changes in medications, dietary instructions, activity restrictions, and follow up appointments.   Follow-Up  Appointments: Follow-up Information    [facility]. Schedule an appointment as soon as possible for a visit in 4 week(s).   Specialty:  Radiology Contact information: [address] [place] [phone]       Tonia Ghent, MD. Schedule an appointment as soon as possible for a visit in 1 week(s).   Specialty:  Family Medicine Contact information: [place] [address] [phone]        Elam Dutch, MD. Schedule an appointment as soon as possible for a visit in 1 week(s).   Specialties:  Vascular Surgery, Cardiology Contact information: [place] [address] [phone]           Cleophas Dunker, DO 02/17/2018, 4:41 PM PGY-1, [place]

## 2018-02-16 ENCOUNTER — Telehealth: Payer: Self-pay

## 2018-02-16 ENCOUNTER — Telehealth: Payer: Self-pay | Admitting: *Deleted

## 2018-02-16 ENCOUNTER — Other Ambulatory Visit: Payer: Self-pay | Admitting: Family Medicine

## 2018-02-16 DIAGNOSIS — I6521 Occlusion and stenosis of right carotid artery: Secondary | ICD-10-CM

## 2018-02-16 NOTE — Telephone Encounter (Signed)
Please see about 4pm Friday.  Thanks.

## 2018-02-16 NOTE — Progress Notes (Signed)
Critical stenosis RIGHT ICA. Status post LEFT carotid endarterectomy, widely patent.

## 2018-02-16 NOTE — Progress Notes (Signed)
The following note is per Dr. Scot Dock with vascular surgery.  I'll defer to him and the pending carotid duplex.  Elsie Stain ===================================================   RIGHT CAROTID STENOSIS: This patient has a posterior circulation stroke with a small pontine infarct on the left (6 mm x 11 mm).  His right carotid stenosis would not explain this finding.  He is status post left carotid endarterectomy which is widely patent.  Given that the stenosis on the right is highly calcified I think it is difficult to determine the severity of the stenosis based on his CT angiogram of the neck.  I have ordered a carotid duplex to further assess this.  He is now on aspirin.  He is also on a statin.  He is on Eliquis for atrial fibrillation.  If he has a greater than 80% right carotid stenosis that he may be considered for elective right carotid endarterectomy in the future although again I do not think this explains his current symptoms.

## 2018-02-16 NOTE — Telephone Encounter (Signed)
Already scheduled-Coltyn Hanning V Tani Virgo, RMA

## 2018-02-16 NOTE — Telephone Encounter (Signed)
Lm requesting return call to complete TCM and confirm hosp f/u appt  

## 2018-02-16 NOTE — Telephone Encounter (Signed)
Copied from [place]. Topic: Appointment Scheduling - Scheduling Inquiry for Clinic >> Feb 16, 2018  9:50 AM Hewitt Shorts wrote: Pt was discharged Tuesday 02/15/18 and needs a hospital follow up from a stroke a week later and nothing is available can he be worked in   [employer] number [phone]

## 2018-02-17 NOTE — Telephone Encounter (Signed)
Lm requesting return call to complete TCM and confirm hosp f/u appt  

## 2018-02-18 ENCOUNTER — Encounter: Payer: Self-pay | Admitting: Family Medicine

## 2018-02-18 ENCOUNTER — Ambulatory Visit (INDEPENDENT_AMBULATORY_CARE_PROVIDER_SITE_OTHER): Payer: Medicare Other | Admitting: Family Medicine

## 2018-02-18 ENCOUNTER — Telehealth: Payer: Self-pay | Admitting: Vascular Surgery

## 2018-02-18 VITALS — BP 126/78 | HR 81 | Temp 97.8°F | Ht 75.0 in | Wt 240.5 lb

## 2018-02-18 DIAGNOSIS — E1159 Type 2 diabetes mellitus with other circulatory complications: Secondary | ICD-10-CM | POA: Diagnosis not present

## 2018-02-18 DIAGNOSIS — I6302 Cerebral infarction due to thrombosis of basilar artery: Secondary | ICD-10-CM

## 2018-02-18 DIAGNOSIS — C3490 Malignant neoplasm of unspecified part of unspecified bronchus or lung: Secondary | ICD-10-CM | POA: Diagnosis not present

## 2018-02-18 DIAGNOSIS — R911 Solitary pulmonary nodule: Secondary | ICD-10-CM

## 2018-02-18 DIAGNOSIS — I10 Essential (primary) hypertension: Secondary | ICD-10-CM

## 2018-02-18 NOTE — Telephone Encounter (Signed)
Sched. Appt. Spoke with pt. 03/24/18 1pm carotid 2:15 f/u MD

## 2018-02-18 NOTE — Patient Instructions (Signed)
We will call about your CT.  Rosaria Ferries or Azalee Course will call you if you don't see one of them on the way out.  I'll update your other docs.  Don't change your meds for now.  If you have any new symptoms, then go to the ER or dial 911 immediately.  Take care.  Glad to see you.

## 2018-02-18 NOTE — Progress Notes (Signed)
Patient name: Jonathan York  Medical record number: [id number] Date of birth: Sep 15, 1932      Age: 84 y.o.    Gender: male Date of Admission: 02/13/2018                      Date of Discharge: 02/15/2018 Admitting Physician: Kinnie Feil, MD  Primary Care Provider: Tonia Ghent, MD Consultants: Neuro, Vascular  Indication for Hospitalization: History of recent Slurred speech, gait instability  Discharge Diagnoses/Problem List:  Slurred Speech and Gait Instability: Likely stroke Atrial Fibrillation DM HTN HLD Hx Lung Cancer S/P Left Upper Lobe Partial resection and chemo 2002 Hx Carotid Endarterectomy on Left 2013 Multifocal Consolidation Right Upper Lobe  Disposition: Home  Discharge Condition: Stable  Discharge Exam:  General:84 y.o.y.o. malein NAD Cardio:irregularly irregular Lungs: CTAB, no wheezing, no rhonchi, no crackles Abdomen: Soft, non-tender to palpation, positive bowel sounds Skin: warm and dry Extremities: No edema Neuro: CN II-XII grossly intact, 5/5 strength BUE/BLE, sensation inact   Brief Hospital Course:  Jonathan York is a 84 y.o. male who presented to the ED the day after experiencing a 12hr period with slurred speech and gait instability.  At the time of presentation patient was asymptomatic and neurologically intact.  It had been over 24 hours since the start of his symptoms.  Neurology was consulted in the ED and recommended admission as this was a possible stroke due to the symptom duration of 12 hours.  His CT head at that time was negative for acute bleed.  Neurology recommended further imaging.  CTA of the head showed no occlusions or stenosis.  CTA neck showed critical stenosis of the right ICA, status post left CEA widely patent.  His echo showed an EF of 60 to 65%, LA moderately to severely dilated.  MRI of the head was also ordered and showed acute to subacute 6X 11 mm pontine infarct.  Neurology recommended follow-up with vascular  surgery for the patient's right ICA stenosis.  Vascular surgery was consulted and recommended vascular ultrasound.  Neurology and vascular surgery agreed that vascular ultrasound could be completed as an outpatient, this was confirmed by phone calls to both providers.  Because the patient's stroke was pontine, it was not in the division  that could have been caused by the stenosis of the right ICA, per neurology.  The patient will need to follow-up with his vascular surgeon Dr. Oneida Alar for vascular ultrasound and possible intervention for his right ICA stenosis.  The patient's A1c was also found to be elevated at 7.9.  It is recommended that he follow-up with his PCP for starting diabetes control.  Jonathan York home dose of amlodipine was held throughout his admission to allow for permissive hypertension.  It was restarted at the time of discharge.  He will need follow-up of his blood pressure as an outpatient.  An incidental finding of a multifocal consolidation in the right upper lobe was seen on CTA of neck.  Patient denied shortness of breath, cough, hemoptysis.  He has a history of lung cancer in the left lung for which he had a left upper lobe partial resection and chemo in 2002.  He will need a follow-up CT chest as an outpatient.  At the time of discharge, patient was grossly neurologically intact and stable, had been without symptoms during hospitalization.   Issues for Follow Up:  1. Will need follow up at stroke clinic in 4 weeks. 2. Will need follow up  with Dr. Oneida Alar and vascular ultrasound. 3. Will need CT chest as for follow up of multifocal consolidation in right upper lobe. 4. Will need follow-up of blood pressure as patient was restarted on amlodipine prior to discharge.   5. Will need DM management as his A1c was 7.9.  ========================= Hospital follow-up.  Inpatient course and treatment discussed with patient in detail.  He clearly felt abnormal with speech changes.   This was prior to admission.  The episode lasted about 12 hours.  Symptoms gradually resolved.  Later on, after his symptoms are already improving he presented to the emergency room.  He was admitted.  Work-up was noted for changes in the right carotid, along with abnormal upper lobe chest imaging and a pontine infarct.  Consult notes reviewed, with vascular surgery stating that the carotid changes are not likely to have caused his pontine infarct.  He was already on statin and anticoagulation.  Aspirin was added.  Diabetes was managed as an inpatient.  He has no new neurologic symptoms.  He was discharged home.  He is here for follow-up and we discussed all of this in detail.  No new neurologic symptoms.  He has vascular surgery follow-up regarding his carotid changes on the right.  He has neurology follow-up pending.  We talked about risk factor reduction.  Aspirin has been added.  He is still anticoagulated and on a statin.  He does have history of diabetes without medical treatment.  Given his age and A1c consistently below 8 the plan was not to put him on medication so as to avoid the risk of hypoglycemia.  Discussed with patient.  Abnormal chest imaging noted.  He had partial imaging of the lungs when he had carotid imaging.  Mass noted.  History of lung cancer many years ago.  No sweats.  No hemoptysis.  Occasional white phlegm noted.  Weight is stable.  He is not short of breath.  Discussed that he could in fact have cancer.  He needs follow-up imaging.  We talked about all this.  His son was at the office visit and all questions were answered to the best my ability for the patient and the son.  He had permissive hypertension while inpatient.  Blood pressure is improved now.  Reviewed.    PMH and SH reviewed  ROS: Per HPI unless specifically indicated in ROS section   Meds, vitals, and allergies reviewed.   GEN: nad, alert and oriented HEENT: mucous membranes moist NECK: supple w/o LA CV:  rrr.  PULM: ctab, no inc wob ABD: soft, +bs EXT: no edema SKIN: no acute rash CN 2-12 wnl B, S/S/DTR wnl x4

## 2018-02-19 NOTE — Assessment & Plan Note (Signed)
Pathophysiology discussed with patient.  Continue current medications.  He has follow-up with vascular surgery pending.  They have already ordered his follow-up neck/carotid ultrasound.  He has follow-up with neurology pending.  If he has any new neurologic symptoms then I want him to go to the emergency room.  He agrees.  All questions answered to the best my ability.  Fortunately the patient appears to have no residual symptoms.

## 2018-02-19 NOTE — Assessment & Plan Note (Signed)
Blood pressure is reasonable.  Continue as is.  He agrees.

## 2018-02-19 NOTE — Assessment & Plan Note (Addendum)
History of.  Now with new abnormal chest imaging.  We will get follow-up chest CT with contrast.  Differential discussed with patient.  No emergent symptoms.  Still okay for outpatient follow-up.  All questions answered.  The patient knows that depending on the results of his follow-up CAT scan he may need PET scan, additional work-up, etc.

## 2018-02-19 NOTE — Assessment & Plan Note (Signed)
He does have history of diabetes without medical treatment.  Given his age and A1c consistently below 8 the plan was not to put him on medication so as to avoid the risk of hypoglycemia.  Discussed with patient.  He can work on diet and exercise in the meantime and we can recheck periodically.

## 2018-02-21 ENCOUNTER — Encounter: Payer: Medicare Other | Admitting: Family Medicine

## 2018-02-21 ENCOUNTER — Other Ambulatory Visit: Payer: Self-pay

## 2018-02-21 DIAGNOSIS — I6521 Occlusion and stenosis of right carotid artery: Secondary | ICD-10-CM

## 2018-02-24 ENCOUNTER — Ambulatory Visit
Admission: RE | Admit: 2018-02-24 | Discharge: 2018-02-24 | Disposition: A | Discharge status: Home / Self Care | Payer: Medicare Other | Source: Ambulatory Visit | Attending: Family Medicine | Admitting: Family Medicine

## 2018-02-24 DIAGNOSIS — K7689 Other specified diseases of liver: Secondary | ICD-10-CM | POA: Diagnosis not present

## 2018-02-24 DIAGNOSIS — K449 Diaphragmatic hernia without obstruction or gangrene: Secondary | ICD-10-CM | POA: Diagnosis not present

## 2018-02-24 DIAGNOSIS — I2510 Atherosclerotic heart disease of native coronary artery without angina pectoris: Secondary | ICD-10-CM | POA: Insufficient documentation

## 2018-02-24 DIAGNOSIS — E278 Other specified disorders of adrenal gland: Secondary | ICD-10-CM | POA: Diagnosis not present

## 2018-02-24 DIAGNOSIS — J439 Emphysema, unspecified: Secondary | ICD-10-CM | POA: Insufficient documentation

## 2018-02-24 DIAGNOSIS — R911 Solitary pulmonary nodule: Secondary | ICD-10-CM | POA: Diagnosis not present

## 2018-02-24 DIAGNOSIS — I700 Atherosclerosis of aorta: Secondary | ICD-10-CM | POA: Insufficient documentation

## 2018-02-24 MED ORDER — IOHEXOL 300 MG/ML  SOLN
75.0000 mL | Freq: Once | INTRAMUSCULAR | Status: AC | PRN
  Administered 2018-02-24: 75 mL via INTRAVENOUS

## 2018-02-27 ENCOUNTER — Other Ambulatory Visit: Payer: Self-pay | Admitting: Family Medicine

## 2018-02-27 DIAGNOSIS — R911 Solitary pulmonary nodule: Secondary | ICD-10-CM

## 2018-02-27 DIAGNOSIS — R918 Other nonspecific abnormal finding of lung field: Secondary | ICD-10-CM

## 2018-03-04 ENCOUNTER — Ambulatory Visit
Admission: RE | Admit: 2018-03-04 | Discharge: 2018-03-04 | Disposition: A | Discharge status: Home / Self Care | Payer: Medicare Other | Source: Ambulatory Visit | Attending: Family Medicine | Admitting: Family Medicine

## 2018-03-04 DIAGNOSIS — J439 Emphysema, unspecified: Secondary | ICD-10-CM | POA: Insufficient documentation

## 2018-03-04 DIAGNOSIS — R918 Other nonspecific abnormal finding of lung field: Secondary | ICD-10-CM | POA: Insufficient documentation

## 2018-03-04 DIAGNOSIS — J984 Other disorders of lung: Secondary | ICD-10-CM | POA: Diagnosis not present

## 2018-03-04 LAB — GLUCOSE, CAPILLARY: GLUCOSE-CAPILLARY: 128 mg/dL — AB (ref 70–99)

## 2018-03-04 MED ORDER — FLUDEOXYGLUCOSE F - 18 (FDG) INJECTION
12.9800 | Freq: Once | INTRAVENOUS | Status: AC | PRN
  Administered 2018-03-04: 12.98 via INTRAVENOUS

## 2018-03-11 ENCOUNTER — Ambulatory Visit (INDEPENDENT_AMBULATORY_CARE_PROVIDER_SITE_OTHER): Payer: Medicare Other | Admitting: Family Medicine

## 2018-03-11 ENCOUNTER — Encounter: Payer: Self-pay | Admitting: Family Medicine

## 2018-03-11 VITALS — BP 154/90 | HR 52 | Temp 97.8°F | Ht 75.0 in | Wt 243.8 lb

## 2018-03-11 DIAGNOSIS — C3490 Malignant neoplasm of unspecified part of unspecified bronchus or lung: Secondary | ICD-10-CM | POA: Diagnosis not present

## 2018-03-11 DIAGNOSIS — I6302 Cerebral infarction due to thrombosis of basilar artery: Secondary | ICD-10-CM

## 2018-03-11 DIAGNOSIS — E1159 Type 2 diabetes mellitus with other circulatory complications: Secondary | ICD-10-CM

## 2018-03-11 DIAGNOSIS — Z0000 Encounter for general adult medical examination without abnormal findings: Secondary | ICD-10-CM | POA: Diagnosis not present

## 2018-03-11 DIAGNOSIS — I10 Essential (primary) hypertension: Secondary | ICD-10-CM | POA: Diagnosis not present

## 2018-03-11 NOTE — Patient Instructions (Addendum)
Keep trying to limit salt.   I'll await the notes from neuro.  Tell them I am trying to limit your possible risk from low BP and low sugars.  Recheck in about 6 months, labs at the visit.  The only lab you need to have done for your next diabetic visit is an A1c.  We can do this with a fingerstick test at the office visit.  You do not need a lab visit ahead of time for this.  It does not matter if you are fasting when the lab is done.   Take care.  Glad to see you.

## 2018-03-11 NOTE — Progress Notes (Signed)
I have personally reviewed the Medicare Annual Wellness questionnaire and have noted 1. The patient's medical and social history 2. Their use of alcohol, tobacco or illicit drugs 3. Their current medications and supplements 4. The patient's functional ability including ADL's, fall risks, home safety risks and hearing or visual             impairment. 5. Diet and physical activities 6. Evidence for depression or mood disorders  The patients weight, height, BMI have been recorded in the chart and visual acuity is per eye clinic.  I have made referrals, counseling and provided education to the patient based review of the above and I have provided the pt with a written personalized care plan for preventive services.  Provider list updated- see scanned forms.  Routine anticipatory guidance given to patient.  See health maintenance. The possibility exists that previously documented standard health maintenance information may have been brought forward from a previous encounter into this note.  If needed, that same information has been updated to reflect the current situation based on today's encounter.    Flu yearly, dw pt Shingles 2011.   PNA UTD Tetanus 2017 Colon not due, d/w pt.  Prostate cancer screening and PSA options (with potential risks and benefits of testing vs not testing) were discussed along with recent recs/guidelines. He declined testing PSA at this point.  Advance directive- wife or middle son Dellis Filbert designated if patient were incapacitated.  Cognitive function addressed- see scanned forms- and if abnormal then additional documentation follows.  Whisper level hearing intact.    Prev PET and CT d/w pt, with plan for 6 month f/u.  Pt agrees.  No cough usually - only occ cough.  No wheeze, hemoptysis.    CVA.  Still anticoagulated.  No ADE on med.  MRI with acute to subacute 6 x 11 mm pontine infarct.  No new neuro sx.  He feels back to normal.  "I feel fine."  He has neuro f/u  pending.   Diabetes:  No meds.  Hypoglycemic episodes: no Hyperglycemic episodes: no Feet problems: no Blood Sugars averaging: not checked.  eye exam within last year: yes We talked about trying to limit risk of hypoglycemia.   a1c d/w pt.    Hypertension:    Using medication without problems or lightheadedness: yes Chest pain with exertion:no Edema: occ, mild, at the end of the day Short of breath: at baseline, with bending over, stable.  Still able to exercise.  D/w pt about limiting salt.   140s/80s at home.    PMH and SH reviewed  Meds, vitals, and allergies reviewed.   ROS: Per HPI.  Unless specifically indicated otherwise in HPI, the patient denies:  General: fever. Eyes: acute vision changes ENT: sore throat Cardiovascular: chest pain Respiratory: SOB GI: vomiting GU: dysuria Musculoskeletal: acute back pain Derm: acute rash Neuro: acute motor dysfunction Psych: worsening mood Endocrine: polydipsia Heme: bleeding Allergy: hayfever  GEN: nad, alert and oriented HEENT: mucous membranes moist NECK: supple w/o LA CV: rrr. PULM: ctab, no inc wob ABD: soft, +bs EXT: 1+ BLE edema SKIN: no acute rash  Diabetic foot exam: Normal inspection No skin breakdown No calluses  Normal DP pulses Normal sensation to light touch and monofilament B 1st nails thickened

## 2018-03-13 NOTE — Assessment & Plan Note (Signed)
Blood pressures been 140s over 80s at home.  I do not want to induce hypotension.  Discussed with patient.  Continue work on diet and exercise.  He agrees.

## 2018-03-13 NOTE — Assessment & Plan Note (Signed)
No change in meds at this point, given his age.  My goal is to avoid hypoglycemia.  Discussed.  He agrees.  Recheck periodically.  He agrees.  Labs discussed with patient.

## 2018-03-13 NOTE — Assessment & Plan Note (Signed)
Flu yearly, dw pt Shingles 2011.   PNA UTD Tetanus 2017 Colon not due, d/w pt.  Prostate cancer screening and PSA options (with potential risks and benefits of testing vs not testing) were discussed along with recent recs/guidelines. He declined testing PSA at this point.  Advance directive- wife or middle son Dellis Filbert designated if patient were incapacitated.  Cognitive function addressed- see scanned forms- and if abnormal then additional documentation follows.  Whisper level hearing intact.

## 2018-03-13 NOTE — Assessment & Plan Note (Signed)
Continue current medication with anticoagulation, statin, blood pressure medications.  No symptoms now.  He has follow-up with neurology pending.  Routine cautions given.  Okay for outpatient follow-up.  I appreciate the help of all involved.  He has follow-up with vascular surgery pending. >25 minutes spent in face to face time with patient, >50% spent in counselling or coordination of care.

## 2018-03-13 NOTE — Assessment & Plan Note (Signed)
History of, with recent imaging noted. Prev PET and CT d/w pt, with plan for 6 month f/u.  Pt agrees.  No cough usually - only occ cough.  No wheeze, hemoptysis.

## 2018-03-15 NOTE — Progress Notes (Signed)
[facility] [address] [place]. [place] [address] [phone]       OFFICE FOLLOW UP NOTE  [name] Date of Birth:  July 03, 1933 Medical Record Number:  [id number]   Reason for Referral:  hospital stroke follow up  CHIEF COMPLAINT:  Chief Complaint  Patient presents with  . Follow-up    TIA follow up from hospital room 9 pt alone pt seen by Dr Erlinda Hong    HPI: Jonathan York is being seen today for initial visit in the office for left pontine infarct on 02/13/2018. History obtained from patient and chart review. Reviewed all radiology images and labs personally.  [name] is a 84 y.o. male with history of HTN, HLD, DM, BPH, carotid arterial disease s/p left CEA in 2013, afib on Eliquis, and lung cancer s/p resection and chemo2002 and left carotid endarterectomy in 2013 who presented with slurred speech and gait instability . He did not receive IV t-PA due to Eliquis and late presentation.  CT head reviewed was negative for acute infarct.  MRI head reviewed and did show a left pontine infarct.  CTA head and neck showed critical stenosis of right ICA.  2D echo unremarkable.  LDL 89 and recommended continuation of Lipitor 80 mg daily.  HTN stable during admission recommended long-term BP goal normotensive range.  Patient was previously on Eliquis for atrial fibrillation management and recommended Eliquis and aspirin 81 mg daily at discharge.  As right ICA critical stenosis asymptomatic recommended close follow-up with Dr. Oneida Alar as outpatient.  A1c 7.9 and recommended close PCP follow-up with tight glycemic control.  Patient was discharged home in stable condition.  Interval history 03/16/18: Patient is being seen today for hospital follow up. Patient did have CT chest is recommended at discharge and due to found nodules recommended PET scan.  This was performed on 03/04/2018 and which showed right apical lung lesions are not hypermetabolic and most likely representing  scarring of tissues and recommend a follow-up noncontrast chest CT in 6 to 12 months. Follow-up appointment scheduled with vascular surgery Dr. Oneida Alar on 03/24/2018 and will have US carotid Doppler performed at that time.  Patient states overall he is doing well without residual deficits or side effects from his stroke.  He continued Eliquis and aspirin for approximately 2 weeks after hospital discharge as he stated he was told to only take it for short time.  Recommend restarting aspirin 81mg  at this time due to right ICA critical stenosis and patient has follow up with Dr. Oneida Alar on 03/24/18 and request he makes final decision in regards to length of aspirin therapy.  Denies side effects of bleeding or bruising.  Continues to take Lipitor without side effects myalgias.  Blood pressure today 150/78 and his patient does monitor this at home typically 140/75.  Patient does follow with primary care for diabetic management and was not started on any medication at prior appointment.  Patient is return to all previous activities without complications.  Denies new or worsening stroke/TIA symptoms.    ROS:   14 system review of systems performed and negative with exception of swelling in legs, shortness of breath, sleepiness and restless leg  PMH:  Past Medical History:  Diagnosis Date  . Abdominal bruit 05/05/2012  . Arthritis    back pain, past lumbar surgery   . BPH (benign prostatic hypertrophy)   . Carotid arterial disease (HCC)    a. s/p left-sided CEA  . Chronic atrial fibrillation (  [place])    a. CHADS2VASc => 5 (HTN, age x 2, DM, vascular disease); Eliquis  . Diabetes mellitus type II    diet controlled   . HLD (hyperlipidemia)   . HTN (hypertension)   . Lung cancer [facility])    a. s/p resection and chemo in 2002    PSH:  Past Surgical History:  Procedure Laterality Date  . ARCH AORTOGRAM  05/27/2012   Procedure: ARCH AORTOGRAM;  Surgeon: Elam Dutch, MD;  Location: [facility] CATH LAB;  Service:  Cardiovascular;;  . CARDIOVERSION  06/29/2012   Procedure: CARDIOVERSION;  Surgeon: Thayer Headings, MD;  Location: [facility] ENDOSCOPY;  Service: Cardiovascular;  Laterality: N/A;  . CAROTID ANGIOGRAM N/A 05/27/2012   Procedure: CAROTID Cyril Loosen;  Surgeon: Elam Dutch, MD;  Location: [facility] CATH LAB;  Service: Cardiovascular;  Laterality: N/A;  . CAROTID ENDARTERECTOMY Left 05/30/12  . CATARACT EXTRACTION     right  . ENDARTERECTOMY  05/30/2012   Procedure: ENDARTERECTOMY CAROTID;  Surgeon: Elam Dutch, MD;  Location: [facility];  Service: Vascular;  Laterality: Left;  . EYE SURGERY     Jonathan detached retina- 1990's, IOL implants also   . [place]  . JOINT REPLACEMENT     knee- bilateral- 2009 and 2012  . LOBECTOMY  03/15/01   [facility])  . RETINAL DETACHMENT SURGERY     right  . SPINE SURGERY  approx 2006   Ruptered Disc  . TONSILLECTOMY     1939  . TOTAL KNEE ARTHROPLASTY     x2 per Dr. Rush Farmer  . WEDGE RESECTION  03/15/01   [facility])    Social History:  Social History   Socioeconomic History  . Marital status: Married    Spouse name: Not on file  . Number of children: 3  . Years of education: Not on file  . Highest education level: Not on file  Occupational History  . Occupation: Fired then [employer]  Social Needs  . Financial resource strain: Not on file  . Food insecurity:    Worry: Not on file    Inability: Not on file  . Transportation needs:    Medical: Not on file    Non-medical: Not on file  Tobacco Use  . Smoking status: Former Smoker    Packs/day: 1.00    Years: 32.00    Pack years: 32.00    Types: Cigarettes    Last attempt to quit: 09/03/1982    Years since quitting: 35.5  . Smokeless tobacco: Never Used  Substance and Sexual Activity  . Alcohol use: No    Alcohol/week: 0.0 standard drinks    Comment:    . Drug use: No  . Sexual activity: Yes  Lifestyle  . Physical activity:    Days per week: Not  on file    Minutes per session: Not on file  . Stress: Not on file  Relationships  . Social connections:    Talks on phone: Not on file    Gets together: Not on file    Attends religious service: Not on file    Active member of club or organization: Not on file    Attends meetings of clubs or organizations: Not on file    Relationship status: Not on file  . Intimate partner violence:    Fear of current or ex partner: Not on file    Emotionally abused: Not on file    Physically abused: Not on file  Forced sexual activity: Not on file  Other Topics Concern  . Not on file  Social History Narrative   Retired Personal assistant   From [employer]   Married happily since 1964    Family History:  Family History  Problem Relation Age of Onset  . Emphysema Father   . Dementia Mother   . Diabetes Mother   . Hypertension Mother   . Hypothyroidism Sister   . Dementia Brother   . Hypertension Brother   . Diabetes Son   . Heart disease Son        Heart Disease before age 67- Open Heart 2010  . Hypertension Son   . Stroke Son        while on coumadin  . Prostate cancer Neg Hx   . Colon cancer Neg Hx     Medications:   Current Outpatient Medications on File Prior to Visit  Medication Sig Dispense Refill  . amLODipine (NORVASC) 5 MG tablet Take 1 tablet (5 mg total) by mouth daily. 90 tablet 3  . apixaban (ELIQUIS) 5 MG TABS tablet Take 1 tablet (5 mg total) by mouth 2 (two) times daily. 180 tablet 3  . atorvastatin (LIPITOR) 80 MG tablet TAKE ONE TABLET BY MOUTH AT BEDTIME 90 tablet 3  . Cinnamon 500 MG capsule Take 500 mg by mouth daily.    . fish oil-omega-3 fatty acids 1000 MG capsule Take 1 g by mouth 2 (two) times daily.     . methocarbamol (ROBAXIN) 500 MG tablet Take 1 tablet (500 mg total) by mouth at bedtime as needed (for restless legs). 50 tablet 2  . Multiple Vitamins-Minerals (PRESERVISION AREDS 2 PO) Take 1 tablet by mouth 2 (two) times daily. Preser Vision Vitamin  Take 2  capsule daily.    . ramipril (ALTACE) 10 MG capsule Take 1 capsule (10 mg total) by mouth daily. 90 capsule 3  . tamsulosin (FLOMAX) 0.4 MG CAPS capsule TAKE ONE CAPSULE BY MOUTH ONCE DAILY 90 capsule 3   No current facility-administered medications on file prior to visit.     Allergies:   Allergies  Allergen Reactions  . Xarelto [Rivaroxaban] Other (See Comments)    vertigo     Physical Exam  Vitals:   03/16/18 0916  BP: (!) 150/78  Pulse: 71  Weight: 244 lb (110.7 kg)  Height: 6\' 3"  (1.905 m)   Body mass index is 30.5 kg/m. No exam data present  General: well developed, well nourished, pleasant elderly Caucasian male, seated, in no evident distress Head: head normocephalic and atraumatic.   Neck: supple with no carotid or supraclavicular bruits Cardiovascular: Irregular rate and rhythm no murmurs Musculoskeletal: no deformity Skin:  no rash/petichiae Vascular:  Normal pulses all extremities  Neurologic Exam Mental Status: Awake and fully alert. Oriented to place and time. Recent and remote memory intact. Attention span, concentration and fund of knowledge appropriate. Mood and affect appropriate.  Cranial Nerves: Fundoscopic exam reveals sharp disc margins. Pupils equal, briskly reactive to light. Extraocular movements full without nystagmus. Visual fields full to confrontation. Hearing intact. Facial sensation intact. Face, tongue, palate moves normally and symmetrically.  Motor: Normal bulk and tone. Normal strength in all tested extremity muscles. Sensory.: intact to touch , pinprick , position and vibratory sensation.  Coordination: Rapid alternating movements normal in all extremities. Finger-to-nose and heel-to-shin performed accurately bilaterally. Gait and Station: Arises from chair without difficulty. Stance is normal. Gait demonstrates normal stride length and balance . Able to heel, toe and  tandem walk without difficulty.  Reflexes: 1+ and symmetric. Toes  downgoing.    NIHSS  0 Modified Rankin  0 HAS-BLED 3 CHA2DS2-VASc 6   Diagnostic Data (Labs, Imaging, Testing)  CT HEAD WO CONTRAST 02/13/2018 IMPRESSION: 1.  No evidence of acute intracranial abnormality. 2. Generalized cerebral volume loss and mild chronic small vessel ischemic changes in the cerebral white matter.   CT ANGIO HEAD W OR WO CONTRAST CT ANGIO NECK W OR WO CONTRAST 02/14/2018 CTA NECK: 1. Critical stenosis RIGHT ICA. Status post LEFT carotid endarterectomy, widely patent. 2. Patent vertebral arteries. 3. Multifocal consolidation RIGHT upper lobe. Recommend CT chest with contrast on non emergent basis. -Recommend repeat CT chest outpatient CTA HEAD: 1. No emergent large vessel occlusion or flow-limiting stenosis.  MR BRAIN WO CONTRAST 02/15/2018 IMPRESSION: 1. Moderately motion degraded examination. 2. Acute to subacute 6 x 11 mm pontine infarct. 3. Otherwise negative noncontrast MRI head for age.  ECHOCARDIOGRAM 02/14/2018 Study Conclusions - Left ventricle: The cavity size was normal. There was mild focal   basal hypertrophy of the septum. Systolic function was normal.   The estimated ejection fraction was in the range of 60% to 65%.   Wall motion was normal; there were no regional wall motion   abnormalities. The study was not technically sufficient to allow   evaluation of LV diastolic dysfunction due to atrial   fibrillation. - Aortic valve: Severely calcified annulus. Trileaflet; severely   thickened, severely calcified leaflets. Valve mobility was   restricted. There was trivial regurgitation. - Mitral valve: Calcified annulus. Moderate focal calcification of   the anterior leaflet. - Left atrium: The atrium was moderately to severely dilated. - Tricuspid valve: There was trivial regurgitation. - Pulmonic valve: There was trivial regurgitation.   ASSESSMENT: Jonathan York is a 84 y.o. year old male here with left pontine infarct on 02/05/2018  secondary to small vessel disease. Vascular risk factors include HTN, HLD, DM, carotid arterial disease s/p left CEA 2013, PAF on AC, and lung cancer s/p resection and chemo 2002.  Patient is being seen today for hospital follow-up and overall doing well without residual stroke deficits.    PLAN: -Continue Eliquis (apixaban) daily and recommend restart aspirin 81 mg and Lipitor for secondary stroke prevention -F/u with PCP regarding your HLD, HTN and DM management -03/24/2018 appointment with Dr. Oneida Alar with repeat carotid ultrasound -request Dr. Oneida Alar determines length of aspirin 81 mg due to right ICA stenosis -f/u with cardiology in regards to atrial fibrillation and Eliquis management -Patient states he takes naproxen daily for arthritis pain and advised that he should stop at this time and take Tylenol as needed for pain management -continue to monitor BP at home -advised to continue to stay active and maintain a healthy diet -Maintain strict control of hypertension with blood pressure goal below 130/90, diabetes with hemoglobin A1c goal below 6.5% and cholesterol with LDL cholesterol (bad cholesterol) goal below 70 mg/dL. I also advised the patient to eat a healthy diet with plenty of whole grains, cereals, fruits and vegetables, exercise regularly and maintain ideal body weight.  Follow up in 6 months as patient stable from stroke standpoint or call earlier if needed   Greater than 50% of time during this 25 minute visit was spent on counseling,explanation of diagnosis of left pontine infarct, reviewing risk factor management of HTN, HLD, DM, PAF and ICA stenosis, planning of further management, discussion with patient and family and coordination of care    Venancio Poisson,  AGNP-BC  [facility] [address] [place],  [address]  Phone [phone] Fax [phone] Note: This document was prepared with digital dictation and possible smart  phrase technology. Any transcriptional errors that result from this process are unintentional.

## 2018-03-16 ENCOUNTER — Encounter: Payer: Self-pay | Admitting: Adult Health

## 2018-03-16 ENCOUNTER — Ambulatory Visit: Payer: Medicare Other | Admitting: Adult Health

## 2018-03-16 VITALS — BP 150/78 | HR 71 | Ht 75.0 in | Wt 244.0 lb

## 2018-03-16 DIAGNOSIS — E785 Hyperlipidemia, unspecified: Secondary | ICD-10-CM | POA: Diagnosis not present

## 2018-03-16 DIAGNOSIS — I10 Essential (primary) hypertension: Secondary | ICD-10-CM | POA: Diagnosis not present

## 2018-03-16 DIAGNOSIS — E119 Type 2 diabetes mellitus without complications: Secondary | ICD-10-CM | POA: Diagnosis not present

## 2018-03-16 DIAGNOSIS — I639 Cerebral infarction, unspecified: Secondary | ICD-10-CM

## 2018-03-16 DIAGNOSIS — I6350 Cerebral infarction due to unspecified occlusion or stenosis of unspecified cerebral artery: Secondary | ICD-10-CM

## 2018-03-16 DIAGNOSIS — I6521 Occlusion and stenosis of right carotid artery: Secondary | ICD-10-CM

## 2018-03-16 MED ORDER — ASPIRIN EC 81 MG PO TBEC: 81.0000 mg | DELAYED_RELEASE_TABLET | Freq: Every day | ORAL | 0 refills | Status: DC

## 2018-03-16 NOTE — Patient Instructions (Addendum)
Restart aspirin 81 mg daily and continue Eliquis  and lipitor  for secondary stroke prevention  Continue to follow up with PCP regarding cholesterol, blood pressure and diabetes management   Continue to follow with cardiologist regarding atrial fibrillation management  Appointment with dr. Oneida Alar on 03/24/18 with carotid ultrasound - he will decide continuation of aspirin 81mg   Stop naproxen due to NSAID risk and try tylenol for pain management   Continue to stay active and maintain a healthy diet  Continue to monitor blood pressure at home  Maintain strict control of hypertension with blood pressure goal below 130/90, diabetes with hemoglobin A1c goal below 6.5% and cholesterol with LDL cholesterol (bad cholesterol) goal below 70 mg/dL. I also advised the patient to eat a healthy diet with plenty of whole grains, cereals, fruits and vegetables, exercise regularly and maintain ideal body weight.  Followup in the future with me in 6 months or call earlier if needed       Thank you for coming to see [place] at [facility]. I hope we have been able to provide you high quality care today.  You may receive a patient satisfaction survey over the next few weeks. We would appreciate your feedback and comments so that we may continue to improve ourselves and the health of our patients.

## 2018-03-18 ENCOUNTER — Telehealth: Payer: Self-pay | Admitting: Family Medicine

## 2018-03-18 NOTE — Telephone Encounter (Signed)
Called patients wife for a referral and Mr Schoon got on the line and asked me to relay this message to you. He saw [facility] recently and they want him to have a followup CT Head in 6 months and you want him to have a CT Chest in 6 months. Please call the patient back after reviewing [facility] notes and tell him who he should get his appointments made for these from.

## 2018-03-18 NOTE — Progress Notes (Signed)
I agree with the above plan 

## 2018-03-21 NOTE — Telephone Encounter (Signed)
I will put in the order for the follow-up CT chest in about 6 months.  If he needs to have other imaging done around that time then we should be able to coordinate it altogether.  If neurology wanted other imaging at the same time, they would put in that order.  If he will please help remind me in 6 months, we can make arrangements at that point.  Thanks.

## 2018-03-23 NOTE — Telephone Encounter (Signed)
Patient notified as instructed by telephone and verbalized understanding. 

## 2018-03-24 ENCOUNTER — Encounter: Payer: Self-pay | Admitting: *Deleted

## 2018-03-24 ENCOUNTER — Other Ambulatory Visit: Payer: Self-pay

## 2018-03-24 ENCOUNTER — Ambulatory Visit: Payer: Medicare Other | Admitting: Vascular Surgery

## 2018-03-24 ENCOUNTER — Encounter: Payer: Self-pay | Admitting: Vascular Surgery

## 2018-03-24 ENCOUNTER — Ambulatory Visit (HOSPITAL_COMMUNITY)
Admission: RE | Admit: 2018-03-24 | Discharge: 2018-03-24 | Disposition: A | Discharge status: Home / Self Care | Payer: Medicare Other | Source: Ambulatory Visit | Attending: Vascular Surgery | Admitting: Vascular Surgery

## 2018-03-24 VITALS — BP 190/90 | HR 61 | Temp 97.0°F | Resp 20 | Wt 245.0 lb

## 2018-03-24 DIAGNOSIS — I6521 Occlusion and stenosis of right carotid artery: Secondary | ICD-10-CM | POA: Diagnosis not present

## 2018-03-24 NOTE — Progress Notes (Signed)
Patient name: Jonathan York MRN: [id number] DOB: 06-15-1933 Sex: male   HPI: Jonathan York is a 84 y.o. male, known to me from prior left carotid endarterectomy in 2013.  The patient was recently admitted to the hospital with a new TIA.  His symptoms primarily were related to speech.  His work-up consisted of a CT Angio of the neck which showed high-grade right internal carotid artery stenosis with patent left internal carotid artery and patent vertebral arteries.  MRI of the brain showed an acute to subacute pontine infarct.  The patient is left-handed.  He also has atrial fibrillation and is on Eliquis.  His event was July 28.  He has had no events since then.  Other medical problems include hyper tension, hyperlipidemia and history of lung cancer.  Recent PET scan was negative.  He also has diet-controlled diabetes.  Past Medical History:  Diagnosis Date  . Abdominal bruit 05/05/2012  . Arthritis    back pain, past lumbar surgery   . BPH (benign prostatic hypertrophy)   . Carotid arterial disease (HCC)    a. s/p left-sided CEA  . Chronic atrial fibrillation ([place])    a. CHADS2VASc => 5 (HTN, age x 2, DM, vascular disease); Eliquis  . Diabetes mellitus type II    diet controlled   . HLD (hyperlipidemia)   . HTN (hypertension)   . Lung cancer [facility])    a. s/p resection and chemo in 2002   Past Surgical History:  Procedure Laterality Date  . ARCH AORTOGRAM  05/27/2012   Procedure: ARCH AORTOGRAM;  Surgeon: Elam Dutch, MD;  Location: [facility] CATH LAB;  Service: Cardiovascular;;  . CARDIOVERSION  06/29/2012   Procedure: CARDIOVERSION;  Surgeon: Thayer Headings, MD;  Location: [facility] ENDOSCOPY;  Service: Cardiovascular;  Laterality: N/A;  . CAROTID ANGIOGRAM N/A 05/27/2012   Procedure: CAROTID Cyril Loosen;  Surgeon: Elam Dutch, MD;  Location: [facility] CATH LAB;  Service: Cardiovascular;  Laterality: N/A;  . CAROTID ENDARTERECTOMY Left 05/30/12  . CATARACT EXTRACTION     right  . ENDARTERECTOMY   05/30/2012   Procedure: ENDARTERECTOMY CAROTID;  Surgeon: Elam Dutch, MD;  Location: [facility];  Service: Vascular;  Laterality: Left;  . EYE SURGERY     Jonathan detached retina- 1990's, IOL implants also   . [place]  . JOINT REPLACEMENT     knee- bilateral- 2009 and 2012  . LOBECTOMY  03/15/01   [facility])  . RETINAL DETACHMENT SURGERY     right  . SPINE SURGERY  approx 2006   Ruptered Disc  . TONSILLECTOMY     1939  . TOTAL KNEE ARTHROPLASTY     x2 per Dr. Rush Farmer  . WEDGE RESECTION  03/15/01   [facility])    Family History  Problem Relation Age of Onset  . Emphysema Father   . Dementia Mother   . Diabetes Mother   . Hypertension Mother   . Hypothyroidism Sister   . Dementia Brother   . Hypertension Brother   . Diabetes Son   . Heart disease Son        Heart Disease before age 66- Open Heart 2010  . Hypertension Son   . Stroke Son        while on coumadin  . Prostate cancer Neg Hx   . Colon cancer Neg Hx     SOCIAL HISTORY: Social History   Socioeconomic History  . Marital status: Married  Spouse name: Not on file  . Number of children: 3  . Years of education: Not on file  . Highest education level: Not on file  Occupational History  . Occupation: Fired then [employer]  Social Needs  . Financial resource strain: Not on file  . Food insecurity:    Worry: Not on file    Inability: Not on file  . Transportation needs:    Medical: Not on file    Non-medical: Not on file  Tobacco Use  . Smoking status: Former Smoker    Packs/day: 1.00    Years: 32.00    Pack years: 32.00    Types: Cigarettes    Last attempt to quit: 09/03/1982    Years since quitting: 35.5  . Smokeless tobacco: Never Used  Substance and Sexual Activity  . Alcohol use: No    Alcohol/week: 0.0 standard drinks    Comment:    . Drug use: No  . Sexual activity: Yes  Lifestyle  . Physical activity:    Days per week: Not on file     Minutes per session: Not on file  . Stress: Not on file  Relationships  . Social connections:    Talks on phone: Not on file    Gets together: Not on file    Attends religious service: Not on file    Active member of club or organization: Not on file    Attends meetings of clubs or organizations: Not on file    Relationship status: Not on file  . Intimate partner violence:    Fear of current or ex partner: Not on file    Emotionally abused: Not on file    Physically abused: Not on file    Forced sexual activity: Not on file  Other Topics Concern  . Not on file  Social History Narrative   Retired Personal assistant   From [employer]   Married happily since [place] Reactions  . Xarelto [Rivaroxaban] Other (See Comments)    vertigo    Current Outpatient Medications  Medication Sig Dispense Refill  . amLODipine (NORVASC) 5 MG tablet Take 1 tablet (5 mg total) by mouth daily. 90 tablet 3  . apixaban (ELIQUIS) 5 MG TABS tablet Take 1 tablet (5 mg total) by mouth 2 (two) times daily. 180 tablet 3  . aspirin EC 81 MG tablet Take 1 tablet (81 mg total) by mouth daily. 30 tablet 0  . atorvastatin (LIPITOR) 80 MG tablet TAKE ONE TABLET BY MOUTH AT BEDTIME 90 tablet 3  . Cinnamon 500 MG capsule Take 500 mg by mouth daily.    . fish oil-omega-3 fatty acids 1000 MG capsule Take 1 g by mouth 2 (two) times daily.     . methocarbamol (ROBAXIN) 500 MG tablet Take 1 tablet (500 mg total) by mouth at bedtime as needed (for restless legs). 50 tablet 2  . Multiple Vitamins-Minerals (PRESERVISION AREDS 2 PO) Take 1 tablet by mouth 2 (two) times daily. Preser Vision Vitamin  Take 2 capsule daily.    . ramipril (ALTACE) 10 MG capsule Take 1 capsule (10 mg total) by mouth daily. 90 capsule 3  . tamsulosin (FLOMAX) 0.4 MG CAPS capsule TAKE ONE CAPSULE BY MOUTH ONCE DAILY 90 capsule 3   No current facility-administered medications for this visit.     ROS:   General:  No weight loss,  Fever, chills  HEENT: No recent headaches, no nasal bleeding, no visual changes, no  sore throat  Neurologic: No dizziness, blackouts, seizures. + recent symptoms of stroke or mini- stroke. No recent episodes of slurred speech, or temporary blindness.  Cardiac: No recent episodes of chest pain/pressure, no shortness of breath at rest.  No shortness of breath with exertion.  +history of atrial fibrillation or irregular heartbeat  Vascular: No history of rest pain in feet.  No history of claudication.  No history of non-healing ulcer, No history of DVT   Pulmonary: No home oxygen, no productive cough, no hemoptysis,  No asthma or wheezing  Musculoskeletal:  [ ]  Arthritis, [ ]  Low back pain,  [ ]  Joint pain  Hematologic:No history of hypercoagulable state.  No history of easy bleeding.  No history of anemia  Gastrointestinal: No hematochezia or melena,  No gastroesophageal reflux, no trouble swallowing  Urinary: [ ]  chronic Kidney disease, [ ]  on HD - [ ]  MWF or [ ]  TTHS, [ ]  Burning with urination, [ ]  Frequent urination, [ ]  Difficulty urinating;   Skin: No rashes  Psychological: No history of anxiety,  No history of depression   Physical Examination  Vitals:   03/24/18 1426 03/24/18 1429  BP: (!) 200/90 (!) 190/90  Pulse: 61   Resp: 20   Temp: (!) 97 F (36.1 C)   TempSrc: Oral   SpO2: 98%   Weight: 245 lb (111.1 kg)     Body mass index is 30.62 kg/m.  General:  Alert and oriented, no acute distress HEENT: Normal Neck: No bruit or JVD Pulmonary: Clear to auscultation bilaterally Cardiac: Regular Rate and Rhythm without murmur Abdomen: Soft, non-tender, non-distended, no mass Skin: No rash Extremity Pulses:  2+ radial, brachial pulses bilaterally Musculoskeletal: No deformity or edema  Neurologic: Upper and lower extremity motor 5/5 and symmetric  DATA:  Patient had a carotid duplex scan in our office today which showed a 40 to 60% calcified lesion in the right  carotid bifurcation.  Left carotid was widely patent.  Vertebrals were antegrade.  ASSESSMENT: Patient with recent TIA suggestive of a right brain event with primarily speech changes.  Although the MRI scan showed a pontine infarct I am concerned that this event was secondary to the right carotid stenosis.  CT Angio shows a high-grade stenosis although the lesion is calcified.  On duplex it was 40 to 60%.  I believe the patient warrants right carotid endarterectomy for stroke prophylaxis.  Risk benefits possible complications of procedure details including but not limited to bleeding infection stroke risk cranial nerve injury risk were all discussed with the patient today.  He understands and agrees to proceed.  However, he wanted to defer this for a few weeks because he had a vacation planned.   PLAN: Right carotid endarterectomy scheduled for April 25, 2018.  Patient will continue his Eliquis for now.  We will stop this a few days prior to her post procedure and do perioperative aspirin.  Ruta Hinds, MD [facility] Office: [phone] Pager: [phone]

## 2018-03-28 ENCOUNTER — Other Ambulatory Visit: Payer: Self-pay | Admitting: *Deleted

## 2018-04-15 NOTE — Pre-Procedure Instructions (Signed)
Kelii Chittum  04/15/2018      [facility] [address] [place] [address] Phone: [phone] Fax: [phone]    Your procedure is scheduled on 04/25/2018.  Report to [facility] Admitting at [place].M.  Call this number if you have problems the morning of surgery:  [phone]   Remember:  Do not eat or drink after midnight.     Take these medicines the morning of surgery with A SIP OF WATER: NONE  7 days prior to surgery STOP taking any Aspirin(unless otherwise instructed by your surgeon), Aleve, Naproxen, Ibuprofen, Motrin, Advil, Goody's, BC's, all herbal medications, fish oil, and all vitamins  Follow your surgeon's instructions on when to stop Asprin and Eliquis.  If no instructions were given by your surgeon then you will need to call the office to get those instructions.     How to Manage Your Diabetes Before and After Surgery  Why is it important to control my blood sugar before and after surgery? . Improving blood sugar levels before and after surgery helps healing and can limit problems. . A way of improving blood sugar control is eating a healthy diet by: o  Eating less sugar and carbohydrates o  Increasing activity/exercise o  Talking with your doctor about reaching your blood sugar goals . High blood sugars (greater than 180 mg/dL) can raise your risk of infections and slow your recovery, so you will need to focus on controlling your diabetes during the weeks before surgery. . Make sure that the doctor who takes care of your diabetes knows about your planned surgery including the date and location.  How do I manage my blood sugar before surgery? . Check your blood sugar at least 4 times a day, starting 2 days before surgery, to make sure that the level is not too high or low. o Check your blood sugar the morning of your surgery when you wake up and every 2 hours until you get to the Short Stay  unit. . If your blood sugar is less than 70 mg/dL, you will need to treat for low blood sugar: o Do not take insulin. o Treat a low blood sugar (less than 70 mg/dL) with  cup of clear juice (cranberry or apple), 4 glucose tablets, OR glucose gel. o Recheck blood sugar in 15 minutes after treatment (to make sure it is greater than 70 mg/dL). If your blood sugar is not greater than 70 mg/dL on recheck, call [phone] for further instructions. . Report your blood sugar to the short stay nurse when you get to Short Stay.  . If you are admitted to the hospital after surgery: o Your blood sugar will be checked by the staff and you will probably be given insulin after surgery (instead of oral diabetes medicines) to make sure you have good blood sugar levels. o The goal for blood sugar control after surgery is 80-180 mg/dL.    Do not wear jewelry.  Do not wear lotions, powders, or colognes, or deodorant.  Men may shave face and neck.  Do not bring valuables to the hospital.  [facility] is not responsible for any belongings or valuables.  Contacts, eyeglasses, dentures or bridgework may not be worn into surgery.  Leave your suitcase in the car.  After surgery it may be brought to your room.  For patients admitted to the hospital, discharge time will be determined by your treatment team.  Patients discharged the day of surgery will not be allowed to drive home.   Name and phone number of your driver:    Special instructions:   [place]- Preparing For Surgery  Before surgery, you can play an important role. Because skin is not sterile, your skin needs to be as free of germs as possible. You can reduce the number of germs on your skin by washing with CHG (chlorahexidine gluconate) Soap before surgery.  CHG is an antiseptic cleaner which kills germs and bonds with the skin to continue killing germs even after washing.    Oral Hygiene is also important to reduce your risk of infection.   Remember - BRUSH YOUR TEETH THE MORNING OF SURGERY WITH YOUR REGULAR TOOTHPASTE  Please do not use if you have an allergy to CHG or antibacterial soaps. If your skin becomes reddened/irritated stop using the CHG.  Do not shave (including legs and underarms) for at least 48 hours prior to first CHG shower. It is OK to shave your face.  Please follow these instructions carefully.   1. Shower the NIGHT BEFORE SURGERY and the MORNING OF SURGERY with CHG.   2. If you chose to wash your hair, wash your hair first as usual with your normal shampoo.  3. After you shampoo, rinse your hair and body thoroughly to remove the shampoo.  4. Use CHG as you would any other liquid soap. You can apply CHG directly to the skin and wash gently with a scrungie or a clean washcloth.   5. Apply the CHG Soap to your body ONLY FROM THE NECK DOWN.  Do not use on open wounds or open sores. Avoid contact with your eyes, ears, mouth and genitals (private parts). Wash Face and genitals (private parts)  with your normal soap.  6. Wash thoroughly, paying special attention to the area where your surgery will be performed.  7. Thoroughly rinse your body with warm water from the neck down.  8. DO NOT shower/wash with your normal soap after using and rinsing off the CHG Soap.  9. Pat yourself dry with a CLEAN TOWEL.  10. Wear CLEAN PAJAMAS to bed the night before surgery, wear comfortable clothes the morning of surgery  11. Place CLEAN SHEETS on your bed the night of your first shower and DO NOT SLEEP WITH PETS.    Day of Surgery: Shower as stated above Do not apply any deodorants/lotions.  Please wear clean clothes to the hospital/surgery center.   Remember to brush your teeth WITH YOUR REGULAR TOOTHPASTE.    Please read over the following fact sheets that you were given.

## 2018-04-16 ENCOUNTER — Other Ambulatory Visit: Payer: Self-pay | Admitting: Family Medicine

## 2018-04-18 ENCOUNTER — Telehealth: Payer: Self-pay

## 2018-04-18 ENCOUNTER — Encounter (HOSPITAL_COMMUNITY)
Admission: RE | Admit: 2018-04-18 | Discharge: 2018-04-18 | Disposition: A | Discharge status: Home / Self Care | Payer: Medicare Other | Source: Ambulatory Visit | Attending: Vascular Surgery | Admitting: Vascular Surgery

## 2018-04-18 ENCOUNTER — Encounter (HOSPITAL_COMMUNITY): Payer: Self-pay

## 2018-04-18 DIAGNOSIS — Z01812 Encounter for preprocedural laboratory examination: Secondary | ICD-10-CM | POA: Diagnosis not present

## 2018-04-18 HISTORY — DX: Transient cerebral ischemic attack, unspecified: G45.9

## 2018-04-18 LAB — PROTIME-INR
INR: 1.1
PROTHROMBIN TIME: 14.2 s (ref 11.4–15.2)

## 2018-04-18 LAB — SURGICAL PCR SCREEN
MRSA, PCR: NEGATIVE
STAPHYLOCOCCUS AUREUS: NEGATIVE

## 2018-04-18 LAB — CBC
HEMATOCRIT: 50.7 % (ref 39.0–52.0)
Hemoglobin: 16.5 g/dL (ref 13.0–17.0)
MCH: 29.7 pg (ref 26.0–34.0)
MCHC: 32.5 g/dL (ref 30.0–36.0)
MCV: 91.4 fL (ref 78.0–100.0)
PLATELETS: 176 10*3/uL (ref 150–400)
RBC: 5.55 MIL/uL (ref 4.22–5.81)
RDW: 12.8 % (ref 11.5–15.5)
WBC: 7.6 10*3/uL (ref 4.0–10.5)

## 2018-04-18 LAB — COMPREHENSIVE METABOLIC PANEL
ALT: 23 U/L (ref 0–44)
AST: 20 U/L (ref 15–41)
Albumin: 4.1 g/dL (ref 3.5–5.0)
Alkaline Phosphatase: 49 U/L (ref 38–126)
Anion gap: 7 (ref 5–15)
BILIRUBIN TOTAL: 0.8 mg/dL (ref 0.3–1.2)
BUN: 14 mg/dL (ref 8–23)
CO2: 25 mmol/L (ref 22–32)
CREATININE: 0.81 mg/dL (ref 0.61–1.24)
Calcium: 9.1 mg/dL (ref 8.9–10.3)
Chloride: 105 mmol/L (ref 98–111)
Glucose, Bld: 135 mg/dL — ABNORMAL HIGH (ref 70–99)
POTASSIUM: 4 mmol/L (ref 3.5–5.1)
Sodium: 137 mmol/L (ref 135–145)
TOTAL PROTEIN: 6.8 g/dL (ref 6.5–8.1)

## 2018-04-18 LAB — URINALYSIS, ROUTINE W REFLEX MICROSCOPIC
Bilirubin Urine: NEGATIVE
GLUCOSE, UA: NEGATIVE mg/dL
Hgb urine dipstick: NEGATIVE
Ketones, ur: NEGATIVE mg/dL
LEUKOCYTES UA: NEGATIVE
NITRITE: NEGATIVE
Protein, ur: NEGATIVE mg/dL
SPECIFIC GRAVITY, URINE: 1.013 (ref 1.005–1.030)
pH: 7 (ref 5.0–8.0)

## 2018-04-18 LAB — GLUCOSE, CAPILLARY: GLUCOSE-CAPILLARY: 139 mg/dL — AB (ref 70–99)

## 2018-04-18 LAB — APTT: aPTT: 34 seconds (ref 24–36)

## 2018-04-18 LAB — TYPE AND SCREEN
ABO/RH(D): A NEG
Antibody Screen: NEGATIVE

## 2018-04-18 LAB — HEMOGLOBIN A1C
HEMOGLOBIN A1C: 7.7 % — AB (ref 4.8–5.6)
MEAN PLASMA GLUCOSE: 174.29 mg/dL

## 2018-04-18 NOTE — Telephone Encounter (Signed)
I sent a staff message back to George Hugh  [facility] Short Stay Center/Anesthesiology  Phone [phone] or [phone]   Please call patient.  Have him start checking his blood pressure at home.  Please set up office visit in the near future, hopefully later this week.  Bring his home blood pressure cuff readings to the visit.  Bring his blood pressure cuff to the visit.  We will go from there.  Thanks.

## 2018-04-18 NOTE — Telephone Encounter (Signed)
Copied from [place] [id number]. Topic: Inquiry >> Apr 18, 2018  3:12 PM Scherrie Gerlach wrote:  Reason for CRM: Ebony Hail is a PA with anesthesia at [facility]. Pt did pre op today for upcoming surgeryand his bp was elevated. 1(80/100 when he left) She wants Dr Damita Dunnings to know she is going to send an in depth staff message, but feel free to call her back as well at the number listed

## 2018-04-18 NOTE — Progress Notes (Addendum)
PCP - Dr. Damita Dunnings Cardiologist - Dr. Fletcher Anon  Chest x-ray - n/a EKG - 02/14/2018 Stress Test - 05/11/2012 ECHO - 02/14/2018 Cardiac Cath - patient denies  Sleep Study - patient denies  Fasting Blood Sugar - patient unsure Checks Blood Sugar 0 times a day  Blood Thinner Instructions: Eliquis - last dose to be on Friday 04/22/18 Aspirin Instructions: start full dose ASA on 04/23/2018 per Dr. Oneida Alar instructions  Patient has elevated BP at PAT  Initial BP 200's/110's, rechecked at it was 192/106.  Benay Spice, [place].  Anesthesia review: yes, Patient recently had TIA in July; elevated BP at PAT appointment  Patient denies shortness of breath, fever, cough and chest pain at PAT appointment   Patient verbalized understanding of instructions that were given to them at the PAT appointment. Patient was also instructed that they will need to review over the PAT instructions again at home before surgery.

## 2018-04-18 NOTE — Progress Notes (Addendum)
Anesthesia PAT Evaluation:   Case:  [id number] Date/Time:  04/25/18 0715   Procedure:  ENDARTERECTOMY CAROTID (Right )   Anesthesia type:  General   Pre-op diagnosis:  right carotid stenosis   Location:  [facility] OR ROOM 12 / [place] OR   Surgeon:  Elam Dutch, MD      DISCUSSION: Patient is a 84 year old male scheduled for the above procedure. According to 03/24/18 note by Dr. Oneida Alar, "Patient with recent TIA suggestive of a right brain event with primarily speech changes.  Although the MRI scan showed a pontine infarct I am concerned that this event was secondary to the right carotid stenosis.  CT Angio shows a high-grade stenosis although the lesion is calcified.  On duplex it was 40 to 60%.  I believe the patient warrants right carotid endarterectomy for stroke prophylaxis."  History includes former smoker (quit '84), chronic afib (diagnosed 06/2012, s/p DCCV 06/29/12, recurrence 07/2015), HTN, HLD, DM2, lung cancer (s/p left VATS, LU lobectomy 03/15/01, s/p chemo), carotid artery stenosis (s/p left CEA 05/30/12), acute/subacute pontine infarct 02/14/18. 02/2018 PET scan showed right adrenal adenoma and 3.8 cm infrarenal AAA.  Patient's BP was significantly elevated at PAT (see VS section). Patient without CV symptoms. He thinks it is "white coat hypertension" since his readings are better at home. Although, this could be playing a role, until recently, readings don't seem to be quite as elevated even at doctor visits. He has what is thought to be symptomatic carotid stenosis, so procedure is not elective, but Dr. Oneida Alar would like SBP < 170 for surgery. Discussed with Dr. Oneida Alar and anesthesiologists Nolon Nations, MD and Roberts Gaudy, MD. Will reach out to Dr. Damita Dunnings to see if any additional insight/recommendations--could also consider cardiology input regarding HTN, but currently patient says HTN is managed by Dr. Damita Dunnings. Message left with Juliann Pulse at Dr. Josefine Class office. I will also send him a staff message  and leave chart for follow-up.   ADDENDUM 04/19/18 1:58 PM: Dr. Damita Dunnings will have patient monitor his home BP and will see him in the office on 04/22/18 to review and determine if additional recommendations are warranted. Although BP was not nearly as high at his office, he thinks white coat/anxiety is at least a component and doesn't want to over correct. Additional recommendations following 04/22/18 office visit. Dr. Oneida Alar updated.   ADDENDUM 04/22/18 3:33 PM: Patient was seen by Dr. Damita Dunnings this morning. BP 126/70. Home BP reading have been 120-160/70-90 on his cuff. Home cuff calibrated with office cuff. No additional medication changes recommended.    VS: Pulse 75   Temp 36.6 C   Resp 20   Ht 6\' 4"  (1.93 m)   Wt 110.3 kg   SpO2 100%   BMI 29.59 kg/m  BP on arrival to PAT 200's/110's. Recheck 192/106. Manual recheck RUE 160's/100, LUE 180's/100.  He reports that his wife is a retired Marine scientist who checks his BP regularly at home with readings of ~ 140-150's/80's.  BP Readings from Last 3 Encounters:  03/24/18 (!) 190/90  03/16/18 (!) 150/78  03/11/18 (!) 154/90  BP readings at [facility] 03/24/18: 200/90, 190/90  He takes amlodipine 5 mg and tamsulosin 0.4 mg at night and ramipril 10 mg each morning. He reports compliance with his medication regimen. According to 03/11/18 office note by his PCP Dr. Damita Dunnings, because patient reported home BP readings of ~ 140's/80's, he did not want to "induce hypotension" so no medication changes made at that time--although BP 154/90  at that time. Patient denied chest pain, SOB. Says he goes to the gym 5 days/week and is in a bowling league and bowls twice a week. Denied known CAD, CHF. Sees Dr. Fletcher Anon for afib. Reports HTN is managed by Dr. Damita Dunnings.   PROVIDERS: Tonia Ghent, MD is PCP. Kathlyn Sacramento, MD is cardiologist. Last visit with Melina Copa, PA-C on 11/18/17.  Antony Contras, MD is neurologist. Last visit 03/16/18 with Venancio Poisson, NP. Echo ordered  to follow-up murmur (see below). Six month follow-up recommended. - Last oncology visit seen was with Santiago Bur, MD on 11/23/12 for follow-up stage IIIB lung cancer. PRN oncology follow-up recommended. Dr. Damita Dunnings ordered the most recent 02/2018 chest CT and PET scan and reviewed findings with CT surgeon Modesto Charon, MD. Repeat chest CT planned in 6 months (due ~ 08/2018).   LABS: Labs reviewed: Acceptable for surgery.  A1c 7.7.  He does not check home CBGs.  (all labs ordered are listed, but only abnormal results are displayed)  Labs Reviewed  GLUCOSE, CAPILLARY - Abnormal; Notable for the following components:      Result Value   Glucose-Capillary 139 (*)    All other components within normal limits  COMPREHENSIVE METABOLIC PANEL - Abnormal; Notable for the following components:   Glucose, Bld 135 (*)    All other components within normal limits  HEMOGLOBIN A1C - Abnormal; Notable for the following components:   Hgb A1c MFr Bld 7.7 (*)    All other components within normal limits  SURGICAL PCR SCREEN  APTT  CBC  PROTIME-INR  URINALYSIS, ROUTINE W REFLEX MICROSCOPIC  TYPE AND SCREEN     IMAGES: PET scan 03/04/18: IMPRESSION: 1. The adjacent right apical lung lesions are not hypermetabolic and most likely represent scarring changes. Recommend follow-up noncontrast chest CT in 6-12 months. 2. No enlarged or hypermetabolic mediastinal or hilar lymph nodes. 3. Advanced emphysematous changes and left basilar scarring. 4. Right adrenal gland adenoma. 5. Incidental CT findings: Advanced aortic calcifications. 3.8 x 2.9 cm infrarenal abdominal aortic aneurysm.  CT chest 02/24/18: IMPRESSION: 1. Two adjacent indeterminate opacities are seen within the right lung apex with dominant opacity measuring 3.8 cm in diameter, unchanged compared to recent neck CT. Correlation with prior outside examinations (if available) is recommended. Otherwise, further evaluation with PET-CT  could be performed as indicated. 2. Moderately advanced emphysema (ICD10-J43.9). 3. Borderline enlarged mediastinal and right hilar lymph nodes - non-specific and while potentially reactive given background of emphysema, malignancy is not excluded on the basis this examination. Again, this could be further evaluated at the time PET-CT as indicated. 4. Approximately 1.7 cm right adrenal gland nodule - indeterminate on this examination, and again could be further evaluated at the time of PET CT as indicated. 5. Presumably chronic short-segment narrowing of the central aspect the left lower lobe bronchus, likely postsurgical in etiology. 6. Mild nodularity hepatic contour as could be seen in the setting of early cirrhotic change. Correlation with LFTs is advised. 7. Small hiatal hernia. 8. Coronary artery calcifications. Aortic Atherosclerosis (ICD10-I70.0).  CTA head/neck 02/14/18: IMPRESSION: CT HEAD: 1. Negative CT HEAD with and without contrast for age. CTA NECK: 1. Critical stenosis RIGHT ICA. Status post LEFT carotid endarterectomy, widely patent. 2. Patent vertebral arteries. 3. Multifocal consolidation RIGHT upper lobe. Recommend CT chest with contrast on non emergent basis. CTA HEAD: 1. No emergent large vessel occlusion or flow-limiting stenosis. Aortic Atherosclerosis (ICD10-I70.0). Emphysema (ICD10-J43.9).   EKG: 02/14/18: Afib at 51 bpm.  LAD. Septal infarct (age undetermined). Overall EKG appears stable when compared to last tracing done at [facility] 11/18/17.    CV: Echo 02/14/18: Study Conclusions - Left ventricle: The cavity size was normal. There was mild focal   basal hypertrophy of the septum. Systolic function was normal.   The estimated ejection fraction was in the range of 60% to 65%.   Wall motion was normal; there were no regional wall motion   abnormalities. The study was not technically sufficient to allow   evaluation of LV diastolic dysfunction  due to atrial   fibrillation. - Aortic valve: Severely calcified annulus. Trileaflet; severely   thickened, severely calcified leaflets. Valve mobility was   restricted. There was no stenosis. There was trivial regurgitation. - Mitral valve: Calcified annulus. Moderate focal calcification of   the anterior leaflet. - Left atrium: The atrium was moderately to severely dilated. - Tricuspid valve: There was trivial regurgitation. - Pulmonic valve: There was trivial regurgitation. (According to 11/25/17 echo, aortic valve measurements include: VTI ratio of LVOT to aortic valve: 0.47. Valve area (VTI): 1.64 cm^2. Indexed valve area (VTI): 0.67 cm^2/m^2. Mean velocity ratio of LVOT to aortic valve: 0.44. Valve area (Vmean): 1.53 cm^2. Indexed valve area (Vmean): 0.63 cm^2/m^2. Mean gradient (S): 8 mm Hg.)  Nuclear stress test 05/11/12: Overall Impression:  Normal stress nuclear study. LV Ejection Fraction: 62%.  LV Wall Motion:  NL LV Function; NL Wall Motion  He denied cardiac cath.   Carotid U/S 03/24/18: Final Interpretation: Right Carotid: Velocities in the right ICA are consistent with a 40-59% stenosis. Left Carotid: Patent left carotid endarterectomy site with no evidence forrestenosis. (Per 02/14/18 CTA neck: Critical stenosis RIGHT ICA. Status post LEFT carotid endarterectomy, widely patent.)   Past Medical History:  Diagnosis Date  . Abdominal bruit 05/05/2012  . Arthritis    back pain, past lumbar surgery   . BPH (benign prostatic hypertrophy)   . Carotid arterial disease (HCC)    a. s/p left-sided CEA  . Chronic atrial fibrillation ([place])    a. CHADS2VASc => 5 (HTN, age x 2, DM, vascular disease); Eliquis  . Diabetes mellitus type II    diet controlled   . HLD (hyperlipidemia)   . HTN (hypertension)   . Lung cancer ([place])    a. s/p resection and chemo in 2002  . TIA (transient ischemic attack) 2019    Past Surgical History:  Procedure Laterality Date  . ARCH  AORTOGRAM  05/27/2012   Procedure: ARCH AORTOGRAM;  Surgeon: Elam Dutch, MD;  Location: [facility] CATH LAB;  Service: Cardiovascular;;  . CARDIOVERSION  06/29/2012   Procedure: CARDIOVERSION;  Surgeon: Thayer Headings, MD;  Location: [facility] ENDOSCOPY;  Service: Cardiovascular;  Laterality: N/A;  . CAROTID ANGIOGRAM N/A 05/27/2012   Procedure: CAROTID Cyril Loosen;  Surgeon: Elam Dutch, MD;  Location: [facility] CATH LAB;  Service: Cardiovascular;  Laterality: N/A;  . CAROTID ENDARTERECTOMY Left 05/30/12  . CATARACT EXTRACTION     right  . ENDARTERECTOMY  05/30/2012   Procedure: ENDARTERECTOMY CAROTID;  Surgeon: Elam Dutch, MD;  Location: [place];  Service: Vascular;  Laterality: Left;  . EYE SURGERY     R detached retina- 1990's, IOL implants also   . [place]  . JOINT REPLACEMENT     knee- bilateral- 2009 and 2012  . LOBECTOMY  03/15/01   [facility])  . NOSE SURGERY    . RETINAL DETACHMENT SURGERY     right  . SPINE  SURGERY  approx 2006   Ruptered Disc  . TONSILLECTOMY     1939  . TOTAL KNEE ARTHROPLASTY     x2 per Dr. Rush Farmer  . WEDGE RESECTION  03/15/01   L[facility] Hockey)    MEDICATIONS: . amLODipine (NORVASC) 5 MG tablet  . apixaban (ELIQUIS) 5 MG TABS tablet  . aspirin EC 81 MG tablet  . atorvastatin (LIPITOR) 80 MG tablet  . Cinnamon 500 MG capsule  . fish oil-omega-3 fatty acids 1000 MG capsule  . MAGNESIUM PO  . methocarbamol (ROBAXIN) 500 MG tablet  . Multiple Vitamins-Minerals (PRESERVISION AREDS 2 PO)  . naproxen sodium (ALEVE) 220 MG tablet  . ramipril (ALTACE) 10 MG capsule  . senna-docusate (SENOKOT-S) 8.6-50 MG tablet  . tamsulosin (FLOMAX) 0.4 MG CAPS capsule   No current facility-administered medications for this encounter.     George Hugh Cook [facility]/Anesthesiology Phone [phone] 04/18/2018 3:25 PM

## 2018-04-19 NOTE — Telephone Encounter (Signed)
Patient advised. Appointment scheduled.  

## 2018-04-22 ENCOUNTER — Ambulatory Visit (INDEPENDENT_AMBULATORY_CARE_PROVIDER_SITE_OTHER): Payer: Medicare Other | Admitting: Family Medicine

## 2018-04-22 ENCOUNTER — Encounter: Payer: Self-pay | Admitting: Family Medicine

## 2018-04-22 DIAGNOSIS — I10 Essential (primary) hypertension: Secondary | ICD-10-CM

## 2018-04-22 NOTE — Progress Notes (Signed)
I did a reading with patient's cuff which was very close to the reading with our cuff and it appeared to be calibrated.  Mike Craze, CMA

## 2018-04-22 NOTE — Patient Instructions (Signed)
Don't change your meds.  Show them this checkout form about your BP at preop if needed.   Take care.  Glad to see you.

## 2018-04-22 NOTE — Progress Notes (Signed)
BP elevation at the preop visit.  BP improved here today, on our cuff and his cuff.  BP at home has been 120-160/70-90 on his cuff.    He likely has some white coat issues with BP.    No CP.  Not SOB.  Some BLE edema at baseline, most days, more at the end of the day.    "I feel fine."  He had been going to the gym until recently.  He is still bowling, ~172 average.  He rolled a 204 last night.    Meds, vitals, and allergies reviewed.   ROS: Per HPI unless specifically indicated in ROS section   GEN: nad, alert and oriented HEENT: mucous membranes moist NECK: supple w/o LA CV: rrr PULM: ctab, no inc wob ABD: soft, +bs EXT: trace BLE edema.   SKIN: no rash

## 2018-04-24 NOTE — Assessment & Plan Note (Signed)
Likely with a whitecoat component.  He may need a perioperative beta-blocker to lower his blood pressure but I do not want to induce hypotension.  I did not change his medication at this point.  I updated Dr. Oneida Alar.  I would continue as is.  Discussed with patient.  He agrees.  I appreciate the help of all involved.

## 2018-04-25 ENCOUNTER — Inpatient Hospital Stay (HOSPITAL_COMMUNITY): Payer: Medicare Other | Admitting: Anesthesiology

## 2018-04-25 ENCOUNTER — Inpatient Hospital Stay (HOSPITAL_COMMUNITY): Payer: Medicare Other | Admitting: Vascular Surgery

## 2018-04-25 ENCOUNTER — Inpatient Hospital Stay (HOSPITAL_COMMUNITY)
Admission: RE | Admit: 2018-04-25 | Discharge: 2018-04-26 | DRG: 038 | Disposition: A | Discharge status: Home / Self Care | Payer: Medicare Other | Attending: Vascular Surgery | Admitting: Vascular Surgery

## 2018-04-25 ENCOUNTER — Encounter (HOSPITAL_COMMUNITY)
Admission: RE | Disposition: A | Discharge status: Home / Self Care | Payer: Self-pay | Source: Home / Self Care | Attending: Vascular Surgery

## 2018-04-25 ENCOUNTER — Encounter (HOSPITAL_COMMUNITY): Payer: Self-pay | Admitting: Anesthesiology

## 2018-04-25 ENCOUNTER — Other Ambulatory Visit: Payer: Self-pay

## 2018-04-25 DIAGNOSIS — I6521 Occlusion and stenosis of right carotid artery: Secondary | ICD-10-CM

## 2018-04-25 DIAGNOSIS — I10 Essential (primary) hypertension: Secondary | ICD-10-CM | POA: Diagnosis not present

## 2018-04-25 DIAGNOSIS — I482 Chronic atrial fibrillation, unspecified: Secondary | ICD-10-CM | POA: Diagnosis not present

## 2018-04-25 DIAGNOSIS — Z833 Family history of diabetes mellitus: Secondary | ICD-10-CM | POA: Diagnosis not present

## 2018-04-25 DIAGNOSIS — Z7982 Long term (current) use of aspirin: Secondary | ICD-10-CM

## 2018-04-25 DIAGNOSIS — E785 Hyperlipidemia, unspecified: Secondary | ICD-10-CM | POA: Diagnosis not present

## 2018-04-25 DIAGNOSIS — Z888 Allergy status to other drugs, medicaments and biological substances status: Secondary | ICD-10-CM | POA: Diagnosis not present

## 2018-04-25 DIAGNOSIS — Z8249 Family history of ischemic heart disease and other diseases of the circulatory system: Secondary | ICD-10-CM | POA: Diagnosis not present

## 2018-04-25 DIAGNOSIS — E119 Type 2 diabetes mellitus without complications: Secondary | ICD-10-CM | POA: Diagnosis not present

## 2018-04-25 DIAGNOSIS — Z79899 Other long term (current) drug therapy: Secondary | ICD-10-CM

## 2018-04-25 DIAGNOSIS — Z9221 Personal history of antineoplastic chemotherapy: Secondary | ICD-10-CM | POA: Diagnosis not present

## 2018-04-25 DIAGNOSIS — Z87891 Personal history of nicotine dependence: Secondary | ICD-10-CM | POA: Diagnosis not present

## 2018-04-25 DIAGNOSIS — Z7901 Long term (current) use of anticoagulants: Secondary | ICD-10-CM | POA: Diagnosis not present

## 2018-04-25 DIAGNOSIS — I6529 Occlusion and stenosis of unspecified carotid artery: Secondary | ICD-10-CM | POA: Diagnosis present

## 2018-04-25 DIAGNOSIS — N400 Enlarged prostate without lower urinary tract symptoms: Secondary | ICD-10-CM | POA: Diagnosis present

## 2018-04-25 DIAGNOSIS — Z85118 Personal history of other malignant neoplasm of bronchus and lung: Secondary | ICD-10-CM | POA: Diagnosis not present

## 2018-04-25 DIAGNOSIS — Z823 Family history of stroke: Secondary | ICD-10-CM | POA: Diagnosis not present

## 2018-04-25 DIAGNOSIS — E11319 Type 2 diabetes mellitus with unspecified diabetic retinopathy without macular edema: Secondary | ICD-10-CM | POA: Diagnosis not present

## 2018-04-25 DIAGNOSIS — Z8673 Personal history of transient ischemic attack (TIA), and cerebral infarction without residual deficits: Secondary | ICD-10-CM

## 2018-04-25 HISTORY — PX: ENDARTERECTOMY: SHX5162

## 2018-04-25 HISTORY — PX: PATCH ANGIOPLASTY: SHX6230

## 2018-04-25 LAB — GLUCOSE, CAPILLARY
GLUCOSE-CAPILLARY: 142 mg/dL — AB (ref 70–99)
Glucose-Capillary: 142 mg/dL — ABNORMAL HIGH (ref 70–99)

## 2018-04-25 LAB — PROTIME-INR
INR: 1.08
Prothrombin Time: 13.9 s (ref 11.4–15.2)

## 2018-04-25 SURGERY — ENDARTERECTOMY, CAROTID
Anesthesia: General | Site: Neck | Laterality: Right

## 2018-04-25 MED ORDER — ENOXAPARIN SODIUM 30 MG/0.3ML ~~LOC~~ SOLN: 30.0000 mg | SUBCUTANEOUS | Status: DC

## 2018-04-25 MED ORDER — ASPIRIN EC 325 MG PO TBEC
325.0000 mg | DELAYED_RELEASE_TABLET | Freq: Every day | ORAL | Status: DC
  Administered 2018-04-25: 325 mg via ORAL
  Filled 2018-04-25 (×2): qty 1

## 2018-04-25 MED ORDER — SODIUM CHLORIDE 0.9 % IV SOLN
INTRAVENOUS | Status: DC
  Administered 2018-04-25 – 2018-04-26 (×2): via INTRAVENOUS

## 2018-04-25 MED ORDER — PROPOFOL 10 MG/ML IV BOLUS
INTRAVENOUS | Status: DC | PRN
  Administered 2018-04-25: 20 mg via INTRAVENOUS
  Administered 2018-04-25: 150 mg via INTRAVENOUS

## 2018-04-25 MED ORDER — PANTOPRAZOLE SODIUM 40 MG PO TBEC
40.0000 mg | DELAYED_RELEASE_TABLET | Freq: Every day | ORAL | Status: DC
  Administered 2018-04-25 – 2018-04-26 (×2): 40 mg via ORAL
  Filled 2018-04-25 (×2): qty 1

## 2018-04-25 MED ORDER — ACETAMINOPHEN 325 MG RE SUPP: 325.0000 mg | RECTAL | Status: DC | PRN

## 2018-04-25 MED ORDER — 0.9 % SODIUM CHLORIDE (POUR BTL) OPTIME
TOPICAL | Status: DC | PRN
  Administered 2018-04-25: 2000 mL

## 2018-04-25 MED ORDER — ROCURONIUM BROMIDE 50 MG/5ML IV SOSY
PREFILLED_SYRINGE | INTRAVENOUS | Status: DC | PRN
  Administered 2018-04-25: 50 mg via INTRAVENOUS

## 2018-04-25 MED ORDER — GUAIFENESIN-DM 100-10 MG/5ML PO SYRP: 15.0000 mL | ORAL_SOLUTION | ORAL | Status: DC | PRN

## 2018-04-25 MED ORDER — HEPARIN SODIUM (PORCINE) 1000 UNIT/ML IJ SOLN
INTRAMUSCULAR | Status: DC | PRN
  Administered 2018-04-25: 10000 [IU] via INTRAVENOUS

## 2018-04-25 MED ORDER — ALUM & MAG HYDROXIDE-SIMETH 200-200-20 MG/5ML PO SUSP: 15.0000 mL | ORAL | Status: DC | PRN

## 2018-04-25 MED ORDER — CEFAZOLIN SODIUM-DEXTROSE 2-4 GM/100ML-% IV SOLN
2.0000 g | INTRAVENOUS | Status: AC
  Administered 2018-04-25: 2 g via INTRAVENOUS
  Filled 2018-04-25: qty 100

## 2018-04-25 MED ORDER — TAMSULOSIN HCL 0.4 MG PO CAPS
0.4000 mg | ORAL_CAPSULE | Freq: Every day | ORAL | Status: DC
  Administered 2018-04-25 – 2018-04-26 (×2): 0.4 mg via ORAL
  Filled 2018-04-25 (×2): qty 1

## 2018-04-25 MED ORDER — AMLODIPINE BESYLATE 5 MG PO TABS
5.0000 mg | ORAL_TABLET | Freq: Every day | ORAL | Status: DC
  Administered 2018-04-25 – 2018-04-26 (×2): 5 mg via ORAL
  Filled 2018-04-25 (×2): qty 1

## 2018-04-25 MED ORDER — CEFAZOLIN SODIUM-DEXTROSE 2-4 GM/100ML-% IV SOLN
2.0000 g | Freq: Three times a day (TID) | INTRAVENOUS | Status: AC
  Administered 2018-04-25 (×2): 2 g via INTRAVENOUS
  Filled 2018-04-25 (×2): qty 100

## 2018-04-25 MED ORDER — ACETAMINOPHEN 325 MG PO TABS: 325.0000 mg | ORAL_TABLET | ORAL | Status: DC | PRN

## 2018-04-25 MED ORDER — ATORVASTATIN CALCIUM 80 MG PO TABS
80.0000 mg | ORAL_TABLET | Freq: Every day | ORAL | Status: DC
  Administered 2018-04-25: 80 mg via ORAL
  Filled 2018-04-25: qty 1

## 2018-04-25 MED ORDER — GLYCOPYRROLATE PF 0.2 MG/ML IJ SOSY
PREFILLED_SYRINGE | INTRAMUSCULAR | Status: AC
  Filled 2018-04-25: qty 1

## 2018-04-25 MED ORDER — CHLORHEXIDINE GLUCONATE 4 % EX LIQD: 60.0000 mL | Freq: Once | CUTANEOUS | Status: DC

## 2018-04-25 MED ORDER — SENNOSIDES-DOCUSATE SODIUM 8.6-50 MG PO TABS: 1.0000 | ORAL_TABLET | ORAL | Status: DC

## 2018-04-25 MED ORDER — ONDANSETRON HCL 4 MG/2ML IJ SOLN
4.0000 mg | Freq: Four times a day (QID) | INTRAMUSCULAR | Status: DC | PRN
  Administered 2018-04-25: 4 mg via INTRAVENOUS
  Filled 2018-04-25: qty 2

## 2018-04-25 MED ORDER — SODIUM CHLORIDE 0.9 % IV SOLN
INTRAVENOUS | Status: DC | PRN
  Administered 2018-04-25: 50 ug/min via INTRAVENOUS

## 2018-04-25 MED ORDER — PHENOL 1.4 % MT LIQD: 1.0000 | OROMUCOSAL | Status: DC | PRN

## 2018-04-25 MED ORDER — LIDOCAINE HCL (PF) 1 % IJ SOLN
INTRAMUSCULAR | Status: AC
  Filled 2018-04-25: qty 5

## 2018-04-25 MED ORDER — DEXAMETHASONE SODIUM PHOSPHATE 10 MG/ML IJ SOLN
INTRAMUSCULAR | Status: DC | PRN
  Administered 2018-04-25: 10 mg via INTRAVENOUS

## 2018-04-25 MED ORDER — SODIUM CHLORIDE 0.9 % IV SOLN
INTRAVENOUS | Status: DC | PRN
  Administered 2018-04-25: 07:00:00

## 2018-04-25 MED ORDER — DOCUSATE SODIUM 100 MG PO CAPS
100.0000 mg | ORAL_CAPSULE | Freq: Every day | ORAL | Status: DC
  Administered 2018-04-26: 100 mg via ORAL
  Filled 2018-04-25: qty 1

## 2018-04-25 MED ORDER — METOPROLOL TARTRATE 5 MG/5ML IV SOLN: 2.0000 mg | INTRAVENOUS | Status: DC | PRN

## 2018-04-25 MED ORDER — GLYCOPYRROLATE PF 0.2 MG/ML IJ SOSY
PREFILLED_SYRINGE | INTRAMUSCULAR | Status: DC | PRN
  Administered 2018-04-25 (×2): .1 mg via INTRAVENOUS

## 2018-04-25 MED ORDER — LABETALOL HCL 5 MG/ML IV SOLN
10.0000 mg | INTRAVENOUS | Status: DC | PRN
  Administered 2018-04-25: 10 mg via INTRAVENOUS
  Filled 2018-04-25: qty 4

## 2018-04-25 MED ORDER — SODIUM CHLORIDE 0.9 % IV SOLN
INTRAVENOUS | Status: AC
  Filled 2018-04-25: qty 1.2

## 2018-04-25 MED ORDER — SUGAMMADEX SODIUM 200 MG/2ML IV SOLN
INTRAVENOUS | Status: DC | PRN
  Administered 2018-04-25: 250 mg via INTRAVENOUS

## 2018-04-25 MED ORDER — RAMIPRIL 10 MG PO CAPS
10.0000 mg | ORAL_CAPSULE | Freq: Every day | ORAL | Status: DC
  Administered 2018-04-25 – 2018-04-26 (×2): 10 mg via ORAL
  Filled 2018-04-25 (×3): qty 1

## 2018-04-25 MED ORDER — HYDRALAZINE HCL 20 MG/ML IJ SOLN: 5.0000 mg | INTRAMUSCULAR | Status: DC | PRN

## 2018-04-25 MED ORDER — MAGNESIUM SULFATE 2 GM/50ML IV SOLN: 2.0000 g | Freq: Every day | INTRAVENOUS | Status: DC | PRN

## 2018-04-25 MED ORDER — FENTANYL CITRATE (PF) 100 MCG/2ML IJ SOLN: 25.0000 ug | INTRAMUSCULAR | Status: DC | PRN

## 2018-04-25 MED ORDER — METHOCARBAMOL 500 MG PO TABS: 500.0000 mg | ORAL_TABLET | Freq: Every evening | ORAL | Status: DC | PRN

## 2018-04-25 MED ORDER — FENTANYL CITRATE (PF) 250 MCG/5ML IJ SOLN
INTRAMUSCULAR | Status: AC
  Filled 2018-04-25: qty 5

## 2018-04-25 MED ORDER — SODIUM CHLORIDE 0.9 % IV SOLN: INTRAVENOUS | Status: DC

## 2018-04-25 MED ORDER — HYDROMORPHONE HCL 1 MG/ML IJ SOLN: 0.5000 mg | INTRAMUSCULAR | Status: DC | PRN

## 2018-04-25 MED ORDER — FENTANYL CITRATE (PF) 100 MCG/2ML IJ SOLN
INTRAMUSCULAR | Status: DC | PRN
  Administered 2018-04-25 (×2): 50 ug via INTRAVENOUS
  Administered 2018-04-25: 100 ug via INTRAVENOUS

## 2018-04-25 MED ORDER — BISACODYL 5 MG PO TBEC: 5.0000 mg | DELAYED_RELEASE_TABLET | Freq: Every day | ORAL | Status: DC | PRN

## 2018-04-25 MED ORDER — SODIUM CHLORIDE 0.9 % IV SOLN: 500.0000 mL | Freq: Once | INTRAVENOUS | Status: DC | PRN

## 2018-04-25 MED ORDER — PROTAMINE SULFATE 10 MG/ML IV SOLN
INTRAVENOUS | Status: DC | PRN
  Administered 2018-04-25: 20 mg via INTRAVENOUS
  Administered 2018-04-25 (×3): 10 mg via INTRAVENOUS

## 2018-04-25 MED ORDER — PROPOFOL 10 MG/ML IV BOLUS
INTRAVENOUS | Status: AC
  Filled 2018-04-25: qty 20

## 2018-04-25 MED ORDER — LACTATED RINGERS IV SOLN
INTRAVENOUS | Status: DC | PRN
  Administered 2018-04-25 (×2): via INTRAVENOUS

## 2018-04-25 MED ORDER — PHENYLEPHRINE HCL 10 MG/ML IJ SOLN
INTRAMUSCULAR | Status: DC | PRN
  Administered 2018-04-25: 40 ug via INTRAVENOUS

## 2018-04-25 MED ORDER — APIXABAN 5 MG PO TABS
5.0000 mg | ORAL_TABLET | Freq: Two times a day (BID) | ORAL | Status: DC
  Administered 2018-04-26: 5 mg via ORAL
  Filled 2018-04-25: qty 1

## 2018-04-25 MED ORDER — SENNOSIDES-DOCUSATE SODIUM 8.6-50 MG PO TABS: 1.0000 | ORAL_TABLET | Freq: Every evening | ORAL | Status: DC | PRN

## 2018-04-25 MED ORDER — OXYCODONE HCL 5 MG PO TABS: 5.0000 mg | ORAL_TABLET | ORAL | Status: DC | PRN

## 2018-04-25 MED ORDER — POTASSIUM CHLORIDE CRYS ER 20 MEQ PO TBCR: 20.0000 meq | EXTENDED_RELEASE_TABLET | Freq: Every day | ORAL | Status: DC | PRN

## 2018-04-25 MED ORDER — ONDANSETRON HCL 4 MG/2ML IJ SOLN: 4.0000 mg | Freq: Once | INTRAMUSCULAR | Status: DC | PRN

## 2018-04-25 MED ORDER — HEMOSTATIC AGENTS (NO CHARGE) OPTIME
TOPICAL | Status: DC | PRN
  Administered 2018-04-25: 1 via TOPICAL

## 2018-04-25 MED ORDER — LIDOCAINE 2% (20 MG/ML) 5 ML SYRINGE
INTRAMUSCULAR | Status: DC | PRN
  Administered 2018-04-25: 100 mg via INTRAVENOUS

## 2018-04-25 MED ORDER — ONDANSETRON HCL 4 MG/2ML IJ SOLN
INTRAMUSCULAR | Status: DC | PRN
  Administered 2018-04-25: 4 mg via INTRAVENOUS

## 2018-04-25 SURGICAL SUPPLY — 52 items
ADH SKN CLS APL DERMABOND .7 (GAUZE/BANDAGES/DRESSINGS) ×1
AGENT HMST SPONGE THK3/8 (HEMOSTASIS) ×1
CANISTER SUCT 3000ML PPV (MISCELLANEOUS) ×3 IMPLANT
CANNULA VESSEL 3MM 2 BLNT TIP (CANNULA) ×6 IMPLANT
CATH ROBINSON RED A/P 18FR (CATHETERS) ×3 IMPLANT
CLIP VESOCCLUDE MED 6/CT (CLIP) ×3 IMPLANT
CLIP VESOCCLUDE SM WIDE 6/CT (CLIP) ×3 IMPLANT
COVER WAND RF STERILE (DRAPES) ×1 IMPLANT
CRADLE DONUT ADULT HEAD (MISCELLANEOUS) ×3 IMPLANT
DECANTER SPIKE VIAL GLASS SM (MISCELLANEOUS) IMPLANT
DERMABOND ADVANCED (GAUZE/BANDAGES/DRESSINGS) ×2
DERMABOND ADVANCED .7 DNX12 (GAUZE/BANDAGES/DRESSINGS) ×1 IMPLANT
DRAIN HEMOVAC 1/8 X 5 (WOUND CARE) IMPLANT
ELECT CAUTERY BLADE 6.4 (BLADE) ×2 IMPLANT
ELECT REM PT RETURN 9FT ADLT (ELECTROSURGICAL) ×3
ELECTRODE REM PT RTRN 9FT ADLT (ELECTROSURGICAL) ×1 IMPLANT
EVACUATOR SILICONE 100CC (DRAIN) IMPLANT
GLOVE BIO SURGEON STRL SZ 6.5 (GLOVE) ×2 IMPLANT
GLOVE BIO SURGEON STRL SZ7 (GLOVE) ×2 IMPLANT
GLOVE BIO SURGEON STRL SZ7.5 (GLOVE) ×3 IMPLANT
GLOVE BIO SURGEONS STRL SZ 6.5 (GLOVE) ×2
GLOVE BIOGEL PI IND STRL 6.5 (GLOVE) IMPLANT
GLOVE BIOGEL PI IND STRL 7.0 (GLOVE) IMPLANT
GLOVE BIOGEL PI IND STRL 7.5 (GLOVE) IMPLANT
GLOVE BIOGEL PI INDICATOR 6.5 (GLOVE) ×4
GLOVE BIOGEL PI INDICATOR 7.0 (GLOVE) ×4
GLOVE BIOGEL PI INDICATOR 7.5 (GLOVE) ×2
GLOVE ECLIPSE 7.0 STRL STRAW (GLOVE) ×2 IMPLANT
GOWN STRL REUS W/ TWL LRG LVL3 (GOWN DISPOSABLE) ×3 IMPLANT
GOWN STRL REUS W/TWL LRG LVL3 (GOWN DISPOSABLE) ×15
HEMOSTAT SPONGE AVITENE ULTRA (HEMOSTASIS) ×2 IMPLANT
KIT BASIN OR (CUSTOM PROCEDURE TRAY) ×3 IMPLANT
KIT TURNOVER KIT B (KITS) ×3 IMPLANT
NDL HYPO 25GX1X1/2 BEV (NEEDLE) IMPLANT
NEEDLE HYPO 25GX1X1/2 BEV (NEEDLE) IMPLANT
NS IRRIG 1000ML POUR BTL (IV SOLUTION) ×6 IMPLANT
PACK CAROTID (CUSTOM PROCEDURE TRAY) ×3 IMPLANT
PAD ARMBOARD 7.5X6 YLW CONV (MISCELLANEOUS) ×6 IMPLANT
PATCH HEMASHIELD 8X75 (Vascular Products) ×2 IMPLANT
PENCIL BUTTON HOLSTER BLD 10FT (ELECTRODE) ×2 IMPLANT
SHUNT CAROTID BYPASS 10 (VASCULAR PRODUCTS) ×2 IMPLANT
SUT ETHILON 3 0 PS 1 (SUTURE) IMPLANT
SUT PROLENE 6 0 CC (SUTURE) ×5 IMPLANT
SUT PROLENE 7 0 BV 1 (SUTURE) ×2 IMPLANT
SUT SILK 3 0 TIES 17X18 (SUTURE)
SUT SILK 3-0 18XBRD TIE BLK (SUTURE) IMPLANT
SUT VIC AB 3-0 SH 27 (SUTURE) ×3
SUT VIC AB 3-0 SH 27X BRD (SUTURE) ×1 IMPLANT
SUT VICRYL 4-0 PS2 18IN ABS (SUTURE) ×3 IMPLANT
SYR CONTROL 10ML LL (SYRINGE) IMPLANT
TOWEL GREEN STERILE (TOWEL DISPOSABLE) ×3 IMPLANT
WATER STERILE IRR 1000ML POUR (IV SOLUTION) ×3 IMPLANT

## 2018-04-25 NOTE — Anesthesia Postprocedure Evaluation (Signed)
Anesthesia Post Note  Patient: R Stoy Fenn  Procedure(s) Performed: ENDARTERECTOMY RIGHT CAROTID (Right Neck) PATCH ANGIOPLASTY RIGHT CAROTID ARTERY USING HEMASHILED PLATINUM FINESSE PATCH (Right Neck)     Patient location during evaluation: PACU Anesthesia Type: General Level of consciousness: awake and alert, oriented and awake Pain management: pain level controlled Vital Signs Assessment: post-procedure vital signs reviewed and stable Respiratory status: spontaneous breathing, nonlabored ventilation and respiratory function stable Cardiovascular status: blood pressure returned to baseline and stable Postop Assessment: no apparent nausea or vomiting Anesthetic complications: no    Last Vitals:  Vitals:   04/25/18 1439 04/25/18 1515  BP: 135/81   Pulse: (!) 56   Resp: 20   Temp: 36.4 C 36.4 C  SpO2: 91%     Last Pain:  Vitals:   04/25/18 1525  TempSrc:   PainSc: 0-No pain                 Catalina Gravel

## 2018-04-25 NOTE — H&P (Signed)
Patient name: Jonathan York   MRN: [id number]        DOB: [birth year]        Sex: male   HPI: Jonathan York is a 84 y.o. male, known to me from prior left carotid endarterectomy in 2013.  The patient was recently admitted to the hospital with a new TIA.  His symptoms primarily were related to speech.  His work-up consisted of a CT Angio of the neck which showed high-grade right internal carotid artery stenosis with patent left internal carotid artery and patent vertebral arteries.  MRI of the brain showed an acute to subacute pontine infarct.  The patient is left-handed.  He also has atrial fibrillation and is on Eliquis.  His event was July 28.  He has had no events since then.  Other medical problems include hyper tension, hyperlipidemia and history of lung cancer.  Recent PET scan was negative.  He also has diet-controlled diabetes.      Past Medical History:  Diagnosis Date  . Abdominal bruit 05/05/2012  . Arthritis    back pain, past lumbar surgery   . BPH (benign prostatic hypertrophy)   . Carotid arterial disease (HCC)    a. s/p left-sided CEA  . Chronic atrial fibrillation ([place])    a. CHADS2VASc => 5 (HTN, age x 2, DM, vascular disease); Eliquis  . Diabetes mellitus type II    diet controlled   . HLD (hyperlipidemia)   . HTN (hypertension)   . Lung cancer [facility])    a. s/p resection and chemo in 2002   Past Surgical History:  Procedure Laterality Date  . ARCH AORTOGRAM  05/27/2012   Procedure: ARCH AORTOGRAM;  Surgeon: Elam Dutch, MD;  Location: [facility] CATH LAB;  Service: Cardiovascular;;  . CARDIOVERSION  06/29/2012   Procedure: CARDIOVERSION;  Surgeon: Thayer Headings, MD;  Location: [facility] ENDOSCOPY;  Service: Cardiovascular;  Laterality: N/A;  . CAROTID ANGIOGRAM N/A 05/27/2012   Procedure: CAROTID Cyril Loosen;  Surgeon: Elam Dutch, MD;  Location: [facility] CATH LAB;  Service: Cardiovascular;  Laterality: N/A;  . CAROTID ENDARTERECTOMY Left 05/30/12  . CATARACT  EXTRACTION     right  . ENDARTERECTOMY  05/30/2012   Procedure: ENDARTERECTOMY CAROTID;  Surgeon: Elam Dutch, MD;  Location: [place];  Service: Vascular;  Laterality: Left;  . EYE SURGERY     Jonathan detached retina- 1990's, IOL implants also   . [place]  . JOINT REPLACEMENT     knee- bilateral- 2009 and 2012  . LOBECTOMY  03/15/01   [facility] R[facility] Hockey)  . RETINAL DETACHMENT SURGERY     right  . SPINE SURGERY  approx 2006   Ruptered Disc  . TONSILLECTOMY     1939  . TOTAL KNEE ARTHROPLASTY     x2 per Dr. Rush Farmer  . WEDGE RESECTION  03/15/01   LUL Roxan Hockey)         Family History  Problem Relation Age of Onset  . Emphysema Father   . Dementia Mother   . Diabetes Mother   . Hypertension Mother   . Hypothyroidism Sister   . Dementia Brother   . Hypertension Brother   . Diabetes Son   . Heart disease Son        Heart Disease before age 64- Open Heart 2010  . Hypertension Son   . Stroke Son        while on coumadin  . Prostate cancer Neg Hx   . Colon  cancer Neg Hx     SOCIAL HISTORY: Social History        Socioeconomic History  . Marital status: Married    Spouse name: Not on file  . Number of children: 3  . Years of education: Not on file  . Highest education level: Not on file  Occupational History  . Occupation: Fired then [employer]  Social Needs  . Financial resource strain: Not on file  . Food insecurity:    Worry: Not on file    Inability: Not on file  . Transportation needs:    Medical: Not on file    Non-medical: Not on file  Tobacco Use  . Smoking status: Former Smoker    Packs/day: 1.00    Years: 32.00    Pack years: 32.00    Types: Cigarettes    Last attempt to quit: 09/03/1982    Years since quitting: 35.5  . Smokeless tobacco: Never Used  Substance and Sexual Activity  . Alcohol use: No    Alcohol/week: 0.0 standard drinks     Comment:    . Drug use: No  . Sexual activity: Yes  Lifestyle  . Physical activity:    Days per week: Not on file    Minutes per session: Not on file  . Stress: Not on file  Relationships  . Social connections:    Talks on phone: Not on file    Gets together: Not on file    Attends religious service: Not on file    Active member of club or organization: Not on file    Attends meetings of clubs or organizations: Not on file    Relationship status: Not on file  . Intimate partner violence:    Fear of current or ex partner: Not on file    Emotionally abused: Not on file    Physically abused: Not on file    Forced sexual activity: Not on file  Other Topics Concern  . Not on file  Social History Narrative   Retired Personal assistant   From [employer]   Married happily since [place] Reactions  . Xarelto [Rivaroxaban] Other (See Comments)    vertigo          Current Outpatient Medications  Medication Sig Dispense Refill  . amLODipine (NORVASC) 5 MG tablet Take 1 tablet (5 mg total) by mouth daily. 90 tablet 3  . apixaban (ELIQUIS) 5 MG TABS tablet Take 1 tablet (5 mg total) by mouth 2 (two) times daily. 180 tablet 3  . aspirin EC 81 MG tablet Take 1 tablet (81 mg total) by mouth daily. 30 tablet 0  . atorvastatin (LIPITOR) 80 MG tablet TAKE ONE TABLET BY MOUTH AT BEDTIME 90 tablet 3  . Cinnamon 500 MG capsule Take 500 mg by mouth daily.    . fish oil-omega-3 fatty acids 1000 MG capsule Take 1 g by mouth 2 (two) times daily.     . methocarbamol (ROBAXIN) 500 MG tablet Take 1 tablet (500 mg total) by mouth at bedtime as needed (for restless legs). 50 tablet 2  . Multiple Vitamins-Minerals (PRESERVISION AREDS 2 PO) Take 1 tablet by mouth 2 (two) times daily. Preser Vision Vitamin  Take 2 capsule daily.    . ramipril (ALTACE) 10 MG capsule Take 1 capsule (10 mg total) by mouth daily. 90 capsule 3  . tamsulosin (FLOMAX) 0.4  MG CAPS capsule TAKE ONE CAPSULE BY MOUTH ONCE  DAILY 90 capsule 3   No current facility-administered medications for this visit.     ROS:   General:  No weight loss, Fever, chills  HEENT: No recent headaches, no nasal bleeding, no visual changes, no sore throat  Neurologic: No dizziness, blackouts, seizures. + recent symptoms of stroke or mini- stroke. No recent episodes of slurred speech, or temporary blindness.  Cardiac: No recent episodes of chest pain/pressure, no shortness of breath at rest.  No shortness of breath with exertion.  +history of atrial fibrillation or irregular heartbeat  Vascular: No history of rest pain in feet.  No history of claudication.  No history of non-healing ulcer, No history of DVT   Pulmonary: No home oxygen, no productive cough, no hemoptysis,  No asthma or wheezing  Musculoskeletal:  [ ]  Arthritis, [ ]  Low back pain,  [ ]  Joint pain  Hematologic:No history of hypercoagulable state.  No history of easy bleeding.  No history of anemia  Gastrointestinal: No hematochezia or melena,  No gastroesophageal reflux, no trouble swallowing  Urinary: [ ]  chronic Kidney disease, [ ]  on HD - [ ]  MWF or [ ]  TTHS, [ ]  Burning with urination, [ ]  Frequent urination, [ ]  Difficulty urinating;   Skin: No rashes  Psychological: No history of anxiety,  No history of depression   Physical Examination  Vitals:   04/25/18 0606 04/25/18 0640  BP: (!) 204/114   Pulse: 68   Resp: 20   SpO2: 98%   Weight:  110.1 kg  Height:  6\' 4"  (1.93 m)    General:  Alert and oriented, no acute distress HEENT: Normal Neck: No bruit or JVD Pulmonary: Clear to auscultation bilaterally Cardiac: Regular Rate and Rhythm without murmur Abdomen: Soft, non-tender, non-distended, no mass Skin: No rash Extremity Pulses:  2+ radial, brachial pulses bilaterally Musculoskeletal: No deformity or edema      Neurologic: Upper and lower extremity motor 5/5 and  symmetric  DATA:  Patient had a carotid duplex scan in our office today which showed a 40 to 60% calcified lesion in the right carotid bifurcation.  Left carotid was widely patent.  Vertebrals were antegrade.  ASSESSMENT: Patient with recent TIA suggestive of a right brain event with primarily speech changes.  Although the MRI scan showed a pontine infarct I am concerned that this event was secondary to the right carotid stenosis.  CT Angio shows a high-grade stenosis although the lesion is calcified.  On duplex it was 40 to 60%.  I believe the patient warrants right carotid endarterectomy for stroke prophylaxis.  Risk benefits possible complications of procedure details including but not limited to bleeding infection stroke risk cranial nerve injury risk were all discussed with the patient today.  He understands and agrees to proceed.  However, he wanted to defer this for a few weeks because he had a vacation planned.   PLAN: Right carotid endarterectomy scheduled for April 25, 2018.  Patient will continue his Eliquis for now.  We will stop this a few days prior to her post procedure and do perioperative aspirin.  Ruta Hinds, MD [facility] Office: [phone] Pager: [phone]

## 2018-04-25 NOTE — Anesthesia Procedure Notes (Signed)
Arterial Line Insertion Start/End10/01/2018 7:05 AM, 04/25/2018 7:15 AM Performed by: Neldon Newport, CRNA, CRNA  Preanesthetic checklist: patient identified, IV checked, site marked, risks and benefits discussed and surgical consent Right, radial was placed Catheter size: 20 G Hand hygiene performed  and maximum sterile barriers used   Attempts: 2 Procedure performed without using ultrasound guided technique. Following insertion, dressing applied. Post procedure assessment: unchanged and normal  Patient tolerated the procedure well with no immediate complications.

## 2018-04-25 NOTE — Anesthesia Preprocedure Evaluation (Addendum)
Anesthesia Evaluation  Patient identified by MRN, date of birth, ID band Patient awake    Reviewed: Allergy & Precautions, NPO status , Patient's Chart, lab work & pertinent test results  Airway Mallampati: II  TM Distance: <3 FB Neck ROM: Full    Dental  (+) Teeth Intact, Dental Advisory Given, Missing   Pulmonary COPD, former smoker,  Lung cancer s/p resection, chemo     Pulmonary exam normal breath sounds clear to auscultation       Cardiovascular hypertension, Pt. on medications (-) angina+ Peripheral Vascular Disease (s/p Left CEA)  (-) CAD and (-) Past MI Normal cardiovascular exam+ dysrhythmias (2nd degree AV block) Atrial Fibrillation  Rhythm:Regular Rate:Normal  Echo 02/14/18: Study Conclusions  - Left ventricle: The cavity size was normal. There was mild focal basal hypertrophy of the septum. Systolic function was normal. The estimated ejection fraction was in the range of 60% to 65%. Wall motion was normal; there were no regional wall motion abnormalities. The study was not technically sufficient to allow evaluation of LV diastolic dysfunction due to atrial fibrillation. - Aortic valve: Severely calcified annulus. Trileaflet; severely thickened, severely calcified leaflets. Valve mobility was restricted. There was trivial regurgitation. - Mitral valve: Calcified annulus. Moderate focal calcification of the anterior leaflet. - Left atrium: The atrium was moderately to severely dilated. - Tricuspid valve: There was trivial regurgitation. - Pulmonic valve: There was trivial regurgitation.   Neuro/Psych TIA   GI/Hepatic negative GI ROS, Neg liver ROS, GERD  ,  Endo/Other  diabetes, Well Controlled, Type 2  Renal/GU negative Renal ROS     Musculoskeletal  (+) Arthritis ,   Abdominal   Peds  Hematology  (+) Blood dyscrasia (Eliquis), ,   Anesthesia Other Findings Day of surgery medications reviewed with the  patient.  Reproductive/Obstetrics                            Anesthesia Physical Anesthesia Plan  ASA: III  Anesthesia Plan: General   Post-op Pain Management:    Induction: Intravenous  PONV Risk Score and Plan: 2 and Ondansetron and Dexamethasone  Airway Management Planned: Oral ETT  Additional Equipment: Arterial line  Intra-op Plan:   Post-operative Plan: Extubation in OR  Informed Consent: I have reviewed the patients History and Physical, chart, labs and discussed the procedure including the risks, benefits and alternatives for the proposed anesthesia with the patient or authorized representative who has indicated his/her understanding and acceptance.   Dental advisory given and Dental Advisory Given  Plan Discussed with: CRNA and Anesthesiologist  Anesthesia Plan Comments:        Anesthesia Quick Evaluation

## 2018-04-25 NOTE — Progress Notes (Signed)
Inpatient Diabetes Program Recommendations  AACE/ADA: New Consensus Statement on Inpatient Glycemic Control (2019)  Target Ranges:  Prepandial:   less than 140 mg/dL      Peak postprandial:   less than 180 mg/dL (1-2 hours)      Critically ill patients:  140 - 180 mg/dL   Results for MENA, LIENAU (MRN [id number]) as of 04/25/2018 15:21  Ref. Range 04/25/2018 06:11 04/25/2018 10:41  Glucose-Capillary Latest Ref Range: 70 - 99 mg/dL 142 (H) 142 (H)    Review of Glycemic Control  Diabetes history: DM2 Outpatient Diabetes medications: None Current orders for Inpatient glycemic control: None  Inpatient Diabetes Program Recommendations:  Insulin-Correction: Please consider ordering CBGs with Novolog correction scale ACHS (if diet ordered) or Q4H (if NPO).  Thanks, Barnie Alderman, RN, MSN, CDE Diabetes Coordinator Inpatient Diabetes Program [phone] (Team Pager from 8am to 5pm)

## 2018-04-25 NOTE — Progress Notes (Signed)
[facility]  Subjective  - awake and alert    Objective (!) 141/91 (!) 58 98.1 F (36.7 C) 14 97%  Intake/Output Summary (Last 24 hours) at 04/25/2018 1117 Last data filed at 04/25/2018 1017 Gross per 24 hour  Intake 1317 ml  Output 250 ml  Net 1067 ml   Neuro follows commands UE/LE 5/5 symmetric no facial asymmetry tongue midline No neck hematoma  Assessment/Planning: Stable post op R CEA To 4E when bed available  Ruta Hinds 04/25/2018 11:17 AM --  Laboratory Lab Results: No results for input(s): WBC, HGB, HCT, PLT in the last 72 hours. BMET No results for input(s): NA, K, CL, CO2, GLUCOSE, BUN, CREATININE, CALCIUM in the last 72 hours.  COAG Lab Results  Component Value Date   INR 1.08 04/25/2018   INR 1.10 04/18/2018   INR 1.13 02/13/2018   No results found for: PTT

## 2018-04-25 NOTE — Progress Notes (Signed)
Patient received from PACU, A&Ox4, VSS. Telemetry applied and ccmd notified.

## 2018-04-25 NOTE — Anesthesia Procedure Notes (Signed)
Procedure Name: Intubation Date/Time: 04/25/2018 7:41 AM Performed by: Neldon Newport, CRNA Pre-anesthesia Checklist: Timeout performed, Patient being monitored, Suction available, Emergency Drugs available and Patient identified Patient Re-evaluated:Patient Re-evaluated prior to induction Oxygen Delivery Method: Circle system utilized Preoxygenation: Pre-oxygenation with 100% oxygen Induction Type: IV induction Ventilation: Mask ventilation without difficulty and Oral airway inserted - appropriate to patient size Laryngoscope Size: Mac and 4 Grade View: Grade II Tube type: Oral Tube size: 7.5 mm Number of attempts: 1 Placement Confirmation: breath sounds checked- equal and bilateral,  positive ETCO2 and ETT inserted through vocal cords under direct vision Secured at: 24 cm Tube secured with: Tape Dental Injury: Teeth and Oropharynx as per pre-operative assessment

## 2018-04-25 NOTE — Op Note (Signed)
Procedure: Right carotid endarterectomy  Preoperative diagnosis: asymptomatic right internal carotid artery stenosis  Postoperative diagnosis: Same  Anesthesia General  Asst.: Gerri Lins, [facility]  Operative findings: #1 greater than 80% right internal carotid stenosis                                                             #2 Dacron patch           #3 10 Fr shunt  Operative details: After obtaining informed consent, the patient was taken to the operating room. The patient was placed in a supine position on the operating room table. After induction of general anesthesia, the patient's entire neck and chest was prepped and draped in the usual sterile fashion. An oblique incision was made on the right aspect of the patient's neck anterior to the border the right sternocleidomastoid muscle. The incision was carried into the subcutaneous tissues and through the platysma. The sternocleidomastoid muscle was identified and reflected laterally. The common carotid artery was then found at the base of the incision this was dissected free circumferentially. It was fairly soft on palpation.  The vagus nerve was identified and protected. Dissection was then carried up to the level carotid bifurcation.   The bifurcation was slightly rotated making the internal carotid artery in a more posterior location which required a little more dissection to get past the level of the stenosis.  This required me to mobilize the inferior portion of the hypoglossal nerve to make more room for clamping on the internal carotid artery.  The hyperglossal nerve was above the primary area of dissection.  Several tethering branches were ligated and divided between silk ties in order to mobilize the nerve.  The internal carotid artery was dissected free circumferentially just below the level of the hypoglossal nerve and it was soft in character at this location and above any palpable disease. A vessel loop was placed around this. Next  the external carotid and superior thyroid arteries were dissected free circumferentially and vessel loops were placed around these. The patient was given 10000 units of intravenous heparin.  After 2 minutes of circulation time and raising the mean arterial pressure to 90 mm mercury, the distal internal carotid artery was controlled with small bulldog clamp. The external carotid and superior thyroid arteries were controlled with vessel loops. The common carotid artery was controlled with a peripheral DeBakey clamp. A longitudinal opening was made in the common carotid artery just below the bifurcation. The arteriotomy was extended distally up into the internal carotid with Potts scissors. There was a large calcified plaque with greater than 80% stenosis in the internal carotid.  A 10 Fr shunt was brought onto the field and fashioned to fit the patient's artery.  This was threaded into the distal internal carotid artery and allowed to backbleed thoroughly.  There was pulsatile backbleeding.  This was then threaded into the common carotid and secured with a Rummel tourniquet.   There was no air at this point and flow was restored to the brain.  Attention was then turned to the common carotid artery once again. A suitable endarterectomy plane was obtained and endarterectomy was begun in the common carotid artery and a good proximal endpoint was obtained. An eversion endarterectomy was performed on the external carotid artery and  a good endpoint was obtained. The plaque was then elevated in the internal carotid artery.  The plaque was passed off the table.  There was some lifting at the distal endpoint so this was tacked with several 7-0 Prolene sutures.  All loose debris was then removed from the carotid bed and everything was thoroughly irrigated with heparinized saline. A Dacron patch was then brought on to the operative field and this was sewn on as a patch angioplasty using a running 6-0 Prolene suture. Prior to  completion of the anastomosis the internal carotid artery was thoroughly backbled. This was then controlled again with a fine bulldog clamp.  The common carotid was thoroughly flushed forward. The external carotid was also thoroughly backbled.  The remainder of the patch was completed and the anastomosis was secured. Flow was then restored first retrograde from the external carotid into the carotid bed then antegrade from the common carotid to the external carotid artery and after approximately 5 cardiac cycles to the internal carotid artery. Doppler was used to evaluate the external/internal and common carotid arteries and these all had good Doppler flow. Hemostasis was obtained with 2 additional repair sutures. The patient was also given 50 mg of Protamine.  Avitene was also used to achieve hemostasis.    The platysma muscle was reapproximated using a running 3-0 Vicryl suture. The skin was closed with 4 0 Vicryl subcuticular stitch.  The patient was awakened in the operating room and was moving upper and lower extremities symmetrically and following commands.  The patient was stable on arrival to the PACU.  Ruta Hinds, MD [facility] Office: [phone] Pager: [phone]

## 2018-04-25 NOTE — Transfer of Care (Signed)
Immediate Anesthesia Transfer of Care Note  Patient: Jonathan York  Procedure(s) Performed: ENDARTERECTOMY RIGHT CAROTID (Right Neck) PATCH ANGIOPLASTY RIGHT CAROTID ARTERY USING HEMASHILED PLATINUM FINESSE PATCH (Right Neck)  Patient Location: PACU  Anesthesia Type:General  Level of Consciousness: awake, alert  and oriented  Airway & Oxygen Therapy: Patient Spontanous Breathing and Patient connected to nasal cannula oxygen  Post-op Assessment: Report given to RN, Post -op Vital signs reviewed and stable and Patient moving all extremities X 4  Post vital signs: Reviewed and stable  Last Vitals:  Vitals Value Taken Time  BP 180/81 04/25/2018 10:38 AM  Temp    Pulse 62 04/25/2018 10:39 AM  Resp 17 04/25/2018 10:39 AM  SpO2 96 % 04/25/2018 10:39 AM  Vitals shown include unvalidated device data.  Last Pain:  Vitals:   04/25/18 0640  PainSc: 0-No pain         Complications: No apparent anesthesia complications

## 2018-04-26 ENCOUNTER — Encounter (HOSPITAL_COMMUNITY): Payer: Self-pay | Admitting: Vascular Surgery

## 2018-04-26 LAB — CBC
HEMATOCRIT: 41 % (ref 39.0–52.0)
HEMOGLOBIN: 13.5 g/dL (ref 13.0–17.0)
MCH: 29.3 pg (ref 26.0–34.0)
MCHC: 32.9 g/dL (ref 30.0–36.0)
MCV: 88.9 fL (ref 80.0–100.0)
Platelets: 168 10*3/uL (ref 150–400)
RBC: 4.61 MIL/uL (ref 4.22–5.81)
RDW: 12.8 % (ref 11.5–15.5)
WBC: 12.8 10*3/uL — AB (ref 4.0–10.5)

## 2018-04-26 LAB — BASIC METABOLIC PANEL
ANION GAP: 10 (ref 5–15)
BUN: 17 mg/dL (ref 8–23)
CHLORIDE: 104 mmol/L (ref 98–111)
CO2: 22 mmol/L (ref 22–32)
Calcium: 8.4 mg/dL — ABNORMAL LOW (ref 8.9–10.3)
Creatinine, Ser: 1.03 mg/dL (ref 0.61–1.24)
GFR calc non Af Amer: >60 mL/min (ref >60)
Glucose, Bld: 169 mg/dL — ABNORMAL HIGH (ref 70–99)
POTASSIUM: 4.2 mmol/L (ref 3.5–5.1)
Sodium: 136 mmol/L (ref 135–145)

## 2018-04-26 MED ORDER — OXYCODONE HCL 5 MG PO TABS: 5.0000 mg | ORAL_TABLET | Freq: Four times a day (QID) | ORAL | 0 refills | Status: DC | PRN

## 2018-04-26 NOTE — Care Management Note (Signed)
Case Management Note Marvetta Gibbons RN, BSN Transitions of Care Unit 4E- RN Case Manager [phone]  Patient Details  Name: Jonathan York MRN: [id number] Date of Birth: 1933-03-26  Subjective/Objective:    Pt admitted s/p CEA                Action/Plan: PTA pt lived at home with spouse, plan to return home, no CM needs noted for transition home.   Expected Discharge Date:  04/26/18               Expected Discharge Plan:  Home/Self Care  In-House Referral:  NA  Discharge planning Services  CM Consult  Post Acute Care Choice:  NA Choice offered to:  NA  DME Arranged:    DME Agency:     HH Arranged:    HH Agency:     Status of Service:  Completed, signed off  If discussed at [place] of Stay Meetings, dates discussed:    Discharge Disposition: home/self care   Additional Comments:  Dawayne Patricia, RN 04/26/2018, 10:36 AM

## 2018-04-26 NOTE — Discharge Instructions (Signed)
   [facility]  Discharge Instructions   Carotid Endarterectomy (CEA)  Please refer to the following instructions for your post-procedure care. Your surgeon or physician assistant will discuss any changes with you.  Activity  You are encouraged to walk as much as you can. You can slowly return to normal activities but must avoid strenuous activity and heavy lifting until your doctor tell you it's OK. Avoid activities such as vacuuming or swinging a golf club. You can drive after one week if you are comfortable and you are no longer taking prescription pain medications. It is normal to feel tired for serval weeks after your surgery. It is also normal to have difficulty with sleep habits, eating, and bowel movements after surgery. These will go away with time.  Bathing/Showering  You may shower after you come home. Do not soak in a bathtub, hot tub, or swim until the incision heals completely.  Incision Care  Shower every day. Clean your incision with mild soap and water. Pat the area dry with a clean towel. You do not need a bandage unless otherwise instructed. Do not apply any ointments or creams to your incision. You may have skin glue on your incision. Do not peel it off. It will come off on its own in about one week. Your incision may feel thickened and raised for several weeks after your surgery. This is normal and the skin will soften over time. For Men Only: It's OK to shave around the incision but do not shave the incision itself for 2 weeks. It is common to have numbness under your chin that could last for several months.  Diet  Resume your normal diet. There are no special food restrictions following this procedure. A low fat/low cholesterol diet is recommended for all patients with vascular disease. In order to heal from your surgery, it is CRITICAL to get adequate nutrition. Your body requires vitamins, minerals, and protein. Vegetables are the best  source of vitamins and minerals. Vegetables also provide the perfect balance of protein. Processed food has little nutritional value, so try to avoid this.        Medications  Resume taking all of your medications unless your doctor or physician assistant tells you not to. If your incision is causing pain, you may take over-the- counter pain relievers such as acetaminophen (Tylenol). If you were prescribed a stronger pain medication, please be aware these medications can cause nausea and constipation. Prevent nausea by taking the medication with a snack or meal. Avoid constipation by drinking plenty of fluids and eating foods with a high amount of fiber, such as fruits, vegetables, and grains. Do not take Tylenol if you are taking prescription pain medications.  Follow Up  Our office will schedule a follow up appointment 2-3 weeks following discharge.  Please call us immediately for any of the following conditions  Increased pain, redness, drainage (pus) from your incision site. Fever of 101 degrees or higher. If you should develop stroke (slurred speech, difficulty swallowing, weakness on one side of your body, loss of vision) you should call 911 and go to the nearest emergency room.  Reduce your risk of vascular disease:  Stop smoking. If you would like help call [facility] at 1-800-QUIT-NOW (1-800-784-8669) or [facility] at 336-586-4000. Manage your cholesterol Maintain a desired weight Control your diabetes Keep your blood pressure down  If you have any questions, please call the office at [phone].   

## 2018-04-26 NOTE — Progress Notes (Signed)
Jonathan York to be D/C'd Home per MD order. Discussed with the patient and all questions fully answered.    An After Visit Summary was printed and given to the patient.   [name]  04/26/2018 7:22 AM

## 2018-04-26 NOTE — Progress Notes (Addendum)
[facility]  Subjective  - Doing well, little sore when swallowing, but taking it slow.   Objective (!) 144/81 65 98.2 F (36.8 C) (Oral) 20 96%  Intake/Output Summary (Last 24 hours) at 04/26/2018 0710 Last data filed at 04/26/2018 0341 Gross per 24 hour  Intake 2640.29 ml  Output 775 ml  Net 1865.29 ml    Grip 5/5 , sensation intact, smile symmetric, no tongue deviation. Right CEA neck incision healing well without hematoma. Heart A fib Lungs non labored breathing  Assessment/Planning: POD # right CEA  Incision healing well without neuro deficits Plan discharge home in stable condition will f/u in 2-3 weeks with Dr. Oneida Alar.   Jonathan York 04/26/2018 7:10 AM -- Agree with above  Ruta Hinds, MD [facility] [place]: [phone] Pager: [phone]  Laboratory Lab Results: Recent Labs    04/26/18 0345  WBC 12.8*  HGB 13.5  HCT 41.0  PLT 168   BMET Recent Labs    04/26/18 0345  NA 136  K 4.2  CL 104  CO2 22  GLUCOSE 169*  BUN 17  CREATININE 1.03  CALCIUM 8.4*    COAG Lab Results  Component Value Date   INR 1.08 04/25/2018   INR 1.10 04/18/2018   INR 1.13 02/13/2018   No results found for: PTT

## 2018-04-27 ENCOUNTER — Telehealth: Payer: Self-pay | Admitting: Vascular Surgery

## 2018-04-27 NOTE — Discharge Summary (Signed)
[facility] Discharge Summary   Patient ID:  Jonathan York MRN: [id number] DOB/AGE: Nov 07, 1932 84 y.o.  Admit date: 04/25/2018 Discharge date: 04/26/2018 Date of Surgery: 04/25/2018 Surgeon: Surgeon(s): Elam Dutch, MD  Admission Diagnosis: right carotid stenosis  Discharge Diagnoses:  right carotid stenosis  Secondary Diagnoses: Past Medical History:  Diagnosis Date  . Abdominal bruit 05/05/2012  . Arthritis    back pain, past lumbar surgery   . BPH (benign prostatic hypertrophy)   . Carotid arterial disease (HCC)    a. s/p left-sided CEA  . Chronic atrial fibrillation    a. CHADS2VASc => 5 (HTN, age x 2, DM, vascular disease); Eliquis  . Diabetes mellitus type II    diet controlled   . HLD (hyperlipidemia)   . HTN (hypertension)   . Lung cancer ([place])    a. s/p resection and chemo in 2002  . TIA (transient ischemic attack) 2019    Procedure(s): ENDARTERECTOMY RIGHT CAROTID PATCH ANGIOPLASTY RIGHT CAROTID ARTERY USING HEMASHILED PLATINUM FINESSE PATCH  Discharged Condition: good  HPI: 84 y/o male presented for follow up.  He had a prior left carotid endarterectomy in 2013. The patient was recently admitted to the hospital with a new TIA. His symptoms primarily were related to speech. His work-up consisted of a CT Angio of the neck which showed high-grade right internal carotid artery stenosis with patent left internal carotid artery and patent vertebral arteries.MRI of the brain showed an acute to subacute pontine infarct. The patient is left-handed. He also has atrial fibrillation and is on Eliquis. His event was July 28. He has had no events since then.   The MRI scan showed a pontine infarct I am concerned that this event was secondary to the right carotid stenosis. CT Angio shows a high-grade stenosis although the lesion is calcified. On duplex it was 40 to 60%. I believe the patient warrants right carotid endarterectomy for stroke  prophylaxis.  He was scheduled for right CEA.   Hospital Course:  Jonathan York is a 84 y.o. male is S/P  Procedure(s): ENDARTERECTOMY RIGHT CAROTID PATCH ANGIOPLASTY RIGHT CAROTID ARTERY USING HEMASHILED PLATINUM FINESSE PATCH  He had an uneventful stay in the hospital post op.  Incision healing well without neuro deficits.  He was discharged home in stable condition with a 2-3 week f/u in our office.   Significant Diagnostic Studies: CBC Lab Results  Component Value Date   WBC 12.8 (H) 04/26/2018   HGB 13.5 04/26/2018   HCT 41.0 04/26/2018   MCV 88.9 04/26/2018   PLT 168 04/26/2018    BMET    Component Value Date/Time   NA 136 04/26/2018 0345   NA 139 11/04/2015 1021   NA 140 11/22/2012 1420   K 4.2 04/26/2018 0345   K 4.5 11/22/2012 1420   CL 104 04/26/2018 0345   CL 105 11/22/2012 1420   CO2 22 04/26/2018 0345   CO2 26 11/22/2012 1420   GLUCOSE 169 (H) 04/26/2018 0345   GLUCOSE 135 (H) 11/22/2012 1420   BUN 17 04/26/2018 0345   BUN 13 11/04/2015 1021   BUN 13.9 11/22/2012 1420   CREATININE 1.03 04/26/2018 0345   CREATININE 0.9 11/22/2012 1420   CALCIUM 8.4 (L) 04/26/2018 0345   CALCIUM 8.9 11/22/2012 1420   GFRNONAA >60 04/26/2018 0345   GFRAA >60 04/26/2018 0345   COAG Lab Results  Component Value Date   INR 1.08 04/25/2018   INR 1.10 04/18/2018   INR 1.13 02/13/2018  Disposition:  Discharge to :Home Discharge Instructions    Call MD for:  redness, tenderness, or signs of infection (pain, swelling, bleeding, redness, odor or green/yellow discharge around incision site)   Complete by:  As directed    Call MD for:  severe or increased pain, loss or decreased feeling  in affected limb(s)   Complete by:  As directed    Call MD for:  temperature >100.5   Complete by:  As directed    Resume previous diet   Complete by:  As directed      Allergies as of 04/26/2018      Reactions   Xarelto [rivaroxaban] Other (See Comments)   vertigo       Medication List    TAKE these medications   amLODipine 5 MG tablet Commonly known as:  NORVASC TAKE 1 TABLET BY MOUTH ONCE DAILY   apixaban 5 MG Tabs tablet Commonly known as:  ELIQUIS Take 1 tablet (5 mg total) by mouth 2 (two) times daily.   atorvastatin 80 MG tablet Commonly known as:  LIPITOR TAKE 1 TABLET BY MOUTH AT BEDTIME   Cinnamon 500 MG capsule Take 500 mg by mouth daily.   fish oil-omega-3 fatty acids 1000 MG capsule Take 1 g by mouth 3 (three) times daily.   MAGNESIUM PO Take 1 tablet by mouth daily.   methocarbamol 500 MG tablet Commonly known as:  ROBAXIN Take 1 tablet (500 mg total) by mouth at bedtime as needed (for restless legs).   oxyCODONE 5 MG immediate release tablet Commonly known as:  Oxy IR/ROXICODONE Take 1 tablet (5 mg total) by mouth every 6 (six) hours as needed for moderate pain.   PRESERVISION AREDS 2 PO Take 1 Can by mouth 2 (two) times daily.   ramipril 10 MG capsule Commonly known as:  ALTACE Take 1 capsule (10 mg total) by mouth daily.   senna-docusate 8.6-50 MG tablet Commonly known as:  Senokot-S Take 1 tablet by mouth every other day.   tamsulosin 0.4 MG Caps capsule Commonly known as:  FLOMAX TAKE ONE CAPSULE BY MOUTH ONCE DAILY      Verbal and written Discharge instructions given to the patient. Wound care per Discharge AVS Follow-up Information    Elam Dutch, MD Follow up in 2 week(s).   Specialties:  Vascular Surgery, Cardiology Why:  office will call Contact information: [address] [place] [address] [phone]           Signed: Roxy Horseman 04/27/2018, 1:43 PM --- For VQI Registry use --- Instructions: Press F2 to tab through selections.  Delete question if not applicable.   Modified Rankin score at D/C (0-6): Rankin Score=0  IV medication needed for:  1. Hypertension: No 2. Hypotension: No  Post-op Complications: No  1. Post-op CVA or TIA: No  If yes: Event  classification (right eye, left eye, right cortical, left cortical, verterobasilar, other):   If yes: Timing of event (intra-op, <6 hrs post-op, >=6 hrs post-op, unknown):   2. CN injury: No  If yes: CN  injuried   3. Myocardial infarction: No  If yes: Dx by (EKG or clinical, Troponin):   4.  CHF: No  5.  Dysrhythmia (new): No  6. Wound infection: No  7. Reperfusion symptoms: No  8. Return to OR: No  If yes: return to OR for (bleeding, neurologic, other CEA incision, other):   Discharge medications: Statin use:  Yes ASA use:  No  for medical reason  Beta blocker use:  no ACE-Inhibitor use:  No  for medical reason   P2Y12 Antagonist use: [x ] None, [ ]  Plavix, [ ]  Plasugrel, [ ]  Ticlopinine, [ ]  Ticagrelor, [ ]  Other, [ ]  No for medical reason, [ ]  Non-compliant, [ ]  Not-indicated Anti-coagulant use:  [ ]  None, [ ]  Warfarin, [ ]  Rivaroxaban, [ ]  Dabigatran, [ ]  Other, [ ]  No for medical reason, [ ]  Non-compliant, [ ]  Not-indicated [x]  Apixaban

## 2018-04-27 NOTE — Telephone Encounter (Signed)
sch appt spk to pt 05/12/18 145pm  p/o MD

## 2018-05-12 ENCOUNTER — Ambulatory Visit (INDEPENDENT_AMBULATORY_CARE_PROVIDER_SITE_OTHER): Payer: Self-pay | Admitting: Vascular Surgery

## 2018-05-12 ENCOUNTER — Encounter: Payer: Self-pay | Admitting: Vascular Surgery

## 2018-05-12 ENCOUNTER — Ambulatory Visit (INDEPENDENT_AMBULATORY_CARE_PROVIDER_SITE_OTHER): Payer: Medicare Other

## 2018-05-12 ENCOUNTER — Other Ambulatory Visit: Payer: Self-pay

## 2018-05-12 VITALS — BP 176/81 | HR 69 | Temp 97.0°F | Resp 18 | Ht 76.0 in | Wt 242.0 lb

## 2018-05-12 DIAGNOSIS — Z23 Encounter for immunization: Secondary | ICD-10-CM | POA: Diagnosis not present

## 2018-05-12 DIAGNOSIS — I6523 Occlusion and stenosis of bilateral carotid arteries: Secondary | ICD-10-CM

## 2018-05-12 NOTE — Progress Notes (Signed)
Patient is an 84 year old male who returns for follow-up today after recent right carotid endarterectomy April 25, 2018.  He denies any symptoms of TIA amaurosis or stroke.  He has no incisional drainage.  He currently is on Eliquis and Lipitor.  He has had prior left carotid endarterectomy in the past 2013.   Past Surgical History:  Procedure Laterality Date  . ARCH AORTOGRAM  05/27/2012   Procedure: ARCH AORTOGRAM;  Surgeon: Elam Dutch, MD;  Location: [facility] CATH LAB;  Service: Cardiovascular;;  . CARDIOVERSION  06/29/2012   Procedure: CARDIOVERSION;  Surgeon: Thayer Headings, MD;  Location: [facility] ENDOSCOPY;  Service: Cardiovascular;  Laterality: N/A;  . CAROTID ANGIOGRAM N/A 05/27/2012   Procedure: CAROTID Cyril Loosen;  Surgeon: Elam Dutch, MD;  Location: [facility] CATH LAB;  Service: Cardiovascular;  Laterality: N/A;  . CAROTID ENDARTERECTOMY Left 05/30/12  . CATARACT EXTRACTION     right  . ENDARTERECTOMY  05/30/2012   Procedure: ENDARTERECTOMY CAROTID;  Surgeon: Elam Dutch, MD;  Location: [place];  Service: Vascular;  Laterality: Left;  . ENDARTERECTOMY Right 04/25/2018   Procedure: ENDARTERECTOMY RIGHT CAROTID;  Surgeon: Elam Dutch, MD;  Location: [place];  Service: Vascular;  Laterality: Right;  . EYE SURGERY     R detached retina- 1990's, IOL implants also   . [place]  . JOINT REPLACEMENT     knee- bilateral- 2009 and 2012  . LOBECTOMY  03/15/01   [facility] Roxan Hockey)  . NOSE SURGERY    . PATCH ANGIOPLASTY Right 04/25/2018   Procedure: PATCH ANGIOPLASTY RIGHT CAROTID ARTERY USING HEMASHILED PLATINUM FINESSE PATCH;  Surgeon: Elam Dutch, MD;  Location: [place];  Service: Vascular;  Laterality: Right;  . RETINAL DETACHMENT SURGERY     right  . SPINE SURGERY  approx 2006   Ruptered Disc  . TONSILLECTOMY     1939  . TOTAL KNEE ARTHROPLASTY     x2 per Dr. Rush Farmer  . WEDGE RESECTION  03/15/01   [facility] R[facility] Hockey)     Current Outpatient Medications on File  Prior to Visit  Medication Sig Dispense Refill  . amLODipine (NORVASC) 5 MG tablet TAKE 1 TABLET BY MOUTH ONCE DAILY 90 tablet 3  . apixaban (ELIQUIS) 5 MG TABS tablet Take 1 tablet (5 mg total) by mouth 2 (two) times daily. 180 tablet 3  . atorvastatin (LIPITOR) 80 MG tablet TAKE 1 TABLET BY MOUTH AT BEDTIME 90 tablet 3  . Cinnamon 500 MG capsule Take 500 mg by mouth daily.    . fish oil-omega-3 fatty acids 1000 MG capsule Take 1 g by mouth 3 (three) times daily.     Marland Kitchen MAGNESIUM PO Take 1 tablet by mouth daily.    . methocarbamol (ROBAXIN) 500 MG tablet Take 1 tablet (500 mg total) by mouth at bedtime as needed (for restless legs). 50 tablet 2  . Multiple Vitamins-Minerals (PRESERVISION AREDS 2 PO) Take 1 Can by mouth 2 (two) times daily.     . ramipril (ALTACE) 10 MG capsule Take 1 capsule (10 mg total) by mouth daily. 90 capsule 3  . tamsulosin (FLOMAX) 0.4 MG CAPS capsule TAKE ONE CAPSULE BY MOUTH ONCE DAILY (Patient taking differently: Take 0.4 mg by mouth daily. ) 90 capsule 3  . oxyCODONE (OXY IR/ROXICODONE) 5 MG immediate release tablet Take 1 tablet (5 mg total) by mouth every 6 (six) hours as needed for moderate pain. (Patient not taking: Reported on 05/12/2018) 6 tablet 0  . senna-docusate (SENOKOT-S)  8.6-50 MG tablet Take 1 tablet by mouth every other day.     No current facility-administered medications on file prior to visit.      Physical exam:  Vitals:   05/12/18 1344 05/12/18 1348  BP: (!) 180/98 (!) 176/81  Pulse: 69 69  Resp: 18   Temp: (!) 97 F (36.1 C)   TempSrc: Oral   Weight: 242 lb (109.8 kg)   Height: 6\' 4"  (1.93 m)     Neck: Well-healed right neck incision no incisional drainage or erythema  Neuro: Symmetric upper extremity lower extremity motor strength 5/5 no facial asymmetry  Assessment: Doing well status post right carotid endarterectomy.  Plan: The patient will follow-up in 6 months time for repeat carotid duplex exam and see our nurse  practitioner.  Ruta Hinds, MD [facility] [place] Office: [phone] Pager: [phone]

## 2018-06-09 ENCOUNTER — Ambulatory Visit: Payer: Medicare Other | Admitting: Family Medicine

## 2018-06-10 ENCOUNTER — Ambulatory Visit (INDEPENDENT_AMBULATORY_CARE_PROVIDER_SITE_OTHER): Payer: Medicare Other | Admitting: Family Medicine

## 2018-06-10 ENCOUNTER — Encounter: Payer: Self-pay | Admitting: Family Medicine

## 2018-06-10 VITALS — BP 158/74 | HR 72 | Temp 97.8°F | Ht 76.0 in | Wt 245.5 lb

## 2018-06-10 DIAGNOSIS — I10 Essential (primary) hypertension: Secondary | ICD-10-CM

## 2018-06-10 DIAGNOSIS — R600 Localized edema: Secondary | ICD-10-CM

## 2018-06-10 MED ORDER — HYDROCHLOROTHIAZIDE 12.5 MG PO CAPS: 12.5000 mg | ORAL_CAPSULE | Freq: Every day | ORAL | 3 refills | Status: DC | PRN

## 2018-06-10 NOTE — Assessment & Plan Note (Signed)
Longstanding, recently worsening. Has had recent cardiac evaluation which was reviewed.  Reviewed supportive care recommendations - avoiding salt/sodium in diet (already does), elevating legs, and pt agreeable to try compression stocking. Will also prescribe PRN thiazide diuretic to trial. Update if not improving with treatment for further evaluation including likely labwork/UA.

## 2018-06-10 NOTE — Progress Notes (Signed)
BP (!) 158/74 (BP Location: Right Arm, Patient Position: Sitting, Cuff Size: Normal)   Pulse 72   Temp 97.8 F (36.6 C) (Oral)   Ht 6\' 4"  (1.93 m)   Wt 245 lb 8 oz (111.4 kg)   SpO2 95%   BMI 29.88 kg/m   BP Readings from Last 3 Encounters:  06/10/18 (!) 158/74  05/12/18 (!) 176/81  04/26/18 (!) 144/81    CC: foot swelling Subjective:    Patient ID: Jonathan York, male    DOB: 10-Oct-1932, 84 y.o.   MRN: [id number]  HPI: R Demonte Dobratz is a 84 y.o. male presenting on 06/10/2018 for Foot Swelling (C/o swelling in bilateral feet on and off about 3 yrs. Swelling has now been constant for last couple of wks. )   Bilateral leg swelling, persisting over the last 3-4 wks. No pain, tenderness, redness, or warmth. Already avoids salt in diet. Already sits with legs elevated. Has not used compression stockings yet. Already avoids NSAIDs.  No HA, vision changes, CP/tightness, SOB.   He had a stroke 1 month ago, CEA 3 wks ago.  Wife currently in hospital after colonoscopy. She also broke her hip this summer.  H/o HTN with white coat component. Currently on amlodipine 5mg  daily (longterm use), ramipril 10mg  daily.  States home BP readings 125-160/70-80s. Wife is retired Therapist, sports and has been monitoring BPs at home.   Sees Ar[facility] yearly. Echocardiogram 01/2018 with EF 60-65%. Severely calcified aortic valve annulus, mod-severe dilated LA. Known afib on eliquis.   Relevant past medical, surgical, family and social history reviewed and updated as indicated. Interim medical history since our last visit reviewed. Allergies and medications reviewed and updated. Outpatient Medications Prior to Visit  Medication Sig Dispense Refill  . amLODipine (NORVASC) 5 MG tablet TAKE 1 TABLET BY MOUTH ONCE DAILY 90 tablet 3  . apixaban (ELIQUIS) 5 MG TABS tablet Take 1 tablet (5 mg total) by mouth 2 (two) times daily. 180 tablet 3  . atorvastatin (LIPITOR) 80 MG tablet TAKE 1 TABLET BY MOUTH AT BEDTIME 90 tablet 3  .  Cinnamon 500 MG capsule Take 500 mg by mouth daily.    . fish oil-omega-3 fatty acids 1000 MG capsule Take 1 g by mouth 3 (three) times daily.     Marland Kitchen MAGNESIUM PO Take 1 tablet by mouth daily.    . methocarbamol (ROBAXIN) 500 MG tablet Take 1 tablet (500 mg total) by mouth at bedtime as needed (for restless legs). 50 tablet 2  . Multiple Vitamins-Minerals (PRESERVISION AREDS 2 PO) Take 1 Can by mouth 2 (two) times daily.     . ramipril (ALTACE) 10 MG capsule Take 1 capsule (10 mg total) by mouth daily. 90 capsule 3  . tamsulosin (FLOMAX) 0.4 MG CAPS capsule TAKE ONE CAPSULE BY MOUTH ONCE DAILY (Patient taking differently: Take 0.4 mg by mouth daily. ) 90 capsule 3  . oxyCODONE (OXY IR/ROXICODONE) 5 MG immediate release tablet Take 1 tablet (5 mg total) by mouth every 6 (six) hours as needed for moderate pain. (Patient not taking: Reported on 05/12/2018) 6 tablet 0  . senna-docusate (SENOKOT-S) 8.6-50 MG tablet Take 1 tablet by mouth every other day.     No facility-administered medications prior to visit.      Per HPI unless specifically indicated in ROS section below Review of Systems     Objective:    BP (!) 158/74 (BP Location: Right Arm, Patient Position: Sitting, Cuff Size: Normal)   Pulse  72   Temp 97.8 F (36.6 C) (Oral)   Ht 6\' 4"  (1.93 m)   Wt 245 lb 8 oz (111.4 kg)   SpO2 95%   BMI 29.88 kg/m   Wt Readings from Last 3 Encounters:  06/10/18 245 lb 8 oz (111.4 kg)  05/12/18 242 lb (109.8 kg)  04/25/18 242 lb 12 oz (110.1 kg)    Physical Exam  Constitutional: He appears well-developed and well-nourished. No distress.  HENT:  Mouth/Throat: Oropharynx is clear and moist. No oropharyngeal exudate.  Cardiovascular: Normal rate, regular rhythm and normal heart sounds.  No murmur heard. Pulmonary/Chest: Effort normal and breath sounds normal. No respiratory distress. He has no wheezes. He has no rales.  Musculoskeletal: He exhibits edema (1+ pitting from dorsal feet to mid  shin).  2+ DP bilaterally  Skin: Skin is warm and dry. No rash noted. No erythema.  Psychiatric: He has a normal mood and affect.  Nursing note and vitals reviewed.  Lab Results  Component Value Date   CREATININE 1.03 04/26/2018   BUN 17 04/26/2018   NA 136 04/26/2018   K 4.2 04/26/2018   CL 104 04/26/2018   CO2 22 04/26/2018    Lab Results  Component Value Date   TSH 2.350 02/14/2018    Lab Results  Component Value Date   HGBA1C 7.7 (H) 04/18/2018       Assessment & Plan:   Problem List Items Addressed This Visit    Pedal edema - Primary    Longstanding, recently worsening. Has had recent cardiac evaluation which was reviewed.  Reviewed supportive care recommendations - avoiding salt/sodium in diet (already does), elevating legs, and pt agreeable to try compression stocking. Will also prescribe PRN thiazide diuretic to trial. Update if not improving with treatment for further evaluation including likely labwork/UA.       Essential hypertension    With white coat component. Pt should tolerate low dose PRN hctz. Continue amlodipine (lonstanding med) and ramipril.       Relevant Medications   hydrochlorothiazide (MICROZIDE) 12.5 MG capsule       Meds ordered this encounter  Medications  . hydrochlorothiazide (MICROZIDE) 12.5 MG capsule    Sig: Take 1 capsule (12.5 mg total) by mouth daily as needed (leg swelling).    Dispense:  30 capsule    Refill:  3   No orders of the defined types were placed in this encounter.   Follow up plan: Return if symptoms worsen or fail to improve.  Ria Bush, MD

## 2018-06-10 NOTE — Patient Instructions (Addendum)
Continue elevating legs, continue avoiding salt/sodium in the diet.  May try low dose water pill as needed for swelling (hydrochlorothiazide 12.5mg  daily as needed).  May try compression stockings - prescription provided today (take to local durable medical equipment store).  If not improving with this, let us know for further evaluation and possible blood work.   Edema Edema is when you have too much fluid in your body or under your skin. Edema may make your legs, feet, and ankles swell up. Swelling is also common in looser tissues, like around your eyes. This is a common condition. It gets more common as you get older. There are many possible causes of edema. Eating too much salt (sodium) and being on your feet or sitting for a long time can cause edema in your legs, feet, and ankles. Hot weather may make edema worse. Edema is usually painless. Your skin may look swollen or shiny. Follow these instructions at home:  Keep the swollen body part raised (elevated) above the level of your heart when you are sitting or lying down.  Do not sit still or stand for a long time.  Do not wear tight clothes. Do not wear garters on your upper legs.  Exercise your legs. This can help the swelling go down.  Wear elastic bandages or support stockings as told by your doctor.  Eat a low-salt (low-sodium) diet to reduce fluid as told by your doctor.  Depending on the cause of your swelling, you may need to limit how much fluid you drink (fluid restriction).  Take over-the-counter and prescription medicines only as told by your doctor. Contact a doctor if:  Treatment is not working.  You have heart, liver, or kidney disease and have symptoms of edema.  You have sudden and unexplained weight gain. Get help right away if:  You have shortness of breath or chest pain.  You cannot breathe when you lie down.  You have pain, redness, or warmth in the swollen areas.  You have heart, liver, or kidney  disease and get edema all of a sudden.  You have a fever and your symptoms get worse all of a sudden. Summary  Edema is when you have too much fluid in your body or under your skin.  Edema may make your legs, feet, and ankles swell up. Swelling is also common in looser tissues, like around your eyes.  Raise (elevate) the swollen body part above the level of your heart when you are sitting or lying down.  Follow your doctor's instructions about diet and how much fluid you can drink (fluid restriction). This information is not intended to replace advice given to you by your health care provider. Make sure you discuss any questions you have with your health care provider. Document Released: 12/23/2007 Document Revised: 07/24/2016 Document Reviewed: 07/24/2016 Elsevier Interactive Patient Education  2017 [employer].

## 2018-06-10 NOTE — Assessment & Plan Note (Signed)
With white coat component. Pt should tolerate low dose PRN hctz. Continue amlodipine (lonstanding med) and ramipril.

## 2018-07-03 ENCOUNTER — Other Ambulatory Visit: Payer: Self-pay | Admitting: Family Medicine

## 2018-07-04 NOTE — Telephone Encounter (Signed)
Electronic refill request. Methocarbamol Last office visit:   06/10/18 Acute Dr. Darnell Level Last Filled:    50 tablet 2 01/19/2017  Please advise.

## 2018-07-05 NOTE — Telephone Encounter (Signed)
Sent. Thanks.   

## 2018-07-21 ENCOUNTER — Other Ambulatory Visit: Payer: Self-pay | Admitting: Cardiovascular Disease

## 2018-08-22 ENCOUNTER — Telehealth: Payer: Self-pay | Admitting: Family Medicine

## 2018-08-22 DIAGNOSIS — R911 Solitary pulmonary nodule: Secondary | ICD-10-CM

## 2018-08-22 NOTE — Telephone Encounter (Signed)
Jonathan York has already set up the appointment.

## 2018-08-22 NOTE — Telephone Encounter (Signed)
Please call patient.  Due for follow-up chest CT.  Ordered.  Thanks.

## 2018-08-30 ENCOUNTER — Encounter: Payer: Self-pay | Admitting: Adult Health

## 2018-09-12 ENCOUNTER — Ambulatory Visit (INDEPENDENT_AMBULATORY_CARE_PROVIDER_SITE_OTHER)
Admission: RE | Admit: 2018-09-12 | Discharge: 2018-09-12 | Disposition: A | Discharge status: Home / Self Care | Payer: Medicare Other | Source: Ambulatory Visit | Attending: Family Medicine | Admitting: Family Medicine

## 2018-09-12 DIAGNOSIS — R918 Other nonspecific abnormal finding of lung field: Secondary | ICD-10-CM | POA: Diagnosis not present

## 2018-09-12 DIAGNOSIS — R911 Solitary pulmonary nodule: Secondary | ICD-10-CM | POA: Diagnosis not present

## 2018-09-16 ENCOUNTER — Ambulatory Visit: Payer: Self-pay | Admitting: Adult Health

## 2018-09-27 ENCOUNTER — Other Ambulatory Visit: Payer: Self-pay | Admitting: Cardiovascular Disease

## 2018-09-27 ENCOUNTER — Other Ambulatory Visit: Payer: Self-pay | Admitting: Family Medicine

## 2018-10-11 ENCOUNTER — Encounter (INDEPENDENT_AMBULATORY_CARE_PROVIDER_SITE_OTHER): Payer: Medicare Other | Admitting: Ophthalmology

## 2018-11-03 ENCOUNTER — Encounter: Payer: Self-pay | Admitting: Family Medicine

## 2018-11-03 ENCOUNTER — Ambulatory Visit (INDEPENDENT_AMBULATORY_CARE_PROVIDER_SITE_OTHER): Payer: Medicare Other | Admitting: Family Medicine

## 2018-11-03 ENCOUNTER — Other Ambulatory Visit: Payer: Self-pay

## 2018-11-03 ENCOUNTER — Ambulatory Visit (INDEPENDENT_AMBULATORY_CARE_PROVIDER_SITE_OTHER)
Admission: RE | Admit: 2018-11-03 | Discharge: 2018-11-03 | Disposition: A | Discharge status: Home / Self Care | Payer: Medicare Other | Source: Ambulatory Visit | Attending: Family Medicine | Admitting: Family Medicine

## 2018-11-03 VITALS — BP 140/86 | HR 83 | Temp 97.3°F | Ht 76.0 in | Wt 254.0 lb

## 2018-11-03 DIAGNOSIS — E119 Type 2 diabetes mellitus without complications: Secondary | ICD-10-CM

## 2018-11-03 DIAGNOSIS — R918 Other nonspecific abnormal finding of lung field: Secondary | ICD-10-CM | POA: Diagnosis not present

## 2018-11-03 DIAGNOSIS — R0602 Shortness of breath: Secondary | ICD-10-CM | POA: Diagnosis not present

## 2018-11-03 LAB — CBC WITH DIFFERENTIAL/PLATELET
Basophils Absolute: 0.1 10*3/uL (ref 0.0–0.1)
Basophils Relative: 0.6 % (ref 0.0–3.0)
Eosinophils Absolute: 0.2 10*3/uL (ref 0.0–0.7)
Eosinophils Relative: 2.5 % (ref 0.0–5.0)
HCT: 45.5 % (ref 39.0–52.0)
Hemoglobin: 15.4 g/dL (ref 13.0–17.0)
Lymphocytes Relative: 24.6 % (ref 12.0–46.0)
Lymphs Abs: 2 10*3/uL (ref 0.7–4.0)
MCHC: 33.9 g/dL (ref 30.0–36.0)
MCV: 88 fl (ref 78.0–100.0)
Monocytes Absolute: 0.9 10*3/uL (ref 0.1–1.0)
Monocytes Relative: 11 % (ref 3.0–12.0)
Neutro Abs: 5 10*3/uL (ref 1.4–7.7)
Neutrophils Relative %: 61.3 % (ref 43.0–77.0)
Platelets: 187 10*3/uL (ref 150.0–400.0)
RBC: 5.17 Mil/uL (ref 4.22–5.81)
RDW: 14.6 % (ref 11.5–15.5)
WBC: 8.1 10*3/uL (ref 4.0–10.5)

## 2018-11-03 LAB — COMPREHENSIVE METABOLIC PANEL
ALT: 20 U/L (ref 0–53)
AST: 16 U/L (ref 0–37)
Albumin: 4.1 g/dL (ref 3.5–5.2)
Alkaline Phosphatase: 53 U/L (ref 39–117)
BUN: 18 mg/dL (ref 6–23)
CO2: 29 mEq/L (ref 19–32)
Calcium: 9.3 mg/dL (ref 8.4–10.5)
Chloride: 102 mEq/L (ref 96–112)
Creatinine, Ser: 1 mg/dL (ref 0.40–1.50)
GFR: 71.29 mL/min (ref >60.00)
Glucose, Bld: 193 mg/dL — ABNORMAL HIGH (ref 70–99)
Potassium: 4.1 mEq/L (ref 3.5–5.1)
Sodium: 137 mEq/L (ref 135–145)
Total Bilirubin: 0.8 mg/dL (ref 0.2–1.2)
Total Protein: 6.8 g/dL (ref 6.0–8.3)

## 2018-11-03 LAB — HEMOGLOBIN A1C: Hgb A1c MFr Bld: 8.9 % — ABNORMAL HIGH (ref 4.6–6.5)

## 2018-11-03 LAB — BRAIN NATRIURETIC PEPTIDE: Pro B Natriuretic peptide (BNP): 204 pg/mL — ABNORMAL HIGH (ref 0.0–100.0)

## 2018-11-03 NOTE — Patient Instructions (Signed)
Go to the lab on the way out.  We'll contact you with your lab and xray report.  Don't change your meds for now but we may need to taper your amlodipine.  Take care.  Glad to see you.  Update me as needed.

## 2018-11-03 NOTE — Progress Notes (Signed)
BLE edema.  Up 10 lbs from fall of 2019.  He has been homebound due to covid pandemic.  HCTZ didn't help with BLE edema.  Off HCTZ currently.    No CP.  Some SOB, he attributed it to inc in abd girth.  Worse in the last few months.  SOB coming back from the mailbox, and that is 25 yards one way.    H/o AF but he hasn't noted sx from that.  He doesn't ever feel pulse irregularity but he has that at baseline. No FCNAVD.  Some cough at night.  No sputum.    DM2.  Not checked at home.  No sx of low sugars.  No meds.   Due for A1c.  See notes on labs.  He is avoiding extra salt and sugar at baseline.  PMH and SH reviewed  ROS: Per HPI unless specifically indicated in ROS section   Meds, vitals, and allergies reviewed.   GEN: nad, alert and oriented HEENT: mucous membranes moist NECK: supple w/o LA CV: IRR at baseline.   PULM: ctab, no inc wob ABD: soft, +bs EXT: 1-2+ edema SKIN: well perfused.  Speaking in complete sentences.  Diabetic foot exam: Normal inspection No skin breakdown No calluses  Normal DP pulses Normal sensation to light touch and monofilament Nails thickened-discuss getting nails trimmed to podiatry when possible.

## 2018-11-04 ENCOUNTER — Encounter: Payer: Self-pay | Admitting: Family Medicine

## 2018-11-04 ENCOUNTER — Other Ambulatory Visit: Payer: Self-pay | Admitting: Family Medicine

## 2018-11-04 DIAGNOSIS — R0602 Shortness of breath: Secondary | ICD-10-CM | POA: Insufficient documentation

## 2018-11-04 MED ORDER — FUROSEMIDE 20 MG PO TABS: 20.0000 mg | ORAL_TABLET | Freq: Every day | ORAL | 0 refills | Status: DC | PRN

## 2018-11-04 NOTE — Assessment & Plan Note (Signed)
Chronically worse in the last few months without any chest pain.  Discussed with patient about options.  At this point still okay for outpatient follow-up.  No change in meds at this point.  Hydrochlorothiazide did not help previously.  Check routine labs today.  Check chest x-ray.  See notes on labs and x-ray.  He agrees with plan. >25 minutes spent in face to face time with patient, >50% spent in counselling or coordination of care.

## 2018-11-04 NOTE — Assessment & Plan Note (Signed)
Not on meds currently.  Due for follow-up A1c.  See notes on labs.

## 2018-11-07 ENCOUNTER — Other Ambulatory Visit: Payer: Self-pay

## 2018-11-07 ENCOUNTER — Other Ambulatory Visit (INDEPENDENT_AMBULATORY_CARE_PROVIDER_SITE_OTHER): Payer: Medicare Other

## 2018-11-07 DIAGNOSIS — R0602 Shortness of breath: Secondary | ICD-10-CM

## 2018-11-07 LAB — BASIC METABOLIC PANEL
BUN: 19 mg/dL (ref 6–23)
CO2: 30 mEq/L (ref 19–32)
Calcium: 9.4 mg/dL (ref 8.4–10.5)
Chloride: 102 mEq/L (ref 96–112)
Creatinine, Ser: 0.95 mg/dL (ref 0.40–1.50)
GFR: 75.63 mL/min (ref >60.00)
Glucose, Bld: 210 mg/dL — ABNORMAL HIGH (ref 70–99)
Potassium: 4.4 mEq/L (ref 3.5–5.1)
Sodium: 137 mEq/L (ref 135–145)

## 2018-11-07 LAB — BRAIN NATRIURETIC PEPTIDE: Pro B Natriuretic peptide (BNP): 204 pg/mL — ABNORMAL HIGH (ref 0.0–100.0)

## 2018-11-08 ENCOUNTER — Ambulatory Visit (INDEPENDENT_AMBULATORY_CARE_PROVIDER_SITE_OTHER): Payer: Medicare Other | Admitting: Family Medicine

## 2018-11-08 DIAGNOSIS — E119 Type 2 diabetes mellitus without complications: Secondary | ICD-10-CM

## 2018-11-08 DIAGNOSIS — R0602 Shortness of breath: Secondary | ICD-10-CM

## 2018-11-08 NOTE — Progress Notes (Signed)
Interactive audio and video telecommunications were attempted between this provider and patient, however failed, due to patient having technical difficulties OR patient did not have access to video capability.  We continued and completed visit with audio only.   Virtual Visit via Telephone Note  I connected with patient on 11/08/18 at 8:35 AM by telephone and verified that I am speaking with the correct person using two identifiers.  Location of patient: home.    Location of MD: [facility] Name of referring provider (if blank then none associated): Names per persons and role in encounter:  MD: Earlyne Iba, Patient: name listed above.    I discussed the limitations, risks, security and privacy concerns of performing an evaluation and management service by telephone and the availability of in person appointments. I also discussed with the patient that there may be a patient responsible charge related to this service. The patient expressed understanding and agreed to proceed.  History of Present Illness:  SOB/BLE edema.  He is not yet on lasix, he is going to check with pharmacy.  rx prev sent but was unable to pick up med so far.  Weight is unchanged.  SOB is the same.  Echo pending.  Rationale for echo and lasix use dw pt.  Routine cautions d/w pt.   DM2.  A1c up. He has been "sitting around the house and eating ice cream."  He'll cut out ice cream and start checking his sugar and update me.  Defer meds for now.  I want to address edema first and I want to avoid hypoglycemia.  He agrees.    Observations/Objective:  nad Speaking in complete sentences.   Assessment and Plan: SOB/Edema.  He'll check on getting lasix. Will take daily as needed for swelling and then update me.  Echo pending.  May need to taper amlodipine depending on response to lasix.  He could have multifactorial SOB/BLE edema (AF/inc in weight, amlodipine), d/w pt.    DM2.  A1c up, d/w pt.  He'll check sugar and  update me next week.  Defer meds for now.  I want to address edema first and I want to avoid hypoglycemia.  He agrees.   Follow Up Instructions: as above.    He'll get echo.  He'll start prn lasix and update me about his sugar/swelling We'll see about labs after I get his echo results and hear about his response to lasix and about his sugar.  He agrees.    I discussed the assessment and treatment plan with the patient. The patient was provided an opportunity to ask questions and all were answered. The patient agreed with the plan and demonstrated an understanding of the instructions.   The patient was advised to call back or seek an in-person evaluation if the symptoms worsen or if the condition fails to improve as anticipated.  I provided 22 minutes of non-face-to-face time during this encounter.  Elsie Stain, MD

## 2018-11-09 NOTE — Assessment & Plan Note (Signed)
DM2.  A1c up, d/w pt.  He'll check sugar and update me next week.  Defer meds for now.  I want to address edema first and I want to avoid hypoglycemia.  He agrees.

## 2018-11-09 NOTE — Assessment & Plan Note (Signed)
He'll check on getting lasix. Will take daily as needed for swelling and then update me.  Echo pending.  May need to taper amlodipine depending on response to lasix.  He could have multifactorial SOB/BLE edema (AF/inc in weight, amlodipine), d/w pt.

## 2018-11-10 ENCOUNTER — Ambulatory Visit (INDEPENDENT_AMBULATORY_CARE_PROVIDER_SITE_OTHER): Payer: Medicare Other

## 2018-11-10 ENCOUNTER — Other Ambulatory Visit: Payer: Self-pay

## 2018-11-10 DIAGNOSIS — R0602 Shortness of breath: Secondary | ICD-10-CM | POA: Diagnosis not present

## 2018-11-11 ENCOUNTER — Encounter (INDEPENDENT_AMBULATORY_CARE_PROVIDER_SITE_OTHER): Payer: Medicare Other | Admitting: Ophthalmology

## 2018-11-11 DIAGNOSIS — I10 Essential (primary) hypertension: Secondary | ICD-10-CM | POA: Diagnosis not present

## 2018-11-11 DIAGNOSIS — H35033 Hypertensive retinopathy, bilateral: Secondary | ICD-10-CM

## 2018-11-11 DIAGNOSIS — E11319 Type 2 diabetes mellitus with unspecified diabetic retinopathy without macular edema: Secondary | ICD-10-CM

## 2018-11-11 DIAGNOSIS — H338 Other retinal detachments: Secondary | ICD-10-CM

## 2018-11-11 DIAGNOSIS — H353132 Nonexudative age-related macular degeneration, bilateral, intermediate dry stage: Secondary | ICD-10-CM

## 2018-11-11 DIAGNOSIS — E113293 Type 2 diabetes mellitus with mild nonproliferative diabetic retinopathy without macular edema, bilateral: Secondary | ICD-10-CM

## 2018-11-11 DIAGNOSIS — H43813 Vitreous degeneration, bilateral: Secondary | ICD-10-CM

## 2018-11-11 LAB — HM DIABETES EYE EXAM

## 2018-11-14 ENCOUNTER — Other Ambulatory Visit: Payer: Self-pay | Admitting: Family Medicine

## 2018-11-14 ENCOUNTER — Telehealth: Payer: Self-pay | Admitting: *Deleted

## 2018-11-14 DIAGNOSIS — R0602 Shortness of breath: Secondary | ICD-10-CM

## 2018-11-14 NOTE — Telephone Encounter (Signed)
Addressed through results notes  

## 2018-11-14 NOTE — Telephone Encounter (Signed)
Left message with family requesting pt to call the office back regarding imaging results

## 2018-11-14 NOTE — Telephone Encounter (Signed)
Patient returning a call from the office.   PHONE- [phone]

## 2018-11-15 NOTE — Telephone Encounter (Signed)
Pt left v/m returning a call from 11/14/18. Pt request cb.

## 2018-11-15 NOTE — Telephone Encounter (Signed)
Already spoke with patient, please refer to lab result encounter for details.

## 2018-11-16 ENCOUNTER — Telehealth: Payer: Self-pay

## 2018-11-16 NOTE — Telephone Encounter (Signed)

## 2018-11-18 ENCOUNTER — Telehealth (INDEPENDENT_AMBULATORY_CARE_PROVIDER_SITE_OTHER): Payer: Medicare Other | Admitting: Cardiovascular Disease

## 2018-11-18 ENCOUNTER — Encounter: Payer: Self-pay | Admitting: Cardiovascular Disease

## 2018-11-18 ENCOUNTER — Other Ambulatory Visit: Payer: Self-pay

## 2018-11-18 VITALS — BP 140/70 | HR 72 | Ht 75.0 in | Wt 245.0 lb

## 2018-11-18 DIAGNOSIS — I482 Chronic atrial fibrillation, unspecified: Secondary | ICD-10-CM

## 2018-11-18 NOTE — Progress Notes (Signed)
Virtual Visit via Telephone Note   This visit type was conducted due to national recommendations for restrictions regarding the COVID-19 Pandemic (e.g. social distancing) in an effort to limit this patient's exposure and mitigate transmission in our community.  Due to his co-morbid illnesses, this patient is at least at moderate risk for complications without adequate follow up.  This format is felt to be most appropriate for this patient at this time.  The patient did not have access to video technology/had technical difficulties with video requiring transitioning to audio format only (telephone).  All issues noted in this document were discussed and addressed.  No physical exam could be performed with this format.  Please refer to the patient's chart for his  consent to telehealth for [name].   Date:  11/18/2018   ID:  Jonathan York, DOB Nov 26, 1932, MRN [id number]  Patient Location: Home Provider Location: Office  PCP:  Tonia Ghent, MD  Cardiologist:  No primary care provider on file.  Electrophysiologist:  None   Evaluation Performed:  Follow-Up Visit  Chief Complaint: Shortness of breath  History of Present Illness:    Jonathan York is a 85 y.o. male who was reached via phone for a follow-up visit regarding chronic atrial fibrillation. He has chronic medical conditions that include hypertension, hyperlipidemia, type 2 diabetes and carotid disease status post bilateral carotid endarterectomy. He has lung cancer status post resection and chemotherapy in 2002. Echocardiogram in January 2017 showed normal LV systolic function, mild aortic regurgitation and mildly to moderately dilated left atrium with mild pulmonary hypertension. He had a syncopal episode in the setting of choking on one of his medications. It was felt to be vasovagal.  He was seen by Dr. Damita Dunnings recently and complained of increased shortness of breath and leg edema. He had labs done which showed unremarkable  basic metabolic profile.  BNP was 204.  CBC was normal. He underwent an echocardiogram which showed normal LV systolic function, mild pulmonary hypertension, moderately dilated left atrium with very mild aortic stenosis.  He had a 10 pound weight gain noted which he attributed to less physical activities.  He was prescribed furosemide 20 mg daily and he has been taking that.  Since then, his weight went down 9 pounds.  He feels better.  No chest pain.  The patient does not have symptoms concerning for COVID-19 infection (fever, chills, cough, or new shortness of breath).    Past Medical History:  Diagnosis Date  . Abdominal bruit 05/05/2012  . Arthritis    back pain, past lumbar surgery   . BPH (benign prostatic hypertrophy)   . Carotid arterial disease (HCC)    a. s/p left-sided CEA  . Chronic atrial fibrillation    a. CHADS2VASc => 5 (HTN, age x 2, DM, vascular disease); Eliquis  . Diabetes mellitus type II    diet controlled   . HLD (hyperlipidemia)   . HTN (hypertension)   . Lung cancer ([place])    a. s/p resection and chemo in 2002  . TIA (transient ischemic attack) 2019   Past Surgical History:  Procedure Laterality Date  . ARCH AORTOGRAM  05/27/2012   Procedure: ARCH AORTOGRAM;  Surgeon: Elam Dutch, MD;  Location: [facility] CATH LAB;  Service: Cardiovascular;;  . CARDIOVERSION  06/29/2012   Procedure: CARDIOVERSION;  Surgeon: Thayer Headings, MD;  Location: [facility] ENDOSCOPY;  Service: Cardiovascular;  Laterality: N/A;  . CAROTID ANGIOGRAM N/A 05/27/2012   Procedure: CAROTID ANGIOGRAM;  Surgeon:  Elam Dutch, MD;  Location: [facility] CATH LAB;  Service: Cardiovascular;  Laterality: N/A;  . CAROTID ENDARTERECTOMY Left 05/30/12  . CATARACT EXTRACTION     right  . ENDARTERECTOMY  05/30/2012   Procedure: ENDARTERECTOMY CAROTID;  Surgeon: Elam Dutch, MD;  Location: [place];  Service: Vascular;  Laterality: Left;  . ENDARTERECTOMY Right 04/25/2018   Procedure: ENDARTERECTOMY RIGHT  CAROTID;  Surgeon: Elam Dutch, MD;  Location: [place];  Service: Vascular;  Laterality: Right;  . EYE SURGERY     Jonathan detached retina- 1990's, IOL implants also   . [name]  . JOINT REPLACEMENT     knee- bilateral- 2009 and 2012  . LOBECTOMY  03/15/01   [facility])  . NOSE SURGERY    . PATCH ANGIOPLASTY Right 04/25/2018   Procedure: PATCH ANGIOPLASTY RIGHT CAROTID ARTERY USING HEMASHILED PLATINUM FINESSE PATCH;  Surgeon: Elam Dutch, MD;  Location: [place];  Service: Vascular;  Laterality: Right;  . RETINAL DETACHMENT SURGERY     right  . SPINE SURGERY  approx 2006   Ruptered Disc  . TONSILLECTOMY     1939  . TOTAL KNEE ARTHROPLASTY     x2 per Dr. Rush Farmer  . WEDGE RESECTION  03/15/01   [facility])     Current Meds  Medication Sig  . amLODipine (NORVASC) 5 MG tablet TAKE 1 TABLET BY MOUTH ONCE DAILY  . apixaban (ELIQUIS) 5 MG TABS tablet Take 1 tablet (5 mg total) by mouth 2 (two) times daily.  Marland Kitchen atorvastatin (LIPITOR) 80 MG tablet TAKE 1 TABLET BY MOUTH AT BEDTIME  . Cinnamon 500 MG capsule Take 500 mg by mouth daily.  . fish oil-omega-3 fatty acids 1000 MG capsule Take 1 g by mouth 3 (three) times daily.   . furosemide (LASIX) 20 MG tablet Take 1 tablet (20 mg total) by mouth daily as needed for fluid. (Patient taking differently: Take 20 mg by mouth daily. )  . MAGNESIUM PO Take 1 tablet by mouth daily.  . methocarbamol (ROBAXIN) 500 MG tablet TAKE 1 TABLET BY MOUTH AT BEDTIME AS NEEDED FOR  RESTLESS  LEGS  . Multiple Vitamins-Minerals (PRESERVISION AREDS 2 PO) Take 1 Can by mouth 2 (two) times daily.   . ramipril (ALTACE) 10 MG capsule Take 1 capsule (10 mg total) by mouth daily.  . tamsulosin (FLOMAX) 0.4 MG CAPS capsule Take 1 capsule by mouth once daily     Allergies:   Xarelto [rivaroxaban]   Social History   Tobacco Use  . Smoking status: Former Smoker    Packs/day: 1.00    Years: 32.00    Pack years: 32.00    Types: Cigarettes     Last attempt to quit: 09/03/1982    Years since quitting: 36.2  . Smokeless tobacco: Never Used  Substance Use Topics  . Alcohol use: No    Alcohol/week: 0.0 standard drinks    Comment:    . Drug use: No     Family Hx: The patient's family history includes Dementia in his brother and mother; Diabetes in his mother and son; Emphysema in his father; Heart disease in his son; Hypertension in his brother, mother, and son; Hypothyroidism in his sister; Stroke in his son. There is no history of Prostate cancer or Colon cancer.  ROS:   Please see the history of present illness.     All other systems reviewed and are negative.   Prior CV studies:   The following  studies were reviewed today:  I reviewed recent echocardiogram with him  Labs/Other Tests and Data Reviewed:    EKG:  No ECG reviewed.  Recent Labs: 02/14/2018: TSH 2.350 11/03/2018: ALT 20; Hemoglobin 15.4; Platelets 187.0 11/07/2018: BUN 19; Creatinine, Ser 0.95; Potassium 4.4; Pro B Natriuretic peptide (BNP) 204.0; Sodium 137   Recent Lipid Panel Lab Results  Component Value Date/Time   CHOL 156 02/14/2018 07:11 AM   TRIG 88 02/14/2018 07:11 AM   HDL 49 02/14/2018 07:11 AM   CHOLHDL 3.2 02/14/2018 07:11 AM   LDLCALC 89 02/14/2018 07:11 AM    Wt Readings from Last 3 Encounters:  11/18/18 245 lb (111.1 kg)  11/03/18 254 lb (115.2 kg)  06/10/18 245 lb 8 oz (111.4 kg)     Objective:    Vital Signs:  BP 140/70   Pulse 72   Ht 6\' 3"  (1.905 m)   Wt 245 lb (111.1 kg)   BMI 30.62 kg/m    VITAL SIGNS:  reviewed  ASSESSMENT & PLAN:    1.  Chronic atrial fibrillation: Ventricular rate is controlled without any medications.  He is tolerating anticoagulation with Eliquis with no reported side effects.  I reviewed his labs including CBC and basic metabolic profile which were unremarkable.  2. Essential hypertension:  Blood pressure is reasonably controlled  3. Hyperlipidemia: Currently on atorvastatin 80 mg  once daily.  4.  Recent shortness of breath and edema: I suspect some degree of diastolic heart failure.  He responded very well to small dose furosemide and he is back to baseline.  I discussed with him the importance of low-sodium diet.  I also explained to him that he can take furosemide as needed.  5.  Carotid artery disease status post bilateral carotid endarterectomy: Followed by [facility].  COVID-19 Education: The signs and symptoms of COVID-19 were discussed with the patient and how to seek care for testing (follow up with PCP or arrange E-visit).  The importance of social distancing was discussed today.  Time:   Today, I have spent 21 minutes with the patient with telehealth technology discussing the above problems.     Medication Adjustments/Labs and Tests Ordered: Current medicines are reviewed at length with the patient today.  Concerns regarding medicines are outlined above.   Tests Ordered: No orders of the defined types were placed in this encounter.   Medication Changes: No orders of the defined types were placed in this encounter.   Disposition:  Follow up in 6 month(s)  Signed, Kathlyn Sacramento, MD  11/18/2018 8:05 AM    [place]

## 2018-11-18 NOTE — Patient Instructions (Signed)
Medication Instructions:  Continue same medications.  You can take furosemide as needed based on symptoms of shortness of breath, leg edema and weight trend as discussed If you need a refill on your cardiac medications before your next appointment, please call your pharmacy.   Lab work: None If you have labs (blood work) drawn today and your tests are completely normal, you will receive your results only by: Marland Kitchen MyChart Message (if you have MyChart) OR . A paper copy in the mail If you have any lab test that is abnormal or we need to change your treatment, we will call you to review the results.  Testing/Procedures: None  Follow-Up: At [facility], you and your health needs are our priority.  As part of our continuing mission to provide you with exceptional heart care, we have created designated Provider Care Teams.  These Care Teams include your primary Cardiologist (physician) and Advanced Practice Providers (APPs -  Physician Assistants and Nurse Practitioners) who all work together to provide you with the care you need, when you need it. You will need a follow up appointment in 6 months.  Please call our office 2 months in advance to schedule this appointment.  You may see Dr. Fletcher Anon or one of the following Advanced Practice Providers on your designated Care Team:   Murray Hodgkins, NP Christell Faith, PA-C . Marrianne Mood, PA-C

## 2018-11-21 ENCOUNTER — Other Ambulatory Visit: Payer: Self-pay

## 2018-11-21 NOTE — Patient Outreach (Signed)
[facility]) Care Management  11/21/2018  R Xaivier Malay 03-15-1933 [id number]   Medication Adherence call to Mr. Lee Kuang Hippa Identifiers Verify spoke with patient he is due on Atorvastatin 80 mg patient explain he is taking 1 tablet daily and never miss a dose patient is due on the prescription but prefers to order it himself. Mr. Faucett is showing past due under [place].  [place] Management Direct Dial [phone]  Fax [phone] [email or web address]

## 2018-11-23 ENCOUNTER — Other Ambulatory Visit (INDEPENDENT_AMBULATORY_CARE_PROVIDER_SITE_OTHER): Payer: Medicare Other

## 2018-11-23 ENCOUNTER — Other Ambulatory Visit: Payer: Self-pay

## 2018-11-23 DIAGNOSIS — R0602 Shortness of breath: Secondary | ICD-10-CM

## 2018-11-23 LAB — BASIC METABOLIC PANEL
BUN: 25 mg/dL — ABNORMAL HIGH (ref 6–23)
CO2: 30 mEq/L (ref 19–32)
Calcium: 9.7 mg/dL (ref 8.4–10.5)
Chloride: 101 mEq/L (ref 96–112)
Creatinine, Ser: 1 mg/dL (ref 0.40–1.50)
GFR: 71.28 mL/min (ref >60.00)
Glucose, Bld: 186 mg/dL — ABNORMAL HIGH (ref 70–99)
Potassium: 4.3 mEq/L (ref 3.5–5.1)
Sodium: 139 mEq/L (ref 135–145)

## 2018-11-23 LAB — BRAIN NATRIURETIC PEPTIDE: Pro B Natriuretic peptide (BNP): 177 pg/mL — ABNORMAL HIGH (ref 0.0–100.0)

## 2018-11-23 NOTE — Addendum Note (Signed)
Addended by: Ellamae Sia on: 11/23/2018 09:20 AM   Modules accepted: Orders

## 2018-11-30 ENCOUNTER — Other Ambulatory Visit: Payer: Self-pay | Admitting: Family Medicine

## 2018-11-30 MED ORDER — FUROSEMIDE 20 MG PO TABS: 20.0000 mg | ORAL_TABLET | Freq: Every day | ORAL | 2 refills | Status: DC | PRN

## 2018-12-07 ENCOUNTER — Telehealth: Payer: Self-pay | Admitting: Family Medicine

## 2018-12-07 MED ORDER — FUROSEMIDE 20 MG PO TABS: 20.0000 mg | ORAL_TABLET | Freq: Every day | ORAL | 2 refills | Status: DC | PRN

## 2018-12-07 NOTE — Telephone Encounter (Signed)
Glad he is better.  rx sent.  Thanks.

## 2018-12-07 NOTE — Telephone Encounter (Signed)
Spoke with pt wife and made her aware that we sent in refill for lasix.

## 2018-12-07 NOTE — Telephone Encounter (Signed)
Patient called back with update to his legs swelling.  Patient stated that his legs are still swollen but it has improved from last week. Patient only has 1 tablet of Lasix left.  Please advise.

## 2019-01-04 ENCOUNTER — Other Ambulatory Visit: Payer: Self-pay

## 2019-01-04 MED ORDER — RAMIPRIL 10 MG PO CAPS: 10.0000 mg | ORAL_CAPSULE | Freq: Every day | ORAL | 3 refills | Status: DC

## 2019-01-23 ENCOUNTER — Ambulatory Visit (INDEPENDENT_AMBULATORY_CARE_PROVIDER_SITE_OTHER): Payer: Medicare Other | Admitting: Family Medicine

## 2019-01-23 ENCOUNTER — Other Ambulatory Visit: Payer: Self-pay

## 2019-01-23 ENCOUNTER — Encounter: Payer: Self-pay | Admitting: Family Medicine

## 2019-01-23 VITALS — BP 140/72 | HR 84 | Temp 97.7°F | Ht 76.0 in | Wt 247.0 lb

## 2019-01-23 DIAGNOSIS — M79604 Pain in right leg: Secondary | ICD-10-CM

## 2019-01-23 NOTE — Progress Notes (Signed)
Mayvis Agudelo T. Arthea Nobel, MD Primary Care and [place] at [facility] [place], [address] Phone: [phone]  FAX: [phone]  Jonathan Merrik Puebla - 85 y.o. male  MRN [id number]  Date of Birth: 1933/06/10  Visit Date: 01/23/2019  PCP: Tonia Ghent, MD  Referred by: Tonia Ghent, MD  Chief Complaint  Patient presents with  . Fall    tripped over chair... right lower leg and knee... right elbow... x 1 weeks ago... lower leg feels "funny"  pt reports tender to touch and when he walks   Subjective:   Jonathan York is a 85 y.o. very pleasant male patient who presents with the following:  Trauma case as above.  3 weeks ago.   Tried to jump over a chair and hs feet came down and banged up and hurts in the lower leg.   At that point, he struck his leg as well as his knee and his right elbow.  Initially he had some pain, bruising, and swelling he also had some bruising and a hematoma on his right elbow, but that has resolved.  He does still have some pain in his lower extremity on the right anteriorly as well as laterally.  There is no appreciable hematoma.  Past Medical History, Surgical History, Social History, Family History, Problem List, Medications, and Allergies have been reviewed and updated if relevant.  Patient Active Problem List   Diagnosis Date Noted  . Short of breath on exertion 11/04/2018  . Pedal edema 06/10/2018  . Carotid stenosis 04/25/2018  . Imbalance   . Slurring of speech   . Stenosis of right carotid artery   . History of CEA (carotid endarterectomy)   . Diabetes mellitus ([place])   . CVA (cerebral vascular accident) ([place]) 02/13/2018  . Diabetic retinopathy ([place]) 10/11/2017  . Constipation 02/18/2017  . GERD (gastroesophageal reflux disease) 01/21/2017  . Lower urinary tract symptoms (LUTS) 01/02/2015  . Seborrheic keratoses 01/25/2014  . Advance care planning 01/25/2014  . Back pain 01/25/2013  .  Second degree AV block, Mobitz type I 09/18/2012  . A-fib ([place]) 05/05/2012  . Left carotid bruit 05/05/2012  . Abdominal bruit 05/05/2012  . Skin lesion 01/26/2012  . Cough 12/18/2011  . Medicare annual wellness visit, subsequent 11/03/2011  . Vertigo 11/02/2011  . Hyperlipidemia with target LDL less than 100 11/19/2010  . CHRONIC OBSTRUCTIVE PULMONARY DISEASE 01/01/2010  . 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 RESTLESS LEG SYNDROME 07/01/2009  . NEOPLASM OF UNCERTAIN BEHAVIOR OF SKIN 10/18/2008  . Type 2 diabetes mellitus with ophthalmic complication ([place]) 88/91/6945  . Essential hypertension 03/17/2007  . HEMORRHOIDS 03/17/2007  . BENIGN PROSTATIC HYPERTROPHY 03/17/2007  . DEGENERATIVE JOINT DISEASE, KNEES, BILATERAL 03/17/2007  . 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 Abdominal bruit 05/05/2012  . Arthritis    back pain, past lumbar surgery   . BPH (benign prostatic hypertrophy)   . Carotid arterial disease (HCC)    a. s/p left-sided CEA  . Chronic atrial fibrillation    a. CHADS2VASc => 5 (HTN, age x 2, DM, vascular disease); Eliquis  . Diabetes mellitus type II    diet controlled   . HLD (hyperlipidemia)   . HTN (hypertension)   . Lung cancer ([place])    a. s/p resection and chemo in 2002  . TIA (transient ischemic attack) 2019    Past Surgical History:  Procedure Laterality Date  .  ARCH AORTOGRAM  05/27/2012   Procedure: ARCH AORTOGRAM;  Surgeon: Elam Dutch, MD;  Location: [facility] CATH LAB;  Service: Cardiovascular;;  . CARDIOVERSION  06/29/2012   Procedure: CARDIOVERSION;  Surgeon: Thayer Headings, MD;  Location: [facility] ENDOSCOPY;  Service: Cardiovascular;  Laterality: N/A;  . CAROTID ANGIOGRAM N/A 05/27/2012   Procedure: CAROTID Cyril Loosen;  Surgeon: Elam Dutch, MD;  Location: [facility] CATH LAB;  Service: Cardiovascular;  Laterality: N/A;  . CAROTID ENDARTERECTOMY Left 05/30/12  . CATARACT EXTRACTION     right  .  ENDARTERECTOMY  05/30/2012   Procedure: ENDARTERECTOMY CAROTID;  Surgeon: Elam Dutch, MD;  Location: [place];  Service: Vascular;  Laterality: Left;  . ENDARTERECTOMY Right 04/25/2018   Procedure: ENDARTERECTOMY RIGHT CAROTID;  Surgeon: Elam Dutch, MD;  Location: [place];  Service: Vascular;  Laterality: Right;  . EYE SURGERY     Jonathan detached retina- 1990's, IOL implants also   . [place]  . JOINT REPLACEMENT     knee- bilateral- 2009 and 2012  . LOBECTOMY  03/15/01   [facility])  . NOSE SURGERY    . PATCH ANGIOPLASTY Right 04/25/2018   Procedure: PATCH ANGIOPLASTY RIGHT CAROTID ARTERY USING HEMASHILED PLATINUM FINESSE PATCH;  Surgeon: Elam Dutch, MD;  Location: [place];  Service: Vascular;  Laterality: Right;  . RETINAL DETACHMENT SURGERY     right  . SPINE SURGERY  approx 2006   Ruptered Disc  . TONSILLECTOMY     1939  . TOTAL KNEE ARTHROPLASTY     x2 per Dr. Rush Farmer  . WEDGE RESECTION  03/15/01   [facility])    Social History   Socioeconomic History  . Marital status: Married    Spouse name: Not on file  . Number of children: 3  . Years of education: Not on file  . Highest education level: Not on file  Occupational History  . 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 Financial resource strain: Not on file  . Food insecurity    Worry: Not on file    Inability: Not on file  . Transportation needs    Medical: Not on file    Non-medical: Not on file  Tobacco Use  . Smoking status: Former Smoker    Packs/day: 1.00    Years: 32.00    Pack years: 32.00    Types: Cigarettes    Quit date: 09/03/1982    Years since quitting: 36.4  . Smokeless tobacco: Never Used  Substance and Sexual Activity  . Alcohol use: No    Alcohol/week: 0.0 standard drinks    Comment:    . Drug use: No  . Sexual activity: Yes  Lifestyle  . Physical activity    Days per week: Not on file    Minutes per session: Not on file  . Stress:  Not on file  Relationships  . Social Herbalist on phone: Not on file    Gets together: Not on file    Attends religious service: Not on file    Active member of club or organization: Not on file    Attends meetings of clubs or organizations: Not on file    Relationship status: Not on file  . Intimate partner violence    Fear of current or ex partner: Not on file    Emotionally abused: Not on file    Physically abused: Not on file    Forced sexual activity: Not on  file  Other Topics Concern  . Not on file  Social History Narrative   Retired Personal assistant   From [employer]   Married happily since 1964    Family History  Problem Relation Age of Onset  . Emphysema Father   . Dementia Mother   . Diabetes Mother   . Hypertension Mother   . Hypothyroidism Sister   . Dementia Brother   . Hypertension Brother   . Diabetes Son   . Heart disease Son        Heart Disease before age 71- Open Heart 2010  . Hypertension Son   . Stroke Son        while on coumadin  . Prostate cancer Neg Hx   . Colon cancer Neg Hx     Allergies  Allergen Reactions  . Xarelto [Rivaroxaban] Other (See Comments)    vertigo    Medication list reviewed and updated in full in [place].  GEN: No fevers, chills. Nontoxic. Primarily MSK c/o today. MSK: Detailed in the HPI GI: tolerating PO intake without difficulty Neuro: No numbness, parasthesias, or tingling associated. Otherwise the pertinent positives of the ROS are noted above.   Objective:   BP 140/72   Pulse 84   Temp 97.7 F (36.5 C) (Oral)   Ht 6\' 4"  (1.93 m)   Wt 247 lb (112 kg)   SpO2 96%   BMI 30.07 kg/m    GEN: WDWN, NAD, Non-toxic, Alert & Oriented x 3 HEENT: Atraumatic, Normocephalic.  Ears and Nose: No external deformity. EXTR: No clubbing/cyanosis/edema NEURO: Normal gait.  PSYCH: Normally interactive. Conversant. Not depressed or anxious appearing.  Calm demeanor.    Knee moves well with no ligamentous  laxity, no significant joint space tenderness and no effusion.  Right lower extremity is tender to palpation in the anterior compartment diffusely as well as in the lateral compartment.  The foot and ankle have no tenderness significantly throughout all bones  Radiology: No results found.  Assessment and Plan:     ICD-10-CM   1. Acute pain of right lower extremity  M79.604    Pain at anterior and lateral compartments with no concern for compartment syndrome.   Wait and expect resolution at 3-4 weeks more.   Follow-up: No follow-ups on file.  No orders of the defined types were placed in this encounter.  No orders of the defined types were placed in this encounter.   Signed,  Maud Deed. Ahjanae Cassel, MD   Outpatient Encounter Medications as of 01/23/2019  Medication Sig  . amLODipine (NORVASC) 5 MG tablet TAKE 1 TABLET BY MOUTH ONCE DAILY  . apixaban (ELIQUIS) 5 MG TABS tablet Take 1 tablet (5 mg total) by mouth 2 (two) times daily.  Marland Kitchen atorvastatin (LIPITOR) 80 MG tablet TAKE 1 TABLET BY MOUTH AT BEDTIME  . Cinnamon 500 MG capsule Take 500 mg by mouth daily.  . fish oil-omega-3 fatty acids 1000 MG capsule Take 1 g by mouth 3 (three) times daily.   . furosemide (LASIX) 20 MG tablet Take 1-2 tablets (20-40 mg total) by mouth daily as needed for fluid.  Marland Kitchen MAGNESIUM PO Take 1 tablet by mouth daily.  . methocarbamol (ROBAXIN) 500 MG tablet TAKE 1 TABLET BY MOUTH AT BEDTIME AS NEEDED FOR  RESTLESS  LEGS  . Multiple Vitamins-Minerals (PRESERVISION AREDS 2 PO) Take 1 Can by mouth 2 (two) times daily.   . ramipril (ALTACE) 10 MG capsule Take 1 capsule (10 mg total) by mouth daily.  Marland Kitchen  tamsulosin (FLOMAX) 0.4 MG CAPS capsule Take 1 capsule by mouth once daily   No facility-administered encounter medications on file as of 01/23/2019.

## 2019-01-25 ENCOUNTER — Other Ambulatory Visit: Payer: Self-pay

## 2019-01-25 NOTE — Patient Outreach (Signed)
[facility]) Care Management  01/25/2019  R Sidhant Helderman 1933/02/24 [id number]   Medication Adherence call to Mr. Ahmod Gillespie Hippa Identifiers Verify spoke with patient he is past due on Ramipril 10 mg patient explain he is taking 1 capsule daily but sometimes he forgets to take and ends up with extras patient has medication at this time and will order when due. Mr. Youngblood is showing past due under [place].   [facility] Management Direct Dial [phone]  Fax [phone] [email or web address]

## 2019-01-26 ENCOUNTER — Other Ambulatory Visit: Payer: Self-pay | Admitting: Family Medicine

## 2019-01-26 ENCOUNTER — Other Ambulatory Visit: Payer: Self-pay | Admitting: Physician Assistant

## 2019-01-26 NOTE — Telephone Encounter (Signed)
Refill Request.  

## 2019-01-26 NOTE — Telephone Encounter (Signed)
Eliquis 5mg  refill request received; pt is 85 yrs old, wt-112kg, Crea-1.00 on 11/23/2018, last seen by Dr. Fletcher Anon on 11/18/2018 via telemedicine; will send in refill to requested pharmacy.

## 2019-02-27 ENCOUNTER — Other Ambulatory Visit: Payer: Self-pay

## 2019-02-27 NOTE — Patient Outreach (Signed)
[facility]) Care Management  02/27/2019  [name] 1933/01/19 [id number]   Medication Adherence call to [name] Hippa Identifiers Verify spoke with patient he is past due on Atorvastatin 80 mg patient explain he is taking 1 tablet every evening and has enough for two weeks patient said sometimes pharmacy fill the prescription early patient is showing past due under [name].  [name] Management Direct Dial [phone]  Fax [phone] [name]

## 2019-03-13 ENCOUNTER — Other Ambulatory Visit (INDEPENDENT_AMBULATORY_CARE_PROVIDER_SITE_OTHER): Payer: Medicare Other

## 2019-03-13 ENCOUNTER — Other Ambulatory Visit: Payer: Self-pay | Admitting: Family Medicine

## 2019-03-13 ENCOUNTER — Ambulatory Visit (INDEPENDENT_AMBULATORY_CARE_PROVIDER_SITE_OTHER): Payer: Medicare Other

## 2019-03-13 ENCOUNTER — Other Ambulatory Visit: Payer: Medicare Other

## 2019-03-13 VITALS — BP 140/80 | Ht 76.0 in | Wt 252.0 lb

## 2019-03-13 DIAGNOSIS — E119 Type 2 diabetes mellitus without complications: Secondary | ICD-10-CM

## 2019-03-13 DIAGNOSIS — Z0000 Encounter for general adult medical examination without abnormal findings: Secondary | ICD-10-CM | POA: Diagnosis not present

## 2019-03-13 LAB — COMPREHENSIVE METABOLIC PANEL
ALT: 29 U/L (ref 0–53)
AST: 23 U/L (ref 0–37)
Albumin: 4.3 g/dL (ref 3.5–5.2)
Alkaline Phosphatase: 55 U/L (ref 39–117)
BUN: 15 mg/dL (ref 6–23)
CO2: 30 mEq/L (ref 19–32)
Calcium: 9.4 mg/dL (ref 8.4–10.5)
Chloride: 102 mEq/L (ref 96–112)
Creatinine, Ser: 0.9 mg/dL (ref 0.40–1.50)
GFR: 80.43 mL/min (ref >60.00)
Glucose, Bld: 281 mg/dL — ABNORMAL HIGH (ref 70–99)
Potassium: 4.6 mEq/L (ref 3.5–5.1)
Sodium: 138 mEq/L (ref 135–145)
Total Bilirubin: 0.7 mg/dL (ref 0.2–1.2)
Total Protein: 6.6 g/dL (ref 6.0–8.3)

## 2019-03-13 LAB — CBC WITH DIFFERENTIAL/PLATELET
Basophils Absolute: 0 10*3/uL (ref 0.0–0.1)
Basophils Relative: 0.4 % (ref 0.0–3.0)
Eosinophils Absolute: 0.2 10*3/uL (ref 0.0–0.7)
Eosinophils Relative: 1.9 % (ref 0.0–5.0)
HCT: 45.8 % (ref 39.0–52.0)
Hemoglobin: 15.3 g/dL (ref 13.0–17.0)
Lymphocytes Relative: 29.7 % (ref 12.0–46.0)
Lymphs Abs: 2.5 10*3/uL (ref 0.7–4.0)
MCHC: 33.5 g/dL (ref 30.0–36.0)
MCV: 89.6 fl (ref 78.0–100.0)
Monocytes Absolute: 0.9 10*3/uL (ref 0.1–1.0)
Monocytes Relative: 10.1 % (ref 3.0–12.0)
Neutro Abs: 4.9 10*3/uL (ref 1.4–7.7)
Neutrophils Relative %: 57.9 % (ref 43.0–77.0)
Platelets: 196 10*3/uL (ref 150.0–400.0)
RBC: 5.11 Mil/uL (ref 4.22–5.81)
RDW: 14.1 % (ref 11.5–15.5)
WBC: 8.5 10*3/uL (ref 4.0–10.5)

## 2019-03-13 LAB — LIPID PANEL
Cholesterol: 171 mg/dL (ref 0–200)
HDL: 51.7 mg/dL (ref >39.00)
LDL Cholesterol: 85 mg/dL (ref 0–99)
NonHDL: 118.98
Total CHOL/HDL Ratio: 3
Triglycerides: 169 mg/dL — ABNORMAL HIGH (ref 0.0–149.0)
VLDL: 33.8 mg/dL (ref 0.0–40.0)

## 2019-03-13 LAB — HEMOGLOBIN A1C: Hgb A1c MFr Bld: 9.8 % — ABNORMAL HIGH (ref 4.6–6.5)

## 2019-03-13 NOTE — Patient Instructions (Signed)
Jonathan York , Thank you for taking time to come for your Medicare Wellness Visit. I appreciate your ongoing commitment to your health goals. Please review the following plan we discussed and let me know if I can assist you in the future.   Screening recommendations/referrals: Colonoscopy: not required Recommended yearly ophthalmology/optometry visit for glaucoma screening and checkup Recommended yearly dental visit for hygiene and checkup  Vaccinations: Influenza vaccine: 04/2018 Pneumococcal vaccine: 01/2015 Tdap vaccine: 01/2016 Shingles vaccine: 12/2009    Advanced directives: Please bring a copy of your POA (Power of [place]) and/or Living Will to your next appointment.    Conditions/risks identified: obesity  Next appointment: 03/17/2019 at 10:45  Preventive Care 18 Years and Older, Male Preventive care refers to lifestyle choices and visits with your health care provider that can promote health and wellness. What does preventive care include?  A yearly physical exam. This is also called an annual well check.  Dental exams once or twice a year.  Routine eye exams. Ask your health care provider how often you should have your eyes checked.  Personal lifestyle choices, including:  Daily care of your teeth and gums.  Regular physical activity.  Eating a healthy diet.  Avoiding tobacco and drug use.  Limiting alcohol use.  Practicing safe sex.  Taking low doses of aspirin every day.  Taking vitamin and mineral supplements as recommended by your health care provider. What happens during an annual well check? The services and screenings done by your health care provider during your annual well check will depend on your age, overall health, lifestyle risk factors, and family history of disease. Counseling  Your health care provider may ask you questions about your:  Alcohol use.  Tobacco use.  Drug use.  Emotional well-being.  Home and relationship well-being.   Sexual activity.  Eating habits.  History of falls.  Memory and ability to understand (cognition).  Work and work Statistician. Screening  You may have the following tests or measurements:  Height, weight, and BMI.  Blood pressure.  Lipid and cholesterol levels. These may be checked every 5 years, or more frequently if you are over 44 years old.  Skin check.  Lung cancer screening. You may have this screening every year starting at age 49 if you have a 30-pack-year history of smoking and currently smoke or have quit within the past 15 years.  Fecal occult blood test (FOBT) of the stool. You may have this test every year starting at age 15.  Flexible sigmoidoscopy or colonoscopy. You may have a sigmoidoscopy every 5 years or a colonoscopy every 10 years starting at age 51.  Prostate cancer screening. Recommendations will vary depending on your family history and other risks.  Hepatitis C blood test.  Hepatitis B blood test.  Sexually transmitted disease (STD) testing.  Diabetes screening. This is done by checking your blood sugar (glucose) after you have not eaten for a while (fasting). You may have this done every 1-3 years.  Abdominal aortic aneurysm (AAA) screening. You may need this if you are a current or former smoker.  Osteoporosis. You may be screened starting at age 46 if you are at high risk. Talk with your health care provider about your test results, treatment options, and if necessary, the need for more tests. Vaccines  Your health care provider may recommend certain vaccines, such as:  Influenza vaccine. This is recommended every year.  Tetanus, diphtheria, and acellular pertussis (Tdap, Td) vaccine. You may need a Td booster  every 10 years.  Zoster vaccine. You may need this after age 15.  Pneumococcal 13-valent conjugate (PCV13) vaccine. One dose is recommended after age 105.  Pneumococcal polysaccharide (PPSV23) vaccine. One dose is recommended after  age 44. Talk to your health care provider about which screenings and vaccines you need and how often you need them. This information is not intended to replace advice given to you by your health care provider. Make sure you discuss any questions you have with your health care provider. Document Released: 08/02/2015 Document Revised: 03/25/2016 Document Reviewed: 05/07/2015 Elsevier Interactive Patient Education  2017 [place] Prevention in the Home Falls can cause injuries. They can happen to people of all ages. There are many things you can do to make your home safe and to help prevent falls. What can I do on the outside of my home?  Regularly fix the edges of walkways and driveways and fix any cracks.  Remove anything that might make you trip as you walk through a door, such as a raised step or threshold.  Trim any bushes or trees on the path to your home.  Use bright outdoor lighting.  Clear any walking paths of anything that might make someone trip, such as rocks or tools.  Regularly check to see if handrails are loose or broken. Make sure that both sides of any steps have handrails.  Any raised decks and porches should have guardrails on the edges.  Have any leaves, snow, or ice cleared regularly.  Use sand or salt on walking paths during winter.  Clean up any spills in your garage right away. This includes oil or grease spills. What can I do in the bathroom?  Use night lights.  Install grab bars by the toilet and in the tub and shower. Do not use towel bars as grab bars.  Use non-skid mats or decals in the tub or shower.  If you need to sit down in the shower, use a plastic, non-slip stool.  Keep the floor dry. Clean up any water that spills on the floor as soon as it happens.  Remove soap buildup in the tub or shower regularly.  Attach bath mats securely with double-sided non-slip rug tape.  Do not have throw rugs and other things on the floor that can  make you trip. What can I do in the bedroom?  Use night lights.  Make sure that you have a light by your bed that is easy to reach.  Do not use any sheets or blankets that are too big for your bed. They should not hang down onto the floor.  Have a firm chair that has side arms. You can use this for support while you get dressed.  Do not have throw rugs and other things on the floor that can make you trip. What can I do in the kitchen?  Clean up any spills right away.  Avoid walking on wet floors.  Keep items that you use a lot in easy-to-reach places.  If you need to reach something above you, use a strong step stool that has a grab bar.  Keep electrical cords out of the way.  Do not use floor polish or wax that makes floors slippery. If you must use wax, use non-skid floor wax.  Do not have throw rugs and other things on the floor that can make you trip. What can I do with my stairs?  Do not leave any items on the stairs.  Make  sure that there are handrails on both sides of the stairs and use them. Fix handrails that are broken or loose. Make sure that handrails are as long as the stairways.  Check any carpeting to make sure that it is firmly attached to the stairs. Fix any carpet that is loose or worn.  Avoid having throw rugs at the top or bottom of the stairs. If you do have throw rugs, attach them to the floor with carpet tape.  Make sure that you have a light switch at the top of the stairs and the bottom of the stairs. If you do not have them, ask someone to add them for you. What else can I do to help prevent falls?  Wear shoes that:  Do not have high heels.  Have rubber bottoms.  Are comfortable and fit you well.  Are closed at the toe. Do not wear sandals.  If you use a stepladder:  Make sure that it is fully opened. Do not climb a closed stepladder.  Make sure that both sides of the stepladder are locked into place.  Ask someone to hold it for you,  if possible.  Clearly mark and make sure that you can see:  Any grab bars or handrails.  First and last steps.  Where the edge of each step is.  Use tools that help you move around (mobility aids) if they are needed. These include:  Canes.  Walkers.  Scooters.  Crutches.  Turn on the lights when you go into a dark area. Replace any light bulbs as soon as they burn out.  Set up your furniture so you have a clear path. Avoid moving your furniture around.  If any of your floors are uneven, fix them.  If there are any pets around you, be aware of where they are.  Review your medicines with your doctor. Some medicines can make you feel dizzy. This can increase your chance of falling. Ask your doctor what other things that you can do to help prevent falls. This information is not intended to replace advice given to you by your health care provider. Make sure you discuss any questions you have with your health care provider. Document Released: 05/02/2009 Document Revised: 12/12/2015 Document Reviewed: 08/10/2014 Elsevier Interactive Patient Education  2017 [employer].

## 2019-03-13 NOTE — Progress Notes (Signed)
PCP notes:  Health Maintenance:  No gaps  Abnormal Screenings:  None  Patient concerns:  None  Nurse concerns:  None  Next PCP appt.: 03/17/2019 at 10:45

## 2019-03-13 NOTE — Progress Notes (Signed)
Subjective:   Jonathan York is a 85 y.o. male who presents for Medicare Annual/Subsequent preventive examination.  This visit type was conducted due to national recommendations for restrictions regarding the COVID-19 Pandemic (e.g. social distancing). This format is felt to be most appropriate for this patient at this time. All issues noted in this document were discussed and addressed. No physical exam was performed (except for noted visual exam findings with Video Visits). This patient, Jonathan York, has given permission to perform this visit via telephone. Vital signs may be absent or patient reported.  Patient location:  At home  Nurse location:  At home     Review of Systems:  n/a Cardiac Risk Factors include: advanced age (>61men, >18 women);diabetes mellitus;dyslipidemia;hypertension;obesity (BMI >30kg/m2)     Objective:    Vitals: Ht 6\' 4"  (1.93 m) Comment: per patient  Wt 252 lb (114.3 kg) Comment: per patient  BMI 30.67 kg/m   Body mass index is 30.67 kg/m.  Advanced Directives 03/13/2019 05/12/2018 04/25/2018 04/25/2018 04/18/2018 03/24/2018 02/14/2018  Does Patient Have a Medical Advance Directive? Yes Yes Yes Yes Yes Yes Yes  Type of Paramedic of [place];Living will [place];Living will Healthcare Power of [place] of [place];Living will Living will [place];Living will  Does patient want to make changes to medical advance directive? - - No - Patient declined - - - No - Patient declined  Copy of [place] in Chart? No - copy requested No - copy requested No - copy requested No - copy requested No - copy requested - -  Pre-existing out of facility DNR order (yellow form or pink MOST form) - - - - - - -    Tobacco Social History   Tobacco Use  Smoking Status Former Smoker  . Packs/day: 1.00  . Years: 32.00  . Pack years: 32.00  . Types:  Cigarettes  . Quit date: 09/03/1982  . Years since quitting: 36.5  Smokeless Tobacco Never Used     Counseling given: Not Answered   Clinical Intake:  Pre-visit preparation completed: Yes  Pain : No/denies pain     Nutritional Status: BMI > 30  Obese Nutritional Risks: None Diabetes: Yes CBG done?: No Did pt. bring in CBG monitor from home?: No  How often do you need to have someone help you when you read instructions, pamphlets, or other written materials from your doctor or pharmacy?: 1 - Never What is the last grade level you completed in school?: 12th grade  Interpreter Needed?: No  Information entered by :: NAllen LPN  Past Medical History:  Diagnosis Date  . Abdominal bruit 05/05/2012  . Arthritis    back pain, past lumbar surgery   . BPH (benign prostatic hypertrophy)   . Carotid arterial disease (HCC)    a. s/p left-sided CEA  . Chronic atrial fibrillation    a. CHADS2VASc => 5 (HTN, age x 2, DM, vascular disease); Eliquis  . Diabetes mellitus type II    diet controlled   . HLD (hyperlipidemia)   . HTN (hypertension)   . Lung cancer ([place])    a. s/p resection and chemo in 2002  . TIA (transient ischemic attack) 2019   Past Surgical History:  Procedure Laterality Date  . ARCH AORTOGRAM  05/27/2012   Procedure: ARCH AORTOGRAM;  Surgeon: Elam Dutch, MD;  Location: [facility] CATH LAB;  Service: Cardiovascular;;  . CARDIOVERSION  06/29/2012   Procedure: CARDIOVERSION;  Surgeon: Thayer Headings, MD;  Location: [facility] ENDOSCOPY;  Service: Cardiovascular;  Laterality: N/A;  . CAROTID ANGIOGRAM N/A 05/27/2012   Procedure: CAROTID Cyril Loosen;  Surgeon: Elam Dutch, MD;  Location: [facility] CATH LAB;  Service: Cardiovascular;  Laterality: N/A;  . CAROTID ENDARTERECTOMY Left 05/30/12  . CATARACT EXTRACTION     right  . ENDARTERECTOMY  05/30/2012   Procedure: ENDARTERECTOMY CAROTID;  Surgeon: Elam Dutch, MD;  Location: [place];  Service: Vascular;  Laterality: Left;  .  ENDARTERECTOMY Right 04/25/2018   Procedure: ENDARTERECTOMY RIGHT CAROTID;  Surgeon: Elam Dutch, MD;  Location: [place];  Service: Vascular;  Laterality: Right;  . EYE SURGERY     Jonathan detached retina- 1990's, IOL implants also   . [place]  . JOINT REPLACEMENT     knee- bilateral- 2009 and 2012  . LOBECTOMY  03/15/01   [facility] R[facility] Hockey)  . NOSE SURGERY    . PATCH ANGIOPLASTY Right 04/25/2018   Procedure: PATCH ANGIOPLASTY RIGHT CAROTID ARTERY USING HEMASHILED PLATINUM FINESSE PATCH;  Surgeon: Elam Dutch, MD;  Location: [place];  Service: Vascular;  Laterality: Right;  . RETINAL DETACHMENT SURGERY     right  . SPINE SURGERY  approx 2006   Ruptered Disc  . TONSILLECTOMY     1939  . TOTAL KNEE ARTHROPLASTY     x2 per Dr. Rush Farmer  . WEDGE RESECTION  03/15/01   [facility])   Family History  Problem Relation Age of Onset  . Emphysema Father   . Dementia Mother   . Diabetes Mother   . Hypertension Mother   . Hypothyroidism Sister   . Dementia Brother   . Hypertension Brother   . Diabetes Son   . Heart disease Son        Heart Disease before age 64- Open Heart 2010  . Hypertension Son   . Stroke Son        while on coumadin  . Prostate cancer Neg Hx   . Colon cancer Neg Hx    Social History   Socioeconomic History  . Marital status: Married    Spouse name: Not on file  . Number of children: 3  . Years of education: Not on file  . Highest education level: Not on file  Occupational History  . Occupation: retired-[employer] [place] [employer]  Social Needs  . Financial resource strain: Not hard at all  . Food insecurity    Worry: Never true    Inability: Never true  . Transportation needs    Medical: No    Non-medical: No  Tobacco Use  . Smoking status: Former Smoker    Packs/day: 1.00    Years: 32.00    Pack years: 32.00    Types: Cigarettes    Quit date: 09/03/1982    Years since quitting: 36.5  . Smokeless tobacco: Never Used  Substance  and Sexual Activity  . Alcohol use: No    Alcohol/week: 0.0 standard drinks    Comment:    . Drug use: No  . Sexual activity: Yes  Lifestyle  . Physical activity    Days per week: 7 days    Minutes per session: 20 min  . Stress: Not at all  Relationships  . Social Herbalist on phone: Not on file    Gets together: Not on file    Attends religious service: Not on file    Active member  of club or organization: Not on file    Attends meetings of clubs or organizations: Not on file    Relationship status: Not on file  Other Topics Concern  . Not on file  Social History Narrative   Retired Personal assistant   From [employer]   Married happily since 1964    Outpatient Encounter Medications as of 03/13/2019  Medication Sig  . amLODipine (NORVASC) 5 MG tablet Take 1 tablet by mouth once daily  . atorvastatin (LIPITOR) 80 MG tablet TAKE 1 TABLET BY MOUTH AT BEDTIME  . Cinnamon 500 MG capsule Take 500 mg by mouth daily.  Marland Kitchen ELIQUIS 5 MG TABS tablet Take 1 tablet by mouth twice daily  . fish oil-omega-3 fatty acids 1000 MG capsule Take 1 g by mouth 3 (three) times daily.   . furosemide (LASIX) 20 MG tablet Take 1-2 tablets (20-40 mg total) by mouth daily as needed for fluid.  Marland Kitchen MAGNESIUM PO Take 1 tablet by mouth daily.  . methocarbamol (ROBAXIN) 500 MG tablet TAKE 1 TABLET BY MOUTH AT BEDTIME AS NEEDED FOR  RESTLESS  LEGS  . Multiple Vitamins-Minerals (PRESERVISION AREDS 2 PO) Take 1 Can by mouth 2 (two) times daily.   . ramipril (ALTACE) 10 MG capsule Take 1 capsule (10 mg total) by mouth daily.  . tamsulosin (FLOMAX) 0.4 MG CAPS capsule Take 1 capsule by mouth once daily   No facility-administered encounter medications on file as of 03/13/2019.     Activities of Daily Living In your present state of health, do you have any difficulty performing the following activities: 03/13/2019 04/25/2018  Hearing? N N  Vision? Y N  Comment with reading -  Difficulty concentrating or making  decisions? N N  Walking or climbing stairs? N N  Dressing or bathing? N N  Doing errands, shopping? N N  Preparing Food and eating ? N -  Using the Toilet? N -  In the past six months, have you accidently leaked urine? N -  Do you have problems with loss of bowel control? N -  Managing your Medications? N -  Managing your Finances? N -  Housekeeping or managing your Housekeeping? N -  Some recent data might be hidden    Patient Care Team: Tonia Ghent, MD as PCP - General (Family Medicine) [place], Peter M, MD (Cardiology) Wellington Hampshire, MD as Consulting Physician (Cardiology)   Assessment:   This is a routine wellness examination for Jonathan.  Exercise Activities and Dietary recommendations Current Exercise Habits: Home exercise routine, Type of exercise: stretching;calisthenics, Time (Minutes): 20, Frequency (Times/Week): 7, Weekly Exercise (Minutes/Week): 140  Goals    . Cut out extra servings     Starting 09/10/2015, I will decrease intake of ice cream to one day per week.     . Patient Stated     03/13/2019, lose weight wants to get to 200 pounds       Fall Risk Fall Risk  03/13/2019 03/16/2018 03/11/2018 02/18/2017 02/14/2016  Falls in the past year? 0 No No No No  Risk for fall due to : Medication side effect - - - -  Follow up Falls evaluation completed;Falls prevention discussed - - - -   Is the patient's home free of loose throw rugs in walkways, pet beds, electrical cords, etc?   yes      Grab bars in the bathroom? no      Handrails on the stairs?   n/a  Adequate lighting?   yes  Timed Get Up and Go Performed:  n/a  Depression Screen PHQ 2/9 Scores 03/13/2019 03/11/2018 02/18/2017 02/14/2016  PHQ - 2 Score 0 0 0 0  PHQ- 9 Score 6 - - -    Cognitive Function MMSE - Mini Mental State Exam 03/13/2019 09/10/2015  Orientation to time 4 5  Orientation to Place 5 5  Registration 3 3  Attention/ Calculation 5 5  Recall 3 3  Language- name 2 objects 0 -  Language-  repeat 1 1  Language- follow 3 step command 0 3  Language- read & follow direction 0 1  Write a sentence 0 -  Copy design 0 -  Total score 21 -   Mini Cog  Mini-Cog screen was completed. Maximum score is 22. A value of 0 denotes this part of the MMSE was not completed or the patient failed this part of the Mini-Cog screening.       Immunization History  Administered Date(s) Administered  . Influenza Split 05/27/2011, 04/27/2012  . Influenza Whole 05/20/2004, 04/20/2007, 04/09/2008, 04/30/2009, 04/08/2010  . Influenza, High Dose Seasonal PF 05/12/2018  . Influenza,inj,Quad PF,6+ Mos 03/29/2013, 04/05/2014, 04/26/2015, 04/24/2016, 05/04/2017  . Pneumococcal Conjugate-13 02/11/2015  . Pneumococcal Polysaccharide-23 03/09/2002, 03/03/2008  . Td 09/14/2002, 02/14/2016  . Zoster 01/02/2010, 01/04/2010    Qualifies for Shingles Vaccine? yes  Screening Tests Health Maintenance  Topic Date Due  . INFLUENZA VACCINE  02/18/2019  . HEMOGLOBIN A1C  05/05/2019  . FOOT EXAM  11/03/2019  . OPHTHALMOLOGY EXAM  11/11/2019  . TETANUS/TDAP  02/13/2026  . PNA vac Low Risk Adult  Completed   Cancer Screenings: Lung: Low Dose CT Chest recommended if Age 51-80 years, 30 pack-year currently smoking OR have quit w/in 15years. Patient does not qualify. Colorectal: not required  Additional Screenings:  Hepatitis C Screening:n/a      Plan:   Patient wants to get to 200 pounds  I have personally reviewed and noted the following in the patient's chart:   . Medical and social history . Use of alcohol, tobacco or illicit drugs  . Current medications and supplements . Functional ability and status . Nutritional status . Physical activity . Advanced directives . List of other physicians . Hospitalizations, surgeries, and ER visits in previous 12 months . Vitals . Screenings to include cognitive, depression, and falls . Referrals and appointments  In addition, I have reviewed and  discussed with patient certain preventive protocols, quality metrics, and best practice recommendations. A written personalized care plan for preventive services as well as general preventive health recommendations were provided to patient.     Kellie Simmering, LPN  1/95/0932

## 2019-03-17 ENCOUNTER — Ambulatory Visit (INDEPENDENT_AMBULATORY_CARE_PROVIDER_SITE_OTHER): Payer: Medicare Other | Admitting: Family Medicine

## 2019-03-17 ENCOUNTER — Ambulatory Visit (INDEPENDENT_AMBULATORY_CARE_PROVIDER_SITE_OTHER)
Admission: RE | Admit: 2019-03-17 | Discharge: 2019-03-17 | Disposition: A | Discharge status: Home / Self Care | Payer: Medicare Other | Source: Ambulatory Visit | Attending: Family Medicine | Admitting: Family Medicine

## 2019-03-17 ENCOUNTER — Other Ambulatory Visit: Payer: Self-pay

## 2019-03-17 ENCOUNTER — Encounter: Payer: Self-pay | Admitting: Family Medicine

## 2019-03-17 VITALS — BP 142/98 | HR 84 | Temp 97.8°F | Ht 76.0 in | Wt 243.4 lb

## 2019-03-17 DIAGNOSIS — E119 Type 2 diabetes mellitus without complications: Secondary | ICD-10-CM

## 2019-03-17 DIAGNOSIS — R0602 Shortness of breath: Secondary | ICD-10-CM

## 2019-03-17 DIAGNOSIS — E785 Hyperlipidemia, unspecified: Secondary | ICD-10-CM

## 2019-03-17 DIAGNOSIS — I4891 Unspecified atrial fibrillation: Secondary | ICD-10-CM

## 2019-03-17 DIAGNOSIS — Z7189 Other specified counseling: Secondary | ICD-10-CM

## 2019-03-17 DIAGNOSIS — C3490 Malignant neoplasm of unspecified part of unspecified bronchus or lung: Secondary | ICD-10-CM | POA: Diagnosis not present

## 2019-03-17 DIAGNOSIS — Z0000 Encounter for general adult medical examination without abnormal findings: Secondary | ICD-10-CM

## 2019-03-17 DIAGNOSIS — I10 Essential (primary) hypertension: Secondary | ICD-10-CM

## 2019-03-17 MED ORDER — METFORMIN HCL 500 MG PO TABS: 500.0000 mg | ORAL_TABLET | Freq: Two times a day (BID) | ORAL | 3 refills | Status: DC

## 2019-03-17 NOTE — Patient Instructions (Addendum)
Check with your insurance to see if they will cover the shingrix shot. Flu shot this fall.   Go to the lab on the way out.  We'll contact you with your xray report.  Stop cinnamon.  Start taking metformin once a day.  If tolerated, then take twice a day.  Update me about your sugar in about 10-14 days.  Stop the cookies and ice cream.  Call about a foot clinic appointment.  Recheck a1c in about 3 months at a visit.  Take care.  Glad to see you.

## 2019-03-17 NOTE — Progress Notes (Signed)
Diabetes:  No meds.  Hypoglycemic episodes: no Hyperglycemic episodes: no Feet problems: no Blood Sugars averaging: no eye exam within last year:yes A1c d/w pt.  Up from prev.  He had been eating ice cream and cookies.  He just cut that out.    Elevated Cholesterol: Using medications without problems: yes Muscle aches: no Diet compliance: encouraged.  Exercise:encouraged  Hypertension:    Using medication without problems or lightheadedness: yes Chest pain with exertion:no Edema: some BLE edema.  Stable recently.  Better with lasix use.  Short of breath: he is getting more SOB with exertion.  Some possible wheeze occ noted by patient.   Lung cancer hx d/w pt. follow-up chest x-ray pending given shortness of breath noted above.  AF.  Still anticoagulated.  No ADE on med.  No bleeding.  No CP.   Wife had a CVA and he is caring for her.  She has had mult appointments recently.   Flu yearly, dw pt.  He'll get in October.  D/w pt.   Shingles 2011.   PNA UTD Tetanus 2017 Colon not due, d/w pt.  Prostate cancer screening and PSA options (with potential risks and benefits of testing vs not testing) were discussed along with recent recs/guidelines. He declined testing PSA at this point.  Advance directive- wife or middle son Dellis Filbert designated if patient were incapacitated.   PMH and SH reviewed  Meds, vitals, and allergies reviewed.   ROS: Per HPI unless specifically indicated in ROS section   GEN: nad, alert and oriented HEENT: ncat NECK: supple w/o LA CV: IRR but not tachy PULM: ctab, no inc wob ABD: soft, +bs EXT: trace BLE edema SKIN: no acute rash  Diabetic foot exam: Normal inspection No skin breakdown No calluses  Normal DP pulses Normal sensation to light touch and monofilament Nails elongated on R foot.

## 2019-03-19 DIAGNOSIS — Z0000 Encounter for general adult medical examination without abnormal findings: Secondary | ICD-10-CM | POA: Insufficient documentation

## 2019-03-19 NOTE — Assessment & Plan Note (Signed)
He had been eating ice cream and cookies.  He just cut that out.   Discussed labs, diet, metformin.  Routine cautions given.  Start with 1 a day and then gradually increase to twice a day and update me about his sugar.  He agrees.  See after visit summary.  Foot care discussed with patient.

## 2019-03-19 NOTE — Assessment & Plan Note (Signed)
  Flu yearly, dw pt.  He'll get in October.  D/w pt.   Shingles 2011.   PNA UTD Tetanus 2017 Colon not due, d/w pt.  Prostate cancer screening and PSA options (with potential risks and benefits of testing vs not testing) were discussed along with recent recs/guidelines. He declined testing PSA at this point.  Advance directive- wife or middle son Dellis Filbert designated if patient were incapacitated.

## 2019-03-19 NOTE — Assessment & Plan Note (Signed)
Lung cancer hx d/w pt. follow-up chest x-ray pending given shortness of breath noted above.

## 2019-03-19 NOTE — Assessment & Plan Note (Signed)
No change in meds at this point.  See notes on x-ray given shortness of breath on exertion.  Lungs are clear at time of exam.  Labs discussed with patient.

## 2019-03-19 NOTE — Assessment & Plan Note (Signed)
Irregular on exam but not tachy.  Still okay for outpatient follow-up.  Continue anticoagulation.  He agrees. >25 minutes spent in face to face time with patient, >50% spent in counselling or coordination of care.

## 2019-03-19 NOTE — Assessment & Plan Note (Signed)
Lung cancer hx d/w pt. follow-up chest x-ray pending given shortness of breath noted above.  See imaging results.

## 2019-03-19 NOTE — Assessment & Plan Note (Signed)
Continue statin.  Needs work on diet.  Discussed.

## 2019-03-19 NOTE — Assessment & Plan Note (Signed)
Advance directive- wife or middle son Dellis Filbert designated if patient were incapacitated.

## 2019-03-21 ENCOUNTER — Other Ambulatory Visit: Payer: Self-pay | Admitting: Family Medicine

## 2019-03-21 DIAGNOSIS — R0602 Shortness of breath: Secondary | ICD-10-CM

## 2019-03-21 NOTE — Progress Notes (Signed)
[facility] Consultation      Assessment and Plan:  Dyspnea on exertion, multifactorial secondary to issues below.  Emphysema. - Severe emphysematous changes seen in the lung apices on CT chest. - We will start Trelegy inhaler.  Will check alpha-1 cheek swab. - PCV 13 02/11/2015; PPSV 03/03/2008.  Patient declined flu shot today, he gets it with his wife together every year.  Deconditioning. - Patient was exercising daily until COVID-19 restrictions came. - Encouraged to increase physical activity, recommend walking 20 minutes/day.  Lung cancer status post lobectomy. -Patient likely has some degree of loss of lung function due to previous left lobectomy. - Currently his cancer is stable, undergoing surveillance scanning.  Lung nodule. - Right upper lobe lung nodule, this appears stable with low PET avidity.  Suspect benign.  Orders Placed This Encounter  Procedures  . Pulmonary Function Test [facility] Only   Meds ordered this encounter  Medications  . Fluticasone-Umeclidin-Vilant (TRELEGY ELLIPTA) 100-62.5-25 MCG/INH AEPB    Sig: Inhale 1 applicator into the lungs daily. Rinse mouth after use.    Dispense:  1 each    Refill:  10  . Fluticasone-Umeclidin-Vilant (TRELEGY ELLIPTA) 100-62.5-25 MCG/INH AEPB    Sig: Inhale 1 puff into the lungs daily for 1 day.    Dispense:  14 each    Refill:  0    Order Specific Question:   Lot Number?    Answer:   [id number]    Order Specific Question:   Manufacturer?    Answer:   [employer] [[id number]]    Order Specific Question:   Quantity    Answer:   1   Return in about 3 months (around 06/21/2019).   Date: 03/22/2019  MRN# [id number] R Abdulkarim Eberlin [birth year]   R Kade Demicco is a 85 y.o. old male seen in consultation for chief complaint of:    Chief Complaint  Patient presents with  . pulmonary consult    per Dr. Damita Dunnings- hx of lung cancer. pt reports of sob with exertion and bending and occ non prod cough.    HPI:  R Benjamen Koelling is a 85 y.o. old male his history includes lung cancer status post left lobectomy. He has noticed that he has been having some progressive dyspnea on exertion. He walks about 12 years to pick up the paper down the driveway, then walks back and notes that he has some dyspnea.  He can walk a [employer] without a problem, but he gets winded with bending over and exerting himself. He used to go to the gym 5 times per week, and bowl twice per week which would cause some dyspnea. Since covid he is no longer doing those things.  He is not doing any exercise, he used to work in [employer], setting type. He stopped about 15 years ago.  He stopped smoking in 1984, he was smoking 1 ppd.  His parents smoked.  He has 1 dog, stays in a crate in the bedroom.  He denies reflux.   He takes no inhalers, he does not like them because he can not use them properly.   **Desat walk 03/22/2019>> baseline at rest on RA, 96% and HR 73 He walked 360 feet at moderate pace, sat was 95% and HR 111, heavy dyspnea. After sitting for 1 minute sat was 95% and HR 95.  **CT chest 09/12/2018>> images personally viewed, there is an irregular mass in the right upper lobe apex, there is a significant biapical emphysema.  In the left lung there is a broad-based pleural-based mass with pleural thickening which appears contiguous with the fissure.  There are small pretracheal lymph nodes, mild hilar lymphadenopathy.  In comparison with previous image from 02/24/2018 the right upper lobe mass appears questionably slightly larger, per radiologist interpretation it is the same size.  PET CT 03/05/2019>> images personally reviewed, similar findings as above, right apical lung mass is non-hypermetabolic.   PMHX:   Past Medical History:  Diagnosis Date  . Abdominal bruit 05/05/2012  . Arthritis    back pain, past lumbar surgery   . BPH (benign prostatic hypertrophy)   . Carotid arterial disease (HCC)    a. s/p left-sided CEA  . Chronic atrial  fibrillation    a. CHADS2VASc => 5 (HTN, age x 2, DM, vascular disease); Eliquis  . Diabetes mellitus type II    diet controlled   . HLD (hyperlipidemia)   . HTN (hypertension)   . Lung cancer ([place])    a. s/p resection and chemo in 2002  . TIA (transient ischemic attack) 2019   Surgical Hx:  Past Surgical History:  Procedure Laterality Date  . ARCH AORTOGRAM  05/27/2012   Procedure: ARCH AORTOGRAM;  Surgeon: Elam Dutch, MD;  Location[facility] C[facility] LAB;  Service: Cardiovascular;;  . CARDIOVERSION  06/29/2012   Procedure: CARDIOVERSION;  Surgeon: Thayer Headings, MD;  Location: [facility] ENDOSCOPY;  Service: Cardiovascular;  Laterality: N/A;  . CAROTID ANGIOGRAM N/A 05/27/2012   Procedure: CAROTID Cyril Loosen;  Surgeon: Elam Dutch, MD;  Location: [facility] CATH LAB;  Service: Cardiovascular;  Laterality: N/A;  . CAROTID ENDARTERECTOMY Left 05/30/12  . CATARACT EXTRACTION     right  . ENDARTERECTOMY  05/30/2012   Procedure: ENDARTERECTOMY CAROTID;  Surgeon: Elam Dutch, MD;  Location: [place];  Service: Vascular;  Laterality: Left;  . ENDARTERECTOMY Right 04/25/2018   Procedure: ENDARTERECTOMY RIGHT CAROTID;  Surgeon: Elam Dutch, MD;  Location: [place];  Service: Vascular;  Laterality: Right;  . EYE SURGERY     R detached retina- 1990's, IOL implants also   . [place]  . JOINT REPLACEMENT     knee- bilateral- 2009 and 2012  . LOBECTOMY  03/15/01   LUL Roxan Hockey)  . NOSE SURGERY    . PATCH ANGIOPLASTY Right 04/25/2018   Procedure: PATCH ANGIOPLASTY RIGHT CAROTID ARTERY USING HEMASHILED PLATINUM FINESSE PATCH;  Surgeon: Elam Dutch, MD;  Location: [place];  Service: Vascular;  Laterality: Right;  . RETINAL DETACHMENT SURGERY     right  . SPINE SURGERY  approx 2006   Ruptered Disc  . TONSILLECTOMY     1939  . TOTAL KNEE ARTHROPLASTY     x2 per Dr. Rush Farmer  . WEDGE RESECTION  03/15/01   LUL Roxan Hockey)   Family Hx:  Family History  Problem Relation Age of  Onset  . Emphysema Father   . Dementia Mother   . Diabetes Mother   . Hypertension Mother   . Hypothyroidism Sister   . Dementia Brother   . Hypertension Brother   . Diabetes Son   . Heart disease Son        Heart Disease before age 49- Open Heart 2010  . Hypertension Son   . Stroke Son        while on coumadin  . Prostate cancer Neg Hx   . Colon cancer Neg Hx    Social Hx:   Social History   Tobacco Use  .  Smoking status: Former Smoker    Packs/day: 1.00    Years: 32.00    Pack years: 32.00    Types: Cigarettes    Quit date: 09/03/1982    Years since quitting: 36.5  . Smokeless tobacco: Never Used  Substance Use Topics  . Alcohol use: No    Alcohol/week: 0.0 standard drinks    Comment:    . Drug use: No   Medication:    Current Outpatient Medications:  .  amLODipine (NORVASC) 5 MG tablet, Take 1 tablet by mouth once daily, Disp: 90 tablet, Rfl: 1 .  atorvastatin (LIPITOR) 80 MG tablet, TAKE 1 TABLET BY MOUTH AT BEDTIME, Disp: 90 tablet, Rfl: 3 .  ELIQUIS 5 MG TABS tablet, Take 1 tablet by mouth twice daily, Disp: 60 tablet, Rfl: 5 .  fish oil-omega-3 fatty acids 1000 MG capsule, Take 1 g by mouth 3 (three) times daily. , Disp: , Rfl:  .  furosemide (LASIX) 20 MG tablet, Take 1-2 tablets (20-40 mg total) by mouth daily as needed for fluid., Disp: 60 tablet, Rfl: 2 .  MAGNESIUM PO, Take 1 tablet by mouth daily., Disp: , Rfl:  .  metFORMIN (GLUCOPHAGE) 500 MG tablet, Take 1 tablet (500 mg total) by mouth 2 (two) times daily with a meal., Disp: 180 tablet, Rfl: 3 .  methocarbamol (ROBAXIN) 500 MG tablet, TAKE 1 TABLET BY MOUTH AT BEDTIME AS NEEDED FOR  RESTLESS  LEGS, Disp: 50 tablet, Rfl: 0 .  Multiple Vitamins-Minerals (PRESERVISION AREDS 2 PO), Take 1 Can by mouth 2 (two) times daily. , Disp: , Rfl:  .  ramipril (ALTACE) 10 MG capsule, Take 1 capsule (10 mg total) by mouth daily., Disp: 90 capsule, Rfl: 3 .  tamsulosin (FLOMAX) 0.4 MG CAPS capsule, Take 1 capsule by  mouth once daily, Disp: 90 capsule, Rfl: 1   Allergies:  Xarelto [rivaroxaban]  Review of Systems: Gen:  Denies  fever, sweats, chills HEENT: Denies blurred vision, double vision. bleeds, sore throat Cvc:  No dizziness, chest pain. Resp:   Denies cough or sputum production, shortness of breath Gi: Denies swallowing difficulty, stomach pain. Gu:  Denies bladder incontinence, burning urine Ext:   No Joint pain, stiffness. Skin: No skin rash,  hives  Endoc:  No polyuria, polydipsia. Psych: No depression, insomnia. Other:  All other systems were reviewed with the patient and were negative other that what is mentioned in the HPI.   Physical Examination:   VS: BP 140/82 (BP Location: Left Arm, Cuff Size: Normal)   Pulse 90   Temp (!) 97.4 F (36.3 C) (Temporal)   Ht 6\' 4"  (1.93 m)   Wt 245 lb (111.1 kg)   SpO2 98%   BMI 29.82 kg/m   General Appearance: No distress  Neuro:without focal findings,  speech normal,  HEENT: PERRLA, EOM intact.   Pulmonary: normal breath sounds, No wheezing.  CardiovascularNormal S1,S2.  No m/r/g.   Abdomen: Benign, Soft, non-tender. Renal:  No costovertebral tenderness  GU:  No performed at this time. Endoc: No evident thyromegaly, no signs of acromegaly. Skin:   warm, no rashes, no ecchymosis  Extremities: normal, no cyanosis, clubbing.  Other findings:    LABORATORY PANEL:   CBC No results for input(s): WBC, HGB, HCT, PLT in the last 168 hours. ------------------------------------------------------------------------------------------------------------------  Chemistries  No results for input(s): NA, K, CL, CO2, GLUCOSE, BUN, CREATININE, CALCIUM, MG, AST, ALT, ALKPHOS, BILITOT in the last 168 hours.  Invalid input(s): GFRCGP ------------------------------------------------------------------------------------------------------------------  Cardiac Enzymes  No results for input(s): TROPONINI in the last 168 hours.  ------------------------------------------------------------  RADIOLOGY:  No results found.     Thank  you for the consultation and for allowing [facility] to assist in the care of your patient. Our recommendations are noted above.  Please contact us if we can be of further service.   Marda Stalker, M.D., F.C.C.P.  Board Certified in Internal Medicine, Pulmonary Medicine, [place], and Sleep Medicine.  [facility] Office Number: [phone]   03/22/2019

## 2019-03-22 ENCOUNTER — Encounter: Payer: Self-pay | Admitting: Internal Medicine

## 2019-03-22 ENCOUNTER — Ambulatory Visit: Payer: Medicare Other | Admitting: Internal Medicine

## 2019-03-22 ENCOUNTER — Other Ambulatory Visit: Payer: Self-pay

## 2019-03-22 VITALS — BP 140/82 | HR 90 | Temp 97.4°F | Ht 76.0 in | Wt 245.0 lb

## 2019-03-22 DIAGNOSIS — J449 Chronic obstructive pulmonary disease, unspecified: Secondary | ICD-10-CM

## 2019-03-22 MED ORDER — TRELEGY ELLIPTA 100-62.5-25 MCG/INH IN AEPB: 1.0000 | INHALATION_SPRAY | Freq: Every day | RESPIRATORY_TRACT | 10 refills | Status: DC

## 2019-03-22 MED ORDER — TRELEGY ELLIPTA 100-62.5-25 MCG/INH IN AEPB: 1.0000 | INHALATION_SPRAY | Freq: Every day | RESPIRATORY_TRACT | 0 refills | Status: AC

## 2019-03-22 NOTE — Patient Instructions (Signed)
Start inhaler once daily, rinse mouth after use.  If it is too expensive let us know and we can start a different one. Try to start increasing her activity level, by walking 20 minutes/day.

## 2019-03-23 ENCOUNTER — Other Ambulatory Visit: Payer: Self-pay

## 2019-03-23 NOTE — Patient Outreach (Signed)
[place] [facility]) Care Management  03/23/2019  R Greely Atiyeh [birth year] [id number]  Medication Adherence call to Mr. Osman Calzadilla [facility] Identifiers Verify spoke with patient he is past due on Atorvastatin 80 mg patient explain he takes 1 tablet daily sometimes he forgets to take it,patient has enough for ninety days patient was a bit upset he keeps receiving calls. Mr. Arreola is showing past due under [place].   [place] Management Direct Dial [phone]  Fax [phone] [email or web address]

## 2019-04-03 ENCOUNTER — Other Ambulatory Visit: Payer: Self-pay

## 2019-04-03 ENCOUNTER — Ambulatory Visit (INDEPENDENT_AMBULATORY_CARE_PROVIDER_SITE_OTHER): Payer: Medicare Other | Admitting: Podiatry

## 2019-04-03 ENCOUNTER — Encounter: Payer: Self-pay | Admitting: Podiatry

## 2019-04-03 DIAGNOSIS — E119 Type 2 diabetes mellitus without complications: Secondary | ICD-10-CM

## 2019-04-03 DIAGNOSIS — D689 Coagulation defect, unspecified: Secondary | ICD-10-CM | POA: Diagnosis not present

## 2019-04-03 DIAGNOSIS — L603 Nail dystrophy: Secondary | ICD-10-CM | POA: Diagnosis not present

## 2019-04-03 NOTE — Progress Notes (Signed)
Complaint:  Visit Type: Patient returns to my office for continued preventative foot care services. Complaint: Patient states" my nails have grown long and thick and become painful to walk and wear shoes".  He says his great toenail and second toenails are thick and curved and painful. Patient has been diagnosed with DM with no foot complications. The patient presents for preventative foot care services. No changes to ROS.  Patient is taking eliquiss.  Podiatric Exam: Vascular: dorsalis pedis and posterior tibial pulses are palpable bilateral. Capillary return is immediate. Temperature gradient is WNL. Skin turgor WNL  Sensorium: Normal Semmes Weinstein monofilament test. Normal tactile sensation bilaterally. Nail Exam: Pt has thick disfigured discolored nails with subungual debris noted bilateral entire nail hallux through fifth toenails Ulcer Exam: There is no evidence of ulcer or pre-ulcerative changes or infection. Orthopedic Exam: Muscle tone and strength are WNL. No limitations in general ROM. No crepitus or effusions noted. Foot type and digits show no abnormalities. Bony prominences are unremarkable. Skin: No Porokeratosis. No infection or ulcers  Diagnosis:  Onychomycosis, , Pain in right toe  Treatment & Plan Procedures and Treatment: Consent by patient was obtained for treatment procedures.   Debridement of mycotic and hypertrophic toenails, 1 through 2 right foot. and clearing of subungual debris. No ulceration, no infection noted.  Return Visit-Office Procedure: Patient instructed to return to the office for a follow up visit 3 months for continued evaluation and treatment.    Gardiner Barefoot DPM

## 2019-04-05 ENCOUNTER — Other Ambulatory Visit: Payer: Self-pay | Admitting: Family Medicine

## 2019-04-18 ENCOUNTER — Ambulatory Visit (INDEPENDENT_AMBULATORY_CARE_PROVIDER_SITE_OTHER): Payer: Medicare Other

## 2019-04-18 ENCOUNTER — Telehealth: Payer: Self-pay | Admitting: Pulmonary Disease

## 2019-04-18 DIAGNOSIS — Z23 Encounter for immunization: Secondary | ICD-10-CM | POA: Diagnosis not present

## 2019-04-18 NOTE — Telephone Encounter (Signed)
Per LG- A1AT was normal.  Pt is aware and voiced his understanding.  Nothing further is needed.

## 2019-04-25 ENCOUNTER — Ambulatory Visit (INDEPENDENT_AMBULATORY_CARE_PROVIDER_SITE_OTHER): Payer: Medicare Other | Admitting: Family Medicine

## 2019-04-25 ENCOUNTER — Other Ambulatory Visit: Payer: Self-pay

## 2019-04-25 ENCOUNTER — Encounter: Payer: Self-pay | Admitting: Family Medicine

## 2019-04-25 VITALS — BP 138/72 | HR 67 | Temp 97.3°F | Resp 20 | Ht 76.0 in | Wt 246.2 lb

## 2019-04-25 DIAGNOSIS — H6120 Impacted cerumen, unspecified ear: Secondary | ICD-10-CM

## 2019-04-25 NOTE — Progress Notes (Signed)
R ear sx.  Less so on L ear.  Stopped up and stuffy.  Dec in hearing.  No FCNAVD.   Sugar 124 on recent check.  Compliant with metformin.  He is working on diet.  Not yet due for repeat A1c.  See after visit summary.  Meds, vitals, and allergies reviewed.   ROS: Per HPI unless specifically indicated in ROS section   No apparent distress Bilateral impacted cerumen removed with irrigation and curette. Recheck canals with residual irritation likely related to previously adherent wax but no sign of infection. Tympanic membranes within normal limits bilaterally.

## 2019-04-25 NOTE — Patient Instructions (Addendum)
Recheck in 06/2019 about your sugar.  A1c at the visit, no need to fast.   Update me as needed.   Okay to try debrox if needed prior to irrigation in the shower. Take care.  Glad to see you.

## 2019-04-26 NOTE — Assessment & Plan Note (Signed)
Bilateral.  Resolved with irrigation and curette.  Was impacted.  Routine cautions and instructions given to patient.  He felt better.  He can hear better after removal.  He will update me as needed.

## 2019-05-11 ENCOUNTER — Other Ambulatory Visit: Payer: Self-pay

## 2019-05-11 ENCOUNTER — Ambulatory Visit (INDEPENDENT_AMBULATORY_CARE_PROVIDER_SITE_OTHER): Payer: Medicare Other | Admitting: Cardiovascular Disease

## 2019-05-11 ENCOUNTER — Encounter: Payer: Self-pay | Admitting: Cardiovascular Disease

## 2019-05-11 VITALS — BP 160/70 | HR 65 | Temp 97.1°F | Ht 76.0 in | Wt 248.0 lb

## 2019-05-11 DIAGNOSIS — I5032 Chronic diastolic (congestive) heart failure: Secondary | ICD-10-CM

## 2019-05-11 DIAGNOSIS — I10 Essential (primary) hypertension: Secondary | ICD-10-CM

## 2019-05-11 DIAGNOSIS — I4821 Permanent atrial fibrillation: Secondary | ICD-10-CM

## 2019-05-11 DIAGNOSIS — I779 Disorder of arteries and arterioles, unspecified: Secondary | ICD-10-CM | POA: Diagnosis not present

## 2019-05-11 NOTE — Progress Notes (Signed)
Cardiology Office Note   Date:  05/11/2019   ID:  Jonathan York, DOB 1933/02/21, MRN [id number]  PCP:  Tonia Ghent, MD  Cardiologist:   Kathlyn Sacramento, MD   Chief Complaint  Patient presents with  . other    6 month follow up. Meds reviewed by the pt. verbally. Pt. c/o shortness of breath and LE edema.       History of Present Illness: Jonathan York is a 85 y.o. male who presents regarding chronic atrial fibrillation. He has chronic medical conditions that include hypertension, hyperlipidemia, type 2 diabetes and carotid disease status post bilateral carotid endarterectomy. He has lung cancer status post resection and chemotherapy in 2002. He had a syncopal episode in the setting of choking on one of his medications. It was felt to be vasovagal.  He had mild volume overload in early 2020 that responded to small dose furosemide.  Echocardiogram in April showed an EF of 60 to 65% with indeterminate diastolic function and mild pulmonary hypertension.  He has not had issues with that since then and he has not used furosemide since August.  He is feeling well with no chest pain or palpitations.  He reports stable exertional dyspnea.  He has been having financial difficulties affording Eliquis and as a result he has been taking the medication once daily instead of twice daily.    Past Medical History:  Diagnosis Date  . Abdominal bruit 05/05/2012  . Arthritis    back pain, past lumbar surgery   . BPH (benign prostatic hypertrophy)   . Carotid arterial disease (HCC)    a. s/p left-sided CEA  . Chronic atrial fibrillation ([place])    a. CHADS2VASc => 5 (HTN, age x 2, DM, vascular disease); Eliquis  . Diabetes mellitus type II    diet controlled   . HLD (hyperlipidemia)   . HTN (hypertension)   . Lung cancer ([place])    a. s/p resection and chemo in 2002  . TIA (transient ischemic attack) 2019    Past Surgical History:  Procedure Laterality Date  . ARCH AORTOGRAM  05/27/2012   Procedure: ARCH AORTOGRAM;  Surgeon: Elam Dutch, MD;  Location: [facility] CATH LAB;  Service: Cardiovascular;;  . CARDIOVERSION  06/29/2012   Procedure: CARDIOVERSION;  Surgeon: Thayer Headings, MD;  Location: [facility] ENDOSCOPY;  Service: Cardiovascular;  Laterality: N/A;  . CAROTID ANGIOGRAM N/A 05/27/2012   Procedure: CAROTID Cyril Loosen;  Surgeon: Elam Dutch, MD;  Location: [facility] CATH LAB;  Service: Cardiovascular;  Laterality: N/A;  . CAROTID ENDARTERECTOMY Left 05/30/12  . CATARACT EXTRACTION     right  . ENDARTERECTOMY  05/30/2012   Procedure: ENDARTERECTOMY CAROTID;  Surgeon: Elam Dutch, MD;  Location: [place];  Service: Vascular;  Laterality: Left;  . ENDARTERECTOMY Right 04/25/2018   Procedure: ENDARTERECTOMY RIGHT CAROTID;  Surgeon: Elam Dutch, MD;  Location: [place];  Service: Vascular;  Laterality: Right;  . EYE SURGERY     Jonathan detached retina- 1990's, IOL implants also   . [place]  . JOINT REPLACEMENT     knee- bilateral- 2009 and 2012  . LOBECTOMY  03/15/01   LUL Roxan Hockey)  . NOSE SURGERY    . PATCH ANGIOPLASTY Right 04/25/2018   Procedure: PATCH ANGIOPLASTY RIGHT CAROTID ARTERY USING HEMASHILED PLATINUM FINESSE PATCH;  Surgeon: Elam Dutch, MD;  Location: [place];  Service: Vascular;  Laterality: Right;  . RETINAL DETACHMENT SURGERY     right  .  SPINE SURGERY  approx 2006   Ruptered Disc  . TONSILLECTOMY     1939  . TOTAL KNEE ARTHROPLASTY     x2 per Dr. Rush Farmer  . WEDGE RESECTION  03/15/01   LUL Roxan Hockey)     Current Outpatient Medications  Medication Sig Dispense Refill  . amLODipine (NORVASC) 5 MG tablet Take 1 tablet by mouth once daily 90 tablet 1  . atorvastatin (LIPITOR) 80 MG tablet TAKE 1 TABLET BY MOUTH AT BEDTIME 90 tablet 3  . ELIQUIS 5 MG TABS tablet Take 1 tablet by mouth twice daily 60 tablet 5  . fish oil-omega-3 fatty acids 1000 MG capsule Take 1 g by mouth 3 (three) times daily.     . Fluticasone-Umeclidin-Vilant  (TRELEGY ELLIPTA) 100-62.5-25 MCG/INH AEPB Inhale 1 applicator into the lungs daily. Rinse mouth after use. 1 each 10  . furosemide (LASIX) 20 MG tablet Take 1-2 tablets (20-40 mg total) by mouth daily as needed for fluid. 60 tablet 2  . metFORMIN (GLUCOPHAGE) 500 MG tablet Take 1 tablet (500 mg total) by mouth 2 (two) times daily with a meal. 180 tablet 3  . methocarbamol (ROBAXIN) 500 MG tablet TAKE 1 TABLET BY MOUTH AT BEDTIME AS NEEDED FOR  RESTLESS  LEGS 50 tablet 0  . Multiple Vitamins-Minerals (PRESERVISION AREDS 2 PO) Take 1 Can by mouth 2 (two) times daily.     . ramipril (ALTACE) 10 MG capsule Take 1 capsule (10 mg total) by mouth daily. 90 capsule 3  . tamsulosin (FLOMAX) 0.4 MG CAPS capsule Take 1 capsule by mouth once daily 90 capsule 1   No current facility-administered medications for this visit.     Allergies:   Xarelto [rivaroxaban]    Social History:  The patient  reports that he quit smoking about 36 years ago. His smoking use included cigarettes. He has a 32.00 pack-year smoking history. He has never used smokeless tobacco. He reports that he does not drink alcohol or use drugs.   Family History:  The patient's family history includes Dementia in his brother and mother; Diabetes in his mother and son; Emphysema in his father; Heart disease in his son; Hypertension in his brother, mother, and son; Hypothyroidism in his sister; Stroke in his son.    ROS:  Please see the history of present illness.   Otherwise, review of systems are positive for none.   All other systems are reviewed and negative.    PHYSICAL EXAM: VS:  BP (!) 160/70 (BP Location: Left Arm, Patient Position: Sitting, Cuff Size: Normal)   Pulse 65   Temp (!) 97.1 F (36.2 C)   Ht 6\' 4"  (1.93 m)   Wt 248 lb (112.5 kg)   BMI 30.19 kg/m  , BMI Body mass index is 30.19 kg/m. GEN: Well nourished, well developed, in no acute distress  HEENT: normal  Neck: no JVD, carotid bruits, or masses Cardiac:  Irregularly irregular; no murmurs, rubs, or gallops, trace edema  Respiratory:  clear to auscultation bilaterally, normal work of breathing GI: soft, nontender, nondistended, + BS MS: no deformity or atrophy  Skin: warm and dry, no rash Neuro:  Strength and sensation are intact Psych: euthymic mood, full affect   EKG:  EKG is ordered today. The ekg ordered today demonstrates atrial fibrillation with possible old septal and inferior infarct.   Recent Labs: 11/23/2018: Pro B Natriuretic peptide (BNP) 177.0 03/13/2019: ALT 29; BUN 15; Creatinine, Ser 0.90; Hemoglobin 15.3; Platelets 196.0; Potassium 4.6; Sodium 138  Lipid Panel    Component Value Date/Time   CHOL 171 03/13/2019 0945   TRIG 169.0 (H) 03/13/2019 0945   HDL 51.70 03/13/2019 0945   CHOLHDL 3 03/13/2019 0945   VLDL 33.8 03/13/2019 0945   LDLCALC 85 03/13/2019 0945      Wt Readings from Last 3 Encounters:  05/11/19 248 lb (112.5 kg)  04/25/19 246 lb 3 oz (111.7 kg)  03/22/19 245 lb (111.1 kg)         ASSESSMENT AND PLAN:  1.  Chronic atrial fibrillation: Ventricular rate is controlled without any medications.  He is tolerating anticoagulation with Eliquis with no reported side effects.  He has been taking it once daily instead of twice daily to save on cost.  I discussed with him that this is not an ideal approach.  I discussed the option of switching to warfarin but he does not want to.  We will provide samples if available.  2. Essential hypertension:  Blood pressure is elevated today but his blood pressure is usually controlled.  Continue to monitor for now.  3. Hyperlipidemia: Currently on atorvastatin 80 mg once daily.  4.  Chronic diastolic heart failure: He has not required furosemide since August and he appears to be euvolemic.  5.  Carotid artery disease status post bilateral carotid endarterectomy: Followed by [facility].    Disposition:   FU with me in 6 months  Signed,  Kathlyn Sacramento, MD   05/11/2019 9:39 AM    [place]

## 2019-05-11 NOTE — Patient Instructions (Signed)
Medication Instructions:  Your physician recommends that you continue on your current medications as directed. Please refer to the Current Medication list given to you today.  *If you need a refill on your cardiac medications before your next appointment, please call your pharmacy*  Lab Work: None ordered If you have labs (blood work) drawn today and your tests are completely normal, you will receive your results only by: Marland Kitchen MyChart Message (if you have MyChart) OR . A paper copy in the mail If you have any lab test that is abnormal or we need to change your treatment, we will call you to review the results.  Testing/Procedures: None ordered  Follow-Up: At [facility], you and your health needs are our priority.  As part of our continuing mission to provide you with exceptional heart care, we have created designated Provider Care Teams.  These Care Teams include your primary Cardiologist (physician) and Advanced Practice Providers (APPs -  Physician Assistants and Nurse Practitioners) who all work together to provide you with the care you need, when you need it.  Your next appointment:   6 months  The format for your next appointment:   In Person  Provider:    You may see Dr. Fletcher Anon  or one of the following Advanced Practice Providers on your designated Care Team:    Murray Hodgkins, NP  Christell Faith, PA-C  Marrianne Mood, PA-C   Other Instructions Samples of this drug were given to the patient Eliquis quantity 3 boxes  Lot Number  [id number]

## 2019-05-25 ENCOUNTER — Other Ambulatory Visit: Payer: Self-pay

## 2019-05-25 DIAGNOSIS — I6523 Occlusion and stenosis of bilateral carotid arteries: Secondary | ICD-10-CM

## 2019-06-01 ENCOUNTER — Ambulatory Visit (HOSPITAL_COMMUNITY)
Admission: RE | Admit: 2019-06-01 | Discharge: 2019-06-01 | Disposition: A | Discharge status: Home / Self Care | Payer: Medicare Other | Source: Ambulatory Visit | Attending: Family | Admitting: Family

## 2019-06-01 ENCOUNTER — Encounter: Payer: Self-pay | Admitting: Vascular Surgery

## 2019-06-01 ENCOUNTER — Ambulatory Visit: Payer: Medicare Other | Admitting: Vascular Surgery

## 2019-06-01 ENCOUNTER — Other Ambulatory Visit: Payer: Self-pay

## 2019-06-01 VITALS — BP 130/68 | HR 61 | Temp 97.3°F | Resp 20 | Ht 76.0 in | Wt 246.0 lb

## 2019-06-01 DIAGNOSIS — I6523 Occlusion and stenosis of bilateral carotid arteries: Secondary | ICD-10-CM

## 2019-06-01 NOTE — Progress Notes (Signed)
Patient is an 85 year old male who returns for follow-up today.  He underwent right carotid endarterectomy October 2019.  Previously had left carotid endarterectomy 2013.  He has no symptoms of TIA amaurosis or stroke.  He is on Eliquis and atorvastatin.  He is on the Eliquis for atrial fibrillation.  Past Medical History:  Diagnosis Date  . Abdominal bruit 05/05/2012  . Arthritis    back pain, past lumbar surgery   . BPH (benign prostatic hypertrophy)   . Carotid arterial disease (HCC)    a. s/p left-sided CEA  . Chronic atrial fibrillation ([place])    a. CHADS2VASc => 5 (HTN, age x 2, DM, vascular disease); Eliquis  . Diabetes mellitus type II    diet controlled   . HLD (hyperlipidemia)   . HTN (hypertension)   . Lung cancer ([place])    a. s/p resection and chemo in 2002  . TIA (transient ischemic attack) 2019   Past Surgical History:  Procedure Laterality Date  . ARCH AORTOGRAM  05/27/2012   Procedure: ARCH AORTOGRAM;  Surgeon: Elam Dutch, MD;  Location: [facility] CATH LAB;  Service: Cardiovascular;;  . CARDIOVERSION  06/29/2012   Procedure: CARDIOVERSION;  Surgeon: Thayer Headings, MD;  Location: [facility] ENDOSCOPY;  Service: Cardiovascular;  Laterality: N/A;  . CAROTID ANGIOGRAM N/A 05/27/2012   Procedure: CAROTID Cyril Loosen;  Surgeon: Elam Dutch, MD;  Location: [facility] CATH LAB;  Service: Cardiovascular;  Laterality: N/A;  . CAROTID ENDARTERECTOMY Left 05/30/12  . CATARACT EXTRACTION     right  . ENDARTERECTOMY  05/30/2012   Procedure: ENDARTERECTOMY CAROTID;  Surgeon: Elam Dutch, MD;  Location: [place];  Service: Vascular;  Laterality: Left;  . ENDARTERECTOMY Right 04/25/2018   Procedure: ENDARTERECTOMY RIGHT CAROTID;  Surgeon: Elam Dutch, MD;  Location: [place];  Service: Vascular;  Laterality: Right;  . EYE SURGERY     R detached retina- 1990's, IOL implants also   . [place]  . JOINT REPLACEMENT     knee- bilateral- 2009 and 2012  . LOBECTOMY  03/15/01   LUL  [place])  . NOSE SURGERY    . PATCH ANGIOPLASTY Right 04/25/2018   Procedure: PATCH ANGIOPLASTY RIGHT CAROTID ARTERY USING HEMASHILED PLATINUM FINESSE PATCH;  Surgeon: Elam Dutch, MD;  Location: [place];  Service: Vascular;  Laterality: Right;  . RETINAL DETACHMENT SURGERY     right  . SPINE SURGERY  approx 2006   Ruptered Disc  . TONSILLECTOMY     1939  . TOTAL KNEE ARTHROPLASTY     x2 per Dr. Rush Farmer  . WEDGE RESECTION  03/15/01   LUL [place])    Current Outpatient Medications on File Prior to Visit  Medication Sig Dispense Refill  . amLODipine (NORVASC) 5 MG tablet Take 1 tablet by mouth once daily 90 tablet 1  . atorvastatin (LIPITOR) 80 MG tablet TAKE 1 TABLET BY MOUTH AT BEDTIME 90 tablet 3  . ELIQUIS 5 MG TABS tablet Take 1 tablet by mouth twice daily 60 tablet 5  . fish oil-omega-3 fatty acids 1000 MG capsule Take 1 g by mouth 3 (three) times daily.     . Fluticasone-Umeclidin-Vilant (TRELEGY ELLIPTA) 100-62.5-25 MCG/INH AEPB Inhale 1 applicator into the lungs daily. Rinse mouth after use. 1 each 10  . furosemide (LASIX) 20 MG tablet Take 1-2 tablets (20-40 mg total) by mouth daily as needed for fluid. 60 tablet 2  . metFORMIN (GLUCOPHAGE) 500 MG tablet Take 1 tablet (500 mg total) by  mouth 2 (two) times daily with a meal. 180 tablet 3  . methocarbamol (ROBAXIN) 500 MG tablet TAKE 1 TABLET BY MOUTH AT BEDTIME AS NEEDED FOR  RESTLESS  LEGS 50 tablet 0  . Multiple Vitamins-Minerals (PRESERVISION AREDS 2 PO) Take 1 Can by mouth 2 (two) times daily.     . ramipril (ALTACE) 10 MG capsule Take 1 capsule (10 mg total) by mouth daily. 90 capsule 3  . tamsulosin (FLOMAX) 0.4 MG CAPS capsule Take 1 capsule by mouth once daily 90 capsule 1   No current facility-administered medications on file prior to visit.     Social History   Socioeconomic History  . Marital status: Married    Spouse name: Not on file  . Number of children: 3  . Years of education: Not on file   . Highest education level: Not on file  Occupational History  . 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 Financial resource strain: Not hard at all  . Food insecurity    Worry: Never true    Inability: Never true  . Transportation needs    Medical: No    Non-medical: No  Tobacco Use  . Smoking status: Former Smoker    Packs/day: 1.00    Years: 32.00    Pack years: 32.00    Types: Cigarettes    Quit date: 09/03/1982    Years since quitting: 36.7  . Smokeless tobacco: Never Used  Substance and Sexual Activity  . Alcohol use: No    Alcohol/week: 0.0 standard drinks    Comment:    . Drug use: No  . Sexual activity: Yes  Lifestyle  . Physical activity    Days per week: 7 days    Minutes per session: 20 min  . Stress: Not at all  Relationships  . Social Herbalist on phone: Not on file    Gets together: Not on file    Attends religious service: Not on file    Active member of club or organization: Not on file    Attends meetings of clubs or organizations: Not on file    Relationship status: Not on file  . Intimate partner violence    Fear of current or ex partner: No    Emotionally abused: No    Physically abused: No    Forced sexual activity: No  Other Topics Concern  . Not on file  Social History Narrative   Retired Personal assistant   From [employer]   Married happily since 1964    Review of systems: He has shortness of breath with exertion.  He has no chest pain.  Physical exam:  Vitals:   06/01/19 1048 06/01/19 1053  BP: 134/74 130/68  Pulse: 61   Resp: 20   Temp: (!) 97.3 F (36.3 C)   SpO2: 96%   Weight: 246 lb (111.6 kg)   Height: 6\' 4"  (1.93 m)     Extremities: 2+ radial pulses bilaterally  Neck: Well-healed neck incisions  Neuro: Symmetric upper extremity and lower extremity motor strength which is 5/5 no facial asymmetry  Data: Patient had a carotid duplex exam today which showed no significant carotid stenosis  bilaterally.  I reviewed and interpreted the study.  Assessment: Doing well status post bilateral carotid endarterectomy.  Patient currently is asymptomatic and has no significant carotid stenosis.  In light of the fact that he is 85 years old I think the chances of him developing recurrent stenosis at this point  would be fairly minimal.  I do not believe that he needs further follow-up unless he develops new neurologic symptoms.  Plan: Patient will follow-up on an as-needed basis unless he develops new neurologic symptoms at which time we would repeat his carotid duplex scan.  He will continue his Eliquis and his atorvastatin.  Ruta Hinds, MD [facility] Office: [phone]

## 2019-06-05 ENCOUNTER — Telehealth: Payer: Self-pay | Admitting: Pulmonary Disease

## 2019-06-05 NOTE — Telephone Encounter (Signed)
ATC pt to relay date time of covid test- line rang for >54min with no option to leave vm. Will call back.  06/07/2019 prior to 11:[facility]a [facility]

## 2019-06-06 ENCOUNTER — Other Ambulatory Visit: Payer: Self-pay

## 2019-06-06 NOTE — Telephone Encounter (Signed)
Pt is aware of date/time of covid test.   

## 2019-06-07 ENCOUNTER — Other Ambulatory Visit
Admission: RE | Admit: 2019-06-07 | Discharge: 2019-06-07 | Disposition: A | Discharge status: Home / Self Care | Payer: Medicare Other | Source: Ambulatory Visit | Attending: Internal Medicine | Admitting: Internal Medicine

## 2019-06-07 ENCOUNTER — Other Ambulatory Visit: Payer: Self-pay

## 2019-06-07 DIAGNOSIS — Z01812 Encounter for preprocedural laboratory examination: Secondary | ICD-10-CM | POA: Insufficient documentation

## 2019-06-07 DIAGNOSIS — Z20828 Contact with and (suspected) exposure to other viral communicable diseases: Secondary | ICD-10-CM | POA: Insufficient documentation

## 2019-06-07 LAB — SARS CORONAVIRUS 2 (TAT 6-24 HRS): SARS Coronavirus 2: NEGATIVE

## 2019-06-08 ENCOUNTER — Other Ambulatory Visit: Payer: Self-pay

## 2019-06-08 ENCOUNTER — Ambulatory Visit: Payer: Medicare Other | Attending: Internal Medicine

## 2019-06-08 DIAGNOSIS — J449 Chronic obstructive pulmonary disease, unspecified: Secondary | ICD-10-CM | POA: Insufficient documentation

## 2019-06-08 MED ORDER — ALBUTEROL SULFATE (2.5 MG/3ML) 0.083% IN NEBU
2.5000 mg | INHALATION_SOLUTION | Freq: Once | RESPIRATORY_TRACT | Status: AC
  Administered 2019-06-08: 2.5 mg via RESPIRATORY_TRACT
  Filled 2019-06-08: qty 3

## 2019-07-03 ENCOUNTER — Ambulatory Visit: Payer: Medicare Other | Admitting: Podiatry

## 2019-07-03 ENCOUNTER — Other Ambulatory Visit: Payer: Self-pay

## 2019-07-03 ENCOUNTER — Encounter: Payer: Self-pay | Admitting: Podiatry

## 2019-07-03 DIAGNOSIS — L603 Nail dystrophy: Secondary | ICD-10-CM

## 2019-07-03 DIAGNOSIS — E119 Type 2 diabetes mellitus without complications: Secondary | ICD-10-CM | POA: Diagnosis not present

## 2019-07-03 DIAGNOSIS — D689 Coagulation defect, unspecified: Secondary | ICD-10-CM | POA: Diagnosis not present

## 2019-07-03 NOTE — Progress Notes (Signed)
Complaint:  Visit Type: Patient returns to my office for continued preventative foot care services. Complaint: Patient states" my nails have grown long and thick and become painful to walk and wear shoes".  He says his great toenail and second toenails are thick and curved and painful. Patient has been diagnosed with DM with no foot complications. The patient presents for preventative foot care services. No changes to ROS.  Patient is taking eliquiss.  Podiatric Exam: Vascular: dorsalis pedis and posterior tibial pulses are palpable bilateral. Capillary return is immediate. Temperature gradient is WNL. Skin turgor WNL  Sensorium: Normal Semmes Weinstein monofilament test. Normal tactile sensation bilaterally. Nail Exam: Pt has thick disfigured discolored nails with subungual debris noted bilateral entire nail hallux through fifth toenails Ulcer Exam: There is no evidence of ulcer or pre-ulcerative changes or infection. Orthopedic Exam: Muscle tone and strength are WNL. No limitations in general ROM. No crepitus or effusions noted. Foot type and digits show no abnormalities. Bony prominences are unremarkable. Skin: No Porokeratosis. No infection or ulcers  Diagnosis:  Onychomycosis, , Pain in right toe  Treatment & Plan Procedures and Treatment: Consent by patient was obtained for treatment procedures.   Debridement of mycotic and hypertrophic toenails, 1 through 2 right foot. and clearing of subungual debris. No ulceration, no infection noted.  Return Visit-Office Procedure: Patient instructed to return to the office for a follow up visit 3 months for continued evaluation and treatment.    Gardiner Barefoot DPM

## 2019-07-10 ENCOUNTER — Other Ambulatory Visit: Payer: Self-pay

## 2019-07-10 ENCOUNTER — Ambulatory Visit (INDEPENDENT_AMBULATORY_CARE_PROVIDER_SITE_OTHER): Payer: Medicare Other | Admitting: Family Medicine

## 2019-07-10 ENCOUNTER — Encounter: Payer: Self-pay | Admitting: Family Medicine

## 2019-07-10 VITALS — BP 150/86 | HR 96 | Temp 97.1°F | Ht 76.0 in | Wt 248.2 lb

## 2019-07-10 DIAGNOSIS — R339 Retention of urine, unspecified: Secondary | ICD-10-CM | POA: Diagnosis not present

## 2019-07-10 LAB — POCT URINALYSIS DIP (CLINITEK)
Bilirubin, UA: NEGATIVE
Blood, UA: NEGATIVE
Glucose, UA: 100 mg/dL — AB
Ketones, POC UA: NEGATIVE mg/dL
Leukocytes, UA: NEGATIVE
Nitrite, UA: NEGATIVE
POC PROTEIN,UA: 30 — AB
Spec Grav, UA: 1.025 (ref 1.010–1.025)
Urobilinogen, UA: NEGATIVE E.U./dL — AB
pH, UA: 6.5 (ref 5.0–8.0)

## 2019-07-10 NOTE — Progress Notes (Signed)
This visit occurred during the SARS-CoV-2 public health emergency.  Safety protocols were in place, including screening questions prior to the visit, additional usage of staff PPE, and extensive cleaning of exam room while observing appropriate contact time as indicated for disinfecting solutions.  Urinary retention.  No FCNAVD.  Last UOP was yesterday ~3 AM.   Minimal UOP in the meantime.  Has the urge to urinate.  He can dribble a little.  He is still on flomax.  He has not had trouble like this previously.  Marland Kitchenoldmed  nad ncat rrr ctab Abd soft, suprapubic area full and tender without rebound.  Normal bowel sounds.  Discussed options.  We do have a Foley we can place here in the clinic.  He consented.  Rationale for use discussed with patient.  Under usual sterile technique Foley was easily advanced into the bladder.  This immediately produced approximately 1000 mL of clear urine without blood.  He tolerated the procedure well and clearly felt better.  I placed a Foley incoordination with Georgiana Spinner, RN.   Recheck blood pressure improved.  He knows how to maintain the Foley and he understands that he can use the valve on the bottom of the bag to drain the bag as needed.  Sterile urine culture was collected at the time of Foley placement.  Urinalysis done.  See notes on labs.

## 2019-07-10 NOTE — Patient Instructions (Signed)
Keep the bag in place so it can fill via gravity.  Empty it as needed.   We'll call about seeing urology.  Update me as needed.

## 2019-07-11 LAB — URINE CULTURE
MICRO NUMBER:: 1217810
Result:: NO GROWTH
SPECIMEN QUALITY:: ADEQUATE

## 2019-07-12 DIAGNOSIS — R339 Retention of urine, unspecified: Secondary | ICD-10-CM | POA: Insufficient documentation

## 2019-07-12 NOTE — Assessment & Plan Note (Signed)
He clearly felt better with Foley placement.  See above.  Refer to urology.  They want to see patient in approximately 1 week.  Leave Foley in the meantime.  Routine cautions and Foley maintenance discussed with patient.  See notes on urine culture.  At this point still okay for outpatient follow-up.  He will update [place] as needed.  >25 minutes spent in face to face time with patient, >50% spent in counselling or coordination of care

## 2019-07-17 ENCOUNTER — Ambulatory Visit: Payer: Self-pay | Admitting: Physician Assistant

## 2019-07-17 ENCOUNTER — Encounter: Payer: Self-pay | Admitting: Urology

## 2019-07-17 ENCOUNTER — Other Ambulatory Visit: Payer: Self-pay

## 2019-07-17 ENCOUNTER — Ambulatory Visit: Payer: Medicare Other | Admitting: Urology

## 2019-07-17 VITALS — BP 136/84 | HR 76 | Ht 76.0 in | Wt 248.0 lb

## 2019-07-17 DIAGNOSIS — R339 Retention of urine, unspecified: Secondary | ICD-10-CM

## 2019-07-17 LAB — BLADDER SCAN AMB NON-IMAGING: Scan Result: 620

## 2019-07-17 NOTE — Progress Notes (Signed)
Patient return to clinic this afternoon for PVR.  He reports only dribbling urination since his catheter removal this morning.  He reports significant constipation; he started MiraLAX for management of this yesterday and was able to have 1 bowel movement earlier today.  PVR significantly elevated.  I recommend Foley catheter replacement for 1 week with repeat voiding trial in our office thereafter.  I advised him to continue daily MiraLAX for management of his constipation in the interim.  He expressed understanding.  Procedure note below.  Results for orders placed or performed in visit on 07/17/19  Bladder Scan (Post Void Residual) in office  Result Value Ref Range   Scan Result 620     Simple Catheter Placement  Due to urinary retention patient is present today for a foley cath placement.  Patient was cleaned and prepped in a sterile fashion with betadine and lidocaine jelly 2% was instilled into the urethra.  A 16 FR coud foley catheter was inserted, urine return was noted  663ml, urine was pink and runny in color.  The balloon was filled with 10cc of sterile water.  A leg bag was attached for drainage. Patient was also given a night bag to take home and was given instruction on how to change from one bag to another.  Patient was given instruction on proper catheter care.  Patient tolerated well, no complications were noted   Performed by: Debroah Loop, PA-C   Additional notes/ Follow up: Return in about 1 week (around 07/24/2019) for Voiding trial.  I counseled the patient on signs and symptoms of gross hematuria requiring urgent evaluation, including thick, ketchup-like urinary output; passage of large clots; dark, maroon-colored urine; lower abdominal pain; lumbar pain; abdominal distention; and the inability to urinate.  I advised him to contact the office for assistance if he develops these symptoms during routine office hours, 8 AM to 5 PM Monday through Friday.  If outside those  hours, I advised him to proceed to the emergency department. He expressed understanding.

## 2019-07-17 NOTE — Progress Notes (Signed)
Fill and Pull Catheter Removal  Patient is present today for a catheter removal.  Patient was cleaned and prepped in a sterile fashion 166ml of sterile water/ saline was instilled into the bladder when the patient felt the urge to urinate. 5ml of water was then drained from the balloon.  A 16FR foley cath was removed from the bladder no complications were noted .  Patient as then given some time to void on their own.  Patient cannot void   on their own after some time.  Patient tolerated well.  Performed by: Verlene Mayer, CMA  Follow up/ Additional notes: will return this afternoon with PVR

## 2019-07-17 NOTE — Progress Notes (Signed)
07/17/2019 8:29 AM   [name] 05-Oct-1932 [id number]  Referring provider: Tonia Ghent, MD [address] [place],  [place] [address]  Chief Complaint  Patient presents with  . Urinary Retention    HPI: I was consulted to assess the patient's recent episode of retention which occurred 1 week ago fairly suddenly.  He was catheterized for 1 L.  He has been on Flomax for years.  At baseline his flow is reasonable.  He voids every 2 hours and gets up once a night.  He has been constipated for many months.  He is a diabetic not on insulin.  He has had a TIA 1 year ago.  No history of kidney stones bladder infections or previous GU surgery  Modifying factors: There are no other modifying factors  Associated signs and symptoms: There are no other associated signs and symptoms Aggravating and relieving factors: There are no other aggravating or relieving factors Severity: Moderate Duration: Persistent   PMH: Past Medical History:  Diagnosis Date  . Abdominal bruit 05/05/2012  . Arthritis    back pain, past lumbar surgery   . BPH (benign prostatic hypertrophy)   . Carotid arterial disease (HCC)    a. s/p left-sided CEA  . Chronic atrial fibrillation ([place])    a. CHADS2VASc => 5 (HTN, age x 2, DM, vascular disease); Eliquis  . Diabetes mellitus type II    diet controlled   . HLD (hyperlipidemia)   . HTN (hypertension)   . Lung cancer ([place])    a. s/p resection and chemo in 2002  . TIA (transient ischemic attack) 2019    Surgical History: Past Surgical History:  Procedure Laterality Date  . ARCH AORTOGRAM  05/27/2012   Procedure: ARCH AORTOGRAM;  Surgeon: [place] Dutch, MD;  Location: [place] CATH LAB;  Service: Cardiovascular;;  . CARDIOVERSION  06/29/2012   Procedure: CARDIOVERSION;  Surgeon: [place] Headings, MD;  Location: [place];  Service: Cardiovascular;  Laterality: N/A;  . CAROTID ANGIOGRAM N/A 05/27/2012   Procedure: CAROTID [place];  Surgeon:  [place] Dutch, MD;  Location: [facility] CATH LAB;  Service: Cardiovascular;  Laterality: N/A;  . CAROTID ENDARTERECTOMY Left 05/30/12  . CATARACT EXTRACTION     right  . ENDARTERECTOMY  05/30/2012   Procedure: ENDARTERECTOMY CAROTID;  Surgeon: [place] Dutch, MD;  Location: [place];  Service: Vascular;  Laterality: Left;  . ENDARTERECTOMY Right 04/25/2018   Procedure: ENDARTERECTOMY RIGHT CAROTID;  Surgeon: [place] Dutch, MD;  Location: [place];  Service: Vascular;  Laterality: Right;  . EYE SURGERY     R detached retina- 1990's, IOL implants also   . [place]  . JOINT REPLACEMENT     knee- bilateral- 2009 and 2012  . LOBECTOMY  03/15/01   LUL [place])  . NOSE SURGERY    . PATCH ANGIOPLASTY Right 04/25/2018   Procedure: PATCH ANGIOPLASTY RIGHT CAROTID ARTERY USING HEMASHILED PLATINUM FINESSE PATCH;  Surgeon: [place] Dutch, MD;  Location: [place];  Service: Vascular;  Laterality: Right;  . RETINAL DETACHMENT SURGERY     right  . SPINE SURGERY  approx 2006   Ruptered Disc  . TONSILLECTOMY     1939  . TOTAL KNEE ARTHROPLASTY     x2 per Dr. Rush [place]  . WEDGE RESECTION  03/15/01   LUL [place])    Home Medications:  Allergies as of 07/17/2019      Reactions   Xarelto [rivaroxaban] Other (See Comments)  vertigo      Medication List       Accurate as of July 17, 2019  8:29 AM. If you have any questions, ask your nurse or doctor.        amLODipine 5 MG tablet Commonly known as: NORVASC Take 1 tablet by mouth once daily   atorvastatin 80 MG tablet Commonly known as: LIPITOR TAKE 1 TABLET BY MOUTH AT BEDTIME   Eliquis 5 MG Tabs tablet Generic drug: apixaban Take 1 tablet by mouth twice daily   fish oil-omega-3 fatty acids 1000 MG capsule Take 1 g by mouth 3 (three) times daily.   furosemide 20 MG tablet Commonly known as: LASIX Take 1-2 tablets (20-40 mg total) by mouth daily as needed for fluid.   metFORMIN 500 MG tablet Commonly known  as: GLUCOPHAGE Take 1 tablet (500 mg total) by mouth 2 (two) times daily with a meal.   methocarbamol 500 MG tablet Commonly known as: ROBAXIN TAKE 1 TABLET BY MOUTH AT BEDTIME AS NEEDED FOR  RESTLESS  LEGS   PRESERVISION AREDS 2 PO Take 1 Can by mouth 2 (two) times daily.   ramipril 10 MG capsule Commonly known as: ALTACE Take 1 capsule (10 mg total) by mouth daily.   tamsulosin 0.4 MG Caps capsule Commonly known as: FLOMAX Take 1 capsule by mouth once daily   Trelegy Ellipta 100-62.5-25 MCG/INH Aepb Generic drug: Fluticasone-Umeclidin-Vilant Inhale 1 applicator into the lungs daily. Rinse mouth after use.       Allergies:  Allergies  Allergen Reactions  . Xarelto [Rivaroxaban] Other (See Comments)    vertigo    Family History: Family History  Problem Relation Age of Onset  . Emphysema Father   . Dementia Mother   . Diabetes Mother   . Hypertension Mother   . Hypothyroidism Sister   . Dementia Brother   . Hypertension Brother   . Diabetes Son   . Heart disease Son        Heart Disease before age 75- Open Heart 2010  . Hypertension Son   . Stroke Son        while on coumadin  . Prostate cancer Neg Hx   . Colon cancer Neg Hx     Social History:  reports that he quit smoking about 36 years ago. His smoking use included cigarettes. He has a 32.00 pack-year smoking history. He has never used smokeless tobacco. He reports that he does not drink alcohol or use drugs.  ROS: UROLOGY Frequent Urination?: No Hard to postpone urination?: No Burning/pain with urination?: No Get up at night to urinate?: No Leakage of urine?: No Urine stream starts and stops?: No Trouble starting stream?: Yes Do you have to strain to urinate?: No Blood in urine?: No Urinary tract infection?: No Sexually transmitted disease?: No Injury to kidneys or bladder?: No Painful intercourse?: No Weak stream?: No Erection problems?: No Penile pain?: No  Gastrointestinal Nausea?:  No Vomiting?: No Indigestion/heartburn?: No Diarrhea?: No Constipation?: No  Constitutional Fever: No Night sweats?: No Weight loss?: No Fatigue?: No  Skin Skin rash/lesions?: No  Eyes Blurred vision?: No Double vision?: No  Ears/Nose/Throat Sore throat?: No Sinus problems?: No  Hematologic/Lymphatic Swollen glands?: No Easy bruising?: No  Cardiovascular Leg swelling?: No Chest pain?: No  Respiratory Cough?: No Shortness of breath?: No  Endocrine Excessive thirst?: No  Musculoskeletal Back pain?: No Joint pain?: No  Neurological Headaches?: No Dizziness?: No  Psychologic Depression?: No Anxiety?: No  Physical Exam: BP 136/84  Pulse 76   Ht 6\' 4"  (1.93 m)   Wt 112.5 kg   BMI 30.19 kg/m   Constitutional:  Alert and oriented, No acute distress. HEENT: [place] AT, moist mucus membranes.  Trachea midline, no masses. Cardiovascular: No clubbing, cyanosis, or edema. Respiratory: Normal respiratory effort, no increased work of breathing. GI: Abdomen is soft, nontender, nondistended, no abdominal masses GU: No CVA tenderness.  50 to 60 g benign prostate with some stool in the rectum Skin: No rashes, bruises or suspicious lesions. Lymph: No cervical or inguinal adenopathy. Neurologic: Grossly intact, no focal deficits, moving all 4 extremities. Psychiatric: Normal mood and affect.  Laboratory Data: Lab Results  Component Value Date   WBC 8.5 03/13/2019   HGB 15.3 03/13/2019   HCT 45.8 03/13/2019   MCV 89.6 03/13/2019   PLT 196.0 03/13/2019    Lab Results  Component Value Date   CREATININE 0.90 03/13/2019    Lab Results  Component Value Date   PSA 1.72 01/17/2013   PSA 1.81 08/25/2011   PSA 1.56 10/27/2010    No results found for: TESTOSTERONE  Lab Results  Component Value Date   HGBA1C 9.8 (H) 03/13/2019    Urinalysis    Component Value Date/Time   COLORURINE YELLOW 04/18/2018 0838   APPEARANCEUR CLEAR 04/18/2018 0838   LABSPEC  1.013 04/18/2018 0838   PHURINE 7.0 04/18/2018 0838   GLUCOSEU NEGATIVE 04/18/2018 0838   HGBUR NEGATIVE 04/18/2018 0838   HGBUR large 05/06/2009 1222   BILIRUBINUR negative 07/10/2019 1338   BILIRUBINUR Neg 01/01/2015 1223   KETONESUR negative 07/10/2019 [place] 04/18/2018 0838   PROTEINUR NEGATIVE 04/18/2018 0838   UROBILINOGEN negative (A) 07/10/2019 1338   UROBILINOGEN 1.0 05/27/2012 1405   NITRITE Negative 07/10/2019 1338   NITRITE NEGATIVE 04/18/2018 0838   LEUKOCYTESUR Negative 07/10/2019 1338    Pertinent Imaging:   Assessment & Plan: Patient developed retention on Flomax.  Pathophysiology described.  Voiding trial recommended.  There are no diagnoses linked to this encounter.  No follow-ups on file.  Reece Packer, MD  [facility] [address] [place], [place] [address] [phone]

## 2019-07-24 ENCOUNTER — Ambulatory Visit: Payer: Medicare Other | Admitting: Physician Assistant

## 2019-07-24 ENCOUNTER — Other Ambulatory Visit: Payer: Self-pay

## 2019-07-24 ENCOUNTER — Ambulatory Visit (INDEPENDENT_AMBULATORY_CARE_PROVIDER_SITE_OTHER): Payer: Medicare Other | Admitting: Physician Assistant

## 2019-07-24 ENCOUNTER — Encounter: Payer: Self-pay | Admitting: Physician Assistant

## 2019-07-24 VITALS — BP 175/71 | HR 102 | Ht 71.0 in | Wt 248.0 lb

## 2019-07-24 DIAGNOSIS — R338 Other retention of urine: Secondary | ICD-10-CM

## 2019-07-24 LAB — BLADDER SCAN AMB NON-IMAGING: Scan Result: 330

## 2019-07-24 NOTE — Progress Notes (Signed)
Simple Catheter Placement  Due to incomplete bladder emptying patient is present today for a foley cath placement.  Patient was cleaned and prepped in a sterile fashion with betadine and lidocaine jelly 2% was instilled into the urethra.  A 16 FR coud foley catheter was inserted, urine return was noted  352ml, urine was yellow in color.  The balloon was filled with 10cc of sterile water.  A leg bag was attached for drainage. Patient declined an additional leg or night bag.  Patient was given instruction on proper catheter care.  Patient tolerated well, no complications were noted   Performed by: Debroah Loop, PA-C

## 2019-07-24 NOTE — Progress Notes (Signed)
07/24/2019 9:40 AM   Jonathan York January 19, 1933 [id number]  CC: Voiding trial  HPI: Jonathan York is a 86 y.o. male who presents today for a second voiding trial. He was previously seen by Dr. Matilde Sprang for a first voiding trial after having developed acute urinary retention in the setting of chronic constipation, on Flomax. He failed this trial with follow-up PVR 621mL.   Today, he reports intermittent discomfort associated with his urinary catheter.  He has been taking 1 capful of MiraLAX daily x2 weeks for management of his constipation.  He reports 1 bowel movement daily and describes his stool as firm and round.  PMH: Past Medical History:  Diagnosis Date  . Abdominal bruit 05/05/2012  . Arthritis    back pain, past lumbar surgery   . BPH (benign prostatic hypertrophy)   . Carotid arterial disease (HCC)    a. s/p left-sided CEA  . Chronic atrial fibrillation ([place])    a. CHADS2VASc => 5 (HTN, age x 2, DM, vascular disease); Eliquis  . Diabetes mellitus type II    diet controlled   . HLD (hyperlipidemia)   . HTN (hypertension)   . Lung cancer ([place])    a. s/p resection and chemo in 2002  . TIA (transient ischemic attack) 2019    Surgical History: Past Surgical History:  Procedure Laterality Date  . ARCH AORTOGRAM  05/27/2012   Procedure: ARCH AORTOGRAM;  Surgeon: Elam Dutch, MD;  Location: [facility] CATH LAB;  Service: Cardiovascular;;  . CARDIOVERSION  06/29/2012   Procedure: CARDIOVERSION;  Surgeon: Thayer Headings, MD;  Location: [facility] ENDOSCOPY;  Service: Cardiovascular;  Laterality: N/A;  . CAROTID ANGIOGRAM N/A 05/27/2012   Procedure: CAROTID Cyril Loosen;  Surgeon: Elam Dutch, MD;  Location: [facility] CATH LAB;  Service: Cardiovascular;  Laterality: N/A;  . CAROTID ENDARTERECTOMY Left 05/30/12  . CATARACT EXTRACTION     right  . ENDARTERECTOMY  05/30/2012   Procedure: ENDARTERECTOMY CAROTID;  Surgeon: Elam Dutch, MD;  Location: [place];  Service: Vascular;   Laterality: Left;  . ENDARTERECTOMY Right 04/25/2018   Procedure: ENDARTERECTOMY RIGHT CAROTID;  Surgeon: Elam Dutch, MD;  Location: [place];  Service: Vascular;  Laterality: Right;  . EYE SURGERY     Jonathan detached retina- 1990's, IOL implants also   . [place]  . JOINT REPLACEMENT     knee- bilateral- 2009 and 2012  . LOBECTOMY  03/15/01   LUL Roxan Hockey)  . NOSE SURGERY    . PATCH ANGIOPLASTY Right 04/25/2018   Procedure: PATCH ANGIOPLASTY RIGHT CAROTID ARTERY USING HEMASHILED PLATINUM FINESSE PATCH;  Surgeon: Elam Dutch, MD;  Location: [place];  Service: Vascular;  Laterality: Right;  . RETINAL DETACHMENT SURGERY     right  . SPINE SURGERY  approx 2006   Ruptered Disc  . TONSILLECTOMY     1939  . TOTAL KNEE ARTHROPLASTY     x2 per Dr. Rush Farmer  . WEDGE RESECTION  03/15/01   LUL Roxan Hockey)    Home Medications:  Allergies as of 07/24/2019      Reactions   Xarelto [rivaroxaban] Other (See Comments)   vertigo      Medication List       Accurate as of July 24, 2019  9:40 AM. If you have any questions, ask your nurse or doctor.        amLODipine 5 MG tablet Commonly known as: NORVASC Take 1 tablet by mouth once daily   atorvastatin  80 MG tablet Commonly known as: LIPITOR TAKE 1 TABLET BY MOUTH AT BEDTIME   Eliquis 5 MG Tabs tablet Generic drug: apixaban Take 1 tablet by mouth twice daily   fish oil-omega-3 fatty acids 1000 MG capsule Take 1 g by mouth 3 (three) times daily.   furosemide 20 MG tablet Commonly known as: LASIX Take 1-2 tablets (20-40 mg total) by mouth daily as needed for fluid.   metFORMIN 500 MG tablet Commonly known as: GLUCOPHAGE Take 1 tablet (500 mg total) by mouth 2 (two) times daily with a meal.   methocarbamol 500 MG tablet Commonly known as: ROBAXIN TAKE 1 TABLET BY MOUTH AT BEDTIME AS NEEDED FOR  RESTLESS  LEGS   PRESERVISION AREDS 2 PO Take 1 Can by mouth 2 (two) times daily.   ramipril 10 MG capsule  Commonly known as: ALTACE Take 1 capsule (10 mg total) by mouth daily.   tamsulosin 0.4 MG Caps capsule Commonly known as: FLOMAX Take 1 capsule by mouth once daily   Trelegy Ellipta 100-62.5-25 MCG/INH Aepb Generic drug: Fluticasone-Umeclidin-Vilant Inhale 1 applicator into the lungs daily. Rinse mouth after use.       Allergies:  Allergies  Allergen Reactions  . Xarelto [Rivaroxaban] Other (See Comments)    vertigo    Family History: Family History  Problem Relation Age of Onset  . Emphysema Father   . Dementia Mother   . Diabetes Mother   . Hypertension Mother   . Hypothyroidism Sister   . Dementia Brother   . Hypertension Brother   . Diabetes Son   . Heart disease Son        Heart Disease before age 5- Open Heart 2010  . Hypertension Son   . Stroke Son        while on coumadin  . Prostate cancer Neg Hx   . Colon cancer Neg Hx     Social History:   reports that he quit smoking about 36 years ago. His smoking use included cigarettes. He has a 32.00 pack-year smoking history. He has never used smokeless tobacco. He reports that he does not drink alcohol or use drugs.  ROS: UROLOGY Frequent Urination?: No Hard to postpone urination?: No Burning/pain with urination?: No Get up at night to urinate?: No Leakage of urine?: No Urine stream starts and stops?: No Trouble starting stream?: No Do you have to strain to urinate?: No Blood in urine?: No Urinary tract infection?: No Sexually transmitted disease?: No Injury to kidneys or bladder?: No Painful intercourse?: No Weak stream?: No Erection problems?: No Penile pain?: No  Gastrointestinal Nausea?: No Vomiting?: No Indigestion/heartburn?: No Diarrhea?: No Constipation?: No  Constitutional Fever: No Night sweats?: No Weight loss?: No Fatigue?: No  Skin Skin rash/lesions?: No Itching?: No  Eyes Blurred vision?: No Double vision?: No  Ears/Nose/Throat Sore throat?: No Sinus problems?: No   Hematologic/Lymphatic Swollen glands?: No Easy bruising?: No  Cardiovascular Leg swelling?: No Chest pain?: No  Respiratory Cough?: No Shortness of breath?: Yes  Endocrine Excessive thirst?: No  Musculoskeletal Back pain?: No Joint pain?: No  Neurological Headaches?: No Dizziness?: No  Psychologic Depression?: No Anxiety?: No  Physical Exam: BP (!) 175/71   Pulse (!) 102   Ht 5\' 11"  (1.803 m)   Wt 248 lb (112.5 kg)   BMI 34.59 kg/m   Constitutional:  Alert and oriented, no acute distress, nontoxic appearing HEENT: [place], AT Cardiovascular: No clubbing, cyanosis, or edema Respiratory: Normal respiratory effort, no increased work of breathing  Skin: No rashes, bruises or suspicious lesions Neurologic: Grossly intact, no focal deficits, moving all 4 extremities Psychiatric: Normal mood and affect  Laboratory Data: Results for orders placed or performed in visit on 07/24/19  Bladder Scan (Post Void Residual) in office  Result Value Ref Range   Scan Result 330    Assessment & Plan:   1. Acute urinary retention Completed fill and pull voiding trial in the a.m.; see separate note for details.  Afternoon PVR elevated to 330 mL.  He reports pushing fluids throughout the day and states he has only been able to obtain a dribbling urinary stream in the interim.    I offered him CIC teaching today; he elected to undergo replacement of Foley catheter instead.  See separate note for details.  Patient would like to be evaluated for bladder outlet procedures.  Will set him up for this today.  Return in about 1 week (around 07/31/2019) for Cysto and TRUS.  Debroah Loop, PA-C  [facility] [address] [place], [place] [address] [phone]

## 2019-07-24 NOTE — Progress Notes (Signed)
Fill and Pull Catheter Removal  Patient is present today for a catheter removal.  Patient was cleaned and prepped in a sterile fashion 252ml of sterile saline was instilled into the bladder when the patient felt the urge to urinate. 61ml of water was then drained from the balloon.  A 16FR coud foley cath was removed from the bladder no complications were noted .  Patient as then given some time to void on their own.  Patient can void  90ml on their own after some time.  Patient tolerated well.  Performed by: Debroah Loop, PA-C   Follow up/ Additional notes: Push fluids and RTC this afternoon for PVR.

## 2019-07-31 ENCOUNTER — Telehealth: Payer: Self-pay | Admitting: Urology

## 2019-07-31 NOTE — Telephone Encounter (Signed)
Spoke with patient and he states the blood is dark red, denies thick clotted blood and is not constant. Patient was encouraged to drink more water and to keep leg band up high on his thigh to avoid pulling and tugging which could cause discomfort and bleeding. Patient is also on blood thinners and it was explained that seeing blood in bag can happen. He is to call back if blood worsens or becomes thick clotted blood. Patient verbalized understanding

## 2019-07-31 NOTE — Telephone Encounter (Signed)
Pt called and states that he is getting a lot of blood in his foley bag and also states that it is "pulling". Pt denies any other symptoms. Please advise.

## 2019-08-02 ENCOUNTER — Other Ambulatory Visit: Payer: Medicare Other | Admitting: Urology

## 2019-08-04 ENCOUNTER — Other Ambulatory Visit
Admission: RE | Admit: 2019-08-04 | Discharge: 2019-08-04 | Disposition: A | Discharge status: Home / Self Care | Payer: Medicare Other | Source: Ambulatory Visit | Attending: Physician Assistant | Admitting: Physician Assistant

## 2019-08-04 ENCOUNTER — Other Ambulatory Visit: Payer: Self-pay

## 2019-08-04 ENCOUNTER — Encounter: Payer: Self-pay | Admitting: Physician Assistant

## 2019-08-04 ENCOUNTER — Telehealth: Payer: Self-pay

## 2019-08-04 ENCOUNTER — Ambulatory Visit (INDEPENDENT_AMBULATORY_CARE_PROVIDER_SITE_OTHER): Payer: Medicare Other | Admitting: Physician Assistant

## 2019-08-04 VITALS — BP 156/68 | HR 87 | Ht 71.0 in | Wt 248.0 lb

## 2019-08-04 DIAGNOSIS — R310 Gross hematuria: Secondary | ICD-10-CM

## 2019-08-04 LAB — CBC
HCT: 42.3 % (ref 39.0–52.0)
Hemoglobin: 13.7 g/dL (ref 13.0–17.0)
MCH: 28.8 pg (ref 26.0–34.0)
MCHC: 32.4 g/dL (ref 30.0–36.0)
MCV: 88.9 fL (ref 80.0–100.0)
Platelets: 240 10*3/uL (ref 150–400)
RBC: 4.76 MIL/uL (ref 4.22–5.81)
RDW: 13.2 % (ref 11.5–15.5)
WBC: 8.7 10*3/uL (ref 4.0–10.5)
nRBC: 0 % (ref 0.0–0.2)

## 2019-08-04 NOTE — Progress Notes (Signed)
08/04/2019 10:57 AM   Jonathan York 12-11-1932 [id number]  CC: Gross hematuria  HPI: Jonathan York is a 86 y.o. male who presents today for evaluation of gross hematuria.  He is an established [facility] patient most recently seen by me on 07/24/2019 for a second voiding trial in the setting of acute urinary retention associated with constipation.  He failed this and underwent replacement of Foley catheter at that time.  He is scheduled for cystoscopy and TRUS with Dr. Bernardo Heater next week for bladder outlet consultation.  Today, he reports gross hematuria since placement of his Foley catheter.  He states that the color of his urine has been variable, however it has been dark red for the last week.  He denies seeing clot material in his drainage bag and states his urine has remained thin and runny.  He states his catheter has never stopped draining.  He denies pain, shortness of breath, and fatigue.  PMH significant for A. fib on Eliquis, carotid arterial disease s/p left carotid endarterectomy in 2013 and right carotid endarterectomy in 2019, and COPD.  PMH: Past Medical History:  Diagnosis Date  . Abdominal bruit 05/05/2012  . Arthritis    back pain, past lumbar surgery   . BPH (benign prostatic hypertrophy)   . Carotid arterial disease (HCC)    a. s/p left-sided CEA  . Chronic atrial fibrillation ([place])    a. CHADS2VASc => 5 (HTN, age x 2, DM, vascular disease); Eliquis  . Diabetes mellitus type II    diet controlled   . HLD (hyperlipidemia)   . HTN (hypertension)   . Lung cancer ([place])    a. s/p resection and chemo in 2002  . TIA (transient ischemic attack) 2019    Surgical History: Past Surgical History:  Procedure Laterality Date  . ARCH AORTOGRAM  05/27/2012   Procedure: ARCH AORTOGRAM;  Surgeon: Elam Dutch, MD;  Location: [facility] CATH LAB;  Service: Cardiovascular;;  . CARDIOVERSION  06/29/2012   Procedure: CARDIOVERSION;  Surgeon: Thayer Headings, MD;  Location: [facility] ENDOSCOPY;   Service: Cardiovascular;  Laterality: N/A;  . CAROTID ANGIOGRAM N/A 05/27/2012   Procedure: CAROTID Cyril Loosen;  Surgeon: Elam Dutch, MD;  Location: [facility] CATH LAB;  Service: Cardiovascular;  Laterality: N/A;  . CAROTID ENDARTERECTOMY Left 05/30/12  . CATARACT EXTRACTION     right  . ENDARTERECTOMY  05/30/2012   Procedure: ENDARTERECTOMY CAROTID;  Surgeon: Elam Dutch, MD;  Location: [place];  Service: Vascular;  Laterality: Left;  . ENDARTERECTOMY Right 04/25/2018   Procedure: ENDARTERECTOMY RIGHT CAROTID;  Surgeon: Elam Dutch, MD;  Location: [place];  Service: Vascular;  Laterality: Right;  . EYE SURGERY     Jonathan detached retina- 1990's, IOL implants also   . [place]  . JOINT REPLACEMENT     knee- bilateral- 2009 and 2012  . LOBECTOMY  03/15/01   LUL Roxan Hockey)  . NOSE SURGERY    . PATCH ANGIOPLASTY Right 04/25/2018   Procedure: PATCH ANGIOPLASTY RIGHT CAROTID ARTERY USING HEMASHILED PLATINUM FINESSE PATCH;  Surgeon: Elam Dutch, MD;  Location: [place];  Service: Vascular;  Laterality: Right;  . RETINAL DETACHMENT SURGERY     right  . SPINE SURGERY  approx 2006   Ruptered Disc  . TONSILLECTOMY     1939  . TOTAL KNEE ARTHROPLASTY     x2 per Dr. Rush Farmer  . WEDGE RESECTION  03/15/01   LUL Roxan Hockey)    Home Medications:  Allergies as of 08/04/2019      Reactions   Xarelto [rivaroxaban] Other (See Comments)   vertigo      Medication List       Accurate as of August 04, 2019 10:57 AM. If you have any questions, ask your nurse or doctor.        amLODipine 5 MG tablet Commonly known as: NORVASC Take 1 tablet by mouth once daily   atorvastatin 80 MG tablet Commonly known as: LIPITOR TAKE 1 TABLET BY MOUTH AT BEDTIME   Eliquis 5 MG Tabs tablet Generic drug: apixaban Take 1 tablet by mouth twice daily   fish oil-omega-3 fatty acids 1000 MG capsule Take 1 g by mouth 3 (three) times daily.   furosemide 20 MG tablet Commonly known as:  LASIX Take 1-2 tablets (20-40 mg total) by mouth daily as needed for fluid.   metFORMIN 500 MG tablet Commonly known as: GLUCOPHAGE Take 1 tablet (500 mg total) by mouth 2 (two) times daily with a meal.   methocarbamol 500 MG tablet Commonly known as: ROBAXIN TAKE 1 TABLET BY MOUTH AT BEDTIME AS NEEDED FOR  RESTLESS  LEGS   PRESERVISION AREDS 2 PO Take 1 Can by mouth 2 (two) times daily.   ramipril 10 MG capsule Commonly known as: ALTACE Take 1 capsule (10 mg total) by mouth daily.   tamsulosin 0.4 MG Caps capsule Commonly known as: FLOMAX Take 1 capsule by mouth once daily   Trelegy Ellipta 100-62.5-25 MCG/INH Aepb Generic drug: Fluticasone-Umeclidin-Vilant Inhale 1 applicator into the lungs daily. Rinse mouth after use.       Allergies:  Allergies  Allergen Reactions  . Xarelto [Rivaroxaban] Other (See Comments)    vertigo    Family History: Family History  Problem Relation Age of Onset  . Emphysema Father   . Dementia Mother   . Diabetes Mother   . Hypertension Mother   . Hypothyroidism Sister   . Dementia Brother   . Hypertension Brother   . Diabetes Son   . Heart disease Son        Heart Disease before age 84- Open Heart 2010  . Hypertension Son   . Stroke Son        while on coumadin  . Prostate cancer Neg Hx   . Colon cancer Neg Hx     Social History:   reports that he quit smoking about 36 years ago. His smoking use included cigarettes. He has a 32.00 pack-year smoking history. He has never used smokeless tobacco. He reports that he does not drink alcohol or use drugs.  ROS: UROLOGY Frequent Urination?: No Hard to postpone urination?: No Burning/pain with urination?: No Get up at night to urinate?: No Leakage of urine?: No Urine stream starts and stops?: No Trouble starting stream?: No Do you have to strain to urinate?: No Blood in urine?: Yes Urinary tract infection?: No Sexually transmitted disease?: No Injury to kidneys or bladder?:  No Painful intercourse?: No Weak stream?: No Erection problems?: No Penile pain?: No  Gastrointestinal Nausea?: No Vomiting?: No Indigestion/heartburn?: No Diarrhea?: No Constipation?: No  Constitutional Fever: No Night sweats?: No Weight loss?: No Fatigue?: No  Skin Skin rash/lesions?: No Itching?: No  Eyes Blurred vision?: No Double vision?: No  Ears/Nose/Throat Sore throat?: No Sinus problems?: No  Hematologic/Lymphatic Swollen glands?: No Easy bruising?: No  Cardiovascular Leg swelling?: No Chest pain?: No  Respiratory Cough?: No Shortness of breath?: Yes  Endocrine Excessive thirst?: No  Musculoskeletal Back pain?:  No Joint pain?: No  Neurological Headaches?: No Dizziness?: No  Psychologic Depression?: No Anxiety?: No  Physical Exam: BP (!) 156/68 (BP Location: Left Arm, Patient Position: Sitting, Cuff Size: Normal)   Pulse 87   Ht 5\' 11"  (1.803 m)   Wt 248 lb (112.5 kg)   BMI 34.59 kg/m   Constitutional:  Alert and oriented, no acute distress, nontoxic appearing HEENT: [place], AT, conjunctival pallor Cardiovascular: No clubbing, cyanosis, or edema Respiratory: Normal respiratory effort, no increased work of breathing GU: Foley catheter in place draining [place] colored urine without visible clot material Skin: No rashes, bruises or suspicious lesions, generalized pallor Neurologic: Grossly intact, no focal deficits, moving all 4 extremities Psychiatric: Normal mood and affect  Assessment & Plan:   1. Hematuria, gross 86 year old male with PMH BPH and A. fib on Eliquis, recently with acute urinary retention and now with Foley catheter in place awaiting cystoscopy and TRUS for bladder outlet evaluation presents to clinic today with gross hematuria, worse over the past week.  Physical exam significant for pallor with Foley catheter draining runny, [place] colored urine.  He denies fatigue or shortness of breath.  Given his history of enlarged  prostate on anticoagulation, I suspect a prostatic source for his bleeding.  I hyperinflated his Foley catheter balloon today to tamponade his prostate and subsequently irrigated his bladder.  See separate procedure note for more details.  Ultimately, his urine cleared from merlot to dark pink in color.  I never visualized any clot material within it.  I am not concerned for clot retention today.  Given his history of chronic A. fib, I would like to avoid holding Eliquis at this time.  I have ordered a stat CBC for evaluation of hemoglobin and hematocrit given stability of Merlot-colored urinary output x1 week.  If he is anemic, he may require hospitalization for transfusion and possible prostatic fulguration.  If his blood counts are WNL, I recommend close at-home surveillance with plans to proceed with cystoscopy and TRUS as scheduled next week.  I counseled the patient on signs and symptoms of gross hematuria requiring urgent evaluation, including thick, ketchup-like urinary output; passage of large clots; dark, maroon-colored urine; lower abdominal pain; lumbar pain; abdominal distention; and the cessation of drainage from his Foley catheter.  I advised him to contact the office for assistance if he develops these symptoms during routine office hours, 8 AM to 5 PM Monday through Friday.  If outside those hours, I advised him to proceed to the emergency department. He expressed understanding.  - CBC; Future  Return if symptoms worsen or fail to improve.  Debroah Loop, PA-C  [facility] [address], [place], [place] [address] [phone]

## 2019-08-04 NOTE — Progress Notes (Signed)
Addendum:  Patient stat CBC has resulted.  Hemoglobin and hematocrit WNL.  I contacted the patient via telephone to report that his blood count is normal and no further intervention is needed at this time.  I reiterated that I would like him to call us or proceed to the ED if he develops concerning signs of gross hematuria, as previously described.  He expressed understanding.  CBC Latest Ref Rng & Units 08/04/2019 03/13/2019 11/03/2018  WBC 4.0 - 10.5 K/uL 8.7 8.5 8.1  Hemoglobin 13.0 - 17.0 g/dL 13.7 15.3 15.4  Hematocrit 39.0 - 52.0 % 42.3 45.8 45.5  Platelets 150 - 400 K/uL 240 196.0 187.0

## 2019-08-04 NOTE — Telephone Encounter (Signed)
Patient called stating that he is still having blood in his bag. Blood is not thick or clotted but is now dark red. He denies fatigue, SOB, fever or chills. Per Sam patient was added on to her schedule today to increase foley balloon size and check H &H

## 2019-08-04 NOTE — Progress Notes (Signed)
Bladder Irrigation  Due to gross hematuria patient is present today for a bladder irrigation. Patient was cleaned and prepped for the procedure.  20 cc of sterile water was instilled into his existing 16Fr coud Foley catheter balloon, for a total of 30 cc contained within the balloon.  The catheter was placed on significant tension to tamponade the prostate.  400 ml of sterile water was subsequently irrigated into the bladder with a 51ml [place] syringe through the catheter in place.  With irrigation and prostatic tamponade, urine flow was noted to clear from Mer[place] to dark pink in color.  No clot material was visualized within the urine.  A new leg bag was attached to the catheter for drainage and the catheter was secured on tension to his anterior left thigh with a StatLock to maintain prostatic pressure.  Patient tolerated well.   Performed by: Debroah Loop, PA-C and Gordy Clement, [place]

## 2019-08-07 ENCOUNTER — Other Ambulatory Visit: Payer: Medicare Other | Admitting: Urology

## 2019-08-10 ENCOUNTER — Ambulatory Visit: Payer: Medicare Other | Admitting: Urology

## 2019-08-10 ENCOUNTER — Other Ambulatory Visit: Payer: Self-pay

## 2019-08-10 ENCOUNTER — Encounter: Payer: Self-pay | Admitting: Urology

## 2019-08-10 VITALS — BP 167/93 | HR 96 | Ht 72.0 in | Wt 248.0 lb

## 2019-08-10 DIAGNOSIS — R310 Gross hematuria: Secondary | ICD-10-CM | POA: Diagnosis not present

## 2019-08-10 NOTE — Progress Notes (Signed)
Transrectal ultrasound prostate  Indication: BPH with urinary retention Description: Patient was placed in the left lateral decubitus position.  A 8.0 MHz transrectal ultrasound probe was lubricated and gently inserted per rectum.  The prostate was imaged in transverse and sagittal views.  There is marked transition zone enlargement.    Dimensions: Height: 4.95 cm Width: 5.70 Weight: 6.67  Volume: 98.54 cc

## 2019-08-10 NOTE — Progress Notes (Signed)
Simple Catheter Placement  Due to urinary retention patient is present today for a foley cath placement.  Patient was cleaned and prepped in a sterile fashion with betadine. A 16 coudle FR foley catheter was inserted, urine return was noted  53ml, urine was red in color.  The balloon was filled with 10cc of sterile water.  A leg bag was attached for drainage.   Patient was given instruction on proper catheter care.  Patient tolerated well, no complications were noted   Performed by: Gaspar Cola Cma

## 2019-08-10 NOTE — Progress Notes (Signed)
   08/10/19  CC: No chief complaint on file.   HPI: Recurrent urinary retention.  Has failed voiding trial x2 .  Refer to Thomas Hoff previous notes for details  Blood pressure (!) 167/93, pulse 96, height 6' (1.829 m), weight 248 lb (112.5 kg). NED. A&Ox3.   No respiratory distress   Abd soft, NT, ND Normal phallus with bilateral descended testicles  Cystoscopy Procedure Note  Patient identification was confirmed, informed consent was obtained, and patient was prepped using Betadine solution.  Lidocaine jelly was administered per urethral meatus.     Pre-Procedure: - Inspection reveals a normal caliber urethral meatus with slight erosion.  Procedure: The flexible cystoscope was introduced without difficulty - No urethral strictures/lesions are present. - Lateral lobe enlargement prostate  - Moderate elevation bladder neck - Bilateral ureteral orifices not identified - Bladder mucosa  reveals inflammatory changes bladder base secondary to indwelling Foley.  Visualization suboptimal due to sediment. - No bladder stones -Moderate trabeculation  Retroflexion shows suboptimal visualization on retroflex due to sediment.  Post-Procedure: - Patient tolerated the procedure well  Assessment/ Plan: -Marked BPH with urinary retention  -Management options were discussed including outlet procedures.  Minimal invasive options were discussed including Rezum.  He would not be a candidate for UroLift secondary to prostate volume.  -Holep was also discussed.  He was primarily interested in this procedure and will schedule with Dr. Erlene Quan or Dr. Diamantina Providence.  -He will need a cardiology clearance    Abbie Sons, MD

## 2019-08-11 ENCOUNTER — Emergency Department
Admission: EM | Admit: 2019-08-11 | Discharge: 2019-08-11 | Disposition: A | Discharge status: Home / Self Care | Payer: Medicare Other | Attending: Emergency Medicine | Admitting: Emergency Medicine

## 2019-08-11 ENCOUNTER — Encounter: Payer: Self-pay | Admitting: Physician Assistant

## 2019-08-11 ENCOUNTER — Ambulatory Visit (INDEPENDENT_AMBULATORY_CARE_PROVIDER_SITE_OTHER): Payer: Medicare Other | Admitting: Physician Assistant

## 2019-08-11 ENCOUNTER — Other Ambulatory Visit: Payer: Self-pay

## 2019-08-11 VITALS — BP 150/80 | HR 86 | Ht 72.0 in | Wt 245.0 lb

## 2019-08-11 DIAGNOSIS — R339 Retention of urine, unspecified: Secondary | ICD-10-CM | POA: Diagnosis present

## 2019-08-11 DIAGNOSIS — I4891 Unspecified atrial fibrillation: Secondary | ICD-10-CM | POA: Insufficient documentation

## 2019-08-11 DIAGNOSIS — Y69 Unspecified misadventure during surgical and medical care: Secondary | ICD-10-CM | POA: Diagnosis not present

## 2019-08-11 DIAGNOSIS — I10 Essential (primary) hypertension: Secondary | ICD-10-CM | POA: Diagnosis not present

## 2019-08-11 DIAGNOSIS — T83091A Other mechanical complication of indwelling urethral catheter, initial encounter: Secondary | ICD-10-CM | POA: Insufficient documentation

## 2019-08-11 DIAGNOSIS — R310 Gross hematuria: Secondary | ICD-10-CM | POA: Insufficient documentation

## 2019-08-11 DIAGNOSIS — R338 Other retention of urine: Secondary | ICD-10-CM | POA: Diagnosis not present

## 2019-08-11 DIAGNOSIS — N400 Enlarged prostate without lower urinary tract symptoms: Secondary | ICD-10-CM | POA: Insufficient documentation

## 2019-08-11 DIAGNOSIS — Z7901 Long term (current) use of anticoagulants: Secondary | ICD-10-CM | POA: Insufficient documentation

## 2019-08-11 DIAGNOSIS — R1030 Lower abdominal pain, unspecified: Secondary | ICD-10-CM | POA: Insufficient documentation

## 2019-08-11 LAB — URINALYSIS, COMPLETE (UACMP) WITH MICROSCOPIC
Bacteria, UA: NONE SEEN
RBC / HPF: >50 RBC/hpf — ABNORMAL HIGH (ref 0–5)
Specific Gravity, Urine: 1.02 (ref 1.005–1.030)
Squamous Epithelial / HPF: NONE SEEN (ref 0–5)
WBC, UA: NONE SEEN WBC/hpf (ref 0–5)

## 2019-08-11 LAB — CBC WITH DIFFERENTIAL/PLATELET
Abs Immature Granulocytes: 0.03 10*3/uL (ref 0.00–0.07)
Basophils Absolute: 0.1 10*3/uL (ref 0.0–0.1)
Basophils Relative: 1 %
Eosinophils Absolute: 0.2 10*3/uL (ref 0.0–0.5)
Eosinophils Relative: 2 %
HCT: 44 % (ref 39.0–52.0)
Hemoglobin: 14.4 g/dL (ref 13.0–17.0)
Immature Granulocytes: 0 %
Lymphocytes Relative: 9 %
Lymphs Abs: 0.9 10*3/uL (ref 0.7–4.0)
MCH: 29.1 pg (ref 26.0–34.0)
MCHC: 32.7 g/dL (ref 30.0–36.0)
MCV: 88.9 fL (ref 80.0–100.0)
Monocytes Absolute: 0.9 10*3/uL (ref 0.1–1.0)
Monocytes Relative: 9 %
Neutro Abs: 7.7 10*3/uL (ref 1.7–7.7)
Neutrophils Relative %: 79 %
Platelets: 204 10*3/uL (ref 150–400)
RBC: 4.95 MIL/uL (ref 4.22–5.81)
RDW: 13.5 % (ref 11.5–15.5)
WBC: 9.8 10*3/uL (ref 4.0–10.5)
nRBC: 0 % (ref 0.0–0.2)

## 2019-08-11 LAB — BASIC METABOLIC PANEL
Anion gap: 8 (ref 5–15)
BUN: 18 mg/dL (ref 8–23)
CO2: 28 mmol/L (ref 22–32)
Calcium: 8.8 mg/dL — ABNORMAL LOW (ref 8.9–10.3)
Chloride: 105 mmol/L (ref 98–111)
Creatinine, Ser: 0.98 mg/dL (ref 0.61–1.24)
GFR calc Af Amer: >60 mL/min (ref >60)
GFR calc non Af Amer: >60 mL/min (ref >60)
Glucose, Bld: 202 mg/dL — ABNORMAL HIGH (ref 70–99)
Potassium: 4.9 mmol/L (ref 3.5–5.1)
Sodium: 141 mmol/L (ref 135–145)

## 2019-08-11 MED ORDER — LIDOCAINE HCL URETHRAL/MUCOSAL 2 % EX GEL
1.0000 "application " | Freq: Once | CUTANEOUS | Status: AC
  Administered 2019-08-11: 1 via URETHRAL

## 2019-08-11 MED ORDER — CEPHALEXIN 500 MG PO CAPS
500.0000 mg | ORAL_CAPSULE | Freq: Once | ORAL | Status: AC
  Administered 2019-08-11: 500 mg via ORAL
  Filled 2019-08-11: qty 1

## 2019-08-11 MED ORDER — CEPHALEXIN 500 MG PO CAPS: 500.0000 mg | ORAL_CAPSULE | Freq: Three times a day (TID) | ORAL | 0 refills | Status: AC

## 2019-08-11 MED ORDER — CEPHALEXIN 500 MG PO CAPS: 500.0000 mg | ORAL_CAPSULE | Freq: Three times a day (TID) | ORAL | 0 refills | Status: DC

## 2019-08-11 NOTE — ED Notes (Signed)
Reviewed discharge instructions, follow-up care, and prescriptions with patient. Patient verbalized understanding of all information reviewed. Patient stable, with no distress noted at this time.    

## 2019-08-11 NOTE — ED Provider Notes (Signed)
[facility] Emergency Department Provider Note  ____________________________________________  Time seen: Approximately 3:35 AM  I have reviewed the triage vital signs and the nursing notes.   HISTORY  Chief Complaint Urinary Retention   HPI Jonathan York is a 86 y.o. male with a history of BPH, A. fib on Eliquis, diabetes, hypertension, hyperlipidemia who presents for evaluation of malfunctioning Foley catheter.  Patient has had a Foley catheter since Christmas time.  Has had it exchanged earlier today.  Since the new catheter was placed, patient has not drained any urine.  Is complaining of pain in his suprapubic region and severe pressure that is becoming progressively worse throughout the evening.  He has noticed some bleeding around the meatus.  No fever or chills, no nausea or vomiting.  His pain is now 10 out of 10.   Past Medical History:  Diagnosis Date  . Abdominal bruit 05/05/2012  . Arthritis    back pain, past lumbar surgery   . BPH (benign prostatic hypertrophy)   . Carotid arterial disease (HCC)    a. s/p left-sided CEA  . Chronic atrial fibrillation ([place])    a. CHADS2VASc => 5 (HTN, age x 2, DM, vascular disease); Eliquis  . Diabetes mellitus type II    diet controlled   . HLD (hyperlipidemia)   . HTN (hypertension)   . Lung cancer ([place])    a. s/p resection and chemo in 2002  . TIA (transient ischemic attack) 2019    Patient Active Problem List   Diagnosis Date Noted  . Urinary retention 07/12/2019  . Nail dystrophy 04/03/2019  . Coagulation disorder ([place]) 04/03/2019  . Healthcare maintenance 03/19/2019  . Short of breath on exertion 11/04/2018  . Pedal edema 06/10/2018  . Carotid stenosis 04/25/2018  . Imbalance   . Stenosis of right carotid artery   . History of CEA (carotid endarterectomy)   . Diabetes mellitus ([place])   . CVA (cerebral vascular accident) ([place]) 02/13/2018  . Diabetic retinopathy ([place]) 10/11/2017  .  Constipation 02/18/2017  . GERD (gastroesophageal reflux disease) 01/21/2017  . Lower urinary tract symptoms (LUTS) 01/02/2015  . Advance care planning 01/25/2014  . Back pain 01/25/2013  . Second degree AV block, Mobitz type I 09/18/2012  . A-fib ([place]) 05/05/2012  . Left carotid bruit 05/05/2012  . Abdominal bruit 05/05/2012  . Skin lesion 01/26/2012  . Cough 12/18/2011  . Medicare annual wellness visit, subsequent 11/03/2011  . Vertigo 11/02/2011  . Hyperlipidemia with target LDL less than 100 11/19/2010  . CHRONIC OBSTRUCTIVE PULMONARY DISEASE 01/01/2010  . 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 RESTLESS LEG SYNDROME 07/01/2009  . Essential hypertension 03/17/2007  . HEMORRHOIDS 03/17/2007  . BENIGN PROSTATIC HYPERTROPHY 03/17/2007  . DEGENERATIVE JOINT DISEASE, KNEES, BILATERAL 03/17/2007  . 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 ARCH AORTOGRAM  05/27/2012   Procedure: ARCH AORTOGRAM;  Surgeon: Elam Dutch, MD;  Location: [facility] CATH LAB;  Service: Cardiovascular;;  . CARDIOVERSION  06/29/2012   Procedure: CARDIOVERSION;  Surgeon: Thayer Headings, MD;  Location: [facility] ENDOSCOPY;  Service: Cardiovascular;  Laterality: N/A;  . CAROTID ANGIOGRAM N/A 05/27/2012   Procedure: CAROTID Cyril Loosen;  Surgeon: Elam Dutch, MD;  Location: [facility] CATH LAB;  Service: Cardiovascular;  Laterality: N/A;  . CAROTID ENDARTERECTOMY Left 05/30/12  . CATARACT EXTRACTION     right  . ENDARTERECTOMY  05/30/2012   Procedure: ENDARTERECTOMY CAROTID;  Surgeon: Elam Dutch, MD;  Location: [facility] OR;  Service: Vascular;  Laterality: Left;  . ENDARTERECTOMY Right 04/25/2018   Procedure: ENDARTERECTOMY RIGHT CAROTID;  Surgeon: Elam Dutch, MD;  Location: [place];  Service: Vascular;  Laterality: Right;  . EYE SURGERY     Jonathan detached retina- 1990's, IOL implants also   . [place]  . JOINT REPLACEMENT     knee- bilateral- 2009 and  2012  . LOBECTOMY  03/15/01   [facility] Hockey)  . NOSE SURGERY    . PATCH ANGIOPLASTY Right 04/25/2018   Procedure: PATCH ANGIOPLASTY RIGHT CAROTID ARTERY USING HEMASHILED PLATINUM FINESSE PATCH;  Surgeon: Elam Dutch, MD;  Location: ;  Service: Vascular;  Laterality: Right;  . RETINAL DETACHMENT SURGERY     right  . SPINE SURGERY  approx 2006   Ruptered Disc  . TONSILLECTOMY     1939  . TOTAL KNEE ARTHROPLASTY     x2 per Dr. Rush Farmer  . WEDGE RESECTION  03/15/01   [facility] Hockey)    Prior to Admission medications   Medication Sig Start Date End Date Taking? Authorizing Provider  amLODipine (NORVASC) 5 MG tablet Take 1 tablet by mouth once daily 01/26/19  Yes Tonia Ghent, MD  atorvastatin (LIPITOR) 80 MG tablet TAKE 1 TABLET BY MOUTH AT BEDTIME 04/18/18  Yes Tonia Ghent, MD  ELIQUIS 5 MG TABS tablet Take 1 tablet by mouth twice daily 01/26/19  Yes Wellington Hampshire, MD  fish oil-omega-3 fatty acids 1000 MG capsule Take 1 g by mouth 3 (three) times daily.    Yes [provider]  furosemide (LASIX) 20 MG tablet Take 1-2 tablets (20-40 mg total) by mouth daily as needed for fluid. 12/07/18  Yes Tonia Ghent, MD  metFORMIN (GLUCOPHAGE) 500 MG tablet Take 1 tablet (500 mg total) by mouth 2 (two) times daily with a meal. 03/17/19  Yes Tonia Ghent, MD  methocarbamol (ROBAXIN) 500 MG tablet TAKE 1 TABLET BY MOUTH AT BEDTIME AS NEEDED FOR  RESTLESS  LEGS 07/05/18  Yes Tonia Ghent, MD  Multiple Vitamins-Minerals (PRESERVISION AREDS 2 PO) Take 1 Can by mouth 2 (two) times daily.    Yes [provider]  Polyethylene Glycol 3350 (MIRALAX PO) Take 17 g by mouth as needed.   Yes [provider]  ramipril (ALTACE) 10 MG capsule Take 1 capsule (10 mg total) by mouth daily. 01/04/19  Yes Wellington Hampshire, MD  tamsulosin (FLOMAX) 0.4 MG CAPS capsule Take 1 capsule by mouth once daily 04/05/19  Yes Tonia Ghent, MD  cephALEXin (KEFLEX) 500 MG  capsule Take 1 capsule (500 mg total) by mouth 3 (three) times daily for 7 days. 08/11/19 08/18/19  Rudene Re, MD  Fluticasone-Umeclidin-Vilant (TRELEGY ELLIPTA) 100-62.5-25 MCG/INH AEPB Inhale 1 applicator into the lungs daily. Rinse mouth after use. Patient not taking: Reported on 08/11/2019 03/22/19   Laverle Hobby, MD    Allergies Xarelto [rivaroxaban]  Family History  Problem Relation Age of Onset  . Emphysema Father   . Dementia Mother   . Diabetes Mother   . Hypertension Mother   . Hypothyroidism Sister   . Dementia Brother   . Hypertension Brother   . Diabetes Son   . Heart disease Son        Heart Disease before age 60- Open Heart 2010  . Hypertension Son   . Stroke Son        while on coumadin  . Prostate cancer Neg Hx   .  Colon cancer Neg Hx     Social History Social History   Tobacco Use  . Smoking status: Former Smoker    Packs/day: 1.00    Years: 32.00    Pack years: 32.00    Types: Cigarettes    Quit date: 09/03/1982    Years since quitting: 36.9  . Smokeless tobacco: Never Used  Substance Use Topics  . Alcohol use: No    Alcohol/week: 0.0 standard drinks    Comment:    . Drug use: No    Review of Systems  Constitutional: Negative for fever. Eyes: Negative for visual changes. ENT: Negative for sore throat. Neck: No neck pain  Cardiovascular: Negative for chest pain. Respiratory: Negative for shortness of breath. Gastrointestinal: + abdominal pain. No vomiting or diarrhea. Genitourinary: Negative for dysuria. + urinary retention Musculoskeletal: Negative for back pain. Skin: Negative for rash. Neurological: Negative for headaches, weakness or numbness. Psych: No SI or HI  ____________________________________________   PHYSICAL EXAM:  VITAL SIGNS: ED Triage Vitals  Enc Vitals Group     BP 08/11/19 0244 (!) 205/103     Pulse Rate 08/11/19 0242 95     Resp 08/11/19 0242 20     Temp 08/11/19 0242 97.7 F (36.5 C)     Temp  Source 08/11/19 0242 Oral     SpO2 08/11/19 0242 97 %     Weight 08/11/19 0243 245 lb (111.1 kg)     Height --      Head Circumference --      Peak Flow --      Pain Score 08/11/19 0243 10     Pain Loc --      Pain Edu? --      Excl. in [place]? --     Constitutional: Alert and oriented, uncomfortable due to pain.  HEENT:      Head: Normocephalic and atraumatic.         Eyes: Conjunctivae are normal. Sclera is non-icteric.       Mouth/Throat: Mucous membranes are moist.       Neck: Supple with no signs of meningismus. Cardiovascular: Regular rate and rhythm. No murmurs, gallops, or rubs.  Respiratory: Normal respiratory effort. Lungs are clear to auscultation bilaterally. No wheezes, crackles, or rhonchi.  Gastrointestinal: Soft, distended bladder, and non distended with positive bowel sounds. No rebound or guarding. Genitourinary: No CVA tenderness. Musculoskeletal: Nontender with normal range of motion in all extremities. No edema, cyanosis, or erythema of extremities. Neurologic: Normal speech and language. Face is symmetric. Moving all extremities. No gross focal neurologic deficits are appreciated. Skin: Skin is warm, dry and intact. No rash noted. Psychiatric: Mood and affect are normal. Speech and behavior are normal.  ____________________________________________   LABS (all labs ordered are listed, but only abnormal results are displayed)  Labs Reviewed  URINALYSIS, COMPLETE (UACMP) WITH MICROSCOPIC - Abnormal; Notable for the following components:      Result Value   Color, Urine RED (*)    APPearance TURBID (*)    Glucose, UA   (*)    Value: TEST NOT REPORTED DUE TO COLOR INTERFERENCE OF URINE PIGMENT   Hgb urine dipstick   (*)    Value: TEST NOT REPORTED DUE TO COLOR INTERFERENCE OF URINE PIGMENT   Bilirubin Urine   (*)    Value: TEST NOT REPORTED DUE TO COLOR INTERFERENCE OF URINE PIGMENT   Ketones, ur   (*)    Value: TEST NOT REPORTED DUE TO COLOR INTERFERENCE OF  URINE PIGMENT   Protein, ur   (*)    Value: TEST NOT REPORTED DUE TO COLOR INTERFERENCE OF URINE PIGMENT   Nitrite   (*)    Value: TEST NOT REPORTED DUE TO COLOR INTERFERENCE OF URINE PIGMENT   Leukocytes,Ua   (*)    Value: TEST NOT REPORTED DUE TO COLOR INTERFERENCE OF URINE PIGMENT   RBC / HPF >50 (*)    All other components within normal limits  BASIC METABOLIC PANEL - Abnormal; Notable for the following components:   Glucose, Bld 202 (*)    Calcium 8.8 (*)    All other components within normal limits  URINE CULTURE  CBC WITH DIFFERENTIAL/PLATELET   ____________________________________________  EKG  none  ____________________________________________  RADIOLOGY  none  ____________________________________________   PROCEDURES  Procedure(s) performed: None Procedures Critical Care performed:  None ____________________________________________   INITIAL IMPRESSION / ASSESSMENT AND PLAN / ED COURSE  86 y.o. male with a history of BPH, A. fib on Eliquis, diabetes, hypertension, hyperlipidemia who presents for evaluation of malfunctioning Foley catheter.  Patient has a catheter changed earlier today and no urine has been draining ever since.  There is some bleeding around the meatus.  Bladder scan showing greater than 500 cc.  I deflated the balloon and advanced the catheter, reinflated the balloon with immediate urine return. Patient with 500cc of urine output. Gross hematuria. Culture sent. Labs showed no signs of sepsis or UTI. Full resolution of his symptoms immediately. Will monitor for any signs of recurrence of retention due to clot.  _________________________ 5:57 AM on 08/11/2019 -----------------------------------------  Foley catheter again became obstructed. Foley exchange for a 18 coude catheter and bladder was irrigated.  Patient started on Keflex to prevent urinary tract infection.  Discussed Foley care and recommended contacting his urologist this morning.   Discussed my standard return precautions.       As part of my medical decision making, I reviewed the following data within the [place] notes reviewed and incorporated, Labs reviewed , Old chart reviewed, Notes from prior ED visits and  Controlled Substance Database   Please note:  Patient was evaluated in Emergency Department today for the symptoms described in the history of present illness. Patient was evaluated in the context of the global COVID-19 pandemic, which necessitated consideration that the patient might be at risk for infection with the SARS-CoV-2 virus that causes COVID-19. Institutional protocols and algorithms that pertain to the evaluation of patients at risk for COVID-19 are in a state of rapid change based on information released by regulatory bodies including the CDC and federal and state organizations. These policies and algorithms were followed during the patient's care in the ED.  Some ED evaluations and interventions may be delayed as a result of limited staffing during the pandemic.   ____________________________________________   FINAL CLINICAL IMPRESSION(S) / ED DIAGNOSES   Final diagnoses:  Obstruction of Foley catheter, initial encounter ([place])  Gross hematuria      NEW MEDICATIONS STARTED DURING THIS VISIT:  ED Discharge Orders         Ordered    cephALEXin (KEFLEX) 500 MG capsule  3 times daily     08/11/19 0556           Note:  This document was prepared using Dragon voice recognition software and may include unintentional dictation errors.    Alfred Levins, [place], MD 08/11/19 [phone]

## 2019-08-11 NOTE — Progress Notes (Signed)
Bladder Irrigation  Due to clot retention patient is present today for a bladder irrigation. Patient was cleaned and prepped in a sterile fashion. 500 ml of sterile water was instilled and irrigated into the bladder with a 21ml [place] syringe through the 24Fr three-way hematuria catheter in place.  Urine flow was noted 519mL, pink, with <46mL clot material visualized.Catheter is now draining. Patient tolerated well.   Performed by: Debroah Loop, PA-C

## 2019-08-11 NOTE — Progress Notes (Signed)
08/11/2019 3:05 PM   Jonathan York Oct 03, 1932 [id number]  CC: Bleeding around catheter, catheter not draining  HPI: Jonathan York is a 86 y.o. male who presents today for evaluation of Foley catheter problems.  PMH significant for A. fib on Eliquis.  He is currently catheter dependent secondary to BPH with urinary retention.  He developed gross hematuria approximately 1 week ago that was managed in clinic by me with bladder irrigation and overfilling of his catheter balloon to provide prostatic pressure.  He reports his gross hematuria subsequently resolved.    He underwent cystoscopy and TRUS with Dr. Bernardo Heater yesterday with replacement of a 16FR coud Foley catheter.  He reports that he developed gross hematuria following catheter placement and that his catheter stopped draining overnight.  He presented to the ED early this morning with complaints of a full bladder and a Foley catheter that was not draining.  In the ED, his catheter was advanced with drainage of 549mL of urine, gross hematuria noted.  Foley catheter became obstructed again and was ultimately exchanged for an 18FR coud.  His bladder was irrigated and he was discharged on Keflex 500 mg 3 times daily x7 days for UTI prevention.  ED labs notable for stable hemoglobin, creatinine, and UA with no WBCs or bacteria seen.  Urine culture pending.  He presented to clinic this morning as a same-day add-on with reports that his catheter had not drained since his discharge from the emergency department.  He reported bleeding from around the catheter and lower abdominal pain.  PMH: Past Medical History:  Diagnosis Date  . Abdominal bruit 05/05/2012  . Arthritis    back pain, past lumbar surgery   . BPH (benign prostatic hypertrophy)   . Carotid arterial disease (HCC)    a. s/p left-sided CEA  . Chronic atrial fibrillation ([place])    a. CHADS2VASc => 5 (HTN, age x 2, DM, vascular disease); Eliquis  . Diabetes mellitus type II    diet  controlled   . HLD (hyperlipidemia)   . HTN (hypertension)   . Lung cancer ([place])    a. s/p resection and chemo in 2002  . TIA (transient ischemic attack) 2019    Surgical History: Past Surgical History:  Procedure Laterality Date  . ARCH AORTOGRAM  05/27/2012   Procedure: ARCH AORTOGRAM;  Surgeon: Elam Dutch, MD;  Location: [facility] CATH LAB;  Service: Cardiovascular;;  . CARDIOVERSION  06/29/2012   Procedure: CARDIOVERSION;  Surgeon: Thayer Headings, MD;  Location: [facility] ENDOSCOPY;  Service: Cardiovascular;  Laterality: N/A;  . CAROTID ANGIOGRAM N/A 05/27/2012   Procedure: CAROTID Cyril Loosen;  Surgeon: Elam Dutch, MD;  Location: [facility] CATH LAB;  Service: Cardiovascular;  Laterality: N/A;  . CAROTID ENDARTERECTOMY Left 05/30/12  . CATARACT EXTRACTION     right  . ENDARTERECTOMY  05/30/2012   Procedure: ENDARTERECTOMY CAROTID;  Surgeon: Elam Dutch, MD;  Location: [place];  Service: Vascular;  Laterality: Left;  . ENDARTERECTOMY Right 04/25/2018   Procedure: ENDARTERECTOMY RIGHT CAROTID;  Surgeon: Elam Dutch, MD;  Location: [place];  Service: Vascular;  Laterality: Right;  . EYE SURGERY     Jonathan detached retina- 1990's, IOL implants also   . [name]  . JOINT REPLACEMENT     knee- bilateral- 2009 and 2012  . LOBECTOMY  03/15/01   [facility] R[facility] Hockey)  . NOSE SURGERY    . PATCH ANGIOPLASTY Right 04/25/2018   Procedure: PATCH ANGIOPLASTY RIGHT CAROTID ARTERY USING  HEMASHILED PLATINUM FINESSE PATCH;  Surgeon: Elam Dutch, MD;  Location: [facility] OR;  Service: Vascular;  Laterality: Right;  . RETINAL DETACHMENT SURGERY     right  . SPINE SURGERY  approx 2006   Ruptered Disc  . TONSILLECTOMY     1939  . TOTAL KNEE ARTHROPLASTY     x2 per Dr. Rush Farmer  . WEDGE RESECTION  03/15/01   [facility] Hockey)    Home Medications:  Allergies as of 08/11/2019      Reactions   Xarelto [rivaroxaban] Other (See Comments)   vertigo      Medication List       Accurate as of  August 11, 2019  3:05 PM. If you have any questions, ask your nurse or doctor.        STOP taking these medications   cephALEXin 500 MG capsule Commonly known as: KEFLEX Stopped by: Debroah Loop, PA-C     TAKE these medications   amLODipine 5 MG tablet Commonly known as: NORVASC Take 1 tablet by mouth once daily   atorvastatin 80 MG tablet Commonly known as: LIPITOR TAKE 1 TABLET BY MOUTH AT BEDTIME   Eliquis 5 MG Tabs tablet Generic drug: apixaban Take 1 tablet by mouth twice daily   fish oil-omega-3 fatty acids 1000 MG capsule Take 1 g by mouth 3 (three) times daily.   furosemide 20 MG tablet Commonly known as: LASIX Take 1-2 tablets (20-40 mg total) by mouth daily as needed for fluid.   metFORMIN 500 MG tablet Commonly known as: GLUCOPHAGE Take 1 tablet (500 mg total) by mouth 2 (two) times daily with a meal.   methocarbamol 500 MG tablet Commonly known as: ROBAXIN TAKE 1 TABLET BY MOUTH AT BEDTIME AS NEEDED FOR  RESTLESS  LEGS   MIRALAX PO Take 17 g by mouth as needed.   PRESERVISION AREDS 2 PO Take 1 Can by mouth 2 (two) times daily.   ramipril 10 MG capsule Commonly known as: ALTACE Take 1 capsule (10 mg total) by mouth daily.   tamsulosin 0.4 MG Caps capsule Commonly known as: FLOMAX Take 1 capsule by mouth once daily   Trelegy Ellipta 100-62.5-25 MCG/INH Aepb Generic drug: Fluticasone-Umeclidin-Vilant Inhale 1 applicator into the lungs daily. Rinse mouth after use.       Allergies:  Allergies  Allergen Reactions  . Xarelto [Rivaroxaban] Other (See Comments)    vertigo    Family History: Family History  Problem Relation Age of Onset  . Emphysema Father   . Dementia Mother   . Diabetes Mother   . Hypertension Mother   . Hypothyroidism Sister   . Dementia Brother   . Hypertension Brother   . Diabetes Son   . Heart disease Son        Heart Disease before age 62- Open Heart 2010  . Hypertension Son   . Stroke Son         while on coumadin  . Prostate cancer Neg Hx   . Colon cancer Neg Hx     Social History:   reports that he quit smoking about 36 years ago. His smoking use included cigarettes. He has a 32.00 pack-year smoking history. He has never used smokeless tobacco. He reports that he does not drink alcohol or use drugs.  ROS: UROLOGY Frequent Urination?: No Hard to postpone urination?: No Burning/pain with urination?: No Get up at night to urinate?: No Leakage of urine?: No Urine stream starts and stops?: No Trouble starting stream?: No  Do you have to strain to urinate?: Yes Blood in urine?: Yes Urinary tract infection?: No Sexually transmitted disease?: No Injury to kidneys or bladder?: No Painful intercourse?: No Weak stream?: No Erection problems?: No Penile pain?: No  Gastrointestinal Nausea?: No Vomiting?: No Indigestion/heartburn?: No Diarrhea?: No Constipation?: No  Constitutional Fever: No Night sweats?: No Weight loss?: No Fatigue?: No  Skin Skin rash/lesions?: No Itching?: No  Eyes Blurred vision?: No Double vision?: No  Ears/Nose/Throat Sore throat?: No Sinus problems?: No  Hematologic/Lymphatic Swollen glands?: No Easy bruising?: No  Cardiovascular Leg swelling?: No Chest pain?: No  Respiratory Cough?: No Shortness of breath?: No  Endocrine Excessive thirst?: No  Musculoskeletal Back pain?: No Joint pain?: No  Neurological Headaches?: No Dizziness?: No  Psychologic Depression?: No Anxiety?: No  Physical Exam: BP (!) 150/80   Pulse 86   Ht 6' (1.829 m)   Wt 245 lb (111.1 kg)   BMI 33.23 kg/m   Constitutional:  Alert and oriented, no acute distress, nontoxic appearing HEENT: [place], AT Cardiovascular: No clubbing, cyanosis, or edema Respiratory: Normal respiratory effort, no increased work of breathing GI: Abdomen is soft, nontender, nondistended, no abdominal masses GU: Uncircumcised penis. 18FR Foley catheter in place, leg bag  empty.  Clot material seen protruding from the drainage tubing at the anterior portion of the leg bag.  Dried blood seen over the bilateral thighs and penis. Lymph: No cervical or inguinal lymphadenopathy Skin: No rashes, bruises or suspicious lesions Neurologic: Grossly intact, no focal deficits, moving all 4 extremities Psychiatric: Normal mood and affect  Assessment & Plan:   86 year old male with PMH A. fib on Eliquis presents today with recurrent clot retention in the setting of Foley dependent urinary retention, most recently seen in the emergency department overnight. 1. Clot retention of urine Foley catheter not draining upon presentation today, with clot material visible protruding from his drainage tubing into his leg bag.  I elected to place a 24 [place] three-way hematuria catheter to allow for thorough irrigation in clinic today.  See separate procedure note for further details.  I was ultimately able to evacuate old appearing blood and clot material from his bladder; catheter was draining clear, pink urine at the time of his departure from clinic.  I overfilled his catheter balloon and placed the catheter on tension again today to tamponade the prostate.  ED UA was reassuring for infection, but he has now been irrigated twice in 1 day. I will prescribe Keflex 500 mg 3 times daily x1 day for UTI prevention and cancel the previously placed ED order for Keflex 3 times daily x7 days.  He expressed understanding.  I counseled the patient on signs and symptoms of gross hematuria requiring urgent evaluation, including thick, ketchup-like urinary output; passage of large clots; dark, maroon-colored urine; lower abdominal pain; lumbar pain; abdominal distention; and the cessation of drainage from his Foley catheter.  I advised him to contact the office for assistance if he develops these symptoms during routine office hours, 8 AM to 5 PM Monday through Friday.  If outside those hours, I advised  him to proceed to the emergency department. He expressed understanding.   2. Hematuria, gross Likely multifactorial, suspect BPH and anticoagulation as cause.  On Eliquis; Hgb stable.  Urine cleared from [facility] to pink in office today.  See separate procedure note for further details.  I counseled the patient to hold his Eliquis if his urine becomes darker than pink in color and to contact our office  if he holds it for longer than 2 days.  He expressed understanding.  We called the patient this afternoon to check on him.  He reports light pink urinary output and that his catheter continues to drain.   Return if symptoms worsen or fail to improve.  Debroah Loop, PA-C  [facility] [address], [place], [place] [address] [phone]

## 2019-08-11 NOTE — Progress Notes (Signed)
Cath Change/ Replacement  Patient is present today for a catheter change due to catheter occlusion with clot material.  34ml of water was removed from the balloon, a 18FR coude foley cath was removed with out difficulty.  Clot material was visualized protruding from both ends of the catheter. Patient was cleaned and prepped in a sterile fashion with betadine and 2% lidocaine jelly was instilled into the urethra. A 24 FR three-way hematuria foley cath was replaced into the bladder no complications were noted Urine return was noted 341ml and urine was maroon in color with passage of <89mL clot material and urinary sediment. The balloon was filled with 75ml of sterile water. A leg bag was attached for drainage.  Patient was given proper instruction on catheter care.   Performed by: Debroah Loop, PA-C

## 2019-08-11 NOTE — ED Triage Notes (Signed)
Pt had catheter replaced Thursday due to large prostate yesterday but now it is not draining and feels like bladder is full.

## 2019-08-11 NOTE — ED Notes (Signed)
Bladder irrigated with 500 mL sterile water per MD. 500 mL instilled, 460 returned. MD informed. Patient reports relief of pain/discomfort.

## 2019-08-11 NOTE — Discharge Instructions (Addendum)

## 2019-08-13 ENCOUNTER — Other Ambulatory Visit: Payer: Self-pay | Admitting: Family Medicine

## 2019-08-13 ENCOUNTER — Encounter: Payer: Self-pay | Admitting: Urology

## 2019-08-13 LAB — URINE CULTURE: Culture: >=100000 — AB

## 2019-08-14 ENCOUNTER — Telehealth: Payer: Self-pay | Admitting: Cardiovascular Disease

## 2019-08-14 ENCOUNTER — Other Ambulatory Visit: Payer: Self-pay | Admitting: Radiology

## 2019-08-14 ENCOUNTER — Telehealth: Payer: Self-pay | Admitting: Urology

## 2019-08-14 DIAGNOSIS — R338 Other retention of urine: Secondary | ICD-10-CM

## 2019-08-14 DIAGNOSIS — N401 Enlarged prostate with lower urinary tract symptoms: Secondary | ICD-10-CM

## 2019-08-14 NOTE — Telephone Encounter (Signed)
Dr Tyrell Antonio office called and needs a new clearance sent. The one they have has surgery date of 08/11/19 and it is supposed to be 09/11/19.

## 2019-08-14 NOTE — Telephone Encounter (Signed)
   [facility] Pre-operative Risk Assessment    Request for surgical clearance:  1. What type of surgery is being performed? HOLMIUM LASER ENUCLEATION OF THE PROSTATE  2. When is this surgery scheduled? 09/11/2019  3. What type of clearance is required (medical clearance vs. Pharmacy clearance to hold med vs. Both)? BOTH  4. Are there any medications that need to be held prior to surgery and how long? ELIQUIS 3 DAYS PRIOR  5. Practice name and name of physician performing surgery? [facility]  6. What is your office phone number [phone]   7.   What is your office fax number [phone]  8.   Anesthesia type (None, local, MAC, general) ? NOT LISTED   Lucienne Minks 08/14/2019, 3:28 PM  _________________________________________________________________   (provider comments below)

## 2019-08-14 NOTE — Telephone Encounter (Signed)
Electronic refill request. Methocarbamol Last office visit:   07/10/2019 Last Filled:    50 tablet 0 07/05/2018  Please advise.

## 2019-08-14 NOTE — Telephone Encounter (Signed)
Pt has hx of chronic afib and is anticoagulated with Eliquis. Now having hematuria likely related to BPH and anticoagulation. He is planned for holmium laser enucleation of the prostate.  There is request to hold Eliquis for 3 days prior to procedure. I will route to our pharmacy for recommendations on anticoagulation.  Then will call pt to update his status for clearance for procedure.

## 2019-08-14 NOTE — Progress Notes (Signed)
ED Antimicrobial Stewardship Positive Culture Follow Up   Jonathan York is an 86 y.o. male who presented to [facility] on 08/11/2019 with a chief complaint of  Chief Complaint  Patient presents with  . Urinary Retention    Recent Results (from the past 720 hour(s))  Urine Culture     Status: Abnormal   Collection Time: 08/11/19  3:06 AM   Specimen: Urine, Random  Result Value Ref Range Status   Specimen Description   Final    URINE, RANDOM Performed at [facility], [address], [place], [place] [address]    Special Requests   Final    NONE Performed at [facility], [address] [place], [place] [address]    Culture (A)  Final    >=100,000 COLONIES/mL KLEBSIELLA OXYTOCA >=100,000 COLONIES/mL CITROBACTER FREUNDII    Report Status 08/13/2019 FINAL  Final   Organism ID, Bacteria KLEBSIELLA OXYTOCA (A)  Final   Organism ID, Bacteria CITROBACTER FREUNDII (A)  Final      Susceptibility   Citrobacter freundii - MIC*    CEFAZOLIN >=64 RESISTANT Resistant     CEFTRIAXONE <=0.25 SENSITIVE Sensitive     CIPROFLOXACIN <=0.25 SENSITIVE Sensitive     GENTAMICIN <=1 SENSITIVE Sensitive     IMIPENEM 0.5 SENSITIVE Sensitive     NITROFURANTOIN <=16 SENSITIVE Sensitive     TRIMETH/SULFA <=20 SENSITIVE Sensitive     PIP/TAZO <=4 SENSITIVE Sensitive     * >=100,000 COLONIES/mL CITROBACTER FREUNDII   Klebsiella oxytoca - MIC*    AMPICILLIN >=32 RESISTANT Resistant     CEFAZOLIN <=4 SENSITIVE Sensitive     CEFTRIAXONE <=0.25 SENSITIVE Sensitive     CIPROFLOXACIN <=0.25 SENSITIVE Sensitive     GENTAMICIN <=1 SENSITIVE Sensitive     IMIPENEM <=0.25 SENSITIVE Sensitive     NITROFURANTOIN <=16 SENSITIVE Sensitive     TRIMETH/SULFA <=20 SENSITIVE Sensitive     AMPICILLIN/SULBACTAM 8 SENSITIVE Sensitive     PIP/TAZO <=4 SENSITIVE Sensitive     * >=100,000 COLONIES/mL KLEBSIELLA OXYTOCA    [x]  Treated with Cephalexin, organism resistant to prescribed  antimicrobial  New antibiotic prescription: Cipro 500mg  bid x 7 days  ED Provider: Duffy Bruce  [address] and left prescription with [facility]  Lu Duffel, PharmD, BCPS Clinical Pharmacist 08/14/2019 2:38 PM

## 2019-08-14 NOTE — Telephone Encounter (Signed)
Patient with diagnosis of atrial fibrillation on Eliquis for anticoagulation.    Procedure: Holmium laser enucleation of prostate Date of procedure: 09/11/19  CHADS2-VASc score of  7 (HTN, AGE x 2, DM2, stroke/tia x 2, CAD)  CrCl 89.0 Platelet count 204  Pt suffered left pontine infarct in August 2019.  Surgeon requesting 3 days hold prior to procedure.  Would recommend using lovenox bridge prior to procedure and re-starting Eliquis as soon as safe afterwards, but will defer to Dr. Fletcher Anon for final decision.

## 2019-08-15 NOTE — Telephone Encounter (Signed)
Sent. Thanks.   

## 2019-08-16 NOTE — Telephone Encounter (Signed)
Dr. Erlene Quan, Is it possible to hold Eliquis for only 2 days instead of 3 days before prostate laser surgery?  He has normal creatinine clearance and the effects of Eliquis should be gone after 48 hours of holding the medication.  If we decide to hold Eliquis for more than 2 days before procedure, I agree with our pharmacist Cyril Mourning that he will require bridging with Lovenox given significantly high chads vas score and his stroke in 2019. Thanks.

## 2019-08-17 ENCOUNTER — Telehealth: Payer: Self-pay

## 2019-08-17 NOTE — Telephone Encounter (Signed)
2 days prior is fine, however, he will not be able to go back on this medication right away.  They can have issues with post op bleeding.  I may make sense to plan for a bridge post op for 1-2 weeks that can be quickly stopped if he has issues rather than Eliquis which hands around for a few days.    Thoughts?  Hollice Espy, MD

## 2019-08-18 ENCOUNTER — Encounter: Payer: Self-pay | Admitting: Pharmacist Clinician (PhC)/ Clinical Pharmacy Specialist

## 2019-08-18 ENCOUNTER — Other Ambulatory Visit: Payer: Self-pay

## 2019-08-18 ENCOUNTER — Encounter: Payer: Self-pay | Admitting: Physician Assistant

## 2019-08-18 ENCOUNTER — Telehealth: Payer: Self-pay | Admitting: Pharmacist Clinician (PhC)/ Clinical Pharmacy Specialist

## 2019-08-18 ENCOUNTER — Ambulatory Visit: Payer: Medicare Other | Admitting: Physician Assistant

## 2019-08-18 VITALS — BP 175/79 | HR 94 | Ht 72.0 in | Wt 245.0 lb

## 2019-08-18 DIAGNOSIS — R310 Gross hematuria: Secondary | ICD-10-CM

## 2019-08-18 MED ORDER — ENOXAPARIN SODIUM 100 MG/ML ~~LOC~~ SOLN: 100.0000 mg | Freq: Two times a day (BID) | SUBCUTANEOUS | 0 refills | Status: DC

## 2019-08-18 MED ORDER — CEFDINIR 300 MG PO CAPS: 300.0000 mg | ORAL_CAPSULE | Freq: Two times a day (BID) | ORAL | 0 refills | Status: AC

## 2019-08-18 NOTE — Telephone Encounter (Signed)
Directions for stopping/starting Eliquis around procedure.  Will need to hold Eliquis 2 days prior to procedure and for 7 days after.  During the 7 days after procedure, will need to use enoxaparin (Lovenox) 100 mg every 12 hours.  This will guard against risk of stroke, and is easier to clear out of the system should you have any bleeding issues after the procedure.  After 7 days of injections, you can restart the Eliquis at normal dose, unless told otherwise by MD.  If you have any questions about this, please don't hesitate to call.  You can speak with either Erasmo Downer or Raquel at [phone] (M-F [phone]).    Feb 20: No Eliquis  Feb 21: No Eliquis.  Feb 22: Procedure Day - No Lovenox or Eliquis   Feb 23: Start Lovenox 100 mg - inject in the fatty abdominal tissue at least 2 inches from the belly button twice a day about 12 hours apart, 8am and 8pm rotate sites  Feb 24: Inject Lovenox in the fatty tissue every 12 hours   Feb 25: Inject Lovenox in the fatty tissue every 12 hours   Feb 26: Inject Lovenox in the fatty tissue every 12 hours.  Feb 27: Inject Lovenox in the fatty tissue every 12 hours  Feb 28: Inject Lovenox in the fatty tissue every 12 hours.  Mar 1: Inject Lovenox in the fatty tissue every 12 hours.  Mar 2: Resume Eliquis 5 mg twice daily

## 2019-08-18 NOTE — Telephone Encounter (Signed)
I agree with the plan. Thanks!

## 2019-08-18 NOTE — Telephone Encounter (Signed)
Dr. Fletcher Anon - if you are okay with this, I will plan to have him hold Eliquis for 2 days prior to the procedure, then we can have him start enoxaparin q12h for 1 week after.  This would help with any post op bleeding concerns.  After 7 days, he can go back on Eliquis bid.

## 2019-08-18 NOTE — Telephone Encounter (Signed)
Spoke with patient and explained bridging.  Pt wife is retired Therapist, sports and can give him the injections.  Will send directions thru MyChart as well as mail.  Son has access to his MyChart and will review information.

## 2019-08-18 NOTE — Patient Instructions (Addendum)
1. Restart Eliquis today. 2. Pick up your prescription for Omnicef and start taking it today. 3. If your bladder spasms continue, call me on Monday and we can start you on medication for those as well.

## 2019-08-18 NOTE — Progress Notes (Signed)
08/18/2019 9:21 AM   R Ezequiel Kayser 09-04-1932 [id number]  CC: Gross hematuria  HPI: R Delvin Hedeen is a 86 y.o. male with A. fib on Eliquis and aspirin who presents today for evaluation of gross hematuria.  Recent urologic history significant for urinary retention with multiple failed voiding trials, now catheter dependent and awaiting scheduled HOLEP with Dr. Erlene Quan on 09/11/2019.  He developed recurrent Foley clot occlusion 1 week ago and was seen in the emergency department and subsequently in clinic on the same day for bladder irrigations.  Urine culture was obtained in the emergency department and has since resulted with both Klebsiella oxytoca and Citrobacter freundii.  At that time, he was counseled to take Keflex 5 mg 3 times daily x1 day for UTI prevention in the setting of multiple bladder irrigations with clot retention.  He was also counseled to hold Eliquis for a maximum of 2 days if he developed maroon-colored urinary output.    Today, he reports the development of maroon-colored urinary output 2 days ago associated with the passage of clot material, painful bladder spasms, and bleeding around his Foley catheter.  He says his catheter has drained continuously during this time.  He denies fever, chills, nausea, and vomiting.  He states he never took antibiotics as prescribed.  Additionally, he is concerned about the amount of gross hematuria he has experienced but denies fatigue, shortness of breath, or palpitations. He did stop Eliquis 2 days ago as per my prior instructions and notes his gross hematuria has improved in the interim.  Foley catheter in place today draining clear, amber urine without visible clot material.  Patient requires cardiac clearance prior to scheduled HOLEP.  Per chart review, Dr. Erlene Quan has been working with Dr. Tyrell Antonio office on this; there is concern regarding holding Eliquis for longer than 2 days due to his elevated CHA2DS2-VASc score.  Patient notes today  that he takes Eliquis daily in the morning and aspirin daily in the evening due to the cost of Eliquis and that cardiology is aware of this.  PMH: Past Medical History:  Diagnosis Date  . Abdominal bruit 05/05/2012  . Arthritis    back pain, past lumbar surgery   . BPH (benign prostatic hypertrophy)   . Carotid arterial disease (HCC)    a. s/p left-sided CEA  . Chronic atrial fibrillation ([place])    a. CHADS2VASc => 5 (HTN, age x 2, DM, vascular disease); Eliquis  . Diabetes mellitus type II    diet controlled   . HLD (hyperlipidemia)   . HTN (hypertension)   . Lung cancer ([place])    a. s/p resection and chemo in 2002  . TIA (transient ischemic attack) 2019    Surgical History: Past Surgical History:  Procedure Laterality Date  . ARCH AORTOGRAM  05/27/2012   Procedure: ARCH AORTOGRAM;  Surgeon: Elam Dutch, MD;  Location: [facility] CATH LAB;  Service: Cardiovascular;;  . CARDIOVERSION  06/29/2012   Procedure: CARDIOVERSION;  Surgeon: Thayer Headings, MD;  Location: [facility] ENDOSCOPY;  Service: Cardiovascular;  Laterality: N/A;  . CAROTID ANGIOGRAM N/A 05/27/2012   Procedure: CAROTID Cyril Loosen;  Surgeon: Elam Dutch, MD;  Location: [facility] CATH LAB;  Service: Cardiovascular;  Laterality: N/A;  . CAROTID ENDARTERECTOMY Left 05/30/12  . CATARACT EXTRACTION     right  . ENDARTERECTOMY  05/30/2012   Procedure: ENDARTERECTOMY CAROTID;  Surgeon: Elam Dutch, MD;  Location: [facility];  Service: Vascular;  Laterality: Left;  . ENDARTERECTOMY Right 04/25/2018  Procedure: ENDARTERECTOMY RIGHT CAROTID;  Surgeon: Elam Dutch, MD;  Location: [facility];  Service: Vascular;  Laterality: Right;  . EYE SURGERY     R detached retina- 1990's, IOL implants also   . [place]  . JOINT REPLACEMENT     knee- bilateral- 2009 and 2012  . LOBECTOMY  03/15/01   [facility])  . NOSE SURGERY    . PATCH ANGIOPLASTY Right 04/25/2018   Procedure: PATCH ANGIOPLASTY RIGHT CAROTID ARTERY USING  HEMASHILED PLATINUM FINESSE PATCH;  Surgeon: Elam Dutch, MD;  Location: [facility];  Service: Vascular;  Laterality: Right;  . RETINAL DETACHMENT SURGERY     right  . SPINE SURGERY  approx 2006   Ruptered Disc  . TONSILLECTOMY     1939  . TOTAL KNEE ARTHROPLASTY     x2 per Dr. Rush Farmer  . WEDGE RESECTION  03/15/01   [facility])    Home Medications:  Allergies as of 08/18/2019      Reactions   Xarelto [rivaroxaban] Other (See Comments)   vertigo      Medication List       Accurate as of August 18, 2019  9:21 AM. If you have any questions, ask your nurse or doctor.        amLODipine 5 MG tablet Commonly known as: NORVASC Take 1 tablet by mouth once daily   atorvastatin 80 MG tablet Commonly known as: LIPITOR TAKE 1 TABLET BY MOUTH AT BEDTIME   Eliquis 5 MG Tabs tablet Generic drug: apixaban Take 1 tablet by mouth twice daily   fish oil-omega-3 fatty acids 1000 MG capsule Take 1 g by mouth 3 (three) times daily.   furosemide 20 MG tablet Commonly known as: LASIX Take 1-2 tablets (20-40 mg total) by mouth daily as needed for fluid.   metFORMIN 500 MG tablet Commonly known as: GLUCOPHAGE Take 1 tablet (500 mg total) by mouth 2 (two) times daily with a meal.   methocarbamol 500 MG tablet Commonly known as: ROBAXIN TAKE 1 TABLET BY MOUTH AT BEDTIME AS NEEDED FOR RESTLESS LEGS   MIRALAX PO Take 17 g by mouth as needed.   PRESERVISION AREDS 2 PO Take 1 Can by mouth 2 (two) times daily.   ramipril 10 MG capsule Commonly known as: ALTACE Take 1 capsule (10 mg total) by mouth daily.   tamsulosin 0.4 MG Caps capsule Commonly known as: FLOMAX Take 1 capsule by mouth once daily   Trelegy Ellipta 100-62.5-25 MCG/INH Aepb Generic drug: Fluticasone-Umeclidin-Vilant Inhale 1 applicator into the lungs daily. Rinse mouth after use.       Allergies:  Allergies  Allergen Reactions  . Xarelto [Rivaroxaban] Other (See Comments)    vertigo    Family  History: Family History  Problem Relation Age of Onset  . Emphysema Father   . Dementia Mother   . Diabetes Mother   . Hypertension Mother   . Hypothyroidism Sister   . Dementia Brother   . Hypertension Brother   . Diabetes Son   . Heart disease Son        Heart Disease before age 42- Open Heart 2010  . Hypertension Son   . Stroke Son        while on coumadin  . Prostate cancer Neg Hx   . Colon cancer Neg Hx     Social History:   reports that he quit smoking about 36 years ago. His smoking use included cigarettes. He has a 32.00 pack-year  smoking history. He has never used smokeless tobacco. He reports that he does not drink alcohol or use drugs.  ROS: UROLOGY Frequent Urination?: No Hard to postpone urination?: No Burning/pain with urination?: No Get up at night to urinate?: No Leakage of urine?: No Urine stream starts and stops?: No Trouble starting stream?: No Do you have to strain to urinate?: No Blood in urine?: Yes Urinary tract infection?: No Sexually transmitted disease?: No Injury to kidneys or bladder?: No Painful intercourse?: No Weak stream?: No Erection problems?: No Penile pain?: No  Gastrointestinal Nausea?: No Vomiting?: No Indigestion/heartburn?: No Diarrhea?: No Constipation?: No  Constitutional Fever: No Night sweats?: No Weight loss?: No Fatigue?: No  Skin Skin rash/lesions?: No Itching?: No  Eyes Blurred vision?: No Double vision?: No  Ears/Nose/Throat Sore throat?: No Sinus problems?: No  Hematologic/Lymphatic Swollen glands?: No Easy bruising?: No  Cardiovascular Leg swelling?: No Chest pain?: No  Respiratory Cough?: No Shortness of breath?: No  Endocrine Excessive thirst?: No  Musculoskeletal Back pain?: No Joint pain?: No  Neurological Headaches?: No Dizziness?: No  Psychologic Depression?: No Anxiety?: No  Physical Exam: BP (!) 175/79   Pulse 94   Ht 6' (1.829 m)   Wt 245 lb (111.1 kg)   BMI  33.23 kg/m   Constitutional:  Alert and oriented, no acute distress, nontoxic appearing HEENT: [place], AT Cardiovascular: No clubbing, cyanosis, or edema Respiratory: Increased work of breathing GU: No suprapubic tenderness Skin: No rashes, bruises or suspicious lesions Neurologic: Grossly intact, no focal deficits, moving all 4 extremities Psychiatric: Normal mood and affect  Laboratory Data: Results for orders placed or performed during the hospital encounter of 08/11/19  Urine Culture   Specimen: Urine, Random  Result Value Ref Range   Specimen Description      URINE, RANDOM Performed at [facility], [address] [place], [place] [address]    Special Requests      NONE Performed at [facility], [address] [place], [place] [address]    Culture (A)     >=100,000 COLONIES/mL KLEBSIELLA OXYTOCA >=100,000 COLONIES/mL CITROBACTER FREUNDII    Report Status 08/13/2019 FINAL    Organism ID, Bacteria KLEBSIELLA OXYTOCA (A)    Organism ID, Bacteria CITROBACTER FREUNDII (A)       Susceptibility   Citrobacter freundii - MIC*    CEFAZOLIN >=64 RESISTANT Resistant     CEFTRIAXONE <=0.25 SENSITIVE Sensitive     CIPROFLOXACIN <=0.25 SENSITIVE Sensitive     GENTAMICIN <=1 SENSITIVE Sensitive     IMIPENEM 0.5 SENSITIVE Sensitive     NITROFURANTOIN <=16 SENSITIVE Sensitive     TRIMETH/SULFA <=20 SENSITIVE Sensitive     PIP/TAZO <=4 SENSITIVE Sensitive     * >=100,000 COLONIES/mL CITROBACTER FREUNDII   Klebsiella oxytoca - MIC*    AMPICILLIN >=32 RESISTANT Resistant     CEFAZOLIN <=4 SENSITIVE Sensitive     CEFTRIAXONE <=0.25 SENSITIVE Sensitive     CIPROFLOXACIN <=0.25 SENSITIVE Sensitive     GENTAMICIN <=1 SENSITIVE Sensitive     IMIPENEM <=0.25 SENSITIVE Sensitive     NITROFURANTOIN <=16 SENSITIVE Sensitive     TRIMETH/SULFA <=20 SENSITIVE Sensitive     AMPICILLIN/SULBACTAM 8 SENSITIVE Sensitive     PIP/TAZO <=4 SENSITIVE Sensitive     * >=100,000  COLONIES/mL KLEBSIELLA OXYTOCA   Assessment & Plan:   1. Hematuria, gross 86 year old male with A. fib on Eliquis and aspirin and BPH with recent Foley-dependent urinary retention awaiting HOLEP, here with recurrent gross hematuria with  recent Foley clot occlusion as well as painful bladder spasms.  Gross hematuria improved after having held Eliquis for 2 days.  ED urine culture from 1 week ago positive for multiple species; patient has not taken antibiotics for these.  I explained to him today that his painful bladder spasms may be secondary to urinary infection.  Per urine cultures last week, I will treat him with Omnicef x7 days.  I counseled him to contact the office on Monday if his spasms continue, at which point I will treat him pharmacologically for these.  I elected to irrigate his bladder today; see separate procedure note. Ultimately <82mL of small clot fragments were irrigated with yellow-clear urinary output. I am not concerned for continued clot retention at this time. I did counsel him to resume Eliquis today; I do suspect his bleeding will recur on this medication.  Will check CBC today given recent bleeding, though he denies concerning signs of anemia.  I reminded the patient of signs and symptoms of gross hematuria requiring urgent evaluation, including thick, ketchup-like urinary output; passage of large clots; dark, maroon-colored urine; lower abdominal pain; lumbar pain; abdominal distention; and the cessation of drainage from his Foley catheter.  I advised him to contact the office for assistance if he develops these symptoms during routine office hours, 8 AM to 5 PM Monday through Friday.  If outside those hours, I advised him to proceed to the emergency department. He expressed understanding.   - CBC - cefdinir (OMNICEF) 300 MG capsule; Take 1 capsule (300 mg total) by mouth 2 (two) times daily for 7 days.  Dispense: 14 capsule; Refill: 0   Return if symptoms worsen or fail to  improve.  Debroah Loop, PA-C  [facility] [address], [place], [place] [address] [phone]

## 2019-08-19 LAB — CBC
Hematocrit: 41 % (ref 37.5–51.0)
Hemoglobin: 13.5 g/dL (ref 13.0–17.7)
MCH: 28.8 pg (ref 26.6–33.0)
MCHC: 32.9 g/dL (ref 31.5–35.7)
MCV: 88 fL (ref 79–97)
Platelets: 248 10*3/uL (ref 150–450)
RBC: 4.68 x10E6/uL (ref 4.14–5.80)
RDW: 13.2 % (ref 11.6–15.4)
WBC: 11.1 10*3/uL — ABNORMAL HIGH (ref 3.4–10.8)

## 2019-08-21 ENCOUNTER — Encounter: Payer: Self-pay | Admitting: Radiology

## 2019-08-21 ENCOUNTER — Ambulatory Visit: Payer: Medicare Other

## 2019-08-22 ENCOUNTER — Telehealth: Payer: Self-pay | Admitting: Physician Assistant

## 2019-08-22 ENCOUNTER — Telehealth: Payer: Self-pay | Admitting: Radiology

## 2019-08-22 ENCOUNTER — Other Ambulatory Visit: Payer: Self-pay | Admitting: Radiology

## 2019-08-22 DIAGNOSIS — N401 Enlarged prostate with lower urinary tract symptoms: Secondary | ICD-10-CM

## 2019-08-22 DIAGNOSIS — R338 Other retention of urine: Secondary | ICD-10-CM

## 2019-08-22 MED ORDER — SULFAMETHOXAZOLE-TRIMETHOPRIM 800-160 MG PO TABS: 1.0000 | ORAL_TABLET | Freq: Two times a day (BID) | ORAL | 0 refills | Status: DC

## 2019-08-22 NOTE — Telephone Encounter (Signed)
Advised patient to stop taking omnicef and begin Bactrim DS bid x 7 days per Dr Erlene Quan. Script sent to pharmacy. Questions answered. Patient verbalizes understanding.

## 2019-08-22 NOTE — Telephone Encounter (Signed)
Please contact Jonathan York and inform him that his bloodwork came back from last week and remains normal.

## 2019-08-22 NOTE — Telephone Encounter (Signed)
-----   Message from Hollice Espy, MD sent at 08/22/2019  1:39 PM EST ----- Lets do cipro.  Did his urine get treated?  If not, can you please start him on Bactrim DS bid x 7 days through surgery?  Hollice Espy, MD ----- Message ----- From: Ranell Patrick, RN Sent: 08/18/2019   4:08 PM EST To: Hollice Espy, MD  Which preop abx to use?

## 2019-08-22 NOTE — Telephone Encounter (Signed)
Notified patient as instructed, patient pleased. Discussed follow-up appointments, patient agrees  

## 2019-08-23 ENCOUNTER — Other Ambulatory Visit: Payer: Self-pay

## 2019-08-23 ENCOUNTER — Encounter
Admission: RE | Admit: 2019-08-23 | Discharge: 2019-08-23 | Disposition: A | Discharge status: Home / Self Care | Payer: Medicare Other | Source: Ambulatory Visit | Attending: Urology | Admitting: Urology

## 2019-08-23 DIAGNOSIS — Z01812 Encounter for preprocedural laboratory examination: Secondary | ICD-10-CM | POA: Insufficient documentation

## 2019-08-23 DIAGNOSIS — Z20822 Contact with and (suspected) exposure to covid-19: Secondary | ICD-10-CM | POA: Insufficient documentation

## 2019-08-23 HISTORY — DX: Dyspnea, unspecified: R06.00

## 2019-08-23 HISTORY — DX: Chronic obstructive pulmonary disease, unspecified: J44.9

## 2019-08-23 NOTE — Patient Instructions (Signed)
Your procedure is scheduled on: Monday 08/28/19 Report to [facility]. To find out your arrival time please call [phone] between 1PM - 3PM on Friday 08/25/19.  Remember: Instructions that are not followed completely may result in serious medical risk, up to and including death, or upon the discretion of your surgeon and anesthesiologist your surgery may need to be rescheduled.     _X__ 1. Do not eat food after midnight the night before your procedure.                 No gum chewing or hard candies. You may drink clear liquids up to 2 hours                 before you are scheduled to arrive for your surgery- DO not drink clear                 liquids within 2 hours of the start of your surgery.                 Clear Liquids include:  water, apple juice without pulp, clear carbohydrate                 drink such as Clearfast or Gatorade, Black Coffee or Tea (Do not add                 anything to coffee or tea). Diabetics water only  __X__2.  On the morning of surgery brush your teeth with toothpaste and water, you                 may rinse your mouth with mouthwash if you wish.  Do not swallow any              toothpaste of mouthwash.     _X__ 3.  No Alcohol for 24 hours before or after surgery.   _X__ 4.  Do Not Smoke or use e-cigarettes For 24 Hours Prior to Your Surgery.                 Do not use any chewable tobacco products for at least 6 hours prior to                 surgery.  ____  5.  Bring all medications with you on the day of surgery if instructed.   __X__  6.  Notify your doctor if there is any change in your medical condition      (cold, fever, infections).     Do not wear jewelry, make-up, hairpins, clips or nail polish. Do not wear lotions, powders, or perfumes.  Do not shave 48 hours prior to surgery. Men may shave face and neck. Do not bring valuables to the hospital.    [facility] is not responsible for any  belongings or valuables.  Contacts, dentures/partials or body piercings may not be worn into surgery. Bring a case for your contacts, glasses or hearing aids, a denture cup will be supplied. Leave your suitcase in the car. After surgery it may be brought to your room. For patients admitted to the hospital, discharge time is determined by your treatment team.   Patients discharged the day of surgery will not be allowed to drive home.   Please read over the following fact sheets that you were given:   MRSA Information  __X__ Take these medicines the morning of surgery with A SIP OF WATER:  1. Amlodipine  2.   3.   4.  5.  6.  ____ Fleet Enema (as directed)   ____ Use CHG Soap/SAGE wipes as directed  ____ Use inhalers on the day of surgery  __X__ Stop metformin/Janumet/Farxiga 2 days prior to surgery NONE Saturday OR SUNDAY   ____ Take 1/2 of usual insulin dose the night before surgery. No insulin the morning          of surgery.   __X__ Stop Blood Thinners Coumadin/Plavix/Xarelto/Pleta/Pradaxa/Eliquis/Effient/Aspirin  on (as previously instructed)  Or contact your Surgeon, Cardiologist or Medical Doctor regarding  ability to stop your blood thinners  __X__ Stop Anti-inflammatories 7 days before surgery such as Advil, Ibuprofen, Motrin,  BC or Goodies Powder, Naprosyn, Naproxen, Aleve, Aspirin (Use Tylenol or Acetaminophen if not contraindicated)   __X__ Stop all herbal supplements, fish oil or vitamin E until after surgery.    ____ Bring C-Pap to the hospital.

## 2019-08-24 ENCOUNTER — Other Ambulatory Visit
Admission: RE | Admit: 2019-08-24 | Discharge: 2019-08-24 | Disposition: A | Discharge status: Home / Self Care | Payer: Medicare Other | Source: Ambulatory Visit | Attending: Urology | Admitting: Urology

## 2019-08-24 DIAGNOSIS — Z20822 Contact with and (suspected) exposure to covid-19: Secondary | ICD-10-CM | POA: Diagnosis not present

## 2019-08-24 DIAGNOSIS — Z01812 Encounter for preprocedural laboratory examination: Secondary | ICD-10-CM | POA: Diagnosis not present

## 2019-08-24 LAB — SARS CORONAVIRUS 2 (TAT 6-24 HRS): SARS Coronavirus 2: NEGATIVE

## 2019-08-25 ENCOUNTER — Ambulatory Visit: Payer: Medicare Other | Admitting: Urology

## 2019-08-25 ENCOUNTER — Other Ambulatory Visit: Payer: Self-pay

## 2019-08-25 ENCOUNTER — Encounter: Payer: Self-pay | Admitting: Urology

## 2019-08-25 ENCOUNTER — Telehealth: Payer: Self-pay

## 2019-08-25 VITALS — BP 132/74 | HR 100 | Ht 75.0 in | Wt 248.0 lb

## 2019-08-25 DIAGNOSIS — R338 Other retention of urine: Secondary | ICD-10-CM

## 2019-08-25 DIAGNOSIS — N401 Enlarged prostate with lower urinary tract symptoms: Secondary | ICD-10-CM

## 2019-08-25 NOTE — H&P (View-Only) (Signed)
08/25/2019 10:10 AM   Jonathan York January 25, 1933 [id number]  Referring provider: Tonia Ghent, MD [address] [place],   [address]  Chief Complaint  Patient presents with  . Benign Prostatic Hypertrophy    discuss HOLEP    HPI: 86 year old male with refractory urinary retention despite multiple voiding trials who presents today to discuss holmium laser enucleation of the prostate surgery.  He is significant prostatomegaly, 98 g prostate.  Cystoscopy reveals lateral lobe enlargement, moderately elevated bladder neck and trabeculation consistent with outlet obstruction from prostatomegaly.  He has been on medical therapy (flomax) and continues to have unsuccessful voiding trials and issues with gross hematuria.  He has received cardiac clearance for surgery.  He is going to hold his Eliquis for 2 days before the procedure and then be on a Lovenox bridge postoperatively.  He presents today primarily for perioperative discussion.   PMH: Past Medical History:  Diagnosis Date  . Abdominal bruit 05/05/2012  . Arthritis    back pain, past lumbar surgery   . BPH (benign prostatic hypertrophy)   . Carotid arterial disease (HCC)    a. s/p left-sided CEA  . Chronic atrial fibrillation ([place])    a. CHADS2VASc => 5 (HTN, age x 2, DM, vascular disease); Eliquis  . COPD (chronic obstructive pulmonary disease) ([place])   . Diabetes mellitus type II    diet controlled   . Dyspnea   . HLD (hyperlipidemia)   . HTN (hypertension)   . Lung cancer ([place])    a. s/p resection and chemo in 2002  . TIA (transient ischemic attack) 2019    Surgical History: Past Surgical History:  Procedure Laterality Date  . ARCH AORTOGRAM  05/27/2012   Procedure: ARCH AORTOGRAM;  Surgeon: Elam Dutch, MD;  Location: [facility] C[facility] LAB;  Service: Cardiovascular;;  . BACK SURGERY    . CARDIOVERSION  06/29/2012   Procedure: CARDIOVERSION;  Surgeon: Thayer Headings, MD;  Location: [place];   Service: Cardiovascular;  Laterality: N/A;  . CAROTID ANGIOGRAM N/A 05/27/2012   Procedure: CAROTID Cyril Loosen;  Surgeon: Elam Dutch, MD;  Location: [facility] CATH LAB;  Service: Cardiovascular;  Laterality: N/A;  . CAROTID ENDARTERECTOMY Left 05/30/12  . CATARACT EXTRACTION     right  . ENDARTERECTOMY  05/30/2012   Procedure: ENDARTERECTOMY CAROTID;  Surgeon: Elam Dutch, MD;  Location: [place];  Service: Vascular;  Laterality: Left;  . ENDARTERECTOMY Right 04/25/2018   Procedure: ENDARTERECTOMY RIGHT CAROTID;  Surgeon: Elam Dutch, MD;  Location: [place];  Service: Vascular;  Laterality: Right;  . EYE SURGERY     R detached retina- 1990's, IOL implants also   . [place]  . JOINT REPLACEMENT     knee- bilateral- 2009 and 2012  . LOBECTOMY  03/15/01   LUL Roxan Hockey)  . NOSE SURGERY    . PATCH ANGIOPLASTY Right 04/25/2018   Procedure: PATCH ANGIOPLASTY RIGHT CAROTID ARTERY USING HEMASHILED PLATINUM FINESSE PATCH;  Surgeon: Elam Dutch, MD;  Location: [place];  Service: Vascular;  Laterality: Right;  . RETINAL DETACHMENT SURGERY     right  . SPINE SURGERY  approx 2006   Ruptered Disc  . TONSILLECTOMY     1939  . TOTAL KNEE ARTHROPLASTY     x2 per Dr. Rush Farmer  . WEDGE RESECTION  03/15/01   LUL Roxan Hockey)    Home Medications:  Allergies as of 08/25/2019      Reactions   Xarelto [rivaroxaban]  Other (See Comments)   vertigo      Medication List       Accurate as of August 25, 2019 10:10 AM. If you have any questions, ask your nurse or doctor.        amLODipine 5 MG tablet Commonly known as: NORVASC Take 1 tablet by mouth once daily   atorvastatin 80 MG tablet Commonly known as: LIPITOR TAKE 1 TABLET BY MOUTH AT BEDTIME   cefdinir 300 MG capsule Commonly known as: OMNICEF Take 1 capsule (300 mg total) by mouth 2 (two) times daily for 7 days.   Eliquis 5 MG Tabs tablet Generic drug: apixaban Take 1 tablet by mouth twice daily What  changed: how much to take   enoxaparin 100 MG/ML injection Commonly known as: LOVENOX Inject 1 mL (100 mg total) into the skin every 12 (twelve) hours.   fish oil-omega-3 fatty acids 1000 MG capsule Take 1 g by mouth 3 (three) times daily.   furosemide 20 MG tablet Commonly known as: LASIX Take 1-2 tablets (20-40 mg total) by mouth daily as needed for fluid.   metFORMIN 500 MG tablet Commonly known as: GLUCOPHAGE Take 1 tablet (500 mg total) by mouth 2 (two) times daily with a meal.   methocarbamol 500 MG tablet Commonly known as: ROBAXIN TAKE 1 TABLET BY MOUTH AT BEDTIME AS NEEDED FOR RESTLESS LEGS What changed: See the new instructions.   MiraLax 17 g packet Generic drug: polyethylene glycol Take 17 g by mouth daily as needed (constipation).   PRESERVISION AREDS 2 PO Take 1 capsule by mouth 2 (two) times daily.   ramipril 10 MG capsule Commonly known as: ALTACE Take 1 capsule (10 mg total) by mouth daily.   sulfamethoxazole-trimethoprim 800-160 MG tablet Commonly known as: BACTRIM DS Take 1 tablet by mouth 2 (two) times daily.   tamsulosin 0.4 MG Caps capsule Commonly known as: FLOMAX Take 1 capsule by mouth once daily   Trelegy Ellipta 100-62.5-25 MCG/INH Aepb Generic drug: Fluticasone-Umeclidin-Vilant Inhale 1 applicator into the lungs daily. Rinse mouth after use.       Allergies:  Allergies  Allergen Reactions  . Xarelto [Rivaroxaban] Other (See Comments)    vertigo    Family History: Family History  Problem Relation Age of Onset  . Emphysema Father   . Dementia Mother   . Diabetes Mother   . Hypertension Mother   . Hypothyroidism Sister   . Dementia Brother   . Hypertension Brother   . Diabetes Son   . Heart disease Son        Heart Disease before age 65- Open Heart 2010  . Hypertension Son   . Stroke Son        while on coumadin  . Prostate cancer Neg Hx   . Colon cancer Neg Hx     Social History:  reports that he quit smoking about  37 years ago. His smoking use included cigarettes. He has a 32.00 pack-year smoking history. He has never used smokeless tobacco. He reports that he does not drink alcohol or use drugs.  ROS: UROLOGY Frequent Urination?: No Hard to postpone urination?: No Burning/pain with urination?: No Get up at night to urinate?: No Leakage of urine?: No Urine stream starts and stops?: No Trouble starting stream?: No Do you have to strain to urinate?: No Blood in urine?: No Urinary tract infection?: No Sexually transmitted disease?: No Injury to kidneys or bladder?: No Painful intercourse?: No Weak stream?: No Erection problems?: No Penile  pain?: No  Gastrointestinal Nausea?: No Vomiting?: No Indigestion/heartburn?: No Diarrhea?: No Constipation?: No  Constitutional Fever: No Night sweats?: No Weight loss?: No Fatigue?: No  Skin Skin rash/lesions?: No Itching?: No  Eyes Blurred vision?: No Double vision?: No  Ears/Nose/Throat Sore throat?: No Sinus problems?: No  Hematologic/Lymphatic Swollen glands?: No Easy bruising?: No  Cardiovascular Leg swelling?: Yes Chest pain?: No  Respiratory Cough?: No Shortness of breath?: No  Endocrine Excessive thirst?: No  Musculoskeletal Back pain?: No Joint pain?: No  Neurological Headaches?: No Dizziness?: No  Psychologic Depression?: No Anxiety?: No  Physical Exam: BP 132/74   Pulse 100   Ht 6\' 3"  (1.905 m)   Wt 248 lb (112.5 kg)   BMI 31.00 kg/m   Constitutional:  Alert and oriented, No acute distress. HEENT: [place] AT, moist mucus membranes.  Trachea midline, no masses. Cardiovascular: No clubbing, cyanosis, or edema. Respiratory: Normal respiratory effort, no increased work of breathing. Skin: No rashes, bruises or suspicious lesions. Neurologic: Grossly intact, no focal deficits, moving all 4 extremities. Psychiatric: Normal mood and affect.  Laboratory Data: Lab Results  Component Value Date   WBC 11.1  (H) 08/18/2019   HGB 13.5 08/18/2019   HCT 41.0 08/18/2019   MCV 88 08/18/2019   PLT 248 08/18/2019    Lab Results  Component Value Date   CREATININE 0.98 08/11/2019    Lab Results  Component Value Date   PSA 1.72 01/17/2013   PSA 1.81 08/25/2011   PSA 1.56 10/27/2010    Lab Results  Component Value Date   HGBA1C 9.8 (H) 03/13/2019    Assessment & Plan:    1. 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  This also would have decreased risk of bleeding.    We reviewed the surgery in detail today including the preoperative, intraoperative, and postoperative course.  This will most likely be an outpatient procedure pending the degree of post op hematuria.  He will go home with catheter for a few days post op and will either be taught how to remove his own catheter or return to the office for catheter removal.  Risk of bleeding, infection, damage surrounding structures, injury to the bladder/ urethral, bladder neck contracture, ureteral stricture, retrograde ejaculation, stress/ urge incontinence, exacerbation of irritative voiding symptoms were all discussed in detail.    Specifically today, we discussed that given the severity of his outlet obstruction, there is a chance of procedure failure as well as severe or persistent urge incontinence as well as stress incontinence.  He understands this risk and is willing to proceed.  Anticoagulation therapy regimen was reviewed again with the patient, his handout was corrected with the correct dates for the procedure Monday and all questions answered.   2. Acute urinary retention As above   Hollice Espy, MD  [facility] [address], [place], [place] [address] [phone]

## 2019-08-25 NOTE — Progress Notes (Signed)
08/25/2019 10:10 AM   Jonathan York 1933-06-28 [id number]  Referring provider: Tonia Ghent, MD [address] [place],  [place] [address]  Chief Complaint  Patient presents with  . Benign Prostatic Hypertrophy    discuss HOLEP    HPI: 86 year old male with refractory urinary retention despite multiple voiding trials who presents today to discuss holmium laser enucleation of the prostate surgery.  He is significant prostatomegaly, 98 g prostate.  Cystoscopy reveals lateral lobe enlargement, moderately elevated bladder neck and trabeculation consistent with outlet obstruction from prostatomegaly.  He has been on medical therapy (flomax) and continues to have unsuccessful voiding trials and issues with gross hematuria.  He has received cardiac clearance for surgery.  He is going to hold his Eliquis for 2 days before the procedure and then be on a Lovenox bridge postoperatively.  He presents today primarily for perioperative discussion.   PMH: Past Medical History:  Diagnosis Date  . Abdominal bruit 05/05/2012  . Arthritis    back pain, past lumbar surgery   . BPH (benign prostatic hypertrophy)   . Carotid arterial disease (HCC)    a. s/p left-sided CEA  . Chronic atrial fibrillation ([place])    a. CHADS2VASc => 5 (HTN, age x 2, DM, vascular disease); Eliquis  . COPD (chronic obstructive pulmonary disease) ([place])   . Diabetes mellitus type II    diet controlled   . Dyspnea   . HLD (hyperlipidemia)   . HTN (hypertension)   . Lung cancer ([place])    a. s/p resection and chemo in 2002  . TIA (transient ischemic attack) 2019    Surgical History: Past Surgical History:  Procedure Laterality Date  . ARCH AORTOGRAM  05/27/2012   Procedure: ARCH AORTOGRAM;  Surgeon: Elam Dutch, MD;  Location: [facility] CATH LAB;  Service: Cardiovascular;;  . BACK SURGERY    . CARDIOVERSION  06/29/2012   Procedure: CARDIOVERSION;  Surgeon: Thayer Headings, MD;  Location: [employer];   Service: Cardiovascular;  Laterality: N/A;  . CAROTID ANGIOGRAM N/A 05/27/2012   Procedure: CAROTID Cyril Loosen;  Surgeon: Elam Dutch, MD;  Location: [facility] CATH LAB;  Service: Cardiovascular;  Laterality: N/A;  . CAROTID ENDARTERECTOMY Left 05/30/12  . CATARACT EXTRACTION     right  . ENDARTERECTOMY  05/30/2012   Procedure: ENDARTERECTOMY CAROTID;  Surgeon: Elam Dutch, MD;  Location: [place];  Service: Vascular;  Laterality: Left;  . ENDARTERECTOMY Right 04/25/2018   Procedure: ENDARTERECTOMY RIGHT CAROTID;  Surgeon: Elam Dutch, MD;  Location: [place];  Service: Vascular;  Laterality: Right;  . EYE SURGERY     R detached retina- 1990's, IOL implants also   . [place]  . JOINT REPLACEMENT     knee- bilateral- 2009 and 2012  . LOBECTOMY  03/15/01   LUL Roxan Hockey)  . NOSE SURGERY    . PATCH ANGIOPLASTY Right 04/25/2018   Procedure: PATCH ANGIOPLASTY RIGHT CAROTID ARTERY USING HEMASHILED PLATINUM FINESSE PATCH;  Surgeon: Elam Dutch, MD;  Location: [place];  Service: Vascular;  Laterality: Right;  . RETINAL DETACHMENT SURGERY     right  . SPINE SURGERY  approx 2006   Ruptered Disc  . TONSILLECTOMY     1939  . TOTAL KNEE ARTHROPLASTY     x2 per Dr. Rush Farmer  . WEDGE RESECTION  03/15/01   LUL Roxan Hockey)    Home Medications:  Allergies as of 08/25/2019      Reactions   Xarelto [rivaroxaban]  Other (See Comments)   vertigo      Medication List       Accurate as of August 25, 2019 10:10 AM. If you have any questions, ask your nurse or doctor.        amLODipine 5 MG tablet Commonly known as: NORVASC Take 1 tablet by mouth once daily   atorvastatin 80 MG tablet Commonly known as: LIPITOR TAKE 1 TABLET BY MOUTH AT BEDTIME   cefdinir 300 MG capsule Commonly known as: OMNICEF Take 1 capsule (300 mg total) by mouth 2 (two) times daily for 7 days.   Eliquis 5 MG Tabs tablet Generic drug: apixaban Take 1 tablet by mouth twice daily What  changed: how much to take   enoxaparin 100 MG/ML injection Commonly known as: LOVENOX Inject 1 mL (100 mg total) into the skin every 12 (twelve) hours.   fish oil-omega-3 fatty acids 1000 MG capsule Take 1 g by mouth 3 (three) times daily.   furosemide 20 MG tablet Commonly known as: LASIX Take 1-2 tablets (20-40 mg total) by mouth daily as needed for fluid.   metFORMIN 500 MG tablet Commonly known as: GLUCOPHAGE Take 1 tablet (500 mg total) by mouth 2 (two) times daily with a meal.   methocarbamol 500 MG tablet Commonly known as: ROBAXIN TAKE 1 TABLET BY MOUTH AT BEDTIME AS NEEDED FOR RESTLESS LEGS What changed: See the new instructions.   MiraLax 17 g packet Generic drug: polyethylene glycol Take 17 g by mouth daily as needed (constipation).   PRESERVISION AREDS 2 PO Take 1 capsule by mouth 2 (two) times daily.   ramipril 10 MG capsule Commonly known as: ALTACE Take 1 capsule (10 mg total) by mouth daily.   sulfamethoxazole-trimethoprim 800-160 MG tablet Commonly known as: BACTRIM DS Take 1 tablet by mouth 2 (two) times daily.   tamsulosin 0.4 MG Caps capsule Commonly known as: FLOMAX Take 1 capsule by mouth once daily   Trelegy Ellipta 100-62.5-25 MCG/INH Aepb Generic drug: Fluticasone-Umeclidin-Vilant Inhale 1 applicator into the lungs daily. Rinse mouth after use.       Allergies:  Allergies  Allergen Reactions  . Xarelto [Rivaroxaban] Other (See Comments)    vertigo    Family History: Family History  Problem Relation Age of Onset  . Emphysema Father   . Dementia Mother   . Diabetes Mother   . Hypertension Mother   . Hypothyroidism Sister   . Dementia Brother   . Hypertension Brother   . Diabetes Son   . Heart disease Son        Heart Disease before age 68- Open Heart 2010  . Hypertension Son   . Stroke Son        while on coumadin  . Prostate cancer Neg Hx   . Colon cancer Neg Hx     Social History:  reports that he quit smoking about  37 years ago. His smoking use included cigarettes. He has a 32.00 pack-year smoking history. He has never used smokeless tobacco. He reports that he does not drink alcohol or use drugs.  ROS: UROLOGY Frequent Urination?: No Hard to postpone urination?: No Burning/pain with urination?: No Get up at night to urinate?: No Leakage of urine?: No Urine stream starts and stops?: No Trouble starting stream?: No Do you have to strain to urinate?: No Blood in urine?: No Urinary tract infection?: No Sexually transmitted disease?: No Injury to kidneys or bladder?: No Painful intercourse?: No Weak stream?: No Erection problems?: No Penile  pain?: No  Gastrointestinal Nausea?: No Vomiting?: No Indigestion/heartburn?: No Diarrhea?: No Constipation?: No  Constitutional Fever: No Night sweats?: No Weight loss?: No Fatigue?: No  Skin Skin rash/lesions?: No Itching?: No  Eyes Blurred vision?: No Double vision?: No  Ears/Nose/Throat Sore throat?: No Sinus problems?: No  Hematologic/Lymphatic Swollen glands?: No Easy bruising?: No  Cardiovascular Leg swelling?: Yes Chest pain?: No  Respiratory Cough?: No Shortness of breath?: No  Endocrine Excessive thirst?: No  Musculoskeletal Back pain?: No Joint pain?: No  Neurological Headaches?: No Dizziness?: No  Psychologic Depression?: No Anxiety?: No  Physical Exam: BP 132/74   Pulse 100   Ht 6\' 3"  (1.905 m)   Wt 248 lb (112.5 kg)   BMI 31.00 kg/m   Constitutional:  Alert and oriented, No acute distress. HEENT: [place] AT, moist mucus membranes.  Trachea midline, no masses. Cardiovascular: No clubbing, cyanosis, or edema. Respiratory: Normal respiratory effort, no increased work of breathing. Skin: No rashes, bruises or suspicious lesions. Neurologic: Grossly intact, no focal deficits, moving all 4 extremities. Psychiatric: Normal mood and affect.  Laboratory Data: Lab Results  Component Value Date   WBC 11.1  (H) 08/18/2019   HGB 13.5 08/18/2019   HCT 41.0 08/18/2019   MCV 88 08/18/2019   PLT 248 08/18/2019    Lab Results  Component Value Date   CREATININE 0.98 08/11/2019    Lab Results  Component Value Date   PSA 1.72 01/17/2013   PSA 1.81 08/25/2011   PSA 1.56 10/27/2010    Lab Results  Component Value Date   HGBA1C 9.8 (H) 03/13/2019    Assessment & Plan:    1. 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  This also would have decreased risk of bleeding.    We reviewed the surgery in detail today including the preoperative, intraoperative, and postoperative course.  This will most likely be an outpatient procedure pending the degree of post op hematuria.  He will go home with catheter for a few days post op and will either be taught how to remove his own catheter or return to the office for catheter removal.  Risk of bleeding, infection, damage surrounding structures, injury to the bladder/ urethral, bladder neck contracture, ureteral stricture, retrograde ejaculation, stress/ urge incontinence, exacerbation of irritative voiding symptoms were all discussed in detail.    Specifically today, we discussed that given the severity of his outlet obstruction, there is a chance of procedure failure as well as severe or persistent urge incontinence as well as stress incontinence.  He understands this risk and is willing to proceed.  Anticoagulation therapy regimen was reviewed again with the patient, his handout was corrected with the correct dates for the procedure Monday and all questions answered.   2. Acute urinary retention As above   Hollice Espy, MD  [facility] [address], [place],  [address] [phone]

## 2019-08-25 NOTE — Telephone Encounter (Signed)
Called received for Jonathan York @ [place]. The patient was seen in their office today for his pre-surgical exam. Patients surgery is scheduled for 08/28/19. Patient and his wife who is a retired Therapist, sports were previously give instructions by our Pharmacist regarding holding Eliquis and being bridged with Lovenox after surgery. The patient told Junie Panning that he was confused about the instructions and self administered his first Lovenox injection yesterday evening. The concern is that he has taken Eliquis as well. He hasn't reported any signs of bleeding Junie Panning has printed and given him the written Instructions with specified dates from [place], Pharm-D regarding holding Eliquis, when to start Lovenox, and when to resume Eliquis. She has instructed him to call the Pharmacist at our [place] office to provide an update. The patient will be short one Lovenox dose for his post op administering.  Advised Jonathan York that I will fwd the update to our Pharmacist.

## 2019-08-25 NOTE — Telephone Encounter (Signed)
Talked to Mr Jonathan York.  Dr Delmar Landau was able to provide written instructions to the patient today.  No additional questions at this time.

## 2019-08-27 ENCOUNTER — Ambulatory Visit: Payer: Medicare Other

## 2019-08-27 MED ORDER — CIPROFLOXACIN IN D5W 400 MG/200ML IV SOLN
400.0000 mg | Freq: Once | INTRAVENOUS | Status: AC
  Administered 2019-08-28: 400 mg via INTRAVENOUS

## 2019-08-28 ENCOUNTER — Ambulatory Visit (INDEPENDENT_AMBULATORY_CARE_PROVIDER_SITE_OTHER): Payer: Medicare Other | Admitting: Family Medicine

## 2019-08-28 ENCOUNTER — Ambulatory Visit: Payer: Medicare Other | Admitting: Anesthesiology

## 2019-08-28 ENCOUNTER — Encounter
Admission: RE | Disposition: A | Discharge status: Home / Self Care | Payer: Self-pay | Source: Home / Self Care | Attending: Urology

## 2019-08-28 ENCOUNTER — Encounter: Payer: Self-pay | Admitting: Family Medicine

## 2019-08-28 ENCOUNTER — Encounter: Payer: Self-pay | Admitting: Urology

## 2019-08-28 ENCOUNTER — Ambulatory Visit
Admission: RE | Admit: 2019-08-28 | Discharge: 2019-08-28 | Disposition: A | Discharge status: Home / Self Care | Payer: Medicare Other | Attending: Urology | Admitting: Urology

## 2019-08-28 ENCOUNTER — Other Ambulatory Visit: Payer: Self-pay

## 2019-08-28 VITALS — BP 124/60 | HR 113 | Temp 96.9°F | Ht 75.0 in | Wt 251.1 lb

## 2019-08-28 DIAGNOSIS — Z8719 Personal history of other diseases of the digestive system: Secondary | ICD-10-CM | POA: Insufficient documentation

## 2019-08-28 DIAGNOSIS — J449 Chronic obstructive pulmonary disease, unspecified: Secondary | ICD-10-CM | POA: Diagnosis not present

## 2019-08-28 DIAGNOSIS — Z825 Family history of asthma and other chronic lower respiratory diseases: Secondary | ICD-10-CM | POA: Insufficient documentation

## 2019-08-28 DIAGNOSIS — N401 Enlarged prostate with lower urinary tract symptoms: Secondary | ICD-10-CM | POA: Insufficient documentation

## 2019-08-28 DIAGNOSIS — Z888 Allergy status to other drugs, medicaments and biological substances status: Secondary | ICD-10-CM | POA: Insufficient documentation

## 2019-08-28 DIAGNOSIS — Z8673 Personal history of transient ischemic attack (TIA), and cerebral infarction without residual deficits: Secondary | ICD-10-CM | POA: Diagnosis not present

## 2019-08-28 DIAGNOSIS — Z87891 Personal history of nicotine dependence: Secondary | ICD-10-CM | POA: Insufficient documentation

## 2019-08-28 DIAGNOSIS — Z9221 Personal history of antineoplastic chemotherapy: Secondary | ICD-10-CM | POA: Insufficient documentation

## 2019-08-28 DIAGNOSIS — Z9841 Cataract extraction status, right eye: Secondary | ICD-10-CM | POA: Insufficient documentation

## 2019-08-28 DIAGNOSIS — E785 Hyperlipidemia, unspecified: Secondary | ICD-10-CM | POA: Insufficient documentation

## 2019-08-28 DIAGNOSIS — E1151 Type 2 diabetes mellitus with diabetic peripheral angiopathy without gangrene: Secondary | ICD-10-CM | POA: Insufficient documentation

## 2019-08-28 DIAGNOSIS — H579 Unspecified disorder of eye and adnexa: Secondary | ICD-10-CM | POA: Diagnosis not present

## 2019-08-28 DIAGNOSIS — Z96653 Presence of artificial knee joint, bilateral: Secondary | ICD-10-CM | POA: Diagnosis not present

## 2019-08-28 DIAGNOSIS — N320 Bladder-neck obstruction: Secondary | ICD-10-CM | POA: Diagnosis not present

## 2019-08-28 DIAGNOSIS — E1159 Type 2 diabetes mellitus with other circulatory complications: Secondary | ICD-10-CM | POA: Diagnosis not present

## 2019-08-28 DIAGNOSIS — Z8249 Family history of ischemic heart disease and other diseases of the circulatory system: Secondary | ICD-10-CM | POA: Insufficient documentation

## 2019-08-28 DIAGNOSIS — Z823 Family history of stroke: Secondary | ICD-10-CM | POA: Insufficient documentation

## 2019-08-28 DIAGNOSIS — Z8349 Family history of other endocrine, nutritional and metabolic diseases: Secondary | ICD-10-CM | POA: Insufficient documentation

## 2019-08-28 DIAGNOSIS — Z85118 Personal history of other malignant neoplasm of bronchus and lung: Secondary | ICD-10-CM | POA: Diagnosis not present

## 2019-08-28 DIAGNOSIS — E119 Type 2 diabetes mellitus without complications: Secondary | ICD-10-CM | POA: Diagnosis not present

## 2019-08-28 DIAGNOSIS — Z79899 Other long term (current) drug therapy: Secondary | ICD-10-CM | POA: Insufficient documentation

## 2019-08-28 DIAGNOSIS — I10 Essential (primary) hypertension: Secondary | ICD-10-CM | POA: Insufficient documentation

## 2019-08-28 DIAGNOSIS — K219 Gastro-esophageal reflux disease without esophagitis: Secondary | ICD-10-CM | POA: Insufficient documentation

## 2019-08-28 DIAGNOSIS — R339 Retention of urine, unspecified: Secondary | ICD-10-CM

## 2019-08-28 DIAGNOSIS — N138 Other obstructive and reflux uropathy: Secondary | ICD-10-CM | POA: Insufficient documentation

## 2019-08-28 DIAGNOSIS — I482 Chronic atrial fibrillation, unspecified: Secondary | ICD-10-CM | POA: Diagnosis not present

## 2019-08-28 DIAGNOSIS — R0600 Dyspnea, unspecified: Secondary | ICD-10-CM | POA: Diagnosis not present

## 2019-08-28 DIAGNOSIS — M1990 Unspecified osteoarthritis, unspecified site: Secondary | ICD-10-CM | POA: Insufficient documentation

## 2019-08-28 DIAGNOSIS — Z820 Family history of epilepsy and other diseases of the nervous system: Secondary | ICD-10-CM | POA: Insufficient documentation

## 2019-08-28 DIAGNOSIS — R338 Other retention of urine: Secondary | ICD-10-CM | POA: Diagnosis not present

## 2019-08-28 DIAGNOSIS — Z833 Family history of diabetes mellitus: Secondary | ICD-10-CM | POA: Insufficient documentation

## 2019-08-28 HISTORY — PX: HOLEP-LASER ENUCLEATION OF THE PROSTATE WITH MORCELLATION: SHX6641

## 2019-08-28 LAB — GLUCOSE, CAPILLARY
Glucose-Capillary: 141 mg/dL — ABNORMAL HIGH (ref 70–99)
Glucose-Capillary: 141 mg/dL — ABNORMAL HIGH (ref 70–99)

## 2019-08-28 SURGERY — ENUCLEATION, PROSTATE, USING LASER, WITH MORCELLATION
Anesthesia: General | Site: Prostate

## 2019-08-28 MED ORDER — LIDOCAINE HCL (PF) 2 % IJ SOLN
INTRAMUSCULAR | Status: AC
  Filled 2019-08-28: qty 5

## 2019-08-28 MED ORDER — FUROSEMIDE 20 MG PO TABS: 20.0000 mg | ORAL_TABLET | Freq: Every day | ORAL | 2 refills | Status: DC | PRN

## 2019-08-28 MED ORDER — EPHEDRINE SULFATE 50 MG/ML IJ SOLN
INTRAMUSCULAR | Status: DC | PRN
  Administered 2019-08-28 (×2): 5 mg via INTRAVENOUS

## 2019-08-28 MED ORDER — FENTANYL CITRATE (PF) 100 MCG/2ML IJ SOLN
25.0000 ug | INTRAMUSCULAR | Status: DC | PRN
  Administered 2019-08-28: 25 ug via INTRAVENOUS

## 2019-08-28 MED ORDER — FAMOTIDINE 20 MG PO TABS
ORAL_TABLET | ORAL | Status: AC
  Administered 2019-08-28: 20 mg via ORAL
  Filled 2019-08-28: qty 1

## 2019-08-28 MED ORDER — SUCCINYLCHOLINE CHLORIDE 20 MG/ML IJ SOLN
INTRAMUSCULAR | Status: AC
  Filled 2019-08-28: qty 1

## 2019-08-28 MED ORDER — ROCURONIUM BROMIDE 50 MG/5ML IV SOLN
INTRAVENOUS | Status: AC
  Filled 2019-08-28: qty 1

## 2019-08-28 MED ORDER — ROCURONIUM BROMIDE 100 MG/10ML IV SOLN
INTRAVENOUS | Status: DC | PRN
  Administered 2019-08-28 (×3): 10 mg via INTRAVENOUS

## 2019-08-28 MED ORDER — DEXAMETHASONE SODIUM PHOSPHATE 10 MG/ML IJ SOLN
INTRAMUSCULAR | Status: DC | PRN
  Administered 2019-08-28: 5 mg via INTRAVENOUS

## 2019-08-28 MED ORDER — LIDOCAINE HCL (CARDIAC) PF 100 MG/5ML IV SOSY
PREFILLED_SYRINGE | INTRAVENOUS | Status: DC | PRN
  Administered 2019-08-28: 100 mg via INTRAVENOUS

## 2019-08-28 MED ORDER — EPHEDRINE SULFATE 50 MG/ML IJ SOLN
INTRAMUSCULAR | Status: AC
  Filled 2019-08-28: qty 1

## 2019-08-28 MED ORDER — ONDANSETRON HCL 4 MG/2ML IJ SOLN: 4.0000 mg | Freq: Once | INTRAMUSCULAR | Status: DC | PRN

## 2019-08-28 MED ORDER — FUROSEMIDE 10 MG/ML IJ SOLN
INTRAMUSCULAR | Status: DC | PRN
  Administered 2019-08-28: 10 mg via INTRAMUSCULAR

## 2019-08-28 MED ORDER — FENTANYL CITRATE (PF) 100 MCG/2ML IJ SOLN
INTRAMUSCULAR | Status: AC
  Filled 2019-08-28: qty 2

## 2019-08-28 MED ORDER — PROPOFOL 10 MG/ML IV BOLUS
INTRAVENOUS | Status: AC
  Filled 2019-08-28: qty 20

## 2019-08-28 MED ORDER — ONDANSETRON HCL 4 MG/2ML IJ SOLN
INTRAMUSCULAR | Status: AC
  Filled 2019-08-28: qty 2

## 2019-08-28 MED ORDER — FENTANYL CITRATE (PF) 100 MCG/2ML IJ SOLN
INTRAMUSCULAR | Status: AC
  Administered 2019-08-28: 25 ug via INTRAVENOUS
  Filled 2019-08-28: qty 2

## 2019-08-28 MED ORDER — ONDANSETRON HCL 4 MG/2ML IJ SOLN
INTRAMUSCULAR | Status: DC | PRN
  Administered 2019-08-28: 4 mg via INTRAVENOUS

## 2019-08-28 MED ORDER — PROPOFOL 10 MG/ML IV BOLUS
INTRAVENOUS | Status: DC | PRN
  Administered 2019-08-28: 20 mg via INTRAVENOUS
  Administered 2019-08-28: 150 mg via INTRAVENOUS
  Administered 2019-08-28: 30 mg via INTRAVENOUS

## 2019-08-28 MED ORDER — DEXAMETHASONE SODIUM PHOSPHATE 10 MG/ML IJ SOLN
INTRAMUSCULAR | Status: AC
  Filled 2019-08-28: qty 1

## 2019-08-28 MED ORDER — SODIUM CHLORIDE 0.9 % IV SOLN: INTRAVENOUS | Status: DC

## 2019-08-28 MED ORDER — SUCCINYLCHOLINE CHLORIDE 20 MG/ML IJ SOLN
INTRAMUSCULAR | Status: DC | PRN
  Administered 2019-08-28: 100 mg via INTRAVENOUS

## 2019-08-28 MED ORDER — SUGAMMADEX SODIUM 200 MG/2ML IV SOLN
INTRAVENOUS | Status: DC | PRN
  Administered 2019-08-28 (×2): 100 mg via INTRAVENOUS

## 2019-08-28 MED ORDER — CIPROFLOXACIN IN D5W 400 MG/200ML IV SOLN
INTRAVENOUS | Status: AC
  Filled 2019-08-28: qty 200

## 2019-08-28 MED ORDER — FAMOTIDINE 20 MG PO TABS: 20.0000 mg | ORAL_TABLET | Freq: Once | ORAL | Status: AC

## 2019-08-28 MED ORDER — FENTANYL CITRATE (PF) 100 MCG/2ML IJ SOLN
INTRAMUSCULAR | Status: DC | PRN
  Administered 2019-08-28 (×2): 50 ug via INTRAVENOUS

## 2019-08-28 MED ORDER — SUGAMMADEX SODIUM 200 MG/2ML IV SOLN
INTRAVENOUS | Status: AC
  Filled 2019-08-28: qty 2

## 2019-08-28 SURGICAL SUPPLY — 36 items
ADAPTER IRRIG TUBE 2 SPIKE SOL (ADAPTER) ×6 IMPLANT
ADPR TBG 2 SPK PMP STRL ASCP (ADAPTER) ×2
BAG DRN LRG CPC RND TRDRP CNTR (MISCELLANEOUS) ×1
BAG DRN RND TRDRP ANRFLXCHMBR (UROLOGICAL SUPPLIES) ×1
BAG URINE DRAIN 2000ML AR STRL (UROLOGICAL SUPPLIES) ×2 IMPLANT
BAG URO DRAIN 4000ML (MISCELLANEOUS) ×2 IMPLANT
CATH FOL 2WAY LX 20X30 (CATHETERS) ×2 IMPLANT
CATH FOL 2WAY LX 22X30 (CATHETERS) IMPLANT
CATH FOLEY 3WAY 30CC 22FR (CATHETERS) IMPLANT
CATH URETL 5X70 OPEN END (CATHETERS) ×3 IMPLANT
CONTAINER COLLECT MORCELLATR (MISCELLANEOUS) ×1 IMPLANT
DRAPE 3/4 80X56 (DRAPES) ×3 IMPLANT
DRAPE UTILITY 15X26 TOWEL STRL (DRAPES) ×2 IMPLANT
ELECT LOOP 22F BIPOLAR SML (ELECTROSURGICAL) ×3
ELECTRODE LOOP 22F BIPOLAR SML (ELECTROSURGICAL) IMPLANT
FILTER OVERFLOW MORCELLATOR (FILTER) ×1 IMPLANT
GLOVE BIO SURGEON STRL SZ 6.5 (GLOVE) ×4 IMPLANT
GLOVE BIO SURGEONS STRL SZ 6.5 (GLOVE) ×2
GOWN STRL REUS W/ TWL LRG LVL3 (GOWN DISPOSABLE) ×2 IMPLANT
GOWN STRL REUS W/TWL LRG LVL3 (GOWN DISPOSABLE) ×6
HOLDER FOLEY CATH W/STRAP (MISCELLANEOUS) ×3 IMPLANT
KIT TURNOVER CYSTO (KITS) ×3 IMPLANT
LASER FIBER 550M SMARTSCOPE (Laser) ×3 IMPLANT
MORCELLATOR COLLECT CONTAINER (MISCELLANEOUS) ×3
MORCELLATOR OVERFLOW FILTER (FILTER) ×3
MORCELLATOR ROTATION 4.75 335 (MISCELLANEOUS) ×3 IMPLANT
PACK CYSTO AR (MISCELLANEOUS) ×3 IMPLANT
SET CYSTO W/LG BORE CLAMP LF (SET/KITS/TRAYS/PACK) IMPLANT
SET IRRIG Y TYPE TUR BLADDER L (SET/KITS/TRAYS/PACK) ×3 IMPLANT
SLEEVE PROTECTION STRL DISP (MISCELLANEOUS) ×6 IMPLANT
SOL .9 NS 3000ML IRR  AL (IV SOLUTION) ×20
SOL .9 NS 3000ML IRR AL (IV SOLUTION) ×10
SOL .9 NS 3000ML IRR UROMATIC (IV SOLUTION) ×4 IMPLANT
SYRINGE IRR TOOMEY STRL 70CC (SYRINGE) ×3 IMPLANT
TUBE PUMP MORCELLATOR PIRANHA (TUBING) ×3 IMPLANT
WATER STERILE IRR 1000ML POUR (IV SOLUTION) ×3 IMPLANT

## 2019-08-28 NOTE — Transfer of Care (Signed)
Immediate Anesthesia Transfer of Care Note  Patient: Jonathan York  Procedure(s) Performed: HOLEP-LASER ENUCLEATION OF THE PROSTATE WITH MORCELLATION (N/A Prostate)  Patient Location: PACU  Anesthesia Type:General  Level of Consciousness: awake  Airway & Oxygen Therapy: Patient Spontanous Breathing  Post-op Assessment: Report given to RN  Post vital signs: stable  Last Vitals:  Vitals Value Taken Time  BP 164/81 08/28/19 0939  Temp    Pulse 83 08/28/19 0943  Resp 27 08/28/19 0943  SpO2 95 % 08/28/19 0943  Vitals shown include unvalidated device data.  Last Pain:  Vitals:   08/28/19 0609  TempSrc: Tympanic  PainSc: 0-No pain         Complications: No apparent anesthesia complications

## 2019-08-28 NOTE — Anesthesia Preprocedure Evaluation (Signed)
Anesthesia Evaluation  Patient identified by MRN, date of birth, ID band Patient awake    Reviewed: Allergy & Precautions, H&P , NPO status , Patient's Chart, lab work & pertinent test results, reviewed documented beta blocker date and time   History of Anesthesia Complications Negative for: history of anesthetic complications  Airway Mallampati: III  TM Distance: >3 FB Neck ROM: full    Dental  (+) Missing, Dental Advidsory Given, Teeth Intact   Pulmonary shortness of breath and with exertion, neg sleep apnea, COPD,  COPD inhaler, neg recent URI, former smoker,    Pulmonary exam normal        Cardiovascular Exercise Tolerance: Good hypertension, (-) angina(-) Past MI and (-) Cardiac Stents Normal cardiovascular exam+ dysrhythmias (-) Valvular Problems/Murmurs     Neuro/Psych neg Seizures TIAnegative psych ROS   GI/Hepatic Neg liver ROS, GERD  ,  Endo/Other  diabetes  Renal/GU negative Renal ROS  negative genitourinary   Musculoskeletal   Abdominal   Peds  Hematology negative hematology ROS (+)   Anesthesia Other Findings Past Medical History: 05/05/2012: Abdominal bruit No date: Arthritis     Comment:  back pain, past lumbar surgery  No date: BPH (benign prostatic hypertrophy) No date: Carotid arterial disease (HCC)     Comment:  a. s/p left-sided CEA No date: Chronic atrial fibrillation (HCC)     Comment:  a. CHADS2VASc => 5 (HTN, age x 2, DM, vascular disease);              Eliquis No date: COPD (chronic obstructive pulmonary disease) (HCC) No date: Diabetes mellitus type II     Comment:  diet controlled  No date: Dyspnea No date: HLD (hyperlipidemia) No date: HTN (hypertension) No date: Lung cancer ([place])     Comment:  a. s/p resection and chemo in 2002 2019: TIA (transient ischemic attack)   Reproductive/Obstetrics negative OB ROS                             Anesthesia  Physical Anesthesia Plan  ASA: III  Anesthesia Plan: General   Post-op Pain Management:    Induction: Intravenous  PONV Risk Score and Plan: 2 and Ondansetron, Dexamethasone and Treatment may vary due to age or medical condition  Airway Management Planned: Oral ETT and LMA  Additional Equipment:   Intra-op Plan:   Post-operative Plan: Extubation in OR  Informed Consent: I have reviewed the patients History and Physical, chart, labs and discussed the procedure including the risks, benefits and alternatives for the proposed anesthesia with the patient or authorized representative who has indicated his/her understanding and acceptance.     Dental Advisory Given  Plan Discussed with: Anesthesiologist, CRNA and Surgeon  Anesthesia Plan Comments:         Anesthesia Quick Evaluation

## 2019-08-28 NOTE — Op Note (Signed)
Date of procedure: 08/28/19  Preoperative diagnosis:  1. BPH with BOO 2. Urinary retention  Postoperative diagnosis:  1. same   Procedure: 1. HoLEP with morcellation  Surgeon: Hollice Espy, MD  Anesthesia: General  Complications: None  Intraoperative findings: Enlarged prostate with coapting lateral lobes, no significant median lobe.  EBL: 100 cc  Specimens: Prostate chips  Drains: 20 cc to a Foley catheter with 30 cc in the balloon  Indication: Jonathan York is a 86 y.o. patient with refractory urinary retention is failed multiple voiding trials.  After reviewing the management options for treatment, he elected to proceed with the above surgical procedure(s). We have discussed the potential benefits and risks of the procedure, side effects of the proposed treatment, the likelihood of the patient achieving the goals of the procedure, and any potential problems that might occur during the procedure or recuperation. Informed consent has been obtained.  Description of procedure:  The patient was taken to the operating room and general anesthesia was induced.  His Foley catheter was removed.  The patient was placed in the dorsal lithotomy position, prepped and draped in the usual sterile fashion, and preoperative antibiotics were administered. A preoperative time-out was performed.     A 26 French resectoscope sheath using a blunt angled obturator was introduced without difficulty into the bladder.  The bladder was carefully inspected and noted to be moderately trabeculated.  There is mildly elevated bladder neck without a significant intravesical component.  The trigone was able to be visualized with some manipulation and the UOs were good distance bladder neck itself.  There was some catheter cystitis on the posterior bladder wall as anticipated with his Foley catheter. The prostatic fossa had significant trilobar coaptation with greater than 5 cm prostatic length.  A 550 m laser  fiber was then brought in and using settings of 0.9 J's and 53 Hz, 2 incisions were created at the 5:00 and 7:00 positions of the bladder neck on either side of the median lobe down to the level of the bladder neck/capsular fibers.  This was relatively small decrease a nice open channel for improved flow. The incision was carried down caudally meeting in the midline just above the verumontanum.  The median lobe was then enucleated from a caudal to cranial direction cleaving the adenoma off the underlying capsule rolling it towards the bladder neck and ultimately cleaving the mucosa to free the median lobe into the bladder.   Next, a semilunar incision was created at the prostatic apex on the left side again freeing up the adenoma from the underlying capsule.  Care was taken to avoid any resection past the verumontanum.  This incision was carried around laterally and cranially towards the bladder neck.  Ultimately, I was able to complete the anterior commissure mucosa and the adenoma into the bladder creating a widely patent prostatic fossa.     Next, the same similar incision was created at the right prostatic apex.  This adenoma however ended up being enucleated and more of a piece wise fashion freeing up a large BPH nodules from the capsular fibers.  Once this was completed and cleared from the bladder neck, the prostatic fossa was noted to be widely patent.  Hemostasis was achieved using hemostatic fiber settings.  Bilateral UOs were visualized and free of any injury.  Finally, the 67 French resectoscope was exchanged for nephroscope and using the Piranha handpiece morcellator, the bladder was distended in each of the prostate chips were evacuated.  The  bladder was irrigated several times and smaller pieces evacuated.  This point time, there were no residual fibers appreciated in the bladder.  Hemostasis reasonable but not perfect.  As such, I brought in a bipolar loop and using the loop, fulgurated the  bladder neck fibers as well as the prostatic fossa for excellent hemostasis.  This was done particularly because the patient will need to resume anticoagulation as soon as possible. 10 mg of IV Lasix was administered to help with postoperative diuresis.  A 20 French two-way Foley catheter was then inserted over a catheter guide with 30 cc in the balloon.  The catheter irrigated easily and well.  Patient was then clean and dry, repositioned supine position, reversed from anesthesia, taken to PACU in stable condition.   Plan: Patient will return to the office in 7 days for voiding trial.      Hollice Espy, M.D.

## 2019-08-28 NOTE — Discharge Instructions (Signed)
AMBULATORY SURGERY  DISCHARGE INSTRUCTIONS   1) The drugs that you were given will stay in your system until tomorrow so for the next 24 hours you should not:  A) Drive an automobile B) Make any legal decisions C) Drink any alcoholic beverage   2) You may resume regular meals tomorrow.  Today it is better to start with liquids and gradually work up to solid foods.  You may eat anything you prefer, but it is better to start with liquids, then soup and crackers, and gradually work up to solid foods.   3) Please notify your doctor immediately if you have any unusual bleeding, trouble breathing, redness and pain at the surgery site, drainage, fever, or pain not relieved by medication.    4) Additional Instructions:        Please contact your physician with any problems or Same Day Surgery at [phone], Monday through Friday 6 am to 4 pm, or [place] [facility] number at [phone].   You have a catheter in place.  It will remain in place for about a week.  You are given Lovenox to take for the next 7 days.  Tomorrow morning, the day after the procedure, if your urine is light pink to clear, you may take this medication.  If not, please reach out to our office for instructions.    Indwelling Urinary Catheter Care, Adult An indwelling urinary catheter is a thin tube that is put into your bladder. The tube helps to drain pee (urine) out of your body. The tube goes in through your urethra. Your urethra is where pee comes out of your body. Your pee will come out through the catheter, then it will go into a bag (drainage bag). Take good care of your catheter so it will work well. How to wear your catheter and bag Supplies needed  Sticky tape (adhesive tape) or a leg strap.  Alcohol wipe or soap and water (if you use tape).  A clean towel (if you use tape).  Large overnight bag.  Smaller bag (leg bag).     Wearing your catheter Attach your catheter to your  leg with tape or a leg strap.  Make sure the catheter is not pulled tight.  If a leg strap gets wet, take it off and put on a dry strap.  If you use tape to hold the bag on your leg: 1. Use an alcohol wipe or soap and water to wash your skin where the tape made it sticky before. 2. Use a clean towel to pat-dry that skin. 3. Use new tape to make the bag stay on your leg. Wearing your bags You should have been given a large overnight bag.  You may wear the overnight bag in the day or night.  Always have the overnight bag lower than your bladder.  Do not let the bag touch the floor.  Before you go to sleep, put a clean plastic bag in a wastebasket. Then hang the overnight bag inside the wastebasket. You should also have a smaller leg bag that fits under your clothes.  Always wear the leg bag below your knee.  Do not wear your leg bag at night. How to care for your skin and catheter Supplies needed  A clean washcloth.  Water and mild soap.  A clean towel. Caring for your skin and catheter      Clean the skin around your catheter every day: 1. Wash your hands with soap and water.  2. Wet a clean washcloth in warm water and mild soap. 3. Clean the skin around your urethra.  If you are male:  Gently spread the folds of skin around your vagina (labia).  With the washcloth in your other hand, wipe the inner side of your labia on each side. Wipe from front to back.  If you are male:  Pull back any skin that covers the end of your penis (foreskin).  With the washcloth in your other hand, wipe your penis in small circles. Start wiping at the tip of your penis, then move away from the catheter.  Move the foreskin back in place, if needed. 4. With your free hand, hold the catheter close to where it goes into your body.  Keep holding the catheter during cleaning so it does not get pulled out. 5. With the washcloth in your other hand, clean the catheter.  Only wipe  downward on the catheter.  Do not wipe upward toward your body. Doing this may push germs into your urethra and cause infection. 6. Use a clean towel to pat-dry the catheter and the skin around it. Make sure to wipe off all soap. 7. Wash your hands with soap and water.  Shower every day. Do not take baths.  Do not use cream, ointment, or lotion on the area where the catheter goes into your body, unless your doctor tells you to.  Do not use powders, sprays, or lotions on your genital area.  Check your skin around the catheter every day for signs of infection. Check for: ? Redness, swelling, or pain. ? Fluid or blood. ? Warmth. ? Pus or a bad smell. How to empty the bag Supplies needed  Rubbing alcohol.  Gauze pad or cotton ball.  Tape or a leg strap. Emptying the bag Pour the pee out of your bag when it is ?- full, or at least 2-3 times a day. Do this for your overnight bag and your leg bag. 1. Wash your hands with soap and water. 2. Separate (detach) the bag from your leg. 3. Hold the bag over the toilet or a clean pail. Keep the bag lower than your hips and bladder. This is so the pee (urine) does not go back into the tube. 4. Open the pour spout. It is at the bottom of the bag. 5. Empty the pee into the toilet or pail. Do not let the pour spout touch any surface. 6. Put rubbing alcohol on a gauze pad or cotton ball. 7. Use the gauze pad or cotton ball to clean the pour spout. 8. Close the pour spout. 9. Attach the bag to your leg with tape or a leg strap. 10. Wash your hands with soap and water. Follow instructions for cleaning the drainage bag:  From the product maker.  As told by your doctor. How to change the bag Supplies needed  Alcohol wipes.  A clean bag.  Tape or a leg strap. Changing the bag Replace your bag when it starts to leak, smell bad, or look dirty. 1. Wash your hands with soap and water. 2. Separate the dirty bag from your leg. 3. Pinch the  catheter with your fingers so that pee does not spill out. 4. Separate the catheter tube from the bag tube where these tubes connect (at the connection valve). Do not let the tubes touch any surface. 5. Clean the end of the catheter tube with an alcohol wipe. Use a different alcohol wipe to clean the end  of the bag tube. 6. Connect the catheter tube to the tube of the clean bag. 7. Attach the clean bag to your leg with tape or a leg strap. Do not make the bag tight on your leg. 8. Wash your hands with soap and water. General rules   Never pull on your catheter. Never try to take it out. Doing that can hurt you.  Always wash your hands before and after you touch your catheter or bag. Use a mild, fragrance-free soap. If you do not have soap and water, use hand sanitizer.  Always make sure there are no twists or bends (kinks) in the catheter tube.  Always make sure there are no leaks in the catheter or bag.  Drink enough fluid to keep your pee pale yellow.  Do not take baths, swim, or use a hot tub.  If you are male, wipe from front to back after you poop (have a bowel movement). Contact a doctor if:  Your pee is cloudy.  Your pee smells worse than usual.  Your catheter gets clogged.  Your catheter leaks.  Your bladder feels full. Get help right away if:  You have redness, swelling, or pain where the catheter goes into your body.  You have fluid, blood, pus, or a bad smell coming from the area where the catheter goes into your body.  Your skin feels warm where the catheter goes into your body.  You have a fever.  You have pain in your: ? Belly (abdomen). ? Legs. ? Lower back. ? Bladder.  You see blood in the catheter.  Your pee is pink or red.  You feel sick to your stomach (nauseous).  You throw up (vomit).  You have chills.  Your pee is not draining into the bag.  Your catheter gets pulled out. Summary  An indwelling urinary catheter is a thin tube  that is placed into the bladder to help drain pee (urine) out of the body.  The catheter is placed into the part of the body that drains pee from the bladder (urethra).  Taking good care of your catheter will keep it working properly and help prevent problems.  Always wash your hands before and after touching your catheter or bag.  Never pull on your catheter or try to take it out. This information is not intended to replace advice given to you by your health care provider. Make sure you discuss any questions you have with your health care provider. Document Revised: 10/28/2018 Document Reviewed: 02/19/2017 Elsevier Patient Education  [place].

## 2019-08-28 NOTE — Progress Notes (Signed)
This visit occurred during the SARS-CoV-2 public health emergency.  Safety protocols were in place, including screening questions prior to the visit, additional usage of staff PPE, and extensive cleaning of exam room while observing appropriate contact time as indicated for disinfecting solutions.  He still has catheter in place after urology procedure this AM.    He doesn't wear contacts.  Sx started today.  Woke up with L eye bothering him in PACU.  No known trauma.  Vision is fine per patient report.  He had watering eye.  No R eye sx.  No fevers, no chills.    He has a few pills of bactrim left, to be used in the near future.  His eye already feels better in the meantime.  Blinking w/less discomfort.  Normal eye ROM.    Meds, vitals, and allergies reviewed.   ROS: Per HPI unless specifically indicated in ROS section   nad ncat PERRL, EOMI No foreign body in either eye noted.  Left eye stained and examined under black light after topical anesthesia without any foreign body noted or abnormal stain uptake noted.  He does have a small epithelial change that looks like a chronic pre-existing issue.  There is no abnormal stain uptake and it looks like it protrudes very minimally at the medial portion of the iris, on the outer edge, at 9:00 on the iris.  It is not in line with the pupil.

## 2019-08-28 NOTE — Interval H&P Note (Signed)
History and Physical Interval Note:  08/28/2019 7:21 AM  Jonathan York  has presented today for surgery, with the diagnosis of BPH with urinary retention.  The various methods of treatment have been discussed with the patient and family. After consideration of risks, benefits and other options for treatment, the patient has consented to  Procedure(s): [place] WITH MORCELLATION (N/A) as a surgical intervention.  The patient's history has been reviewed, patient examined, no change in status, stable for surgery.  I have reviewed the patient's chart and labs.  Questions were answered to the patient's satisfaction.    RRR CTAB  Taking bactrim Eliquis held  [employer]

## 2019-08-28 NOTE — Anesthesia Procedure Notes (Signed)
Procedure Name: Intubation Date/Time: 08/28/2019 7:49 AM Performed by: Zetta Bills, CRNA Pre-anesthesia Checklist: Patient identified, Emergency Drugs available, Suction available and Patient being monitored Patient Re-evaluated:Patient Re-evaluated prior to induction Oxygen Delivery Method: Circle system utilized Preoxygenation: Pre-oxygenation with 100% oxygen Induction Type: IV induction Ventilation: Mask ventilation without difficulty Laryngoscope Size: Mac and 4 Grade View: Grade I Tube type: Oral Tube size: 7.0 mm Number of attempts: 1 Airway Equipment and Method: Stylet Placement Confirmation: ETT inserted through vocal cords under direct vision,  positive ETCO2 and breath sounds checked- equal and bilateral Secured at: 22 cm Tube secured with: Tape Dental Injury: Teeth and Oropharynx as per pre-operative assessment

## 2019-08-28 NOTE — Patient Instructions (Signed)
Finish the antibiotics.  If you don't continue to improve then let me know.  Take care.  Glad to see you.

## 2019-08-29 ENCOUNTER — Telehealth: Payer: Self-pay | Admitting: *Deleted

## 2019-08-29 LAB — SURGICAL PATHOLOGY

## 2019-08-29 NOTE — Anesthesia Postprocedure Evaluation (Signed)
Anesthesia Post Note  Patient: Juluis Nicholas Trompeter  Procedure(s) Performed: HOLEP-LASER ENUCLEATION OF THE PROSTATE WITH MORCELLATION (N/A Prostate)  Patient location during evaluation: PACU Anesthesia Type: General Level of consciousness: awake and alert Pain management: pain level controlled Vital Signs Assessment: post-procedure vital signs reviewed and stable Respiratory status: spontaneous breathing, nonlabored ventilation, respiratory function stable and patient connected to nasal cannula oxygen Cardiovascular status: blood pressure returned to baseline and stable Postop Assessment: no apparent nausea or vomiting Anesthetic complications: no     Last Vitals:  Vitals:   08/28/19 1021 08/28/19 1046  BP: (!) 153/78 (!) 158/85  Pulse: 74 66  Resp: 16 16  Temp: (!) 36.2 C   SpO2: 96% 97%    Last Pain:  Vitals:   08/28/19 1046  TempSrc:   PainSc: 0-No pain                 Martha Clan

## 2019-08-29 NOTE — Telephone Encounter (Addendum)
Patient informed, verbalized understanding.   ----- Message from Hollice Espy, MD sent at 08/29/2019 12:34 PM EST ----- Pathology benign, good news!

## 2019-08-30 DIAGNOSIS — H579 Unspecified disorder of eye and adnexa: Secondary | ICD-10-CM | POA: Insufficient documentation

## 2019-08-30 NOTE — Assessment & Plan Note (Signed)
He does not have vision loss or foreign body.  He already feels some better in the meantime.  He has a likely old finding on the medial aspect of the left eye at the edge of the iris.  I do not know the cause of his symptoms.  I do not know if this is related to drying of the eye that could have happened around the time of his procedure.  I do not see anything ominous at this point.  I would continue his baseline medications as is.  He is already treated with Septra and he is going to finish that.  I want her to update the eye clinic if worse or if he has persistent symptoms.  I will update the eye clinic as FYI.

## 2019-08-31 ENCOUNTER — Ambulatory Visit: Payer: Medicare Other | Admitting: Urology

## 2019-09-02 ENCOUNTER — Ambulatory Visit: Payer: Medicare Other | Attending: Internal Medicine

## 2019-09-02 DIAGNOSIS — Z23 Encounter for immunization: Secondary | ICD-10-CM | POA: Insufficient documentation

## 2019-09-02 NOTE — Progress Notes (Signed)
   Covid-19 Vaccination Clinic  Name:  Jonathan York    MRN: [id number] DOB: 01-30-1933  09/02/2019  Jonathan York was observed post Covid-19 immunization for 30 minutes based on pre-vaccination screening without incidence. He was provided with Vaccine Information Sheet and instruction to access the V-Safe system.   Jonathan York was instructed to call 911 with any severe reactions post vaccine: Marland Kitchen Difficulty breathing  . Swelling of your face and throat  . A fast heartbeat  . A bad rash all over your body  . Dizziness and weakness    Immunizations Administered    Name Date Dose VIS Date Route   Pfizer COVID-19 Vaccine 09/02/2019 11:47 AM 0.3 mL 06/30/2019 Intramuscular   Manufacturer: [place]   Lot: [id number]   [place]: [id number]

## 2019-09-04 ENCOUNTER — Other Ambulatory Visit: Payer: Self-pay

## 2019-09-04 ENCOUNTER — Encounter: Payer: Self-pay | Admitting: Physician Assistant

## 2019-09-04 ENCOUNTER — Ambulatory Visit: Payer: Medicare Other | Admitting: Physician Assistant

## 2019-09-04 ENCOUNTER — Ambulatory Visit (INDEPENDENT_AMBULATORY_CARE_PROVIDER_SITE_OTHER): Payer: Medicare Other | Admitting: Physician Assistant

## 2019-09-04 VITALS — BP 97/64 | HR 130 | Ht 75.0 in | Wt 251.0 lb

## 2019-09-04 DIAGNOSIS — R338 Other retention of urine: Secondary | ICD-10-CM | POA: Diagnosis not present

## 2019-09-04 DIAGNOSIS — N401 Enlarged prostate with lower urinary tract symptoms: Secondary | ICD-10-CM | POA: Diagnosis not present

## 2019-09-04 LAB — BLADDER SCAN AMB NON-IMAGING: SCA Result: 28

## 2019-09-04 NOTE — Progress Notes (Signed)
Fill and Pull Catheter Removal  Patient is present today for a catheter removal.  Patient was cleaned and prepped in a sterile fashion 128ml of sterile saline was instilled into the bladder when the patient felt the urge to urinate. 20ml of water was then drained from the balloon.  A 20FR foley cath was removed from the bladder no complications were noted .  Patient as then given some time to void on their own.  Patient can void  168ml on their own after some time.  Patient tolerated well.  Performed by: Debroah Loop, PA-C   Follow up/ Additional notes: Push fluids and RTC this afternoon for PVR. Shared negative pathology results with the patient; he expressed understanding.

## 2019-09-04 NOTE — Progress Notes (Signed)
Afternoon Voiding Trial Follow-up  Patient returned to clinic this afternoon.  He reports drinking 40oz of water and has been able to urinate.  PVR 73mL.  Voiding trial passed.  Patient to RTC in 6 weeks for postop check. Counseled him to follow up with cardiology regarding transitioning back to Eliquis; has been on Lovenox bridge postoperatively. He expressed understanding.  Results for orders placed or performed in visit on 09/04/19  BLADDER SCAN AMB NON-IMAGING  Result Value Ref Range   SCA Result 28

## 2019-09-12 ENCOUNTER — Ambulatory Visit: Payer: Self-pay | Admitting: Physician Assistant

## 2019-09-20 ENCOUNTER — Telehealth: Payer: Self-pay | Admitting: Family Medicine

## 2019-09-20 DIAGNOSIS — R911 Solitary pulmonary nodule: Secondary | ICD-10-CM

## 2019-09-20 NOTE — Telephone Encounter (Signed)
Notify patient.  Due for follow-up CT chest.  I put in the order.  Thanks.

## 2019-09-20 NOTE — Telephone Encounter (Signed)
error 

## 2019-09-21 NOTE — Telephone Encounter (Signed)
Already been scheduled.

## 2019-09-23 ENCOUNTER — Ambulatory Visit: Payer: Medicare Other | Attending: Internal Medicine

## 2019-09-23 ENCOUNTER — Other Ambulatory Visit: Payer: Self-pay

## 2019-09-23 DIAGNOSIS — Z23 Encounter for immunization: Secondary | ICD-10-CM | POA: Insufficient documentation

## 2019-09-23 NOTE — Progress Notes (Signed)
   Covid-19 Vaccination Clinic  Name:  Jonathan York    MRN: [id number] DOB: 28-Jun-1933  09/23/2019  Mr. Faria was observed post Covid-19 immunization for 15 minutes without incident. He was provided with Vaccine Information Sheet and instruction to access the V-Safe system.   Mr. Driskill was instructed to call 911 with any severe reactions post vaccine: Marland Kitchen Difficulty breathing  . Swelling of face and throat  . A fast heartbeat  . A bad rash all over body  . Dizziness and weakness   Immunizations Administered    Name Date Dose VIS Date Route   Pfizer COVID-19 Vaccine 09/23/2019 11:41 AM 0.3 mL 06/30/2019 Intramuscular   Manufacturer: [place]   Lot: [id number]   [place]: [id number]

## 2019-09-25 ENCOUNTER — Other Ambulatory Visit: Payer: Self-pay

## 2019-09-25 ENCOUNTER — Ambulatory Visit: Payer: Medicare Other | Admitting: Podiatry

## 2019-09-25 ENCOUNTER — Encounter: Payer: Self-pay | Admitting: Podiatry

## 2019-09-25 DIAGNOSIS — E119 Type 2 diabetes mellitus without complications: Secondary | ICD-10-CM

## 2019-09-25 DIAGNOSIS — L603 Nail dystrophy: Secondary | ICD-10-CM

## 2019-09-25 DIAGNOSIS — D689 Coagulation defect, unspecified: Secondary | ICD-10-CM | POA: Diagnosis not present

## 2019-09-25 NOTE — Progress Notes (Signed)
Complaint:  Visit Type: Patient returns to my office for continued preventative foot care services. Complaint: Patient states" my nails have grown long and thick and become painful to walk and wear shoes".  He says his great toenail and second toenails are thick and curved and painful. Patient has been diagnosed with DM with no foot complications. The patient presents for preventative foot care services. No changes to ROS.  Patient is taking eliquiss.  Podiatric Exam: Vascular: dorsalis pedis and posterior tibial pulses are palpable bilateral. Capillary return is immediate. Temperature gradient is WNL. Skin turgor WNL  Sensorium: Normal Semmes Weinstein monofilament test. Normal tactile sensation bilaterally. Nail Exam: Pt has thick disfigured discolored nails with subungual debris noted  1R and 2R Ulcer Exam: There is no evidence of ulcer or pre-ulcerative changes or infection. Orthopedic Exam: Muscle tone and strength are WNL. No limitations in general ROM. No crepitus or effusions noted. Foot type and digits show no abnormalities. Bony prominences are unremarkable. Skin: No Porokeratosis. No infection or ulcers  Diagnosis:  Onychomycosis, , Pain in right toe  Treatment & Plan Procedures and Treatment: Consent by patient was obtained for treatment procedures.   Debridement of mycotic and hypertrophic toenails, 1 through 2 right foot. and clearing of subungual debris. No ulceration, no infection noted.  Return Visit-Office Procedure: Patient instructed to return to the office for a follow up visit 3 months for continued evaluation and treatment.    Gardiner Barefoot DPM

## 2019-09-29 ENCOUNTER — Ambulatory Visit
Admission: RE | Admit: 2019-09-29 | Discharge: 2019-09-29 | Disposition: A | Discharge status: Home / Self Care | Payer: Medicare Other | Source: Ambulatory Visit | Attending: Family Medicine | Admitting: Family Medicine

## 2019-09-29 ENCOUNTER — Other Ambulatory Visit: Payer: Self-pay

## 2019-09-29 DIAGNOSIS — R911 Solitary pulmonary nodule: Secondary | ICD-10-CM | POA: Insufficient documentation

## 2019-10-02 ENCOUNTER — Ambulatory Visit: Payer: Medicare Other | Admitting: Podiatry

## 2019-10-09 NOTE — Progress Notes (Signed)
10/10/19 4:24 PM   Jonathan York 09/24/1932 [id number]  Referring provider: Tonia Ghent, MD [address] [place],  [place] [address]  Chief Complaint  Patient presents with  . Routine Post Op    HPI: Jonathan York is a 86 y.o. white M who returns today for a 6 week f/u s/p HoLEP for the evaluation and management of BPH with urinary retention.   He had urinary retention with multiple medical management and recurrent gross hematuria pre-op.   He underwent HoLEP on 08/28/19. Surgical pathology reviewed, 52 g resection, benign tissue. Estimated prostate volume preoperatively 98 g.  He reports of not emptying his bladder all the way however PVR is 85 mL. He also reports of leakage with no awareness usually when getting up from chair or walking to bathroom.    He denies gross hematuria and is currently back on Eliquis.   He is currently using Flomax.   Overall very pleased without come.  Happy to be rid of catheter.  His wife had recently passed away.   IPSS    Row Name 10/10/19 1100         International Prostate Symptom Score   How often have you had the sensation of not emptying your bladder?  About half the time     How often have you had to urinate less than every two hours?  Less than half the time     How often have you found you stopped and started again several times when you urinated?  About half the time     How often have you found it difficult to postpone urination?  More than half the time     How often have you had a weak urinary stream?  Less than 1 in 5 times     How often have you had to strain to start urination?  Less than half the time     How many times did you typically get up at night to urinate?  1 Time     Total IPSS Score  16       Quality of Life due to urinary symptoms   If you were to spend the rest of your life with your urinary condition just the way it is now how would you feel about that?  Mostly Disatisfied        Score:    1-7 Mild 8-19 Moderate 20-35 Severe  PMH: Past Medical History:  Diagnosis Date  . Abdominal bruit 05/05/2012  . Arthritis    back pain, past lumbar surgery   . BPH (benign prostatic hypertrophy)   . Carotid arterial disease (HCC)    a. s/p left-sided CEA  . Chronic atrial fibrillation ([place])    a. CHADS2VASc => 5 (HTN, age x 2, DM, vascular disease); Eliquis  . COPD (chronic obstructive pulmonary disease) ([place])   . Diabetes mellitus type II    diet controlled   . Dyspnea   . HLD (hyperlipidemia)   . HTN (hypertension)   . Lung cancer ([facility])    a. s/p resection and chemo in 2002  . TIA (transient ischemic attack) 2019    Surgical History: Past Surgical History:  Procedure Laterality Date  . ARCH AORTOGRAM  05/27/2012   Procedure: ARCH AORTOGRAM;  Surgeon: Elam Dutch, MD;  Location: [facility] CATH LAB;  Service: Cardiovascular;;  . BACK SURGERY    . CARDIOVERSION  06/29/2012   Procedure: CARDIOVERSION;  Surgeon: Thayer Headings, MD;  Location: [facility];  Service: Cardiovascular;  Laterality: N/A;  . CAROTID ANGIOGRAM N/A 05/27/2012   Procedure: CAROTID ANGIOGRAM;  Surgeon: Elam Dutch, MD;  Location: [facility] CATH LAB;  Service: Cardiovascular;  Laterality: N/A;  . CAROTID ENDARTERECTOMY Left 05/30/12  . CATARACT EXTRACTION     right  . ENDARTERECTOMY  05/30/2012   Procedure: ENDARTERECTOMY CAROTID;  Surgeon: Elam Dutch, MD;  Location: [facility];  Service: Vascular;  Laterality: Left;  . ENDARTERECTOMY Right 04/25/2018   Procedure: ENDARTERECTOMY RIGHT CAROTID;  Surgeon: Elam Dutch, MD;  Location: [facility];  Service: Vascular;  Laterality: Right;  . EYE SURGERY     R detached retina- 1990's, IOL implants also   . [place]  . HOLEP-LASER ENUCLEATION OF THE PROSTATE WITH MORCELLATION N/A 08/28/2019   Procedure: HOLEP-LASER ENUCLEATION OF THE PROSTATE WITH MORCELLATION;  Surgeon: Hollice Espy, MD;  Location: [facility];  Service: Urology;  Laterality:  N/A;  . JOINT REPLACEMENT     knee- bilateral- 2009 and 2012  . LOBECTOMY  03/15/01   [facility])  . NOSE SURGERY    . PATCH ANGIOPLASTY Right 04/25/2018   Procedure: PATCH ANGIOPLASTY RIGHT CAROTID ARTERY USING HEMASHILED PLATINUM FINESSE PATCH;  Surgeon: Elam Dutch, MD;  Location: [facility];  Service: Vascular;  Laterality: Right;  . RETINAL DETACHMENT SURGERY     right  . SPINE SURGERY  approx 2006   Ruptered Disc  . TONSILLECTOMY     1939  . TOTAL KNEE ARTHROPLASTY     x2 per Dr. Rush Farmer  . WEDGE RESECTION  03/15/01   [facility])    Home Medications:  Allergies as of 10/10/2019      Reactions   Xarelto [rivaroxaban] Other (See Comments)   vertigo      Medication List       Accurate as of October 10, 2019  4:24 PM. If you have any questions, ask your nurse or doctor.        STOP taking these medications   sulfamethoxazole-trimethoprim 800-160 MG tablet Commonly known as: BACTRIM DS Stopped by: Hollice Espy, MD   tamsulosin 0.4 MG Caps capsule Commonly known as: FLOMAX Stopped by: Hollice Espy, MD     TAKE these medications   amLODipine 5 MG tablet Commonly known as: NORVASC Take 1 tablet by mouth once daily   atorvastatin 80 MG tablet Commonly known as: LIPITOR TAKE 1 TABLET BY MOUTH AT BEDTIME   Eliquis 5 MG Tabs tablet Generic drug: apixaban Take 1 tablet by mouth twice daily   enoxaparin 100 MG/ML injection Commonly known as: LOVENOX Inject 1 mL (100 mg total) into the skin every 12 (twelve) hours.   fish oil-omega-3 fatty acids 1000 MG capsule Take 1 g by mouth 3 (three) times daily.   furosemide 20 MG tablet Commonly known as: LASIX Take 1-2 tablets (20-40 mg total) by mouth daily as needed for fluid.   metFORMIN 500 MG tablet Commonly known as: GLUCOPHAGE Take 1 tablet (500 mg total) by mouth 2 (two) times daily with a meal.   methocarbamol 500 MG tablet Commonly known as: ROBAXIN TAKE 1 TABLET BY MOUTH AT BEDTIME AS  NEEDED FOR RESTLESS LEGS   MiraLax 17 g packet Generic drug: polyethylene glycol Take 17 g by mouth daily as needed (constipation).   PRESERVISION AREDS 2 PO Take 1 capsule by mouth 2 (two) times daily.   ramipril 10 MG capsule Commonly known as: ALTACE Take 1 capsule (10 mg total)  by mouth daily.   Trelegy Ellipta 100-62.5-25 MCG/INH Aepb Generic drug: Fluticasone-Umeclidin-Vilant Inhale 1 applicator into the lungs daily. Rinse mouth after use.       Allergies:  Allergies  Allergen Reactions  . Xarelto [Rivaroxaban] Other (See Comments)    vertigo    Family History: Family History  Problem Relation Age of Onset  . Emphysema Father   . Dementia Mother   . Diabetes Mother   . Hypertension Mother   . Hypothyroidism Sister   . Dementia Brother   . Hypertension Brother   . Diabetes Son   . Heart disease Son        Heart Disease before age 61- Open Heart 2010  . Hypertension Son   . Stroke Son        while on coumadin  . Prostate cancer Neg Hx   . Colon cancer Neg Hx     Social History:  reports that he quit smoking about 37 years ago. His smoking use included cigarettes. He has a 32.00 pack-year smoking history. He has never used smokeless tobacco. He reports that he does not drink alcohol or use drugs.   Physical Exam: BP (!) 198/97   Pulse 79   Ht 6\' 3"  (1.905 m)   Wt 251 lb (113.9 kg)   BMI 31.37 kg/m   Constitutional:  Alert and oriented, No acute distress. HEENT: [place] AT, moist mucus membranes.  Trachea midline, no masses. Cardiovascular: No clubbing, cyanosis, or edema. Respiratory: Normal respiratory effort, no increased work of breathing. Skin: No rashes, bruises or suspicious lesions. Neurologic: Grossly intact, no focal deficits, moving all 4 extremities. Psychiatric: Normal mood and affect.  Laboratory Data: SURGICAL PATHOLOGY  CASE: [id number]  PATIENT: Jonathan York  Surgical Pathology Report      Specimen Submitted:  A. Prostate  chips   Clinical History: BPH with urinary retention     DIAGNOSIS:  A. PROSTATE GLAND; TRANSURETHRAL RESECTION:  - BENIGN PROSTATIC TISSUE WITH NODULAR HYPERTROPHY (BENIGN PROSTATIC  HYPERPLASIA).  - NEGATIVE FOR HIGH-GRADE PROSTATIC INTRAEPITHELIAL NEOPLASIA AND  MALIGNANCY.   GROSS DESCRIPTION:  A. Labeled: Prostate chips  Received: In formalin  Type of procedure: TURP  Weight: 52 grams  Size: 8 x 8 x 4.0 cm  Description: Unoriented white rubbery soft tissue fragments  Percentage of tissue submitted for microscopic review: 70%   Block summary:  1 -15-multiple representative sections      Final Diagnosis performed by Allena Napoleon, MD.  Electronically signed  08/29/2019 11:08:24AM  The electronic signature indicates that the named Attending Pathologist  has evaluated the specimen  Technical component performed at [facility], [address] [place],  [place] [address] Lab: [phone] Dir: Rush Farmer, MD, MMM  Professional component performed at [facility], [facility], [place], [place], [place] [address] Lab: [phone]  Dir: Dellia Nims. Reuel Derby, MD   I have reviewed surgical pathology report.   Pertinent Imaging: Results for orders placed or performed in visit on 10/10/19  Bladder Scan (Post Void Residual) in office  Result Value Ref Range   Scan Result 85     Assessment & Plan:    1. Benign prostatic hyperplasia with urinary retention  S/p HoLEP succesful - voiding without foley Significant improvement obstructive urinary symptoms.  Discontinue Flomax  Reassured pt to anticipate gradual improvement. Recheck symptoms in 6 months with PVR or sooner if needed    2. History of urinary retention  Adequate bladder emptying which is reassuring    3.  Stress incontinence of urine Normal post-op, anticipate gradual improvement  Recommended pelvic floor exercises with PT however pt declined F/u in 6 months IPSS/PVR  Return in about 6  months (around 04/11/2020) for IPSS/ PVR.  [facility] [address], [place], [place] [address] [phone]  I, Lucas Mallow, am acting as a scribe for Dr. Hollice Espy,  I have reviewed the above documentation for accuracy and completeness, and I agree with the above.   Hollice Espy, MD

## 2019-10-10 ENCOUNTER — Other Ambulatory Visit: Payer: Self-pay

## 2019-10-10 ENCOUNTER — Encounter: Payer: Self-pay | Admitting: Urology

## 2019-10-10 ENCOUNTER — Ambulatory Visit: Payer: Medicare Other | Admitting: Urology

## 2019-10-10 VITALS — BP 198/97 | HR 79 | Ht 75.0 in | Wt 251.0 lb

## 2019-10-10 DIAGNOSIS — Z87898 Personal history of other specified conditions: Secondary | ICD-10-CM

## 2019-10-10 DIAGNOSIS — N401 Enlarged prostate with lower urinary tract symptoms: Secondary | ICD-10-CM

## 2019-10-10 DIAGNOSIS — N393 Stress incontinence (female) (male): Secondary | ICD-10-CM

## 2019-10-10 DIAGNOSIS — R338 Other retention of urine: Secondary | ICD-10-CM

## 2019-10-10 LAB — BLADDER SCAN AMB NON-IMAGING: Scan Result: 85

## 2019-10-10 NOTE — Patient Instructions (Signed)
Pelvic Floor Muscle Exercises  More commonly called "Kegel" exercises: they can strengthen the muscles that hold urine inside the bladder.  Here's how you do them:   - Imagine that you are trying to control passing gas   - Pull in or tighten your pelvic muscles and hold for a count of 3 (you should feel a lifting sensation in the area around your vagina or pulling in your rectum)   - Repeat 10 to 15 times, at least 3 times a day   - Each time you do these exercises, alternate your position between lying, sitting and standing  Talk to you health care professional to make sure you are doing the exercise the right way.     Lie with hips and knees bent.  On an exhale, squeeze anal sphincter and hold for 3 seconds.  Release and fully relax pelvic floor.  Repeat 3 times.  Perform one set each hour during the day.  Also in this position, lie with hips and knees bent.  Slowly inhale, and then exhale.  On the exhale, contract anal sphincter, pull navel to spine and contract buttocks.  Hold for 5 seconds.  Repeat 5 times.  Perform one set each hour of the day in either lying, seated or standing.  While still laying lie with hips and knees bent. Slowly inhale and then exhale while pulling navel toward spine, holding 5 seconds.  Relax, repeat 5 times.  Perform one set each hour daytime in any position lying seated or standing.  Sitting on an exhale squeeze anal sphincter and hold for 3 seconds.  Release and fully relax pelvic floor.  Repeat 3 times.  Perform one set each hour daytime.  Standing on an exhale squeeze anal sphincter and hold for 3 seconds.  Release and fully relax pelvic floor.  Repeat 3 times.  Perform one set each hour daytime.

## 2019-10-15 ENCOUNTER — Encounter: Payer: Self-pay | Admitting: Family Medicine

## 2019-10-23 ENCOUNTER — Ambulatory Visit (INDEPENDENT_AMBULATORY_CARE_PROVIDER_SITE_OTHER): Payer: Medicare Other | Admitting: Family Medicine

## 2019-10-23 ENCOUNTER — Ambulatory Visit (INDEPENDENT_AMBULATORY_CARE_PROVIDER_SITE_OTHER)
Admission: RE | Admit: 2019-10-23 | Discharge: 2019-10-23 | Disposition: A | Discharge status: Home / Self Care | Payer: Medicare Other | Source: Ambulatory Visit | Attending: Family Medicine | Admitting: Family Medicine

## 2019-10-23 ENCOUNTER — Other Ambulatory Visit: Payer: Self-pay

## 2019-10-23 ENCOUNTER — Encounter: Payer: Self-pay | Admitting: Family Medicine

## 2019-10-23 VITALS — BP 142/82 | HR 84 | Temp 97.3°F | Ht 75.0 in | Wt 240.1 lb

## 2019-10-23 DIAGNOSIS — E119 Type 2 diabetes mellitus without complications: Secondary | ICD-10-CM

## 2019-10-23 DIAGNOSIS — M79662 Pain in left lower leg: Secondary | ICD-10-CM

## 2019-10-23 DIAGNOSIS — R600 Localized edema: Secondary | ICD-10-CM | POA: Diagnosis not present

## 2019-10-23 NOTE — Progress Notes (Signed)
This visit occurred during the SARS-CoV-2 public health emergency.  Safety protocols were in place, including screening questions prior to the visit, additional usage of staff PPE, and extensive cleaning of exam room while observing appropriate contact time as indicated for disinfecting solutions.  Leg pain.  Started about 3 weeks ago.  No bruising, no trauma.  L lateral shin, not the calf or anterior shin.  Still anticoagulated.  No R leg pain.  No back pain.  No knee or ankle pain.  ttp locally but not painful o/w.  Some discomfort with walking, noted recently.  No clear trigger for pain.    He is taking lasix BID.  Still with 1+ BLE edema.  His urination is better, he is tapering off flomax and catheter is out.    His wife died this year, condolences offered.   D/w pt.    DM2.  Due for f/u A1c.  D/w pt.  Still on metformin at baseline.  No ADE on med.  See notes on labs.    Meds, vitals, and allergies reviewed.   ROS: Per HPI unless specifically indicated in ROS section   GEN: nad, alert and oriented HEENT: ncat NECK: supple w/o LA CV: rrr PULM: ctab, no inc wob ABD: soft, +bs EXT: 1+ BLE edema SKIN: no acute rash but L shin ttp, L calf not ttp.  No spreading erythema. No fluctuant mass.

## 2019-10-23 NOTE — Patient Instructions (Addendum)
Go to the lab on the way out.   If you have mychart we'll likely use that to update you.    Try using ice for 5 minutes at a time.  Take tylenol for pain.   Update me if not better.   Try OTC compression stockings and see if that helps.  Take care.  Glad to see you.

## 2019-10-24 LAB — BASIC METABOLIC PANEL
BUN: 17 mg/dL (ref 6–23)
CO2: 30 mEq/L (ref 19–32)
Calcium: 9.7 mg/dL (ref 8.4–10.5)
Chloride: 102 mEq/L (ref 96–112)
Creatinine, Ser: 1 mg/dL (ref 0.40–1.50)
GFR: 71.12 mL/min (ref >60.00)
Glucose, Bld: 145 mg/dL — ABNORMAL HIGH (ref 70–99)
Potassium: 4.2 mEq/L (ref 3.5–5.1)
Sodium: 139 mEq/L (ref 135–145)

## 2019-10-24 LAB — HEMOGLOBIN A1C: Hgb A1c MFr Bld: 7.5 % — ABNORMAL HIGH (ref 4.6–6.5)

## 2019-10-25 DIAGNOSIS — M79662 Pain in left lower leg: Secondary | ICD-10-CM | POA: Insufficient documentation

## 2019-10-25 NOTE — Assessment & Plan Note (Signed)
No change in metformin, see notes on A1c and Cr.

## 2019-10-25 NOTE — Assessment & Plan Note (Addendum)
Not likely to be DVT w/o calf pain and on eliquis.  Check plain films but this could be muscle irritation on the shin.  Still okay for outpatient f/u. Can try using ice for 5 minutes at a time.  Take tylenol for pain.   Update me if not better.   Can try OTC compression stockings and see if that helps.

## 2019-11-07 DIAGNOSIS — H2513 Age-related nuclear cataract, bilateral: Secondary | ICD-10-CM | POA: Diagnosis not present

## 2019-11-09 NOTE — Progress Notes (Signed)
Office Visit    Patient Name: Jonathan York Date of Encounter: 11/10/2019  Primary Care Provider:  Tonia Ghent, MD Primary Cardiologist:  Kathlyn Sacramento, MD Electrophysiologist:  None   Chief Complaint    Jonathan York is a 86 y.o. male with a hx of chronic atrial fibrillation, HTN, HLD, DM2, carotid disease s/p bilateral carotid endarterectomy, lung cancer s/p resection and chemo in 2002, previous syncopal episode in setting of choking felt to be vasovagal, chronic diastolic heart failure presents today for 62-month follow-up of his atrial fibrillation  Past Medical History    Past Medical History:  Diagnosis Date  . Abdominal bruit 05/05/2012  . Arthritis    back pain, past lumbar surgery   . BPH (benign prostatic hypertrophy)   . Carotid arterial disease (HCC)    a. s/p left-sided CEA  . Chronic atrial fibrillation ([place])    a. CHADS2VASc => 5 (HTN, age x 2, DM, vascular disease); Eliquis  . COPD (chronic obstructive pulmonary disease) ([place])   . Diabetes mellitus type II    diet controlled   . Dyspnea   . HLD (hyperlipidemia)   . HTN (hypertension)   . Lung cancer ([place])    a. s/p resection and chemo in 2002  . TIA (transient ischemic attack) 2019   Past Surgical History:  Procedure Laterality Date  . ARCH AORTOGRAM  05/27/2012   Procedure: ARCH AORTOGRAM;  Surgeon: Elam Dutch, MD;  Location: [facility] CATH LAB;  Service: Cardiovascular;;  . BACK SURGERY    . CARDIOVERSION  06/29/2012   Procedure: CARDIOVERSION;  Surgeon: Thayer Headings, MD;  Location: [place];  Service: Cardiovascular;  Laterality: N/A;  . CAROTID ANGIOGRAM N/A 05/27/2012   Procedure: CAROTID Cyril Loosen;  Surgeon: Elam Dutch, MD;  Location: [facility] CATH LAB;  Service: Cardiovascular;  Laterality: N/A;  . CAROTID ENDARTERECTOMY Left 05/30/12  . CATARACT EXTRACTION     right  . ENDARTERECTOMY  05/30/2012   Procedure: ENDARTERECTOMY CAROTID;  Surgeon: Elam Dutch, MD;  Location: [place];  Service: Vascular;  Laterality: Left;  . ENDARTERECTOMY Right 04/25/2018   Procedure: ENDARTERECTOMY RIGHT CAROTID;  Surgeon: Elam Dutch, MD;  Location: [place];  Service: Vascular;  Laterality: Right;  . EYE SURGERY     R detached retina- 1990's, IOL implants also   . [place]  . HOLEP-LASER ENUCLEATION OF THE PROSTATE WITH MORCELLATION N/A 08/28/2019   Procedure: HOLEP-LASER ENUCLEATION OF THE PROSTATE WITH MORCELLATION;  Surgeon: Hollice Espy, MD;  Location: [facility] ORS;  Service: Urology;  Laterality: N/A;  . JOINT REPLACEMENT     knee- bilateral- 2009 and 2012  . LOBECTOMY  03/15/01   LUL Roxan Hockey)  . NOSE SURGERY    . PATCH ANGIOPLASTY Right 04/25/2018   Procedure: PATCH ANGIOPLASTY RIGHT CAROTID ARTERY USING HEMASHILED PLATINUM FINESSE PATCH;  Surgeon: Elam Dutch, MD;  Location: [place];  Service: Vascular;  Laterality: Right;  . RETINAL DETACHMENT SURGERY     right  . SPINE SURGERY  approx 2006   Ruptered Disc  . TONSILLECTOMY     1939  . TOTAL KNEE ARTHROPLASTY     x2 per Dr. Rush Farmer  . WEDGE RESECTION  03/15/01   LUL Roxan Hockey)    Allergies  Allergies  Allergen Reactions  . Xarelto [Rivaroxaban] Other (See Comments)    vertigo    History of Present Illness    Jonathan York is a 86 y.o. male with a hx  of chronic atrial fibrillation, HTN, HLD, DM2 carotid disease s/p bilateral carotid endarterectomy, lung cancer s/p resection and chemo in 2002, previous syncopal episode in setting of choking felt to be vasovagal, chronic diastolic heart failure CVA/TIA in 2019 (L pontine infarct). He was last seen by Dr. Fletcher Anon 05/11/2019.  Noted mild volume overload early 2020 that responded to small dose first night.  Echo April 2020 with LVEF 60-65% with indeterminate diastolic dysfunction and mild pulmonary hypertension.   At last office visit he was noted to be having financial difficulties with Eliquis and was taking the medication once per day  instead of twice daily as prescribed.  He declined transitioning to warfarin.  Since last seen he lost in wife in October. He has had holep-laser enucleation of the prostate on 08/28/19.Marland Kitchen CT chest 10/08/19 as part of lung cancer screening shows no significant change in solid part lesion in RUL, aortic atherosclerosis, emphysema, and coronary artery calcifications in LM, LAD, LCx, and RCA.   He reports no chest pain, pressure, tightness.  Reports no shortness of breath at rest.  Notes some dyspnea on exertion and shortness of breath when he bends over but he attributes this to his recent diagnosis of COPD.  Reports no wheeze or cough.  Tells me he has been taking his Eliquis only once per day in the morning and taking an aspirin 325 mg in the evening. We had a very long discussion regarding atrial fibrillation and risk of stroke.  Discussed that aspirin would not adequately protect from stroke.  He shares with me that his son has had a stroke in the past with residual left-sided paralysis.  We discussed transitioning to Coumadin.  He tells me his son is on Coumadin and he would prefer not to transition to medication requiring monitoring.  He assures me he will take his Eliquis twice per day.  EKGs/Labs/Other Studies Reviewed:   The following studies were reviewed today:  Carotid duplex 05/2019 Bilateral Carotid: Patent bilateral carotid endarterectomy with velocity  in the 1-39% stenosis range.   Echo 11/10/18 1. The left ventricle has normal systolic function with an ejection  fraction of 60-65%. The cavity size was normal. There is moderately  increased left ventricular wall thickness. Left ventricular diastolic  Doppler parameters are indeterminate.   2. The right ventricle has normal systolic function. The cavity was  normal. There is no increase in right ventricular wall thickness. Right  ventricular systolic pressure is mildly elevated with an estimated  pressure of 41.6 mmHg.   3. Left  atrial size was moderately dilated.   4. The mitral valve is grossly normal. Mild calcification of the mitral  valve leaflet.   5. Moderate calcification of the aortic valve. Very mild of the aortic  valve.   6. There is dilatation of the aortic root and of the ascending aorta, 3.8  cm   EKG:  EKG is ordered today.  The ekg ordered today demonstrates rate controlled atrial fibrillation 68 bpm with single PVC and possible old septal infarct, stable compared to previous.   Recent Labs: 11/23/2018: Pro B Natriuretic peptide (BNP) 177.0 03/13/2019: ALT 29 08/18/2019: Hemoglobin 13.5; Platelets 248 10/23/2019: BUN 17; Creatinine, Ser 1.00; Potassium 4.2; Sodium 139  Recent Lipid Panel    Component Value Date/Time   CHOL 171 03/13/2019 0945   TRIG 169.0 (H) 03/13/2019 0945   HDL 51.70 03/13/2019 0945   CHOLHDL 3 03/13/2019 0945   VLDL 33.8 03/13/2019 0945   LDLCALC 85 03/13/2019 0945  Home Medications   Current Meds  Medication Sig  . amLODipine (NORVASC) 5 MG tablet Take 1 tablet by mouth once daily  . atorvastatin (LIPITOR) 80 MG tablet TAKE 1 TABLET BY MOUTH AT BEDTIME  . ELIQUIS 5 MG TABS tablet Take 1 tablet by mouth twice daily  . fish oil-omega-3 fatty acids 1000 MG capsule Take 1 g by mouth 3 (three) times daily.   . furosemide (LASIX) 20 MG tablet Take 1-2 tablets (20-40 mg total) by mouth daily as needed for fluid.  . metFORMIN (GLUCOPHAGE) 500 MG tablet Take 1 tablet (500 mg total) by mouth 2 (two) times daily with a meal.  . methocarbamol (ROBAXIN) 500 MG tablet TAKE 1 TABLET BY MOUTH AT BEDTIME AS NEEDED FOR RESTLESS LEGS  . Multiple Vitamins-Minerals (PRESERVISION AREDS 2 PO) Take 1 capsule by mouth 2 (two) times daily.   . polyethylene glycol (MIRALAX) 17 g packet Take 17 g by mouth daily as needed (constipation).   . ramipril (ALTACE) 10 MG capsule Take 1 capsule (10 mg total) by mouth daily.    Review of Systems    Review of Systems  Constitution: Negative for  chills, fever and malaise/fatigue.  Cardiovascular: Positive for dyspnea on exertion. Negative for chest pain, irregular heartbeat, leg swelling, near-syncope, orthopnea, palpitations and syncope.  Respiratory: Negative for cough, shortness of breath and wheezing.   Gastrointestinal: Negative for melena, nausea and vomiting.  Genitourinary: Negative for hematuria.  Neurological: Negative for dizziness, light-headedness and weakness.   All other systems reviewed and are otherwise negative except as noted above.  Physical Exam    VS:  BP 130/64 (BP Location: Left Arm, Patient Position: Sitting, Cuff Size: Normal)   Pulse 68   Ht 6\' 4"  (1.93 m)   Wt 239 lb 8 oz (108.6 kg)   SpO2 97%   BMI 29.15 kg/m  , BMI Body mass index is 29.15 kg/m. GEN: Well nourished, well developed, in no acute distress. HEENT: normal. Neck: Supple, no JVD, carotid bruits, or masses. Cardiac: irregularly irregular, no murmurs, rubs, or gallops. No clubbing, cyanosis, edema.  Radials/DP/PT 2+ and equal bilaterally.  Respiratory:  Respirations regular and unlabored, clear to auscultation bilaterally. GI: Soft, nontender, nondistended, BS + x 4. MS: No deformity or atrophy. Skin: Warm and dry, no rash. Neuro:  Strength and sensation are intact. Psych: Normal affect.  Assessment & Plan    1. Chronic atrial fibrillation/chronic anticoagulation - Rate controlled by EKG today. Not requiring rate limiting agents. Chronic anticoagulation with CHADS2VASc of at least 7 (agex2, HTN, HF, Dm2, stroke/tiax2). He has only been taking Eliquis once per day. Denies bleeding complications. Long discussion regarding stroke risk. Offered transition to warfarin which he politely declined. He assures me he will take Eliquis twice daily. Samples provided. Filled out provider portion of patient assistance paperwork and he was given his portion to complete.   2. Coronary artery calcification on CT - EKG today with no acute ST/T wave  changes. He has no anginal symptoms. No indication for ischemic evaluation at this time. No aspirin secondary to chronic anticoagulation. Continue statin.   3. HTN - BP well controlled. Continue present antihypertensive regimen including Ramipril 10mg  daily, Amlodipine 5mg  daily.  4. HLD - 03/13/19 LDL 85. Follows with PCP. Continue Atorvastatin 80mg  daily.   5. Chronic diastolic heart failure - Echo 10/2018 with LVEF 60-65%, indeterminite LV diastolic parameters in setting of atrial fib, mildly elevated RV pressure (41.71mmHg), LA moderately dilated, mild dilation aortic root and  ascending aorta 3.8 cm. Euvolemic and well compensated on exam.   6. Carotid artery disease s/p bilateral carotid endarterectomy -follows with [facility] Dr. Oneida Alar. Duplex 05/2019 with bilateral 1-39% stenosis.   7. Mild dilation aorta - By echo 10/2018 mild dilation aortic root and ascending aorta 3.8cm. Continue optimal BP control, as above. Consider repeat echocardiogram for monitoring at follow up.   Disposition: Follow up in 6 month(s) with Dr. Fletcher Anon or APP.    Loel Dubonnet, NP 11/10/2019, 8:33 AM

## 2019-11-10 ENCOUNTER — Other Ambulatory Visit: Payer: Self-pay

## 2019-11-10 ENCOUNTER — Ambulatory Visit (INDEPENDENT_AMBULATORY_CARE_PROVIDER_SITE_OTHER): Payer: Medicare Other | Admitting: Family

## 2019-11-10 ENCOUNTER — Encounter: Payer: Self-pay | Admitting: Family

## 2019-11-10 VITALS — BP 130/64 | HR 68 | Ht 76.0 in | Wt 239.5 lb

## 2019-11-10 DIAGNOSIS — Z7901 Long term (current) use of anticoagulants: Secondary | ICD-10-CM

## 2019-11-10 DIAGNOSIS — I10 Essential (primary) hypertension: Secondary | ICD-10-CM

## 2019-11-10 DIAGNOSIS — I4821 Permanent atrial fibrillation: Secondary | ICD-10-CM

## 2019-11-10 DIAGNOSIS — E785 Hyperlipidemia, unspecified: Secondary | ICD-10-CM | POA: Diagnosis not present

## 2019-11-10 NOTE — Patient Instructions (Addendum)
Medication Instructions:  Your physician recommends that you continue on your current medications as directed. Please refer to the Current Medication list given to you today.  *If you need a refill on your cardiac medications before your next appointment, please call your pharmacy*   Lab Work: None ordered  If you have labs (blood work) drawn today and your tests are completely normal, you will receive your results only by: Marland Kitchen MyChart Message (if you have MyChart) OR . A paper copy in the mail If you have any lab test that is abnormal or we need to change your treatment, we will call you to review the results.   Testing/Procedures: Your EKG today showed rate controlled atrial fibrillation.    Follow-Up: At [facility], you and your health needs are our priority.  As part of our continuing mission to provide you with exceptional heart care, we have created designated Provider Care Teams.  These Care Teams include your primary Cardiologist (physician) and Advanced Practice Providers (APPs -  Physician Assistants and Nurse Practitioners) who all work together to provide you with the care you need, when you need it.  We recommend signing up for the patient portal called "MyChart".  Sign up information is provided on this After Visit Summary.  MyChart is used to connect with patients for Virtual Visits (Telemedicine).  Patients are able to view lab/test results, encounter notes, upcoming appointments, etc.  Non-urgent messages can be sent to your provider as well.   To learn more about what you can do with MyChart, go to [email or web address].    Your next appointment:  6 months with Dr. Fletcher Anon or app  1- We have provided you with patient assistance paperwork for Eliquis

## 2019-11-13 ENCOUNTER — Other Ambulatory Visit: Payer: Self-pay

## 2019-11-13 ENCOUNTER — Encounter (INDEPENDENT_AMBULATORY_CARE_PROVIDER_SITE_OTHER): Payer: Medicare Other | Admitting: Ophthalmology

## 2019-11-13 DIAGNOSIS — H35033 Hypertensive retinopathy, bilateral: Secondary | ICD-10-CM | POA: Diagnosis not present

## 2019-11-13 DIAGNOSIS — H338 Other retinal detachments: Secondary | ICD-10-CM

## 2019-11-13 DIAGNOSIS — I10 Essential (primary) hypertension: Secondary | ICD-10-CM | POA: Diagnosis not present

## 2019-11-13 DIAGNOSIS — H353132 Nonexudative age-related macular degeneration, bilateral, intermediate dry stage: Secondary | ICD-10-CM | POA: Diagnosis not present

## 2019-11-13 DIAGNOSIS — H43813 Vitreous degeneration, bilateral: Secondary | ICD-10-CM

## 2019-11-15 ENCOUNTER — Other Ambulatory Visit: Payer: Self-pay | Admitting: Family Medicine

## 2019-11-19 ENCOUNTER — Telehealth: Payer: Self-pay | Admitting: Family Medicine

## 2019-11-19 DIAGNOSIS — I779 Disorder of arteries and arterioles, unspecified: Secondary | ICD-10-CM

## 2019-11-19 DIAGNOSIS — I4891 Unspecified atrial fibrillation: Secondary | ICD-10-CM

## 2019-11-19 NOTE — Telephone Encounter (Signed)
Please call pt.  He has h/o mild dilatation of the aortic root and the ascending aorta.  He also has h/o A fib. We can follow with repeat echo, most recently done ~1 year ago.  I put in the f/u order since he is due for repeat echo.  Thanks.

## 2019-11-30 ENCOUNTER — Ambulatory Visit (INDEPENDENT_AMBULATORY_CARE_PROVIDER_SITE_OTHER): Payer: Medicare Other

## 2019-11-30 ENCOUNTER — Other Ambulatory Visit: Payer: Self-pay

## 2019-11-30 DIAGNOSIS — I4891 Unspecified atrial fibrillation: Secondary | ICD-10-CM

## 2019-11-30 DIAGNOSIS — I779 Disorder of arteries and arterioles, unspecified: Secondary | ICD-10-CM

## 2019-12-01 ENCOUNTER — Telehealth: Payer: Self-pay | Admitting: *Deleted

## 2019-12-01 NOTE — Telephone Encounter (Signed)
I spoke with the patient per Jonathan [place], NP's request.  The patient confirms he is currently taking his eliquis BID.  I inquired if he still has the patient assistance paperwork for this as well. The patient states he does not think he received any, but he is uncertain. I advised him to check all the papers that came home with him that day to see if this is in his possession, otherwise he does prefer I mail him another copy of this to complete.  I advised I can do this- mailing address confirmed and placed in outgoing mail.

## 2019-12-01 NOTE — Telephone Encounter (Signed)
-----   Message from Loel Dubonnet, NP sent at 12/01/2019  7:59 AM EDT ----- Hello,  When I saw Jonathan York on 11/10/19 he was only taking Eliquis daily but assured me he would take BID. Can we call him to check if he is taking BID? He also was given Eliquis patient assistance paperwork to bring back. Can we also touch base with him if he's completed that paperwork?  Thanks so much,  Loel Dubonnet, NP

## 2019-12-28 ENCOUNTER — Ambulatory Visit: Payer: Medicare Other | Admitting: Podiatry

## 2020-01-18 ENCOUNTER — Other Ambulatory Visit: Payer: Self-pay | Admitting: Family Medicine

## 2020-02-26 ENCOUNTER — Other Ambulatory Visit: Payer: Self-pay | Admitting: Family Medicine

## 2020-04-02 ENCOUNTER — Other Ambulatory Visit: Payer: Self-pay | Admitting: Family Medicine

## 2020-04-13 NOTE — Progress Notes (Signed)
04/16/2020 12:40 PM   Jonathan York November 17, 1932 [id number]  Referring provider: Tonia Ghent, MD [address] [place],  [place] [address] Chief Complaint  Patient presents with  . Benign Prostatic Hypertrophy    HPI: Jonathan York is a 86 y.o. male who returns for a 6 month follow up of BPH with urinary retention, history of urinary retention and stress incontinence of urine.   He had urinary retention with multiple medical management and recurrent gross hematuria pre-op.   He underwent HoLEP on 08/28/19. Surgical pathology reviewed, 52 g resection, benign tissue. Estimated prostate volume preoperatively 98 g.  During the last visit, her reported not emptying his bladder all the way however PVR was 85 mL. Noted of leakage with no awareness usually when getting up from chair or walking to bathroom. No gross hematuria. He restarted Eliquis. Currently on Flomax.   Today's PVR 0 mL. He has occasional burning at the tip of the penis during urination. He notes nocturia x 1. Occasional frequency but this is not bothersome. He has a good stream. He notes some dribbling/leakage with laughing or sneezing. He is wearing diapers x 1 daily but not saturated, only dribbles.  Overall improving.    He reports staying active and goes to the gym 3 times/week.    IPSS    Row Name 04/16/20 1000         International Prostate Symptom Score   How often have you had the sensation of not emptying your bladder? About half the time     How often have you had to urinate less than every two hours? About half the time     How often have you found you stopped and started again several times when you urinated? Less than half the time     How often have you found it difficult to postpone urination? Not at All     How often have you had a weak urinary stream? About half the time     How many times did you typically get up at night to urinate? 1 Time     Total IPSS Score 12       Quality of  Life due to urinary symptoms   If you were to spend the rest of your life with your urinary condition just the way it is now how would you feel about that? Terrible           Score:  1-7 Mild 8-19 Moderate 20-35 Severe     PMH: Past Medical History:  Diagnosis Date  . Abdominal bruit 05/05/2012  . Arthritis    back pain, past lumbar surgery   . BPH (benign prostatic hypertrophy)   . Carotid arterial disease (HCC)    a. s/p left-sided CEA  . Chronic atrial fibrillation ([place])    a. CHADS2VASc => 5 (HTN, age x 2, DM, vascular disease); Eliquis  . COPD (chronic obstructive pulmonary disease) ([place])   . Diabetes mellitus type II    diet controlled   . Dyspnea   . HLD (hyperlipidemia)   . HTN (hypertension)   . Lung cancer ([place])    a. s/p resection and chemo in 2002  . TIA (transient ischemic attack) 2019    Surgical History: Past Surgical History:  Procedure Laterality Date  . ARCH AORTOGRAM  05/27/2012   Procedure: ARCH AORTOGRAM;  Surgeon: Elam Dutch, MD;  Location: [facility] CATH LAB;  Service: Cardiovascular;;  . BACK SURGERY    .  CARDIOVERSION  06/29/2012   Procedure: CARDIOVERSION;  Surgeon: Thayer Headings, MD;  Location: [place];  Service: Cardiovascular;  Laterality: N/A;  . CAROTID ANGIOGRAM N/A 05/27/2012   Procedure: CAROTID Cyril Loosen;  Surgeon: Elam Dutch, MD;  Location: [facility] CATH LAB;  Service: Cardiovascular;  Laterality: N/A;  . CAROTID ENDARTERECTOMY Left 05/30/12  . CATARACT EXTRACTION     right  . ENDARTERECTOMY  05/30/2012   Procedure: ENDARTERECTOMY CAROTID;  Surgeon: Elam Dutch, MD;  Location: [place];  Service: Vascular;  Laterality: Left;  . ENDARTERECTOMY Right 04/25/2018   Procedure: ENDARTERECTOMY RIGHT CAROTID;  Surgeon: Elam Dutch, MD;  Location: [place];  Service: Vascular;  Laterality: Right;  . EYE SURGERY     R detached retina- 1990's, IOL implants also   . [place]  . HOLEP-LASER ENUCLEATION OF THE  PROSTATE WITH MORCELLATION N/A 08/28/2019   Procedure: HOLEP-LASER ENUCLEATION OF THE PROSTATE WITH MORCELLATION;  Surgeon: Hollice Espy, MD;  Location: [facility] ORS;  Service: Urology;  Laterality: N/A;  . JOINT REPLACEMENT     knee- bilateral- 2009 and 2012  . LOBECTOMY  03/15/01   LUL Roxan Hockey)  . NOSE SURGERY    . PATCH ANGIOPLASTY Right 04/25/2018   Procedure: PATCH ANGIOPLASTY RIGHT CAROTID ARTERY USING HEMASHILED PLATINUM FINESSE PATCH;  Surgeon: Elam Dutch, MD;  Location: [place];  Service: Vascular;  Laterality: Right;  . RETINAL DETACHMENT SURGERY     right  . SPINE SURGERY  approx 2006   Ruptered Disc  . TONSILLECTOMY     1939  . TOTAL KNEE ARTHROPLASTY     x2 per Dr. Rush Farmer  . WEDGE RESECTION  03/15/01   LUL Roxan Hockey)    Home Medications:  Allergies as of 04/16/2020      Reactions   Xarelto [rivaroxaban] Other (See Comments)   vertigo      Medication List       Accurate as of April 16, 2020 11:59 PM. If you have any questions, ask your nurse or doctor.        amLODipine 5 MG tablet Commonly known as: NORVASC Take 1 tablet by mouth once daily   atorvastatin 80 MG tablet Commonly known as: LIPITOR TAKE 1 TABLET BY MOUTH AT BEDTIME   Eliquis 5 MG Tabs tablet Generic drug: apixaban Take 1 tablet by mouth twice daily   fish oil-omega-3 fatty acids 1000 MG capsule Take 1 g by mouth 3 (three) times daily.   furosemide 20 MG tablet Commonly known as: LASIX TAKE 1 TO 2 TABLETS BY MOUTH ONCE DAILY AS NEEDED FOR FLUID   metFORMIN 500 MG tablet Commonly known as: GLUCOPHAGE Take 1 tablet (500 mg total) by mouth 2 (two) times daily with a meal.   methocarbamol 500 MG tablet Commonly known as: ROBAXIN TAKE 1 TABLET BY MOUTH AT BEDTIME AS NEEDED FOR RESTLESS LEGS   MiraLax 17 g packet Generic drug: polyethylene glycol Take 17 g by mouth daily as needed (constipation).   PRESERVISION AREDS 2 PO Take 1 capsule by mouth 2 (two) times daily.     ramipril 10 MG capsule Commonly known as: ALTACE Take 1 capsule (10 mg total) by mouth daily.       Allergies:  Allergies  Allergen Reactions  . Xarelto [Rivaroxaban] Other (See Comments)    vertigo    Family History: Family History  Problem Relation Age of Onset  . Emphysema Father   . Dementia Mother   . Diabetes Mother   .  Hypertension Mother   . Hypothyroidism Sister   . Dementia Brother   . Hypertension Brother   . Diabetes Son   . Heart disease Son        Heart Disease before age 20- Open Heart 2010  . Hypertension Son   . Stroke Son        while on coumadin  . Prostate cancer Neg Hx   . Colon cancer Neg Hx     Social History:  reports that he quit smoking about 37 years ago. His smoking use included cigarettes. He has a 32.00 pack-year smoking history. He has never used smokeless tobacco. He reports that he does not drink alcohol and does not use drugs.   Physical Exam: BP (!) 164/87   Pulse 75   Ht 6\' 4"  (1.93 m)   Wt 233 lb (105.7 kg)   BMI 28.36 kg/m   Constitutional:  Alert and oriented, No acute distress. HEENT: [place] AT, moist mucus membranes.  Trachea midline, no masses. Cardiovascular: No clubbing, cyanosis, or edema. Respiratory: Normal respiratory effort, no increased work of breathing. Skin: No rashes, bruises or suspicious lesions. Neurologic: Grossly intact, no focal deficits, moving all 4 extremities. Psychiatric: Normal mood and affect.  Laboratory Data:  Lab Results  Component Value Date   CREATININE 1.00 10/23/2019    Lab Results  Component Value Date   HGBA1C 7.5 (H) 10/23/2019    Pertinent Imaging: Results for orders placed or performed in visit on 04/16/20  BLADDER SCAN AMB NON-IMAGING  Result Value Ref Range   Scan Result 41ml      Assessment & Plan:    1. BPH with outlet obstruction  S/p HoLEP succesful - voiding without foley IPSS score: 12, moderate PVR is 0 mL.   Adequate bladder emptying with good stream.   Inadvertently marked feeling terrible on IPSS but he was actually very pleased with outcome.   2. Stress incontinence of urine  Continues to wear diapers but feels this is not necessarily needed. I encourage patient to transition to pads.  Recommended pelvic floor exercises with PT however he declined. RTC in 1 year with IPSS/PVR.   Return in about 1 year (around 04/16/2021) for 1year w/IPSS PVR w/PA.  [facility] [address], [place], [place] [address] [phone]  I, Selena Batten, am acting as a scribe for Dr. Hollice Espy.  I have reviewed the above documentation for accuracy and completeness, and I agree with the above.   Hollice Espy, MD

## 2020-04-16 ENCOUNTER — Encounter: Payer: Self-pay | Admitting: Urology

## 2020-04-16 ENCOUNTER — Ambulatory Visit: Payer: Medicare Other | Admitting: Urology

## 2020-04-16 ENCOUNTER — Other Ambulatory Visit: Payer: Self-pay

## 2020-04-16 VITALS — BP 164/87 | HR 75 | Ht 76.0 in | Wt 233.0 lb

## 2020-04-16 DIAGNOSIS — Z87898 Personal history of other specified conditions: Secondary | ICD-10-CM

## 2020-04-16 LAB — BLADDER SCAN AMB NON-IMAGING

## 2020-04-16 NOTE — Patient Instructions (Signed)
Pelvic Floor Muscle Exercises  More commonly called "Kegel" exercises: they can strengthen the muscles that hold urine inside the bladder.  Here's how you do them:   - Imagine that you are trying to control passing gas   - Pull in or tighten your pelvic muscles and hold for a count of 3 (you should feel a lifting sensation in the area around your vagina or pulling in your rectum)   - Repeat 10 to 15 times, at least 3 times a day   - Each time you do these exercises, alternate your position between lying, sitting and standing  Talk to you health care professional to make sure you are doing the exercise the right way.     Lie with hips and knees bent.  On an exhale, squeeze anal sphincter and hold for 3 seconds.  Release and fully relax pelvic floor.  Repeat 3 times.  Perform one set each hour during the day.  Also in this position, lie with hips and knees bent.  Slowly inhale, and then exhale.  On the exhale, contract anal sphincter, pull navel to spine and contract buttocks.  Hold for 5 seconds.  Repeat 5 times.  Perform one set each hour of the day in either lying, seated or standing.  While still laying lie with hips and knees bent. Slowly inhale and then exhale while pulling navel toward spine, holding 5 seconds.  Relax, repeat 5 times.  Perform one set each hour daytime in any position lying seated or standing.  Sitting on an exhale squeeze anal sphincter and hold for 3 seconds.  Release and fully relax pelvic floor.  Repeat 3 times.  Perform one set each hour daytime.  Standing on an exhale squeeze anal sphincter and hold for 3 seconds.  Release and fully relax pelvic floor.  Repeat 3 times.  Perform one set each hour daytime.

## 2020-05-02 ENCOUNTER — Other Ambulatory Visit: Payer: Self-pay | Admitting: Cardiovascular Disease

## 2020-05-02 ENCOUNTER — Other Ambulatory Visit: Payer: Self-pay | Admitting: Family Medicine

## 2020-05-02 NOTE — Telephone Encounter (Signed)
Refill request

## 2020-05-09 ENCOUNTER — Ambulatory Visit (INDEPENDENT_AMBULATORY_CARE_PROVIDER_SITE_OTHER): Payer: Medicare Other

## 2020-05-09 DIAGNOSIS — Z23 Encounter for immunization: Secondary | ICD-10-CM

## 2020-05-16 ENCOUNTER — Encounter: Payer: Self-pay | Admitting: Family Medicine

## 2020-05-16 ENCOUNTER — Other Ambulatory Visit: Payer: Self-pay

## 2020-05-16 ENCOUNTER — Ambulatory Visit (INDEPENDENT_AMBULATORY_CARE_PROVIDER_SITE_OTHER): Payer: Medicare Other | Admitting: Family Medicine

## 2020-05-16 VITALS — BP 122/60 | HR 98 | Temp 95.6°F | Ht 76.0 in | Wt 237.0 lb

## 2020-05-16 DIAGNOSIS — M542 Cervicalgia: Secondary | ICD-10-CM | POA: Diagnosis not present

## 2020-05-16 DIAGNOSIS — E119 Type 2 diabetes mellitus without complications: Secondary | ICD-10-CM | POA: Diagnosis not present

## 2020-05-16 LAB — COMPREHENSIVE METABOLIC PANEL
ALT: 16 U/L (ref 0–53)
AST: 16 U/L (ref 0–37)
Albumin: 4.1 g/dL (ref 3.5–5.2)
Alkaline Phosphatase: 40 U/L (ref 39–117)
BUN: 18 mg/dL (ref 6–23)
CO2: 28 mEq/L (ref 19–32)
Calcium: 9.5 mg/dL (ref 8.4–10.5)
Chloride: 103 mEq/L (ref 96–112)
Creatinine, Ser: 1.04 mg/dL (ref 0.40–1.50)
GFR: 65.73 mL/min (ref >60.00)
Glucose, Bld: 141 mg/dL — ABNORMAL HIGH (ref 70–99)
Potassium: 4.5 mEq/L (ref 3.5–5.1)
Sodium: 138 mEq/L (ref 135–145)
Total Bilirubin: 0.7 mg/dL (ref 0.2–1.2)
Total Protein: 6.3 g/dL (ref 6.0–8.3)

## 2020-05-16 LAB — LIPID PANEL
Cholesterol: 165 mg/dL (ref 0–200)
HDL: 52.6 mg/dL (ref >39.00)
LDL Cholesterol: 92 mg/dL (ref 0–99)
NonHDL: 112.16
Total CHOL/HDL Ratio: 3
Triglycerides: 99 mg/dL (ref 0.0–149.0)
VLDL: 19.8 mg/dL (ref 0.0–40.0)

## 2020-05-16 LAB — CBC WITH DIFFERENTIAL/PLATELET
Basophils Absolute: 0.1 10*3/uL (ref 0.0–0.1)
Basophils Relative: 0.9 % (ref 0.0–3.0)
Eosinophils Absolute: 0.2 10*3/uL (ref 0.0–0.7)
Eosinophils Relative: 2.5 % (ref 0.0–5.0)
HCT: 43.6 % (ref 39.0–52.0)
Hemoglobin: 14.8 g/dL (ref 13.0–17.0)
Lymphocytes Relative: 25 % (ref 12.0–46.0)
Lymphs Abs: 1.9 10*3/uL (ref 0.7–4.0)
MCHC: 33.9 g/dL (ref 30.0–36.0)
MCV: 87.9 fl (ref 78.0–100.0)
Monocytes Absolute: 0.9 10*3/uL (ref 0.1–1.0)
Monocytes Relative: 11.3 % (ref 3.0–12.0)
Neutro Abs: 4.6 10*3/uL (ref 1.4–7.7)
Neutrophils Relative %: 60.3 % (ref 43.0–77.0)
Platelets: 195 10*3/uL (ref 150.0–400.0)
RBC: 4.96 Mil/uL (ref 4.22–5.81)
RDW: 14.2 % (ref 11.5–15.5)
WBC: 7.6 10*3/uL (ref 4.0–10.5)

## 2020-05-16 LAB — HEMOGLOBIN A1C: Hgb A1c MFr Bld: 8.2 % — ABNORMAL HIGH (ref 4.6–6.5)

## 2020-05-16 MED ORDER — METHOCARBAMOL 500 MG PO TABS
ORAL_TABLET | ORAL | 1 refills | Status: DC
Start: 2020-05-16 — End: 2020-11-19

## 2020-05-16 NOTE — Patient Instructions (Addendum)
Go to the lab on the way out.   If you have mychart we'll likely use that to update you.    Gently stretch and use a heating pad.  Use robaxin if needed.  Let me know if that doesn't help.  Check your pillow at home.   Take care.  Glad to see you.

## 2020-05-16 NOTE — Progress Notes (Signed)
This visit occurred during the SARS-CoV-2 public health emergency.  Safety protocols were in place, including screening questions prior to the visit, additional usage of staff PPE, and extensive cleaning of exam room while observing appropriate contact time as indicated for disinfecting solutions.  He had urology follow up recently.  Stream is variable.  Some variable frequency.  I will defer to urology.  He agrees.  Diabetes:  Using medications without difficulties: yes, metformin.   Hypoglycemic episodes: no sx Hyperglycemic episodes: no sx Feet problems: no Blood Sugars averaging: not checked.  eye exam within last year: due, d/w pt.  Labs pending.   Neck pain.  Unclear if bowling (he changed positions) or from sleeping in a chair with neck flexed (watching TV).  Unclear if taking robaxin.  No radicular pain.  Pain going on for about 4 weeks.  Has used bengay with some relief.    Meds, vitals, and allergies reviewed.   ROS: Per HPI unless specifically indicated in ROS section   GEN: nad, alert and oriented HEENT: ncat NECK: supple w/o LA, R trap ttp.   CV: rrr. PULM: ctab, no inc wob EXT: no edema SKIN: no acute rash Normal B grip.   Diabetic foot exam: Normal inspection No skin breakdown No calluses  Normal DP pulses Normal sensation to light touch and monofilament Nails normal except for R 1st nail thickened.

## 2020-05-19 DIAGNOSIS — M542 Cervicalgia: Secondary | ICD-10-CM | POA: Insufficient documentation

## 2020-05-19 NOTE — Assessment & Plan Note (Signed)
Likely muscle strain.  Discussed with patient about options.  Gently stretch and use a heating pad.  Use robaxin if needed.  Let me know if that doesn't help.  Check your pillow at home.  He agrees with plan.  No ominous symptoms.

## 2020-05-19 NOTE — Assessment & Plan Note (Signed)
Continue Metformin as is.  See notes on labs.  He agrees with plan.

## 2020-05-30 IMAGING — CT CT ANGIO HEAD
1 of 12 series · 5 of 33 positions shown · IV contrast (APPLIED)
Comparison: CT HEAD February 05, 2018.  CT chest February 19, 2005.

CLINICAL DATA: TIA symptoms. History of hypertension,
hyperlipidemia, lung cancer, LEFT carotid endarterectomy.

EXAM:
CT ANGIOGRAPHY HEAD AND NECK
TECHNIQUE: Multidetector CT imaging of the head and neck was performed using
the standard protocol during bolus administration of intravenous
contrast. Multiplanar CT image reconstructions and MIPs were
obtained to evaluate the vascular anatomy. Carotid stenosis
measurements (when applicable) are obtained utilizing NASCET
criteria, using the distal internal carotid diameter as the
denominator.
CONTRAST:  50mL SSHBSX-O4R IOPAMIDOL (SSHBSX-O4R) INJECTION 76%

[Series 11: ax thins · axial · 0.39mm/px · z∈[+258,+496]mm · 5 of 358 slices shown]
[im 60/358  soft-tissue]
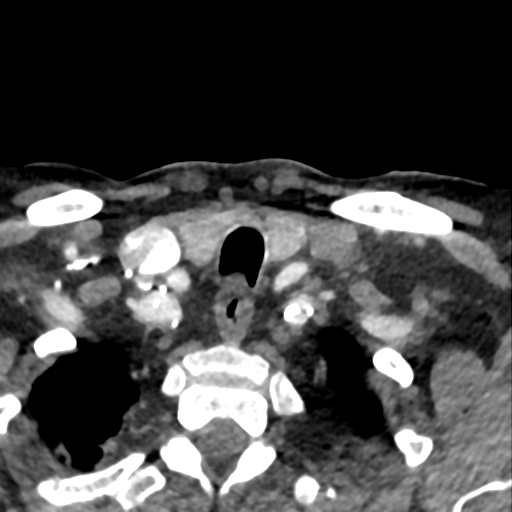
[im 120/358  bone]
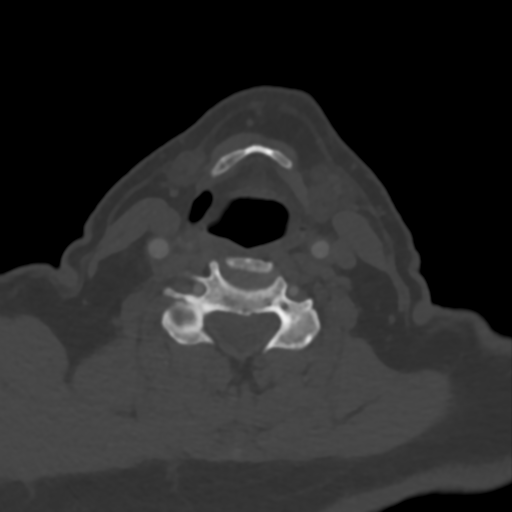
[im 179/358  soft-tissue]
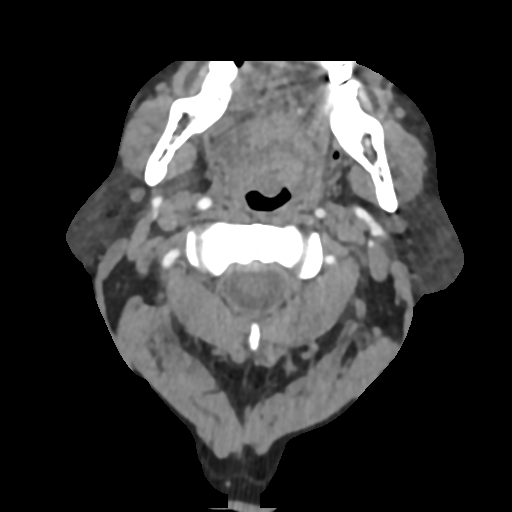
[im 239/358  bone]
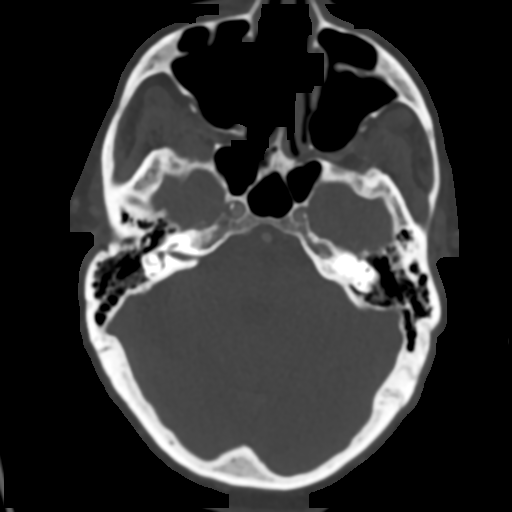
[im 298/358  soft-tissue]
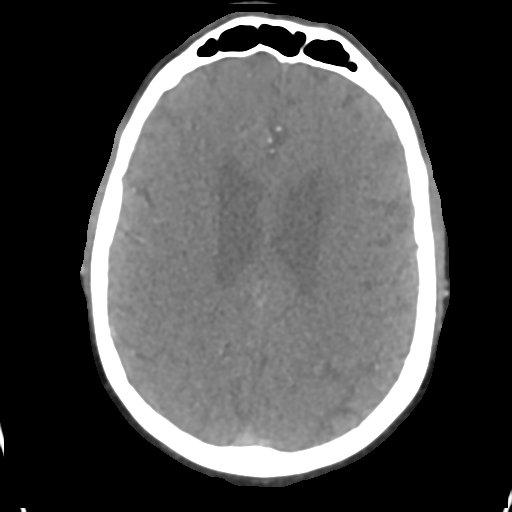

[5 of 33 positions shown; findings below may reference images not displayed]

FINDINGS: CT HEAD FINDINGS

BRAIN: No intraparenchymal hemorrhage, mass effect nor midline
shift. The ventricles and sulci are normal for age. Patchy
supratentorial white matter hypodensities less than expected for
patient's age, though non-specific are most compatible with chronic
small vessel ischemic disease. No acute large vascular territory
infarcts. No abnormal extra-axial fluid collections. Basal cisterns
are patent.

VASCULAR: Moderate calcific atherosclerosis of the carotid siphons.

SKULL: No skull fracture. No significant scalp soft tissue swelling.

SINUSES/ORBITS: Scattered mucosal retention cysts without paranasal
sinus air-fluid levels.The included ocular globes and orbital
contents are non-suspicious. Old RIGHT medial orbital blowout
fracture. Status post bilateral ocular lens implants are RIGHT
scleral banding.

OTHER: None.

CTA NECK FINDINGS:

AORTIC ARCH: Normal appearance of the thoracic arch, normal branch
pattern. Moderate calcific atherosclerosis. The origins of the
innominate, left Common carotid artery and subclavian artery are
patent.

RIGHT CAROTID SYSTEM: Common carotid artery is patent, mild
atherosclerosis. Severe calcific atherosclerosis resulting in 1 cm
segment critical stenosis RIGHT ICA origin, by NASCET criteria. ICA
is patent with mild calcific atherosclerosis.

LEFT CAROTID SYSTEM: Common carotid artery is patent. Patulous LEFT
and carotid artery bulb consistent with endarterectomy, widely
patent carotid bulb and ICA.

VERTEBRAL ARTERIES:Mild stenosis RIGHT vertebral artery origin. Mild
calcific atherosclerosis vertebral arteries which are patent without
flow-limiting stenosis. Streak artifact LEFT the for intradural
segment from dental amalgam.

SKELETON: No acute osseous process though bone windows have not been
submitted.

OTHER NECK: Soft tissues of the neck are nonacute though, not
tailored for evaluation. Severe C5-6 and C6-7 spondylosis.

UPPER CHEST: Spiculated consolidations RIGHT upper lobe.
Centrilobular emphysema.

CTA HEAD FINDINGS:

ANTERIOR CIRCULATION: Patent cervical internal carotid arteries,
petrous, cavernous and supra clinoid internal carotid arteries. Mild
stenosis RIGHT supraclinoid ICA. Patent anterior communicating
artery. Patent anterior and middle cerebral arteries.

No large vessel occlusion, significant stenosis, contrast
extravasation or aneurysm.

POSTERIOR CIRCULATION: Patent vertebral arteries, vertebrobasilar
junction and basilar artery, as well as main branch vessels.
Atherosclerosis resulting in moderate tandem stenosis V4 segments.
Patent posterior cerebral arteries.

No large vessel occlusion, significant stenosis, contrast
extravasation or aneurysm.

VENOUS SINUSES: Major dural venous sinuses are patent though not
tailored for evaluation on this angiographic examination.

ANATOMIC VARIANTS: Hypoplastic LEFT A1 segment.

DELAYED PHASE: No abnormal intracranial enhancement.

MIP images reviewed.
IMPRESSION: CT HEAD:

1. Negative CT HEAD with and without contrast for age.

CTA NECK:

1. Critical stenosis RIGHT ICA. Status post LEFT carotid
endarterectomy, widely patent.
2. Patent vertebral arteries.
3. Multifocal consolidation RIGHT upper lobe. Recommend CT chest
with contrast on non emergent basis.

CTA HEAD:

1. No emergent large vessel occlusion or flow-limiting stenosis.

Aortic Atherosclerosis (LOT13-890.0). Emphysema (LOT13-ORT.H).

## 2020-06-10 ENCOUNTER — Other Ambulatory Visit: Payer: Self-pay | Admitting: Family Medicine

## 2020-06-10 ENCOUNTER — Other Ambulatory Visit: Payer: Self-pay | Admitting: Cardiovascular Disease

## 2020-06-10 NOTE — Telephone Encounter (Signed)
Please schedule overdue F/U appointment. Thank you! ?

## 2020-06-19 NOTE — Progress Notes (Signed)
Office Visit    Patient Name: Jonathan York Date of Encounter: 06/19/2020  Primary Care Provider:  Tonia Ghent, MD Primary Cardiologist:  Kathlyn Sacramento, MD Electrophysiologist:  None   Chief Complaint    Jonathan York is a 86 y.o. male with a hx of chronic atrial fibrillation, HTN, HLD, DM2, carotid disease s/p bilateral carotid endarterectomy, lung cancer s/p resection and chemo in 2002, previous syncopal episode in setting of choking felt to be vasovagal, chronic diastolic heart failure presents today for 39-month follow-up of his atrial fibrillation  Past Medical History    Past Medical History:  Diagnosis Date  . Abdominal bruit 05/05/2012  . Arthritis    back pain, past lumbar surgery   . BPH (benign prostatic hypertrophy)   . Carotid arterial disease (H[facility])    a. s/p left-sided CEA  . Chronic atrial fibrillation ([place])    a. CHADS2VASc => 5 (HTN, age x 2, DM, vascular disease); Eliquis  . COPD (chronic obstructive pulmonary disease) ([place])   . Diabetes mellitus type II    diet controlled   . Dyspnea   . HLD (hyperlipidemia)   . HTN (hypertension)   . Lung cancer ([place])    a. s/p resection and chemo in 2002  . TIA (transient ischemic attack) 2019   Past Surgical History:  Procedure Laterality Date  . ARCH AORTOGRAM  05/27/2012   Procedure: ARCH AORTOGRAM;  Surgeon: Elam Dutch, MD;  Location: [facility] CATH LAB;  Service: Cardiovascular;;  . BACK SURGERY    . CARDIOVERSION  06/29/2012   Procedure: CARDIOVERSION;  Surgeon: Thayer Headings, MD;  Location: [place];  Service: Cardiovascular;  Laterality: N/A;  . CAROTID ANGIOGRAM N/A 05/27/2012   Procedure: CAROTID Cyril Loosen;  Surgeon: Elam Dutch, MD;  Location: [facility] CATH LAB;  Service: Cardiovascular;  Laterality: N/A;  . CAROTID ENDARTERECTOMY Left 05/30/12  . CATARACT EXTRACTION     right  . ENDARTERECTOMY  05/30/2012   Procedure: ENDARTERECTOMY CAROTID;  Surgeon: Elam Dutch, MD;  Location: [place];  Service: Vascular;  Laterality: Left;  . ENDARTERECTOMY Right 04/25/2018   Procedure: ENDARTERECTOMY RIGHT CAROTID;  Surgeon: Elam Dutch, MD;  Location: [place];  Service: Vascular;  Laterality: Right;  . EYE SURGERY     R detached retina- 1990's, IOL implants also   . [place]  . HOLEP-LASER ENUCLEATION OF THE PROSTATE WITH MORCELLATION N/A 08/28/2019   Procedure: HOLEP-LASER ENUCLEATION OF THE PROSTATE WITH MORCELLATION;  Surgeon: Hollice Espy, MD;  Location: [facility];  Service: Urology;  Laterality: N/A;  . JOINT REPLACEMENT     knee- bilateral- 2009 and 2012  . LOBECTOMY  03/15/01   [facility] Hockey)  . NOSE SURGERY    . PATCH ANGIOPLASTY Right 04/25/2018   Procedure: PATCH ANGIOPLASTY RIGHT CAROTID ARTERY USING HEMASHILED PLATINUM FINESSE PATCH;  Surgeon: Elam Dutch, MD;  Location: [place];  Service: Vascular;  Laterality: Right;  . RETINAL DETACHMENT SURGERY     right  . SPINE SURGERY  approx 2006   Ruptered Disc  . TONSILLECTOMY     1939  . TOTAL KNEE ARTHROPLASTY     x2 per Dr. Rush Farmer  . WEDGE RESECTION  03/15/01   [facility] Hockey)    Allergies  Allergies  Allergen Reactions  . Xarelto [Rivaroxaban] Other (See Comments)    vertigo    History of Present Illness    Jonathan York is a 86 y.o. male with a hx  of chronic atrial fibrillation, HTN, HLD, DM2 carotid disease s/p bilateral carotid endarterectomy, lung cancer s/p resection and chemo in 2002, previous syncopal episode in setting of choking felt to be vasovagal, chronic diastolic heart failure CVA/TIA in 2019 (L pontine infarct). He was last seen by 11/10/19.  Noted mild volume overload early 2020 that responded to small dose first night.  Echo April 2020 with LVEF 60-65% with indeterminate diastolic dysfunction and mild pulmonary hypertension.   He lost his wife October 2020. He had holep-laser enucleation of prostate 08/28/19. CT chest 10/08/19 as part of lung cancer screening shows  no significant change in solid part lesion in RUL, aortic atherosclerosis, emphysema, and coronary artery calcifications in LM, LAD, LCx, and RCA.   At previous visits he has taken Eliquis only once daily due to cost. Offered transition to Warfarin, but was agreeable to take twice daily as prescribed.  When we called to check on him he reported taking Eliquis twice daily as prescribed.  He presents today for follow-up.  He is weight is down 3 pounds from clinic visit 6 months ago.  He stays active bowling twice per week as well as walking in his driveway.  He reports no chest pain, pressure, tightness.  Tells me his dyspnea on exertion is stable at his baseline he attributes this to his age as well as COPD.  He has tried inhalers in the past but did not like them.  He has been eating out since his wife passed away last year predominantly at [employer], [place], [place].  He notices occasional swelling on the tops of his feet which is well controlled with furosemide as prescribed by his primary care provider 3 times per week.    EKGs/Labs/Other Studies Reviewed:   The following studies were reviewed today:  Carotid duplex 05/2019 Bilateral Carotid: Patent bilateral carotid endarterectomy with velocity  in the 1-39% stenosis range.   Echo 11/10/18 1. The left ventricle has normal systolic function with an ejection  fraction of 60-65%. The cavity size was normal. There is moderately  increased left ventricular wall thickness. Left ventricular diastolic  Doppler parameters are indeterminate.   2. The right ventricle has normal systolic function. The cavity was  normal. There is no increase in right ventricular wall thickness. Right  ventricular systolic pressure is mildly elevated with an estimated  pressure of 41.6 mmHg.   3. Left atrial size was moderately dilated.   4. The mitral valve is grossly normal. Mild calcification of the mitral  valve leaflet.   5. Moderate  calcification of the aortic valve. Very mild of the aortic  valve.   6. There is dilatation of the aortic root and of the ascending aorta, 3.8  cm   EKG:  EKG is ordered today.  The ekg ordered today demonstrates rate controlled atrial fibrillation 37 bpmand possible old septal infarct, stable compared to previous.   Recent Labs: 05/16/2020: ALT 16; BUN 18; Creatinine, Ser 1.04; Hemoglobin 14.8; Platelets 195.0; Potassium 4.5; Sodium 138  Recent Lipid Panel    Component Value Date/Time   CHOL 165 05/16/2020 0838   TRIG 99.0 05/16/2020 0838   HDL 52.60 05/16/2020 0838   CHOLHDL 3 05/16/2020 0838   VLDL 19.8 05/16/2020 0838   LDLCALC 92 05/16/2020 0838    Home Medications   No outpatient medications have been marked as taking for the 06/21/20 encounter (Appointment) with Loel Dubonnet, NP.    Review of Systems    Review of Systems  Constitutional: Negative for chills, fever and malaise/fatigue.  Cardiovascular: Positive for dyspnea on exertion. Negative for chest pain, irregular heartbeat, leg swelling, near-syncope, orthopnea, palpitations and syncope.  Respiratory: Negative for cough, shortness of breath and wheezing.   Gastrointestinal: Negative for melena, nausea and vomiting.  Genitourinary: Negative for hematuria.  Neurological: Negative for dizziness, light-headedness and weakness.   All other systems reviewed and are otherwise negative except as noted above.  Physical Exam    VS:  There were no vitals taken for this visit. , BMI There is no height or weight on file to calculate BMI. GEN: Well nourished, well developed, in no acute distress. HEENT: normal. Neck: Supple, no JVD, carotid bruits, or masses. Cardiac: irregularly irregular, no murmurs, rubs, or gallops. No clubbing, cyanosis, edema.  Radials/DP/PT 2+ and equal bilaterally.  Respiratory:  Respirations regular and unlabored, clear to auscultation bilaterally. GI: Soft, nontender, nondistended, BS + x  4. MS: No deformity or atrophy. Skin: Warm and dry, no rash. Neuro:  Strength and sensation are intact. Psych: Normal affect.  Assessment & Plan    1. Chronic atrial fibrillation/chronic anticoagulation - Rate controlled by EKG today. Not requiring rate limiting agents. Chronic anticoagulation with CHADS2VASc of at least 7 (agex2, HTN, HF, Dm2, stroke/tiax2).  Continue Eliquis 5 mg twice per day.  Does not meet dose reduction criteria.  Denies bleeding complications.  CBC 05/16/2020 with hemoglobin 14.8.    2. Coronary artery calcification on CT - No anginal symptoms.  No indication for ischemic evaluation at this time.  Continue statin.  No aspirin secondary to chronic anticoagulation.  3. HTN - BP well controlled. Continue current antihypertensive regimen.  Refill of ramipril provided.  4. HLD - Lipid panel 05/16/20 with LDL 112.  Recommend lipid-lowering diet and continuing atorvastatin 80 mg daily.  He understandably prefers to defer medication changes at this time.  Future considerations could include addition of Zetia.  5. Chronic diastolic heart failure - Echo 10/2018 with LVEF 60-65%, indeterminite LV diastolic parameters in setting of atrial fib, mildly elevated RV pressure (41.60mmHg), LA moderately dilated, mild dilation aortic root and ascending aorta 3.8 cm.  Euvolemic and well compensated on exam.  Lower extremity edema is well controlled with Lasix 3 times per week.  Continue ACE inhibitor.  6. Carotid artery disease s/p bilateral carotid endarterectomy -follows with [facility] Dr. Oneida Alar. Duplex 05/2019 with bilateral 1-39% stenosis.  Continue high intensity statin.  7. Mild dilation aorta - By echo 10/2018 mild dilation aortic root and ascending aorta 3.8cm. Continue optimal BP control, as above.  Could consider repeat echocardiogram at follow-up.  As his blood pressure has been well controlled, will defer at this time.  Disposition: Follow up in 9 month(s) with Dr. Fletcher Anon or APP.     Loel Dubonnet, NP 06/19/2020, 4:55 PM

## 2020-06-21 ENCOUNTER — Ambulatory Visit: Payer: Medicare Other | Admitting: Family

## 2020-06-21 ENCOUNTER — Encounter: Payer: Self-pay | Admitting: Family

## 2020-06-21 ENCOUNTER — Other Ambulatory Visit: Payer: Self-pay

## 2020-06-21 VITALS — BP 136/80 | HR 78 | Ht 76.0 in | Wt 236.0 lb

## 2020-06-21 DIAGNOSIS — I10 Essential (primary) hypertension: Secondary | ICD-10-CM | POA: Diagnosis not present

## 2020-06-21 DIAGNOSIS — I6523 Occlusion and stenosis of bilateral carotid arteries: Secondary | ICD-10-CM

## 2020-06-21 DIAGNOSIS — I5032 Chronic diastolic (congestive) heart failure: Secondary | ICD-10-CM | POA: Diagnosis not present

## 2020-06-21 DIAGNOSIS — I2510 Atherosclerotic heart disease of native coronary artery without angina pectoris: Secondary | ICD-10-CM

## 2020-06-21 DIAGNOSIS — Z7901 Long term (current) use of anticoagulants: Secondary | ICD-10-CM

## 2020-06-21 DIAGNOSIS — E782 Mixed hyperlipidemia: Secondary | ICD-10-CM

## 2020-06-21 DIAGNOSIS — I77810 Thoracic aortic ectasia: Secondary | ICD-10-CM

## 2020-06-21 DIAGNOSIS — I4821 Permanent atrial fibrillation: Secondary | ICD-10-CM | POA: Diagnosis not present

## 2020-06-21 MED ORDER — RAMIPRIL 10 MG PO CAPS: 10.0000 mg | ORAL_CAPSULE | Freq: Every day | ORAL | 1 refills | Status: DC

## 2020-06-21 NOTE — Patient Instructions (Addendum)
Medication Instructions:  No medication changes today.   *If you need a refill on your cardiac medications before your next appointment, please call your pharmacy*  Lab Work: None ordered today.  Testing/Procedures: Your EKG today was stable compared to previous.   Follow-Up: At [facility], you and your health needs are our priority.  As part of our continuing mission to provide you with exceptional heart care, we have created designated Provider Care Teams.  These Care Teams include your primary Cardiologist (physician) and Advanced Practice Providers (APPs -  Physician Assistants and Nurse Practitioners) who all work together to provide you with the care you need, when you need it.  We recommend signing up for the patient portal called "MyChart".  Sign up information is provided on this After Visit Summary.  MyChart is used to connect with patients for Virtual Visits (Telemedicine).  Patients are able to view lab/test results, encounter notes, upcoming appointments, etc.  Non-urgent messages can be sent to your provider as well.   To learn more about what you can do with MyChart, go to [email or web address].    Your next appointment:   9 month(s)  The format for your next appointment:   In Person  Provider:   You may see Kathlyn Sacramento, MD or one of the following Advanced Practice Providers on your designated Care Team:    Murray Hodgkins, NP  Christell Faith, PA-C  Marrianne Mood, PA-C  Cadence [place], [place]  Laurann Montana, NP  Other Instructions Keep up the good work staying active! Best of luck in your bowling game!  Continue to use fluid pill as needed for feet swelling. It also helps to keep your feet elevated. Eating a low salt diet also helps to prevent swelling.

## 2020-08-13 ENCOUNTER — Other Ambulatory Visit: Payer: Self-pay | Admitting: Family Medicine

## 2020-08-14 NOTE — Telephone Encounter (Signed)
Pharmacy requests refill on: Amlodipine 5 mg   LAST REFILL: 05/03/2020 (Q-90, R-0) LAST OV: 05/16/2020 NEXT OV: 11/19/2020 PHARMACY: [place], [place]

## 2020-09-08 ENCOUNTER — Encounter: Payer: Self-pay | Admitting: Family Medicine

## 2020-10-18 DIAGNOSIS — H5231 Anisometropia: Secondary | ICD-10-CM | POA: Diagnosis not present

## 2020-10-18 DIAGNOSIS — Z9841 Cataract extraction status, right eye: Secondary | ICD-10-CM | POA: Diagnosis not present

## 2020-10-18 DIAGNOSIS — Z9842 Cataract extraction status, left eye: Secondary | ICD-10-CM | POA: Diagnosis not present

## 2020-11-12 ENCOUNTER — Other Ambulatory Visit: Payer: Self-pay

## 2020-11-12 ENCOUNTER — Encounter (INDEPENDENT_AMBULATORY_CARE_PROVIDER_SITE_OTHER): Payer: Medicare Other | Admitting: Ophthalmology

## 2020-11-12 DIAGNOSIS — H43813 Vitreous degeneration, bilateral: Secondary | ICD-10-CM | POA: Diagnosis not present

## 2020-11-12 DIAGNOSIS — H35341 Macular cyst, hole, or pseudohole, right eye: Secondary | ICD-10-CM | POA: Diagnosis not present

## 2020-11-12 DIAGNOSIS — H338 Other retinal detachments: Secondary | ICD-10-CM | POA: Diagnosis not present

## 2020-11-12 DIAGNOSIS — H353132 Nonexudative age-related macular degeneration, bilateral, intermediate dry stage: Secondary | ICD-10-CM | POA: Diagnosis not present

## 2020-11-12 DIAGNOSIS — H35033 Hypertensive retinopathy, bilateral: Secondary | ICD-10-CM

## 2020-11-12 DIAGNOSIS — I10 Essential (primary) hypertension: Secondary | ICD-10-CM | POA: Diagnosis not present

## 2020-11-12 LAB — HM DIABETES EYE EXAM

## 2020-11-19 ENCOUNTER — Other Ambulatory Visit: Payer: Self-pay | Admitting: Family Medicine

## 2020-11-19 ENCOUNTER — Encounter: Payer: Self-pay | Admitting: Family Medicine

## 2020-11-19 ENCOUNTER — Ambulatory Visit (INDEPENDENT_AMBULATORY_CARE_PROVIDER_SITE_OTHER): Payer: Medicare Other | Admitting: Family Medicine

## 2020-11-19 ENCOUNTER — Ambulatory Visit: Payer: Medicare Other | Admitting: Family Medicine

## 2020-11-19 ENCOUNTER — Other Ambulatory Visit: Payer: Medicare Other

## 2020-11-19 ENCOUNTER — Other Ambulatory Visit: Payer: Self-pay

## 2020-11-19 VITALS — BP 142/80 | HR 72 | Temp 97.3°F | Ht 76.0 in | Wt 238.0 lb

## 2020-11-19 DIAGNOSIS — R413 Other amnesia: Secondary | ICD-10-CM | POA: Diagnosis not present

## 2020-11-19 DIAGNOSIS — E119 Type 2 diabetes mellitus without complications: Secondary | ICD-10-CM | POA: Diagnosis not present

## 2020-11-19 DIAGNOSIS — I779 Disorder of arteries and arterioles, unspecified: Secondary | ICD-10-CM | POA: Diagnosis not present

## 2020-11-19 LAB — COMPREHENSIVE METABOLIC PANEL
ALT: 33 U/L (ref 0–53)
AST: 21 U/L (ref 0–37)
Albumin: 4.3 g/dL (ref 3.5–5.2)
Alkaline Phosphatase: 62 U/L (ref 39–117)
BUN: 22 mg/dL (ref 6–23)
CO2: 30 mEq/L (ref 19–32)
Calcium: 9.9 mg/dL (ref 8.4–10.5)
Chloride: 101 mEq/L (ref 96–112)
Creatinine, Ser: 1.41 mg/dL (ref 0.40–1.50)
GFR: 45.45 mL/min — ABNORMAL LOW (ref >60.00)
Glucose, Bld: 211 mg/dL — ABNORMAL HIGH (ref 70–99)
Potassium: 5.2 mEq/L — ABNORMAL HIGH (ref 3.5–5.1)
Sodium: 139 mEq/L (ref 135–145)
Total Bilirubin: 1 mg/dL (ref 0.2–1.2)
Total Protein: 6.7 g/dL (ref 6.0–8.3)

## 2020-11-19 LAB — HEMOGLOBIN A1C: Hgb A1c MFr Bld: 10 % — ABNORMAL HIGH (ref 4.6–6.5)

## 2020-11-19 LAB — CBC WITH DIFFERENTIAL/PLATELET
Basophils Absolute: 0 10*3/uL (ref 0.0–0.1)
Basophils Relative: 0.4 % (ref 0.0–3.0)
Eosinophils Absolute: 0.1 10*3/uL (ref 0.0–0.7)
Eosinophils Relative: 1.2 % (ref 0.0–5.0)
HCT: 45.1 % (ref 39.0–52.0)
Hemoglobin: 15.3 g/dL (ref 13.0–17.0)
Lymphocytes Relative: 28.6 % (ref 12.0–46.0)
Lymphs Abs: 2.5 10*3/uL (ref 0.7–4.0)
MCHC: 34 g/dL (ref 30.0–36.0)
MCV: 87.1 fl (ref 78.0–100.0)
Monocytes Absolute: 1 10*3/uL (ref 0.1–1.0)
Monocytes Relative: 11.3 % (ref 3.0–12.0)
Neutro Abs: 5 10*3/uL (ref 1.4–7.7)
Neutrophils Relative %: 58.5 % (ref 43.0–77.0)
Platelets: 200 10*3/uL (ref 150.0–400.0)
RBC: 5.17 Mil/uL (ref 4.22–5.81)
RDW: 14.2 % (ref 11.5–15.5)
WBC: 8.6 10*3/uL (ref 4.0–10.5)

## 2020-11-19 LAB — VITAMIN B12: Vitamin B-12: 202 pg/mL — ABNORMAL LOW (ref 211–911)

## 2020-11-19 LAB — TSH: TSH: 1.65 u[IU]/mL (ref 0.35–4.50)

## 2020-11-19 NOTE — Patient Instructions (Signed)
Go to the lab on the way out.   If you have mychart we'll likely use that to update you.    We'll call about the echo.  Take care.  Glad to see you.

## 2020-11-19 NOTE — Progress Notes (Signed)
This visit occurred during the SARS-CoV-2 public health emergency.  Safety protocols were in place, including screening questions prior to the visit, additional usage of staff PPE, and extensive cleaning of exam room while observing appropriate contact time as indicated for disinfecting solutions.  Diabetes:  Using medications without difficulties: yes Hypoglycemic episodes: no Hyperglycemic episodes: no Feet problems: no numbness but some swelling occ. More at the end of the day, less early AM.   Blood Sugars averaging: no checking eye exam within last year: up to date, done last week.   Labs pending.    Memory change d/w pt.  Family noted some changes.  Here today with son.  Patient noted some changes.  He noted short term changes. No red flag events.  He isn't driving as much as before. 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    There is mild dilatation of the ascending aorta measuring 40 mm seen on echo 11/2019.  D/w pt about follow up.  Follow-up echo ordered.  PMH and SH reviewed  Meds, vitals, and allergies reviewed.   ROS: Per HPI unless specifically indicated in ROS section   GEN: nad, alert and oriented HEENT: ncat NECK: supple w/o LA CV: rrr. PULM: ctab, no inc wob ABD: soft, +bs EXT: trace BLE edema SKIN: well perfused.

## 2020-11-20 DIAGNOSIS — R413 Other amnesia: Secondary | ICD-10-CM | POA: Insufficient documentation

## 2020-11-20 DIAGNOSIS — I779 Disorder of arteries and arterioles, unspecified: Secondary | ICD-10-CM | POA: Insufficient documentation

## 2020-11-20 NOTE — Assessment & Plan Note (Signed)
Unclear source.  MMSE done.  Scored 26 out of 30.  He missed 2 points for switching letters when spelling world backward.  He missed 1 for pentagon drawing and 1 for recall.  We need to look for reversible causes.  See notes on lab results.  Rationale for evaluation discussed with patient.  He agrees with.  Son agrees.  Still okay for outpatient follow-up.  He is not having red flag events.

## 2020-11-20 NOTE — Assessment & Plan Note (Signed)
Recheck A1c pending.  Continue metformin twice daily for now.  35 minutes were devoted to patient care in this encounter (this includes time spent reviewing the patient's file/history, interviewing and examining the patient, counseling/reviewing plan with patient).

## 2020-11-20 NOTE — Assessment & Plan Note (Signed)
History of mild dilation of the ascending aorta.  Recheck echo pending.

## 2020-11-28 ENCOUNTER — Encounter: Payer: Self-pay | Admitting: Family Medicine

## 2020-11-28 ENCOUNTER — Other Ambulatory Visit: Payer: Self-pay

## 2020-11-28 ENCOUNTER — Ambulatory Visit (INDEPENDENT_AMBULATORY_CARE_PROVIDER_SITE_OTHER): Payer: Medicare Other | Admitting: Family Medicine

## 2020-11-28 VITALS — BP 140/82 | HR 75 | Temp 97.5°F | Wt 240.3 lb

## 2020-11-28 DIAGNOSIS — I10 Essential (primary) hypertension: Secondary | ICD-10-CM | POA: Diagnosis not present

## 2020-11-28 DIAGNOSIS — E119 Type 2 diabetes mellitus without complications: Secondary | ICD-10-CM | POA: Diagnosis not present

## 2020-11-28 DIAGNOSIS — E538 Deficiency of other specified B group vitamins: Secondary | ICD-10-CM | POA: Diagnosis not present

## 2020-11-28 DIAGNOSIS — I4821 Permanent atrial fibrillation: Secondary | ICD-10-CM

## 2020-11-28 DIAGNOSIS — H6120 Impacted cerumen, unspecified ear: Secondary | ICD-10-CM | POA: Diagnosis not present

## 2020-11-28 MED ORDER — RAMIPRIL 10 MG PO CAPS: 10.0000 mg | ORAL_CAPSULE | Freq: Every day | ORAL | Status: DC

## 2020-11-28 MED ORDER — AMLODIPINE BESYLATE 5 MG PO TABS: 1.0000 | ORAL_TABLET | Freq: Every day | ORAL | Status: DC

## 2020-11-28 MED ORDER — CYANOCOBALAMIN 1000 MCG/ML IJ SOLN
1000.0000 ug | Freq: Once | INTRAMUSCULAR | Status: AC
  Administered 2020-11-28: 1000 ug via INTRAMUSCULAR

## 2020-11-28 MED ORDER — CYANOCOBALAMIN 1000 MCG/ML IJ SOLN: INTRAMUSCULAR | Status: DC

## 2020-11-28 NOTE — Progress Notes (Signed)
This visit occurred during the SARS-CoV-2 public health emergency.  Safety protocols were in place, including screening questions prior to the visit, additional usage of staff PPE, and extensive cleaning of exam room while observing appropriate contact time as indicated for disinfecting solutions.  Echo pending.  D/w pt.    Memory changes noted.  Low B12 level noted.  He is off all meds, I found out about this today.  Unclear how long he has been off meds.  Family is going to monitor meds in the future.  Discussed rationale for B12 replacement.  He wants to get that done here at the clinic.  DM2.  See about re: meds.  He had been eating more ice cream.  Off treatment otherwise currently.  Hard of hearing, he wanted right ear checked.  See below.  Meds, vitals, and allergies reviewed.   ROS: Per HPI unless specifically indicated in ROS section   GEN: nad, alert and oriented HEENT: NCAT, right ear with cerumen impaction removed with curette without complication.  Tolerated well and hearing improved.  Recheck right tympanic membrane normal. NECK: supple w/o LA, UAN noise but no wheeze.   CV: Recheck pulse 75 with occ ectopy noted.   PULM: ctab, no inc wob ABD: soft, +bs EXT: no edema SKIN: no acute rash  31 minutes were devoted to patient care in this encounter (this includes time spent reviewing the patient's file/history, interviewing and examining the patient, counseling/reviewing plan with patient).

## 2020-11-28 NOTE — Patient Instructions (Addendum)
Check meds, number of pills and refill dates at home.  Let me know about that.  We'll go from there.   B12 shot today and again in 2 weeks.   Then monthly thereafter.  Plan on recheck labs in about 3 months at a visit.

## 2020-11-29 ENCOUNTER — Other Ambulatory Visit: Payer: Self-pay | Admitting: Family Medicine

## 2020-11-29 DIAGNOSIS — I10 Essential (primary) hypertension: Secondary | ICD-10-CM

## 2020-11-29 MED ORDER — OMEGA-3 FATTY ACIDS 1000 MG PO CAPS: ORAL_CAPSULE | ORAL | Status: DC

## 2020-11-29 MED ORDER — ATORVASTATIN CALCIUM 80 MG PO TABS: ORAL_TABLET | ORAL | Status: DC

## 2020-11-29 MED ORDER — AMLODIPINE BESYLATE 5 MG PO TABS: ORAL_TABLET | ORAL | Status: DC

## 2020-11-29 MED ORDER — POLYETHYLENE GLYCOL 3350 17 G PO PACK: PACK | ORAL | Status: DC

## 2020-11-29 MED ORDER — ELIQUIS 5 MG PO TABS: 1.0000 | ORAL_TABLET | Freq: Two times a day (BID) | ORAL | 1 refills | Status: DC

## 2020-11-29 MED ORDER — RAMIPRIL 10 MG PO CAPS: ORAL_CAPSULE | ORAL | Status: DC

## 2020-12-01 DIAGNOSIS — E538 Deficiency of other specified B group vitamins: Secondary | ICD-10-CM | POA: Insufficient documentation

## 2020-12-01 NOTE — Assessment & Plan Note (Signed)
Would restart anticoagulation with family monitoring medication adherence.

## 2020-12-01 NOTE — Assessment & Plan Note (Signed)
Resolved and he felt better.

## 2020-12-01 NOTE — Assessment & Plan Note (Signed)
1000 mcg B12 dose given IM at office visit.  Repeat in 2 weeks then monthly thereafter.  We can recheck labs at a visit in about 3 months.  Rationale for replacement discussed with patient.

## 2020-12-01 NOTE — Assessment & Plan Note (Signed)
See follow-up notes.  Would restart metformin.  We can recheck periodically.  Family is going to check on patient regarding medication adherence and he will stop eating ice cream.

## 2020-12-09 ENCOUNTER — Telehealth: Payer: Self-pay

## 2020-12-09 NOTE — Telephone Encounter (Signed)
Called pt and set him up for 5/26 @ 3 for his b-12 inj

## 2020-12-09 NOTE — Telephone Encounter (Signed)
Please call patient and schedule NV for B12 injection

## 2020-12-09 NOTE — Telephone Encounter (Signed)
-----   Message from Tonia Ghent, MD sent at 12/08/2020  9:56 PM EDT ----- Please check with patient about scheduling his B12 shot for this week.  Thanks.

## 2020-12-12 ENCOUNTER — Other Ambulatory Visit: Payer: Self-pay

## 2020-12-12 ENCOUNTER — Ambulatory Visit (INDEPENDENT_AMBULATORY_CARE_PROVIDER_SITE_OTHER): Payer: Medicare Other

## 2020-12-12 DIAGNOSIS — E538 Deficiency of other specified B group vitamins: Secondary | ICD-10-CM

## 2020-12-12 MED ORDER — CYANOCOBALAMIN 1000 MCG/ML IJ SOLN
1000.0000 ug | Freq: Once | INTRAMUSCULAR | Status: AC
  Administered 2020-12-12: 1000 ug via INTRAMUSCULAR

## 2020-12-12 NOTE — Progress Notes (Signed)
Per orders of Dr. Duncan, injection of vit B12 given by Jashawn Floyd. Patient tolerated injection well.  

## 2020-12-16 ENCOUNTER — Other Ambulatory Visit: Payer: Self-pay | Admitting: Family Medicine

## 2020-12-16 MED ORDER — CYANOCOBALAMIN 1000 MCG/ML IJ SOLN: INTRAMUSCULAR | Status: DC

## 2020-12-24 ENCOUNTER — Other Ambulatory Visit: Payer: Self-pay

## 2020-12-24 ENCOUNTER — Ambulatory Visit (INDEPENDENT_AMBULATORY_CARE_PROVIDER_SITE_OTHER): Payer: Medicare Other

## 2020-12-24 DIAGNOSIS — I779 Disorder of arteries and arterioles, unspecified: Secondary | ICD-10-CM | POA: Diagnosis not present

## 2020-12-24 DIAGNOSIS — I4891 Unspecified atrial fibrillation: Secondary | ICD-10-CM | POA: Diagnosis not present

## 2020-12-24 MED ORDER — PERFLUTREN LIPID MICROSPHERE
1.0000 mL | INTRAVENOUS | Status: AC | PRN
  Administered 2020-12-24: 2 mL via INTRAVENOUS

## 2020-12-25 LAB — ECHOCARDIOGRAM COMPLETE
AR max vel: 1.64 cm2
AV Area VTI: 1.81 cm2
AV Area mean vel: 1.63 cm2
AV Mean grad: 6 mmHg
AV Peak grad: 10.9 mmHg
Ao pk vel: 1.65 m/s
S' Lateral: 2.7 cm

## 2021-01-22 ENCOUNTER — Encounter: Payer: Self-pay | Admitting: Family Medicine

## 2021-01-26 ENCOUNTER — Other Ambulatory Visit: Payer: Self-pay | Admitting: Family Medicine

## 2021-01-26 MED ORDER — ATORVASTATIN CALCIUM 80 MG PO TABS: 80.0000 mg | ORAL_TABLET | Freq: Every day | ORAL | 3 refills | Status: DC

## 2021-01-26 MED ORDER — APIXABAN 5 MG PO TABS: 5.0000 mg | ORAL_TABLET | Freq: Two times a day (BID) | ORAL | 3 refills | Status: DC

## 2021-01-26 MED ORDER — METFORMIN HCL 500 MG PO TABS: 500.0000 mg | ORAL_TABLET | Freq: Two times a day (BID) | ORAL | 3 refills | Status: DC

## 2021-01-30 ENCOUNTER — Other Ambulatory Visit: Payer: Self-pay

## 2021-01-30 ENCOUNTER — Ambulatory Visit (INDEPENDENT_AMBULATORY_CARE_PROVIDER_SITE_OTHER): Payer: Medicare Other

## 2021-01-30 DIAGNOSIS — E538 Deficiency of other specified B group vitamins: Secondary | ICD-10-CM

## 2021-01-30 MED ORDER — CYANOCOBALAMIN 1000 MCG/ML IJ SOLN
1000.0000 ug | Freq: Once | INTRAMUSCULAR | Status: AC
  Administered 2021-01-30: 1000 ug via INTRAMUSCULAR

## 2021-01-30 NOTE — Progress Notes (Signed)
Per orders of Dr. Elsie Stain, injection of B 12 given by Francella Solian in right deltoid. Patient tolerated injection well.

## 2021-02-17 ENCOUNTER — Encounter: Payer: Self-pay | Admitting: Family Medicine

## 2021-02-17 ENCOUNTER — Ambulatory Visit (INDEPENDENT_AMBULATORY_CARE_PROVIDER_SITE_OTHER): Payer: Medicare Other | Admitting: Family Medicine

## 2021-02-17 ENCOUNTER — Other Ambulatory Visit: Payer: Self-pay

## 2021-02-17 VITALS — BP 138/80 | HR 97 | Temp 97.8°F | Wt 233.0 lb

## 2021-02-17 DIAGNOSIS — H6120 Impacted cerumen, unspecified ear: Secondary | ICD-10-CM | POA: Diagnosis not present

## 2021-02-17 DIAGNOSIS — R413 Other amnesia: Secondary | ICD-10-CM

## 2021-02-17 DIAGNOSIS — E119 Type 2 diabetes mellitus without complications: Secondary | ICD-10-CM | POA: Diagnosis not present

## 2021-02-17 DIAGNOSIS — M79676 Pain in unspecified toe(s): Secondary | ICD-10-CM | POA: Diagnosis not present

## 2021-02-17 DIAGNOSIS — I10 Essential (primary) hypertension: Secondary | ICD-10-CM

## 2021-02-17 DIAGNOSIS — E538 Deficiency of other specified B group vitamins: Secondary | ICD-10-CM | POA: Diagnosis not present

## 2021-02-17 NOTE — Patient Instructions (Signed)
Try changing the lace pattern to open up the toe box and update me as needed.  Use can use debrox prior to a shower if needed.   Go to the lab on the way out.   If you have mychart we'll likely use that to update you.    Take care.  Glad to see you.

## 2021-02-17 NOTE — Progress Notes (Signed)
This visit occurred during the SARS-CoV-2 public health emergency.  Safety protocols were in place, including screening questions prior to the visit, additional usage of staff PPE, and extensive cleaning of exam room while observing appropriate contact time as indicated for disinfecting solutions.  R foot pain. Started about 1-2 weeks ago.  Had complained to his son.  More pain in a shoe.  Going barefoot helped.  No known trauma.  Diabetes:  Using medications without difficulties: yes, metformin.  Hypoglycemic episodes: no sx Hyperglycemic episodes: no sx Feet problems: see above Blood Sugars averaging: not checked.  A1c pending.    Off amlodipine and ramipril in the meantime.  BP still controlled.  No recent lasix use or needed.  Med list updated.   B12 shot, most recently ~2 weeks ago.  Due for recheck level.   See notes on labs.  Son thought his memory may have improved slightly with initial B12 replacement but then leveled off again in the meantime with deficits for short-term memory.  Decreased hearing recently.  Volume on TV has been up.  See exam.  Meds, vitals, and allergies reviewed.   ROS: Per HPI unless specifically indicated in ROS section   GEN: nad, alert and pleasant in conversation.   HEENT: ncat, bilateral cerumen impaction removed with curette after patient consents.  Recheck canal and tympanic membranes normal and he felt better, improved hearing. NECK: supple w/o LA CV: rrr. PULM: ctab, no inc wob ABD: soft, +bs EXT: no edema SKIN: no acute rash  Diabetic foot exam: R foot- mild irritation at the distal R 4th toenail w/o ulceration.   Normal inspection No skin breakdown No calluses  Normal DP pulses Normal sensation to light touch 1st nail thickened.    41 minutes were devoted to patient care in this encounter (this includes time spent reviewing the patient's file/history, interviewing and examining the patient, counseling/reviewing plan with patient).

## 2021-02-18 ENCOUNTER — Ambulatory Visit: Payer: Medicare Other | Admitting: Family Medicine

## 2021-02-18 LAB — BASIC METABOLIC PANEL
BUN: 15 mg/dL (ref 6–23)
CO2: 23 mEq/L (ref 19–32)
Calcium: 9.2 mg/dL (ref 8.4–10.5)
Chloride: 104 mEq/L (ref 96–112)
Creatinine, Ser: 1.09 mg/dL (ref 0.40–1.50)
GFR: 61.79 mL/min (ref >60.00)
Glucose, Bld: 186 mg/dL — ABNORMAL HIGH (ref 70–99)
Potassium: 3.9 mEq/L (ref 3.5–5.1)
Sodium: 139 mEq/L (ref 135–145)

## 2021-02-18 LAB — VITAMIN B12: Vitamin B-12: 285 pg/mL (ref 211–911)

## 2021-02-18 LAB — HEMOGLOBIN A1C: Hgb A1c MFr Bld: 8.5 % — ABNORMAL HIGH (ref 4.6–6.5)

## 2021-02-19 DIAGNOSIS — M79676 Pain in unspecified toe(s): Secondary | ICD-10-CM | POA: Insufficient documentation

## 2021-02-19 NOTE — Assessment & Plan Note (Signed)
Continue metformin.  See notes on A1c.

## 2021-02-19 NOTE — Assessment & Plan Note (Signed)
Resolved with curette bilaterally.  See above.

## 2021-02-19 NOTE — Assessment & Plan Note (Signed)
History of, off amlodipine and ramipril in the meantime and blood pressures controlled.  Would continue as is.

## 2021-02-19 NOTE — Assessment & Plan Note (Signed)
See B12 discussion.

## 2021-02-19 NOTE — Assessment & Plan Note (Signed)
Likely with distal irritation potentially compounded by a tight toe box an issue.  Discussed not using the distal lace hole to allow for more room in the toebox.  This should heal.  No ulceration.  No sign of infection.

## 2021-02-19 NOTE — Assessment & Plan Note (Signed)
Would continue replacement.  See notes on follow-up labs. Consider memory testing if B12 wnl.

## 2021-03-04 ENCOUNTER — Encounter: Payer: Self-pay | Admitting: *Deleted

## 2021-03-20 ENCOUNTER — Ambulatory Visit (INDEPENDENT_AMBULATORY_CARE_PROVIDER_SITE_OTHER): Payer: Medicare Other

## 2021-03-20 ENCOUNTER — Other Ambulatory Visit: Payer: Self-pay

## 2021-03-20 DIAGNOSIS — E538 Deficiency of other specified B group vitamins: Secondary | ICD-10-CM

## 2021-03-20 MED ORDER — CYANOCOBALAMIN 1000 MCG/ML IJ SOLN
1000.0000 ug | Freq: Once | INTRAMUSCULAR | Status: AC
  Administered 2021-03-20: 1000 ug via INTRAMUSCULAR

## 2021-03-20 NOTE — Progress Notes (Signed)
Per orders of Dr. Duncan, injection of vit B12 given by Arlanda Shiplett. Patient tolerated injection well.  

## 2021-03-27 ENCOUNTER — Telehealth: Payer: Self-pay | Admitting: Family Medicine

## 2021-04-01 ENCOUNTER — Other Ambulatory Visit: Payer: Self-pay | Admitting: Family Medicine

## 2021-04-01 ENCOUNTER — Encounter: Payer: Self-pay | Admitting: Family Medicine

## 2021-04-01 MED ORDER — AMLODIPINE BESYLATE 5 MG PO TABS: 5.0000 mg | ORAL_TABLET | Freq: Every day | ORAL | Status: DC

## 2021-04-11 ENCOUNTER — Ambulatory Visit (INDEPENDENT_AMBULATORY_CARE_PROVIDER_SITE_OTHER): Payer: Medicare Other | Admitting: Family Medicine

## 2021-04-11 ENCOUNTER — Encounter: Payer: Self-pay | Admitting: Family Medicine

## 2021-04-11 ENCOUNTER — Other Ambulatory Visit: Payer: Self-pay

## 2021-04-11 VITALS — BP 132/78 | HR 104 | Temp 97.8°F | Ht 76.0 in | Wt 231.0 lb

## 2021-04-11 DIAGNOSIS — E119 Type 2 diabetes mellitus without complications: Secondary | ICD-10-CM | POA: Diagnosis not present

## 2021-04-11 DIAGNOSIS — R413 Other amnesia: Secondary | ICD-10-CM | POA: Diagnosis not present

## 2021-04-11 DIAGNOSIS — I10 Essential (primary) hypertension: Secondary | ICD-10-CM

## 2021-04-11 DIAGNOSIS — E538 Deficiency of other specified B group vitamins: Secondary | ICD-10-CM | POA: Diagnosis not present

## 2021-04-11 DIAGNOSIS — Z23 Encounter for immunization: Secondary | ICD-10-CM

## 2021-04-11 MED ORDER — AMLODIPINE BESYLATE 5 MG PO TABS: 5.0000 mg | ORAL_TABLET | Freq: Every day | ORAL | 3 refills | Status: DC

## 2021-04-11 NOTE — Progress Notes (Signed)
This visit occurred during the SARS-CoV-2 public health emergency.  Safety protocols were in place, including screening questions prior to the visit, additional usage of staff PPE, and extensive cleaning of exam room while observing appropriate contact time as indicated for disinfecting solutions.  See avs re: B12.   Here today with his son.  Memory changes.  Short term troubles noted by patient and family.  I am awaiting his consult note regarding memory testing.  We talked about potentially adding on Aricept but I would like to see his memory testing results first.  Family helping with meds.  Cautions re: taking extra doses of his meds.  Family checking on automatic pill dispenser.    Hypertension:    Using medication without problems or lightheadedness: yes Chest pain with exertion:no Edema:no Short of breath: at baseline, no changed.   Added on amlodipine and BP improved, d/w pt.    Meds, vitals, and allergies reviewed.   ROS: Per HPI unless specifically indicated in ROS section   GEN: nad, alert and oriented HEENT: ncat NECK: supple w/o LA CV: IRR, not tachy.  Recheck pulse 90s, IRR PULM: ctab, no inc wob ABD: soft, +bs EXT: no edema SKIN: well perfused.   30 minutes were devoted to patient care in this encounter (this includes time spent reviewing the patient's file/history, interviewing and examining the patient, counseling/reviewing plan with patient).

## 2021-04-11 NOTE — Patient Instructions (Addendum)
See about getting a B12 shot set up monthly at [facility].  [address] [place], [place] [address]. [phone]  Plan on recheck A1c in about 3 months at a visit with Damita Dunnings.  Take care.  Glad to see you.

## 2021-04-13 NOTE — Assessment & Plan Note (Signed)
Improved.  Continue amlodipine.  See above regarding automatic pill dispenser to help with med administration.

## 2021-04-13 NOTE — Assessment & Plan Note (Signed)
See after visit summary.

## 2021-04-13 NOTE — Assessment & Plan Note (Signed)
Short term troubles noted by patient and family.  I am awaiting his consult note regarding memory testing.  We talked about potentially adding on Aricept but I would like to see his memory testing results first.  Family helping with meds.  Cautions re: taking extra doses of his meds.  Family checking on automatic pill dispenser.

## 2021-04-13 NOTE — Assessment & Plan Note (Signed)
No change in medication today but will plan on recheck A1c in about 3 months at a visit.  See after visit summary.

## 2021-04-17 ENCOUNTER — Encounter: Payer: Self-pay | Admitting: Physician Assistant

## 2021-04-17 ENCOUNTER — Other Ambulatory Visit: Payer: Self-pay

## 2021-04-17 ENCOUNTER — Ambulatory Visit: Payer: Medicare Other | Admitting: Physician Assistant

## 2021-04-17 VITALS — BP 138/78 | HR 58 | Ht 76.0 in | Wt 228.0 lb

## 2021-04-17 DIAGNOSIS — N401 Enlarged prostate with lower urinary tract symptoms: Secondary | ICD-10-CM | POA: Diagnosis not present

## 2021-04-17 DIAGNOSIS — R338 Other retention of urine: Secondary | ICD-10-CM

## 2021-04-17 LAB — BLADDER SCAN AMB NON-IMAGING

## 2021-04-17 NOTE — Progress Notes (Signed)
04/17/2021 2:32 PM   Jonathan York June 09, 1933 [id number]  CC: Chief Complaint  Patient presents with   Benign Prostatic Hypertrophy   HPI: Jonathan York is a 87 y.o. male with PMH BPH with urinary retention s/p HOLEP in February 2021 with some postoperative stress incontinence who presents today for annual follow-up.  He is accompanied today by his son, who contributes to HPI.  Today he reports no acute concerns.  He feels he is emptying his bladder well and has minimal to no stress incontinence.  IPSS 2/pleased as below, previously 12/terrible.  PVR 2 mL.   IPSS     Row Name 04/17/21 1000         International Prostate Symptom Score   How often have you had the sensation of not emptying your bladder? Not at All     How often have you had to urinate less than every two hours? Not at All     How often have you found you stopped and started again several times when you urinated? Not at All     How often have you found it difficult to postpone urination? Not at All     How often have you had a weak urinary stream? Less than half the time     How often have you had to strain to start urination? Not at All     How many times did you typically get up at night to urinate? None     Total IPSS Score 2           Quality of Life due to urinary symptoms   If you were to spend the rest of your life with your urinary condition just the way it is now how would you feel about that? Pleased              PMH: Past Medical History:  Diagnosis Date   Abdominal bruit 05/05/2012   Arthritis    back pain, past lumbar surgery    BPH (benign prostatic hypertrophy)    Carotid arterial disease ([place])    a. s/p left-sided CEA   Chronic atrial fibrillation ([place])    a. CHADS2VASc => 5 (HTN, age x 2, DM, vascular disease); Eliquis   COPD (chronic obstructive pulmonary disease) ([facility])    Diabetes mellitus type II    diet controlled    Dyspnea    HLD (hyperlipidemia)    HTN  (hypertension)    Lung cancer ([facility])    a. s/p resection and chemo in 2002   TIA (transient ischemic attack) 2019    Surgical History: Past Surgical History:  Procedure Laterality Date   ARCH AORTOGRAM  05/27/2012   Procedure: ARCH AORTOGRAM;  Surgeon: Elam Dutch, MD;  Location: [facility] CATH LAB;  Service: Cardiovascular;;   BACK SURGERY     CARDIOVERSION  06/29/2012   Procedure: CARDIOVERSION;  Surgeon: Thayer Headings, MD;  Location: [place];  Service: Cardiovascular;  Laterality: N/A;   CAROTID ANGIOGRAM N/A 05/27/2012   Procedure: CAROTID Cyril Loosen;  Surgeon: Elam Dutch, MD;  Location: [facility] CATH LAB;  Service: Cardiovascular;  Laterality: N/A;   CAROTID ENDARTERECTOMY Left 05/30/12   CATARACT EXTRACTION     right   ENDARTERECTOMY  05/30/2012   Procedure: ENDARTERECTOMY CAROTID;  Surgeon: Elam Dutch, MD;  Location: [place];  Service: Vascular;  Laterality: Left;   ENDARTERECTOMY Right 04/25/2018   Procedure: ENDARTERECTOMY RIGHT CAROTID;  Surgeon: Elam Dutch, MD;  Location[facility]  OR;  Service: Vascular;  Laterality: Right;   EYE SURGERY     R detached retina- 1990's, IOL implants also    [place] WITH MORCELLATION N/A 08/28/2019   Procedure: HOLEP-LASER ENUCLEATION OF THE PROSTATE WITH MORCELLATION;  Surgeon: Hollice Espy, MD;  Location: [facility] ORS;  Service: Urology;  Laterality: N/A;   JOINT REPLACEMENT     knee- bilateral- 2009 and 2012   LOBECTOMY  03/15/01   LUL ([place])   NOSE SURGERY     PATCH ANGIOPLASTY Right 04/25/2018   Procedure: PATCH ANGIOPLASTY RIGHT CAROTID ARTERY USING HEMASHILED PLATINUM FINESSE PATCH;  Surgeon: Elam Dutch, MD;  Location: [facility];  Service: Vascular;  Laterality: Right;   RETINAL DETACHMENT SURGERY     right   SPINE SURGERY  approx 2006   Ruptered Disc   TONSILLECTOMY     1939   TOTAL KNEE ARTHROPLASTY     x2 per Dr. Zane Herald RESECTION  03/15/01   LUL  [place])    Home Medications:  Allergies as of 04/17/2021       Reactions   Xarelto [rivaroxaban] Other (See Comments)   vertigo        Medication List        Accurate as of April 17, 2021  2:32 PM. If you have any questions, ask your nurse or doctor.          amLODipine 5 MG tablet Commonly known as: NORVASC Take 1 tablet (5 mg total) by mouth daily.   apixaban 5 MG Tabs tablet Commonly known as: Eliquis Take 1 tablet (5 mg total) by mouth 2 (two) times daily.   atorvastatin 80 MG tablet Commonly known as: LIPITOR Take 1 tablet (80 mg total) by mouth daily.   cyanocobalamin 1000 MCG/ML injection Commonly known as: (VITAMIN B-12) 1000 mcg IM monthly.   metFORMIN 500 MG tablet Commonly known as: GLUCOPHAGE Take 1 tablet (500 mg total) by mouth 2 (two) times daily with a meal.        Allergies:  Allergies  Allergen Reactions   Xarelto [Rivaroxaban] Other (See Comments)    vertigo    Family History: Family History  Problem Relation Age of Onset   Emphysema Father    Dementia Mother    Diabetes Mother    Hypertension Mother    Hypothyroidism Sister    Dementia Brother    Hypertension Brother    Diabetes Son    Heart disease Son        Heart Disease before age 32- Open Heart 2010   Hypertension Son    Stroke Son        while on coumadin   Prostate cancer Neg Hx    Colon cancer Neg Hx     Social History:   reports that he quit smoking about 38 years ago. His smoking use included cigarettes. He has a 32.00 pack-year smoking history. He has never used smokeless tobacco. He reports that he does not drink alcohol and does not use drugs.  Physical Exam: BP 138/78   Pulse (!) 58   Ht 6\' 4"  (1.93 m)   Wt 228 lb (103.4 kg)   BMI 27.75 kg/m   Constitutional:  Alert and oriented, no acute distress, nontoxic appearing HEENT: [place], AT Cardiovascular: No clubbing, cyanosis, or edema Respiratory: Normal respiratory effort, no increased work of  breathing Skin: No rashes, bruises or suspicious lesions Neurologic: Grossly intact, no focal  deficits, moving all 4 extremities Psychiatric: Normal mood and affect  Laboratory Data: Results for orders placed or performed in visit on 04/17/21  Bladder Scan (Post Void Residual) in office  Result Value Ref Range   Scan Result 99mL    Assessment & Plan:   1. Benign prostatic hyperplasia with urinary retention Patient is emptying extremely well and minimally symptomatic with minimal to no ongoing stress incontinence.  No further urologic intervention indicated.  We discussed that he may follow-up with our clinic as needed.  He is in agreement with this plan. - Bladder Scan (Post Void Residual) in office  Return if symptoms worsen or fail to improve.  Debroah Loop, PA-C  [facility] [address], [place], [place] [address] [phone]

## 2021-04-22 NOTE — Telephone Encounter (Signed)
error 

## 2021-05-06 ENCOUNTER — Encounter: Payer: Self-pay | Admitting: Family Medicine

## 2021-05-21 ENCOUNTER — Telehealth: Payer: Self-pay

## 2021-05-21 NOTE — Telephone Encounter (Signed)
Noted. Thanks.

## 2021-05-21 NOTE — Telephone Encounter (Signed)
Called and spoke with patients son Jonathan York today about memory testing results and recommendations. Jonathan York stated that patient is doing really good right now. Stated that they were able to get him an automated pill dispenser so patient is only able to take what is dispensed and no others. Him and his brother check on patient at least twice a day to be sure everything is okay since he is still living alone. Jonathan York is going to look into Aricept before deciding on if patient should take or not and will let us know what they decide.

## 2021-06-19 ENCOUNTER — Ambulatory Visit (INDEPENDENT_AMBULATORY_CARE_PROVIDER_SITE_OTHER): Payer: Medicare Other

## 2021-06-19 ENCOUNTER — Other Ambulatory Visit: Payer: Self-pay

## 2021-06-19 DIAGNOSIS — E538 Deficiency of other specified B group vitamins: Secondary | ICD-10-CM | POA: Diagnosis not present

## 2021-06-19 MED ORDER — CYANOCOBALAMIN 1000 MCG/ML IJ SOLN
1000.0000 ug | Freq: Once | INTRAMUSCULAR | Status: AC
  Administered 2021-06-19: 1000 ug via INTRAMUSCULAR

## 2021-06-19 NOTE — Progress Notes (Signed)
Per orders of Dr. Damita Dunnings, monthly injection of B12 given by Loreen Freud. Patient tolerated injection well.

## 2021-07-17 NOTE — Progress Notes (Signed)
Subjective:   Jonathan York is a 87 y.o. male who presents for Medicare Annual/Subsequent preventive examination.  I connected with Jonathan York today by telephone and verified that I am speaking with the correct person using two identifiers. Location patient: home Location provider: work Persons participating in the virtual visit: patient, Marine scientist.    I discussed the limitations, risks, security and privacy concerns of performing an evaluation and management service by telephone and the availability of in person appointments. I also discussed with the patient that there may be a patient responsible charge related to this service. The patient expressed understanding and verbally consented to this telephonic visit.    Interactive audio and video telecommunications were attempted between this provider and patient, however failed, due to patient having technical difficulties OR patient did not have access to video capability.  We continued and completed visit with audio only.  Some vital signs may be absent or patient reported.   Time Spent with patient on telephone encounter: 20 minutes  Review of Systems     Cardiac Risk Factors include: advanced age (>50men, >26 women);diabetes mellitus;dyslipidemia;hypertension     Objective:    Today's Vitals   07/22/21 1359  Weight: 228 lb (103.4 kg)  Height: 6\' 4"  (1.93 m)   Body mass index is 27.75 kg/m.  Advanced Directives 07/22/2021 08/28/2019 08/23/2019 06/01/2019 03/13/2019 05/12/2018 04/25/2018  Does Patient Have a Medical Advance Directive? No Yes Yes Yes Yes Yes Yes  Type of Advance Directive - [place];Living will [place];Living will [place];Living will [place];Living will [place];Living will [place]  Does patient want to make changes to medical advance directive? - No - Patient declined No - Patient declined No -  Patient declined - - No - Patient declined  Copy of [place] in Chart? - No - copy requested No - copy requested - No - copy requested No - copy requested No - copy requested  Pre-existing out of facility DNR order (yellow form or pink MOST form) - - - - - - -    Current Medications (verified) Outpatient Encounter Medications as of 07/22/2021  Medication Sig   amLODipine (NORVASC) 5 MG tablet Take 1 tablet (5 mg total) by mouth daily.   apixaban (ELIQUIS) 5 MG TABS tablet Take 1 tablet (5 mg total) by mouth 2 (two) times daily.   atorvastatin (LIPITOR) 80 MG tablet Take 1 tablet (80 mg total) by mouth daily.   cyanocobalamin (,VITAMIN B-12,) 1000 MCG/ML injection 1000 mcg IM monthly.   metFORMIN (GLUCOPHAGE) 500 MG tablet Take 1 tablet (500 mg total) by mouth 2 (two) times daily with a meal.   No facility-administered encounter medications on file as of 07/22/2021.    Allergies (verified) Xarelto [rivaroxaban]   History: Past Medical History:  Diagnosis Date   Abdominal bruit 05/05/2012   Arthritis    back pain, past lumbar surgery    BPH (benign prostatic hypertrophy)    Carotid arterial disease ([place])    a. s/p left-sided CEA   Chronic atrial fibrillation ([place])    a. CHADS2VASc => 5 (HTN, age x 2, DM, vascular disease); Eliquis   COPD (chronic obstructive pulmonary disease) (HCC)    Diabetes mellitus type II    diet controlled    Dyspnea    HLD (hyperlipidemia)    HTN (hypertension)    Lung cancer (HCC)    a. s/p  resection and chemo in 2002   TIA (transient ischemic attack) 2019   Past Surgical History:  Procedure Laterality Date   ARCH AORTOGRAM  05/27/2012   Procedure: ARCH AORTOGRAM;  Surgeon: Elam Dutch, MD;  Location: [facility] CATH LAB;  Service: Cardiovascular;;   BACK SURGERY     CARDIOVERSION  06/29/2012   Procedure: CARDIOVERSION;  Surgeon: Thayer Headings, MD;  Location: [place];  Service: Cardiovascular;  Laterality: N/A;   CAROTID  ANGIOGRAM N/A 05/27/2012   Procedure: CAROTID Cyril Loosen;  Surgeon: Elam Dutch, MD;  Location: [facility] CATH LAB;  Service: Cardiovascular;  Laterality: N/A;   CAROTID ENDARTERECTOMY Left 05/30/12   CATARACT EXTRACTION     right   ENDARTERECTOMY  05/30/2012   Procedure: ENDARTERECTOMY CAROTID;  Surgeon: Elam Dutch, MD;  Location: [place];  Service: Vascular;  Laterality: Left;   ENDARTERECTOMY Right 04/25/2018   Procedure: ENDARTERECTOMY RIGHT CAROTID;  Surgeon: Elam Dutch, MD;  Location: [place];  Service: Vascular;  Laterality: Right;   EYE SURGERY     R detached retina- 1990's, IOL implants also    [place] WITH MORCELLATION N/A 08/28/2019   Procedure: HOLEP-LASER ENUCLEATION OF THE PROSTATE WITH MORCELLATION;  Surgeon: Hollice Espy, MD;  Location: [facility] ORS;  Service: Urology;  Laterality: N/A;   JOINT REPLACEMENT     knee- bilateral- 2009 and 2012   LOBECTOMY  03/15/01   LUL ([place])   NOSE SURGERY     PATCH ANGIOPLASTY Right 04/25/2018   Procedure: PATCH ANGIOPLASTY RIGHT CAROTID ARTERY USING HEMASHILED PLATINUM FINESSE PATCH;  Surgeon: Elam Dutch, MD;  Location: [facility] OR;  Service: Vascular;  Laterality: Right;   RETINAL DETACHMENT SURGERY     right   SPINE SURGERY  approx 2006   Ruptered Disc   TONSILLECTOMY     1939   TOTAL KNEE ARTHROPLASTY     x2 per Dr. Zane Herald RESECTION  03/15/01   LUL Roxan Hockey)   Family History  Problem Relation Age of Onset   Emphysema Father    Dementia Mother    Diabetes Mother    Hypertension Mother    Hypothyroidism Sister    Dementia Brother    Hypertension Brother    Diabetes Son    Heart disease Son        Heart Disease before age 25- Open Heart 2010   Hypertension Son    Stroke Son        while on coumadin   Prostate cancer Neg Hx    Colon cancer Neg Hx    Social History   Socioeconomic History   Marital status: Widowed    Spouse name: Not on  file   Number of children: 3   Years of education: Not on file   Highest education level: Not on file  Occupational History   Occupation: retired-[employer]  Tobacco Use   Smoking status: Former    Packs/day: 1.00    Years: 32.00    Pack years: 32.00    Types: Cigarettes    Quit date: 09/03/1982    Years since quitting: 38.9   Smokeless tobacco: Never  Vaping Use   Vaping Use: Never used  Substance and Sexual Activity   Alcohol use: No    Alcohol/week: 0.0 standard drinks    Comment:     Drug use: No   Sexual activity: Yes  Other Topics Concern   Not on  file  Social History Narrative   Retired Personal assistant   From [employer]   widowed 2021   Married 1964   Social Determinants of Radio broadcast assistant Strain: Low Risk    Difficulty of Paying Living Expenses: Not hard at all  Food Insecurity: No Food Insecurity   Worried About Charity fundraiser in the Last Year: Never true   Arboriculturist in the Last Year: Never true  Transportation Needs: No Transportation Needs   Lack of Transportation (Medical): No   Lack of Transportation (Non-Medical): No  Physical Activity: Inactive   Days of Exercise per Week: 0 days   Minutes of Exercise per Session: 0 min  Stress: No Stress Concern Present   Feeling of Stress : Not at all  Social Connections: Moderately Integrated   Frequency of Communication with Friends and Family: Once a week   Frequency of Social Gatherings with Friends and Family: More than three times a week   Attends Religious Services: More than 4 times per year   Active Member of [employer] or Organizations: No   Attends Music therapist: Never   Marital Status: Married    Tobacco Counseling Counseling given: Not Answered   Clinical Intake:  Pre-visit preparation completed: Yes  Pain : No/denies pain     BMI - recorded: 27.75 Nutritional Status: BMI 25 -29 Overweight Nutritional Risks: None Diabetes: Yes CBG done?: No Did  pt. bring in CBG monitor from home?: No  How often do you need to have someone help you when you read instructions, pamphlets, or other written materials from your doctor or pharmacy?: 1 - Never  Diabetes:  Is the patient diabetic?  Yes  If diabetic, was a CBG obtained today?  No  Did the patient bring in their glucometer from home?  No  How often do you monitor your CBG's? Patient in not monitoring.   Financial Strains and Diabetes Management:  Are you having any financial strains with the device, your supplies or your medication? No .  Does the patient want to be seen by Chronic Care Management for management of their diabetes?  No  Would the patient like to be referred to a Nutritionist or for Diabetic Management?  No   Diabetic Exams:  Diabetic Eye Exam: Completed 11/12/20.   Diabetic Foot Exam: Completed 02/17/21.   Interpreter Needed?: No  Information entered by :: Orrin Brigham LPN   Activities of Daily Living In your present state of health, do you have any difficulty performing the following activities: 07/22/2021  Hearing? Y  Vision? Y  Difficulty concentrating or making decisions? N  Walking or climbing stairs? N  Dressing or bathing? N  Doing errands, shopping? N  Preparing Food and eating ? N  Using the Toilet? N  In the past six months, have you accidently leaked urine? N  Do you have problems with loss of bowel control? N  Managing your Medications? N  Managing your Finances? N  Housekeeping or managing your Housekeeping? N  Some recent data might be hidden    Patient Care Team: Tonia Ghent, MD as PCP - General (Family Medicine) [place], Peter M, MD (Cardiology) Wellington Hampshire, MD as Consulting Physician (Cardiology) Hayden Pedro, MD as Consulting Physician (Ophthalmology)  Indicate any recent Medical Services you may have received from other than [facility] providers in the past year (date may be approximate).     Assessment:   This is a routine  wellness  examination for [place].  Hearing/Vision screen Hearing Screening - Comments:: Decrease in hearing Vision Screening - Comments:: Last exam 11/12/2020,wears glasses  Dietary issues and exercise activities discussed: Current Exercise Habits: The patient does not participate in regular exercise at present   Goals Addressed             This Visit's Progress    Patient Stated       Would like to exercise more       Depression Screen PHQ 2/9 Scores 07/22/2021 03/13/2019 03/11/2018 02/18/2017 02/14/2016 09/10/2015 02/11/2015  PHQ - 2 Score 0 0 0 0 0 0 0  PHQ- 9 Score - 6 - - - - -    Fall Risk Fall Risk  07/22/2021 11/19/2020 03/13/2019 03/16/2018 03/11/2018  Falls in the past year? 0 0 0 No No  Number falls in past yr: 0 0 - - -  Injury with Fall? 0 0 - - -  Risk for fall due to : No Fall Risks - Medication side effect - -  Follow up Falls prevention discussed Falls evaluation completed Falls evaluation completed;Falls prevention discussed - -    FALL RISK PREVENTION PERTAINING TO THE HOME:  Any stairs in or around the home? No  If so, are there any without handrails? No  Home free of loose throw rugs in walkways, pet beds, electrical cords, etc? Yes  Adequate lighting in your home to reduce risk of falls? Yes   ASSISTIVE DEVICES UTILIZED TO PREVENT FALLS:  Life alert? No  Use of a cane, walker or w/c? Yes  Grab bars in the bathroom? No  Shower chair or bench in shower? No  Elevated toilet seat or a handicapped toilet? No   TIMED UP AND GO:  Was the test performed? No .    Cognitive Function: Normal cognitive status assessed by  this Nurse Health Advisor. No abnormalities found.   MMSE - Mini Mental State Exam 03/13/2019 09/10/2015  Orientation to time 4 5  Orientation to Place 5 5  Registration 3 3  Attention/ Calculation 5 5  Recall 3 3  Language- name 2 objects 0 -  Language- repeat 1 1  Language- follow 3 step command 0 3  Language- read & follow direction 0 1   Write a sentence 0 -  Copy design 0 -  Total score 21 -        Immunizations Immunization History  Administered Date(s) Administered   Fluad Quad(high Dose 65+) 04/18/2019, 05/09/2020, 04/11/2021   Influenza Split 05/27/2011, 04/27/2012   Influenza Whole 05/20/2004, 04/20/2007, 04/09/2008, 04/30/2009, 04/08/2010   Influenza, High Dose Seasonal PF 05/12/2018   Influenza,inj,Quad PF,6+ Mos 03/29/2013, 04/05/2014, 04/26/2015, 04/24/2016, 05/04/2017   PFIZER(Purple Top)SARS-COV-2 Vaccination 09/02/2019, 09/23/2019   Pneumococcal Conjugate-13 02/11/2015   Pneumococcal Polysaccharide-23 03/09/2002, 03/03/2008   Td 09/14/2002, 02/14/2016   Zoster, Live 01/02/2010, 01/04/2010    TDAP status: Up to date  Flu Vaccine status: Up to date  Pneumococcal vaccine status: Up to date  Covid-19 vaccine status: Declined, Education has been provided regarding the importance of this vaccine but patient still declined. Advised may receive this vaccine at local pharmacy or Health Dept.or vaccine clinic. Aware to provide a copy of the vaccination record if obtained from local pharmacy or Health Dept. Verbalized acceptance and understanding.  Qualifies for Shingles Vaccine? Yes   Zostavax completed Yes   Shingrix Completed?: Yes  Screening Tests Health Maintenance  Topic Date Due   Zoster Vaccines- Shingrix (1 of 2) Never done  COVID-19 Vaccine (3 - Pfizer risk series) 10/21/2019   HEMOGLOBIN A1C  08/20/2021   OPHTHALMOLOGY EXAM  11/12/2021   FOOT EXAM  02/17/2022   TETANUS/TDAP  02/13/2026   Pneumonia Vaccine 26+ Years old  Completed   INFLUENZA VACCINE  Completed   HPV VACCINES  Aged Out    Health Maintenance  Health Maintenance Due  Topic Date Due   Zoster Vaccines- Shingrix (1 of 2) Never done   COVID-19 Vaccine (3 - Pfizer risk series) 10/21/2019    Colorectal cancer screening: No longer required.   Lung Cancer Screening: (Low Dose CT Chest recommended if Age 40-80 years, 30  pack-year currently smoking OR have quit w/in 15years.) does qualify.     Additional Screening:  Hepatitis C Screening: does not qualify  Vision Screening: Recommended annual ophthalmology exams for early detection of glaucoma and other disorders of the eye. Is the patient up to date with their annual eye exam?  Yes  Who is the provider or what is the name of the office in which the patient attends annual eye exams? Provider information unavailable   Dental Screening: Recommended annual dental exams for proper oral hygiene  Community Resource Referral / Chronic Care Management: CRR required this visit?  No   CCM required this visit?  No      Plan:     I have personally reviewed and noted the following in the patients chart:   Medical and social history Use of alcohol, tobacco or illicit drugs  Current medications and supplements including opioid prescriptions. Patient is not currently taking opioid prescriptions. Functional ability and status Nutritional status Physical activity Advanced directives List of other physicians Hospitalizations, surgeries, and ER visits in previous 12 months Vitals Screenings to include cognitive, depression, and falls Referrals and appointments  In addition, I have reviewed and discussed with patient certain preventive protocols, quality metrics, and best practice recommendations. A written personalized care plan for preventive services as well as general preventive health recommendations were provided to patient.   Due to this being a telephonic visit, the after visit summary with patients personalized plan was offered to patient via mail or my-chart. Patient would like to access on my-chart.     Loma Messing, LPN  09/23/4825   Nurse Health Advisor  Nurse Notes: none

## 2021-07-22 ENCOUNTER — Ambulatory Visit (INDEPENDENT_AMBULATORY_CARE_PROVIDER_SITE_OTHER): Payer: Medicare Other

## 2021-07-22 VITALS — Ht 76.0 in | Wt 228.0 lb

## 2021-07-22 DIAGNOSIS — Z0000 Encounter for general adult medical examination without abnormal findings: Secondary | ICD-10-CM

## 2021-07-22 NOTE — Patient Instructions (Signed)
Jonathan York , Thank you for taking time to complete your Medicare Wellness Visit. I appreciate your ongoing commitment to your health goals. Please review the following plan we discussed and let me know if I can assist you in the future.   Screening recommendations/referrals: Colonoscopy: no longer required Recommended yearly ophthalmology/optometry visit for glaucoma screening and checkup Recommended yearly dental visit for hygiene and checkup  Vaccinations: Influenza vaccine: up to date Pneumococcal vaccine: up to date  Tdap vaccine: up to date completed 02/14/16, due 02/13/26 Shingles vaccine: Discuss with your local pharmacy  Covid-19: newest booster available at your local pharmacy  Advanced directives: information available at your next appointment  Conditions/risks identified: see problem list  Next appointment: Follow up in one year for your annual wellness visit. 07/24/22 @ 12:00pm, this will be a telephone visit  Preventive Care 55 Years and Older, Male Preventive care refers to lifestyle choices and visits with your health care provider that can promote health and wellness. What does preventive care include? A yearly physical exam. This is also called an annual well check. Dental exams once or twice a year. Routine eye exams. Ask your health care provider how often you should have your eyes checked. Personal lifestyle choices, including: Daily care of your teeth and gums. Regular physical activity. Eating a healthy diet. Avoiding tobacco and drug use. Limiting alcohol use. Practicing safe sex. Taking low doses of aspirin every day. Taking vitamin and mineral supplements as recommended by your health care provider. What happens during an annual well check? The services and screenings done by your health care provider during your annual well check will depend on your age, overall health, lifestyle risk factors, and family history of disease. Counseling  Your health care  provider may ask you questions about your: Alcohol use. Tobacco use. Drug use. Emotional well-being. Home and relationship well-being. Sexual activity. Eating habits. History of falls. Memory and ability to understand (cognition). Work and work Statistician. Screening  You may have the following tests or measurements: Height, weight, and BMI. Blood pressure. Lipid and cholesterol levels. These may be checked every 5 years, or more frequently if you are over 44 years old. Skin check. Lung cancer screening. You may have this screening every year starting at age 44 if you have a 30-pack-year history of smoking and currently smoke or have quit within the past 15 years. Fecal occult blood test (FOBT) of the stool. You may have this test every year starting at age 78. Flexible sigmoidoscopy or colonoscopy. You may have a sigmoidoscopy every 5 years or a colonoscopy every 10 years starting at age 80. Prostate cancer screening. Recommendations will vary depending on your family history and other risks. Hepatitis C blood test. Hepatitis B blood test. Sexually transmitted disease (STD) testing. Diabetes screening. This is done by checking your blood sugar (glucose) after you have not eaten for a while (fasting). You may have this done every 1-3 years. Abdominal aortic aneurysm (AAA) screening. You may need this if you are a current or former smoker. Osteoporosis. You may be screened starting at age 25 if you are at high risk. Talk with your health care provider about your test results, treatment options, and if necessary, the need for more tests. Vaccines  Your health care provider may recommend certain vaccines, such as: Influenza vaccine. This is recommended every year. Tetanus, diphtheria, and acellular pertussis (Tdap, Td) vaccine. You may need a Td booster every 10 years. Zoster vaccine. You may need this after age  60. Pneumococcal 13-valent conjugate (PCV13) vaccine. One dose is  recommended after age 6. Pneumococcal polysaccharide (PPSV23) vaccine. One dose is recommended after age 50. Talk to your health care provider about which screenings and vaccines you need and how often you need them. This information is not intended to replace advice given to you by your health care provider. Make sure you discuss any questions you have with your health care provider. Document Released: 08/02/2015 Document Revised: 03/25/2016 Document Reviewed: 05/07/2015 Elsevier Interactive Patient Education  2017 [place] Prevention in the Home Falls can cause injuries. They can happen to people of all ages. There are many things you can do to make your home safe and to help prevent falls. What can I do on the outside of my home? Regularly fix the edges of walkways and driveways and fix any cracks. Remove anything that might make you trip as you walk through a door, such as a raised step or threshold. Trim any bushes or trees on the path to your home. Use bright outdoor lighting. Clear any walking paths of anything that might make someone trip, such as rocks or tools. Regularly check to see if handrails are loose or broken. Make sure that both sides of any steps have handrails. Any raised decks and porches should have guardrails on the edges. Have any leaves, snow, or ice cleared regularly. Use sand or salt on walking paths during winter. Clean up any spills in your garage right away. This includes oil or grease spills. What can I do in the bathroom? Use night lights. Install grab bars by the toilet and in the tub and shower. Do not use towel bars as grab bars. Use non-skid mats or decals in the tub or shower. If you need to sit down in the shower, use a plastic, non-slip stool. Keep the floor dry. Clean up any water that spills on the floor as soon as it happens. Remove soap buildup in the tub or shower regularly. Attach bath mats securely with double-sided non-slip rug  tape. Do not have throw rugs and other things on the floor that can make you trip. What can I do in the bedroom? Use night lights. Make sure that you have a light by your bed that is easy to reach. Do not use any sheets or blankets that are too big for your bed. They should not hang down onto the floor. Have a firm chair that has side arms. You can use this for support while you get dressed. Do not have throw rugs and other things on the floor that can make you trip. What can I do in the kitchen? Clean up any spills right away. Avoid walking on wet floors. Keep items that you use a lot in easy-to-reach places. If you need to reach something above you, use a strong step stool that has a grab bar. Keep electrical cords out of the way. Do not use floor polish or wax that makes floors slippery. If you must use wax, use non-skid floor wax. Do not have throw rugs and other things on the floor that can make you trip. What can I do with my stairs? Do not leave any items on the stairs. Make sure that there are handrails on both sides of the stairs and use them. Fix handrails that are broken or loose. Make sure that handrails are as long as the stairways. Check any carpeting to make sure that it is firmly attached to the stairs. Fix  any carpet that is loose or worn. Avoid having throw rugs at the top or bottom of the stairs. If you do have throw rugs, attach them to the floor with carpet tape. Make sure that you have a light switch at the top of the stairs and the bottom of the stairs. If you do not have them, ask someone to add them for you. What else can I do to help prevent falls? Wear shoes that: Do not have high heels. Have rubber bottoms. Are comfortable and fit you well. Are closed at the toe. Do not wear sandals. If you use a stepladder: Make sure that it is fully opened. Do not climb a closed stepladder. Make sure that both sides of the stepladder are locked into place. Ask someone to  hold it for you, if possible. Clearly mark and make sure that you can see: Any grab bars or handrails. First and last steps. Where the edge of each step is. Use tools that help you move around (mobility aids) if they are needed. These include: Canes. Walkers. Scooters. Crutches. Turn on the lights when you go into a dark area. Replace any light bulbs as soon as they burn out. Set up your furniture so you have a clear path. Avoid moving your furniture around. If any of your floors are uneven, fix them. If there are any pets around you, be aware of where they are. Review your medicines with your doctor. Some medicines can make you feel dizzy. This can increase your chance of falling. Ask your doctor what other things that you can do to help prevent falls. This information is not intended to replace advice given to you by your health care provider. Make sure you discuss any questions you have with your health care provider. Document Released: 05/02/2009 Document Revised: 12/12/2015 Document Reviewed: 08/10/2014 Elsevier Interactive Patient Education  2017 [employer].

## 2021-07-24 ENCOUNTER — Ambulatory Visit (INDEPENDENT_AMBULATORY_CARE_PROVIDER_SITE_OTHER): Payer: Medicare Other

## 2021-07-24 ENCOUNTER — Other Ambulatory Visit: Payer: Self-pay

## 2021-07-24 DIAGNOSIS — E538 Deficiency of other specified B group vitamins: Secondary | ICD-10-CM | POA: Diagnosis not present

## 2021-07-24 MED ORDER — CYANOCOBALAMIN 1000 MCG/ML IJ SOLN
1000.0000 ug | Freq: Once | INTRAMUSCULAR | Status: AC
  Administered 2021-07-24: 1000 ug via INTRAMUSCULAR

## 2021-07-24 NOTE — Progress Notes (Signed)
Per orders of Dr. Damita Dunnings, monthly injection of B12 given by Loreen Freud. Patient tolerated injection well.

## 2021-09-09 ENCOUNTER — Ambulatory Visit (INDEPENDENT_AMBULATORY_CARE_PROVIDER_SITE_OTHER): Payer: Medicare Other

## 2021-09-09 DIAGNOSIS — E538 Deficiency of other specified B group vitamins: Secondary | ICD-10-CM

## 2021-09-09 MED ORDER — CYANOCOBALAMIN 1000 MCG/ML IJ SOLN
1000.0000 ug | Freq: Once | INTRAMUSCULAR | Status: AC
  Administered 2021-09-09: 1000 ug via INTRAMUSCULAR

## 2021-09-09 NOTE — Progress Notes (Signed)
Per orders of Dr. Diona Browner, injection of vit O72 given by Brenton Grills. Patient tolerated injection well.

## 2021-09-21 ENCOUNTER — Telehealth: Payer: Self-pay | Admitting: Family Medicine

## 2021-09-21 DIAGNOSIS — R918 Other nonspecific abnormal finding of lung field: Secondary | ICD-10-CM

## 2021-09-21 NOTE — Telephone Encounter (Signed)
Please call patient/family.  Patient is due for follow-up CT scan regarding lesion previously seen in his chest.  This is a routine 2-year follow-up.  I preemptively put in the order but I did not sign it yet.  I want him to know that we are calling him about this before he gets a call about scheduling.  Please let me know when you can talk to him so I can sign the order.  Thanks. ?

## 2021-09-22 NOTE — Telephone Encounter (Signed)
LMTCB

## 2021-09-30 NOTE — Telephone Encounter (Signed)
Spoke with patient and he is okay with getting f/u CT scan done  ?

## 2021-10-01 NOTE — Telephone Encounter (Signed)
Signed the order.  Thanks.  ?

## 2021-10-17 ENCOUNTER — Ambulatory Visit (INDEPENDENT_AMBULATORY_CARE_PROVIDER_SITE_OTHER): Payer: Medicare Other | Admitting: Family Medicine

## 2021-10-17 ENCOUNTER — Encounter: Payer: Self-pay | Admitting: Family Medicine

## 2021-10-17 VITALS — BP 130/80 | HR 59 | Temp 97.6°F | Ht 76.0 in | Wt 226.0 lb

## 2021-10-17 DIAGNOSIS — E119 Type 2 diabetes mellitus without complications: Secondary | ICD-10-CM

## 2021-10-17 DIAGNOSIS — E538 Deficiency of other specified B group vitamins: Secondary | ICD-10-CM

## 2021-10-17 DIAGNOSIS — R413 Other amnesia: Secondary | ICD-10-CM

## 2021-10-17 LAB — LIPID PANEL
Cholesterol: 141 mg/dL (ref 0–200)
HDL: 45.8 mg/dL (ref >39.00)
LDL Cholesterol: 75 mg/dL (ref 0–99)
NonHDL: 94.97
Total CHOL/HDL Ratio: 3
Triglycerides: 99 mg/dL (ref 0.0–149.0)
VLDL: 19.8 mg/dL (ref 0.0–40.0)

## 2021-10-17 LAB — COMPREHENSIVE METABOLIC PANEL
ALT: 16 U/L (ref 0–53)
AST: 15 U/L (ref 0–37)
Albumin: 4.2 g/dL (ref 3.5–5.2)
Alkaline Phosphatase: 56 U/L (ref 39–117)
BUN: 14 mg/dL (ref 6–23)
CO2: 31 mEq/L (ref 19–32)
Calcium: 9.5 mg/dL (ref 8.4–10.5)
Chloride: 101 mEq/L (ref 96–112)
Creatinine, Ser: 1.11 mg/dL (ref 0.40–1.50)
GFR: 60.18 mL/min (ref >60.00)
Glucose, Bld: 149 mg/dL — ABNORMAL HIGH (ref 70–99)
Potassium: 4.7 mEq/L (ref 3.5–5.1)
Sodium: 139 mEq/L (ref 135–145)
Total Bilirubin: 1.2 mg/dL (ref 0.2–1.2)
Total Protein: 6.4 g/dL (ref 6.0–8.3)

## 2021-10-17 LAB — CBC WITH DIFFERENTIAL/PLATELET
Basophils Absolute: 0.1 10*3/uL (ref 0.0–0.1)
Basophils Relative: 0.9 % (ref 0.0–3.0)
Eosinophils Absolute: 0.1 10*3/uL (ref 0.0–0.7)
Eosinophils Relative: 1.5 % (ref 0.0–5.0)
HCT: 41.7 % (ref 39.0–52.0)
Hemoglobin: 14.1 g/dL (ref 13.0–17.0)
Lymphocytes Relative: 21.1 % (ref 12.0–46.0)
Lymphs Abs: 1.8 10*3/uL (ref 0.7–4.0)
MCHC: 33.8 g/dL (ref 30.0–36.0)
MCV: 88.5 fl (ref 78.0–100.0)
Monocytes Absolute: 0.8 10*3/uL (ref 0.1–1.0)
Monocytes Relative: 10 % (ref 3.0–12.0)
Neutro Abs: 5.6 10*3/uL (ref 1.4–7.7)
Neutrophils Relative %: 66.5 % (ref 43.0–77.0)
Platelets: 227 10*3/uL (ref 150.0–400.0)
RBC: 4.72 Mil/uL (ref 4.22–5.81)
RDW: 14.5 % (ref 11.5–15.5)
WBC: 8.5 10*3/uL (ref 4.0–10.5)

## 2021-10-17 LAB — VITAMIN B12: Vitamin B-12: 287 pg/mL (ref 211–911)

## 2021-10-17 LAB — HEMOGLOBIN A1C: Hgb A1c MFr Bld: 8.3 % — ABNORMAL HIGH (ref 4.6–6.5)

## 2021-10-17 MED ORDER — CYANOCOBALAMIN 1000 MCG/ML IJ SOLN
1000.0000 ug | Freq: Once | INTRAMUSCULAR | Status: AC
  Administered 2021-10-17: 1000 ug via INTRAMUSCULAR

## 2021-10-17 NOTE — Patient Instructions (Addendum)
Go to the lab on the way out.   If you have mychart we'll likely use that to update you.    ?Take care.  Glad to see you. ?Update me as needed.   ?If you want hearing aids, let me know.   ?

## 2021-10-17 NOTE — Progress Notes (Signed)
Diabetes:  ?Using medications without difficulties: yes ?Hypoglycemic episodes: no sx  ?Hyperglycemic episodes: no sx  ?Feet problems: no ?Blood Sugars averaging: not checked.   ?Labs pending.  See notes on labs. ? ?CT pending regarding lung nodule.  Patient aware. ? ?B12 def.  Due for dose today and recheck labs.   ? ?HOH.  Recheck today re: hearing vs wax.  See exam. ? ?Neck sore with ROM.  Sore with rotation L and R but normal flex and extension.  Neck not stiff.  No recent pillow change.  No radicular pain.   See notes on labs. ? ?He noted short term memory changes.  Family here today, thought his memory loss was progressive.  Family is still checking on patient daily.  Using pill dispenser, to make it automatic.  Family has talked with patient about ALF.  Discussed not driving per prev recommendation.   ? ?Meds, vitals, and allergies reviewed.  ? ?ROS: Per HPI unless specifically indicated in ROS section  ? ?GEN: nad, alert and oriented ?HEENT: ncat, Intact bone and air conduction B with minimal wax and TM WNL B.  ?NECK: supple w/o LA, sore with rotation to left and right but normal flexion and extension.  Neck is not stiff. ?CV: rrr. ?PULM: ctab, no inc wob ?ABD: soft, +bs ?EXT: no edema ?SKIN: Well-perfused ? ?41 minutes were devoted to patient care in this encounter (this includes time spent reviewing the patient's file/history, interviewing and examining the patient, counseling/reviewing plan with patient).  ? ?

## 2021-10-19 MED ORDER — DONEPEZIL HCL 10 MG PO TABS: ORAL_TABLET | ORAL | 1 refills | Status: DC

## 2021-10-19 NOTE — Assessment & Plan Note (Signed)
Continue replacement.  See notes on labs. ?

## 2021-10-19 NOTE — Assessment & Plan Note (Signed)
Discussed checking his labs today and then likely starting on donepezil with routine cautions given to patient and son.  Family is helping with medication administration.  See orders.  Advised not to drive.  Discussed with his son that he is likely going to need increasing care and his son is talking with family/patient about that.  They are considering placement, probably so he would be closer to family. ?

## 2021-10-19 NOTE — Assessment & Plan Note (Signed)
Continue metformin for now.  I do not want to induce hypoglycemia.  See notes on labs. ?

## 2021-10-23 ENCOUNTER — Ambulatory Visit
Admission: RE | Admit: 2021-10-23 | Discharge: 2021-10-23 | Disposition: A | Discharge status: Home / Self Care | Payer: Medicare Other | Source: Ambulatory Visit | Attending: Family Medicine | Admitting: Family Medicine

## 2021-10-23 DIAGNOSIS — R911 Solitary pulmonary nodule: Secondary | ICD-10-CM | POA: Diagnosis not present

## 2021-10-23 DIAGNOSIS — R918 Other nonspecific abnormal finding of lung field: Secondary | ICD-10-CM | POA: Diagnosis not present

## 2021-11-12 ENCOUNTER — Encounter (INDEPENDENT_AMBULATORY_CARE_PROVIDER_SITE_OTHER): Payer: Medicare Other | Admitting: Ophthalmology

## 2021-11-12 DIAGNOSIS — H353132 Nonexudative age-related macular degeneration, bilateral, intermediate dry stage: Secondary | ICD-10-CM

## 2021-11-12 DIAGNOSIS — H35033 Hypertensive retinopathy, bilateral: Secondary | ICD-10-CM | POA: Diagnosis not present

## 2021-11-12 DIAGNOSIS — I10 Essential (primary) hypertension: Secondary | ICD-10-CM

## 2021-11-12 DIAGNOSIS — H43813 Vitreous degeneration, bilateral: Secondary | ICD-10-CM | POA: Diagnosis not present

## 2021-11-12 DIAGNOSIS — H35341 Macular cyst, hole, or pseudohole, right eye: Secondary | ICD-10-CM | POA: Diagnosis not present

## 2021-11-13 ENCOUNTER — Telehealth: Payer: Self-pay | Admitting: Family Medicine

## 2021-11-13 DIAGNOSIS — R918 Other nonspecific abnormal finding of lung field: Secondary | ICD-10-CM

## 2021-11-13 NOTE — Telephone Encounter (Signed)
Tried to call patients wife back to discuss what was needed. Patients number is Jonathan York which is patients son; whom is on DPR. I asked to speak with Jonathan York (patients wife) but son said I could not. He then proceeded to tell me he was the one that called earlier to discuss getting the PET scan done that was needed after the CT was done. Both him and the patient were out of town last week and wanted to wait for order to be done once they were both back in town. He would like the order to placed now. I advised [name] he should get a call to get him scheduled from radiology once this is done.  ?

## 2021-11-13 NOTE — Telephone Encounter (Signed)
Pt wife is calling about him needing a PET scan. Pt wife is not on his DPR but is requesting for the nurse to call her phone number.  ? ?Callback Number: [phone] ?

## 2021-11-16 NOTE — Addendum Note (Signed)
Addended by: Tonia Ghent on: 11/16/2021 06:40 PM ? ? Modules accepted: Orders ? ?

## 2021-11-16 NOTE — Telephone Encounter (Signed)
I put in the order for the PET scan.  I put in the scheduling instructions to call patient's son.  Thanks.   ?

## 2021-11-20 NOTE — Telephone Encounter (Signed)
Pt's son Jonathan York) is calling back and stated "he spoke with the nurse on 11/13/2021 about a pet scan. He has not heard anything since then, and would like an update on it."  ? ?Callback Number: [phone] [name]) ?

## 2021-11-23 ENCOUNTER — Encounter: Payer: Self-pay | Admitting: Family Medicine

## 2021-11-26 ENCOUNTER — Telehealth: Payer: Self-pay | Admitting: Family Medicine

## 2021-11-26 ENCOUNTER — Other Ambulatory Visit: Payer: Self-pay | Admitting: Family Medicine

## 2021-11-26 DIAGNOSIS — R918 Other nonspecific abnormal finding of lung field: Secondary | ICD-10-CM

## 2021-11-26 NOTE — Telephone Encounter (Signed)
Message routed back.   ? ?I put in the new order but I do not know how to remove the previous order.  I would appreciate it if you would take out the old order. ? ?Thank you for your help. ? ?Brigitte Pulse ? ?===View-only below this line=== ?----- Message ----- ?From: Roosvelt Maser ?Sent: 11/26/2021   1:40 PM EDT ?To: Tonia Ghent, MD ?Subject: Pet Scan                                      ? ? ? ?Dr. Damita Dunnings, ? ?The Pet you ordered NM(PSMA) is only for Prostate cancer ? ?For lung mass should be..  NM Pet restaging skull base to thinh(He had an initial staging scan back in 2019)  ? ?[id number] ? ?Thank you, ?Vivien Rota ?Team Lead ?Centralized Scheduling ?[place]  ? ?

## 2021-11-26 NOTE — Telephone Encounter (Signed)
Referral Notes ?Marland Kitchen ?Type Date User Summary Attachment  ?General 11/25/2021 11:14 AM Virl Cagey, CMA See Mychart message. Replied to patient message asking for contact  information.  -  ?Note:   ?See Mychart message. Replied to patient message asking for contact  information.  ?  ?Pt is going to call to schedule.  ?

## 2021-11-26 NOTE — Telephone Encounter (Signed)
Mychart message was sent to patient as well to call and schedule appt. 11/25/2021 ?

## 2021-11-30 ENCOUNTER — Other Ambulatory Visit: Payer: Self-pay | Admitting: Family Medicine

## 2021-11-30 MED ORDER — DIAZEPAM 2 MG PO TABS
1.0000 mg | ORAL_TABLET | Freq: Once | ORAL | 0 refills | Status: AC
Start: 2021-11-30 — End: 2021-11-30

## 2021-12-04 ENCOUNTER — Ambulatory Visit (HOSPITAL_COMMUNITY): Payer: Medicare Other

## 2021-12-05 ENCOUNTER — Ambulatory Visit (HOSPITAL_COMMUNITY)
Admission: RE | Admit: 2021-12-05 | Discharge: 2021-12-05 | Disposition: A | Discharge status: Home / Self Care | Payer: Medicare Other | Source: Ambulatory Visit | Attending: Family Medicine | Admitting: Family Medicine

## 2021-12-05 DIAGNOSIS — R911 Solitary pulmonary nodule: Secondary | ICD-10-CM | POA: Diagnosis not present

## 2021-12-05 DIAGNOSIS — R918 Other nonspecific abnormal finding of lung field: Secondary | ICD-10-CM | POA: Diagnosis not present

## 2021-12-05 LAB — GLUCOSE, CAPILLARY: Glucose-Capillary: 131 mg/dL — ABNORMAL HIGH (ref 70–99)

## 2021-12-05 MED ORDER — FLUDEOXYGLUCOSE F - 18 (FDG) INJECTION
11.2800 | Freq: Once | INTRAVENOUS | Status: AC | PRN
  Administered 2021-12-05: 11.28 via INTRAVENOUS

## 2021-12-07 ENCOUNTER — Telehealth: Payer: Self-pay | Admitting: Family Medicine

## 2021-12-07 DIAGNOSIS — R918 Other nonspecific abnormal finding of lung field: Secondary | ICD-10-CM

## 2021-12-07 NOTE — Telephone Encounter (Signed)
Please call patient's son.  There is increased tracer intake on the lung lesion.  The concern here is that it could be a slowly growing cancer.  This is not certain since he has not yet had a biopsy.  He does have some increased tracer intake and some of the lymph nodes in the chest so there is a concern that he could have local spread in the chest.  He has some increased tracer uptake in the prostate.  This could be from inflammation or could be from a separate issue, i.e. potentially prostate cancer.  Even if he did have prostate cancer, I think it would be not a great idea to go for biopsy or further work-up right now.  I think the lung issue is likely more significant.  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  I made a note of that in the EMR.  In the meantime I think it makes sense to see the pulmonary clinic about the lung lesion and go from there.  I need their input to see if it would be safe to do a biopsy versus get other options for treatment.  I have the referral pended below.  Please let me know if he consents for the pulmonary referral and I will place that.  Thanks.

## 2021-12-08 NOTE — Telephone Encounter (Signed)
LM for patients son to call back

## 2021-12-08 NOTE — Telephone Encounter (Signed)
Patients son requested results via mychart and I sent below to him; okay per DPR. Will await response.

## 2021-12-09 NOTE — Telephone Encounter (Signed)
LM for patients son to call back

## 2021-12-09 NOTE — Telephone Encounter (Signed)
Jonathan York called back and stated they were okay with the referral. He would like his number to be placed in the referral to call to set up appt. [name] number is [phone].

## 2021-12-10 NOTE — Telephone Encounter (Signed)
I signed the referral and put Phil's contact number in the order.  Thanks.

## 2022-01-01 ENCOUNTER — Ambulatory Visit: Payer: Medicare Other | Admitting: Pulmonary Disease

## 2022-01-01 ENCOUNTER — Encounter: Payer: Self-pay | Admitting: Pulmonary Disease

## 2022-01-01 VITALS — BP 122/70 | HR 71 | Temp 97.5°F | Ht 76.0 in | Wt 213.6 lb

## 2022-01-01 DIAGNOSIS — J432 Centrilobular emphysema: Secondary | ICD-10-CM

## 2022-01-01 DIAGNOSIS — R918 Other nonspecific abnormal finding of lung field: Secondary | ICD-10-CM

## 2022-01-01 MED ORDER — SPIRIVA RESPIMAT 2.5 MCG/ACT IN AERS: 2.0000 | INHALATION_SPRAY | Freq: Every day | RESPIRATORY_TRACT | 0 refills | Status: DC

## 2022-01-01 MED ORDER — SPIRIVA RESPIMAT 2.5 MCG/ACT IN AERS: 2.0000 | INHALATION_SPRAY | Freq: Every day | RESPIRATORY_TRACT | 2 refills | Status: DC

## 2022-01-01 NOTE — Progress Notes (Signed)
Subjective:   PATIENT ID: Jonathan York GENDER: male DOB: [birth year], MRN: [id number]   HPI  Chief Complaint  Patient presents with   Consult    Pt had recent CT and PET scan performed which is the reason for today's visit.  Pt denies any current complaints of cough, SOB, chest discomfort.    Reason for Visit: New consult for PET avid lung mass  Jonathan York is a 88 year old male former smoker with emphysema, history of lung cancer s/p left upper lobe resection in 1990s and chemo, atrial fibrillation, DM2, HTN, carotid disease s/p bilateral endarterectomy, chronic diastolic heart failure, BPH with urinary retention s/p HOLEP who presents as a new consult. He presents with his son who provides additional history.  He reports diagnosis of lung cancer in the 1990s that was treated with chemoradiation and lung resection. Has had no issues since then and has had surveillance imaging by his PCP. Referred after CT and PET demonstrated slowly enlarging right upper lobe lung mass and hypermetabolic lesions in RUL, right hilar and right paratracheal node. At baseline he lives independently but his son is discussing assisted living. He performs ADLs without assistance. Yardwork and other heavier tasks are outsourced. He denies shortness of breath except with moderate exertion and son reports wheezing when he talks. Denies nocturnal respiratory symptoms. Denies cough, weight loss, night sweats, change in appetite or decreased energy.  Social History: Former smoker.  Quit in 1984.  Smoked 1 pack/day x32 years  I have personally reviewed patient's past medical/family/social history, allergies, current medications.  Past Medical History:  Diagnosis Date   Abdominal bruit 05/05/2012   Arthritis    back pain, past lumbar surgery    BPH (benign prostatic hypertrophy)    Carotid arterial disease ([facility])    a. s/p left-sided CEA   Chronic atrial fibrillation ([place])    a. CHADS2VASc => 5 (HTN, age  x 2, DM, vascular disease); Eliquis   COPD (chronic obstructive pulmonary disease) ([facility])    Diabetes mellitus type II    diet controlled    Dyspnea    HLD (hyperlipidemia)    HTN (hypertension)    Lung cancer ([facility])    a. s/p resection and chemo in 2002   TIA (transient ischemic attack) 2019     Family History  Problem Relation Age of Onset   Emphysema Father    Dementia Mother    Diabetes Mother    Hypertension Mother    Hypothyroidism Sister    Dementia Brother    Hypertension Brother    Diabetes Son    Heart disease Son        Heart Disease before age 25- Open Heart 2010   Hypertension Son    Stroke Son        while on coumadin   Prostate cancer Neg Hx    Colon cancer Neg Hx      Social History   Occupational History   Occupation: retired-[employer]  Tobacco Use   Smoking status: Former    Packs/day: 1.00    Years: 32.00    Total pack years: 32.00    Types: Cigarettes    Quit date: 09/03/1982    Years since quitting: 39.3   Smokeless tobacco: Never  Vaping Use   Vaping Use: Never used  Substance and Sexual Activity   Alcohol use: No    Alcohol/week: 0.0 standard drinks of alcohol    Comment:     Drug use: No  Sexual activity: Yes    Allergies  Allergen Reactions   Xarelto [Rivaroxaban] Other (See Comments)    vertigo     Outpatient Medications Prior to Visit  Medication Sig Dispense Refill   amLODipine (NORVASC) 5 MG tablet Take 1 tablet (5 mg total) by mouth daily. 90 tablet 3   apixaban (ELIQUIS) 5 MG TABS tablet Take 1 tablet (5 mg total) by mouth 2 (two) times daily. 180 tablet 3   atorvastatin (LIPITOR) 80 MG tablet Take 1 tablet (80 mg total) by mouth daily. 90 tablet 3   cyanocobalamin (,VITAMIN B-12,) 1000 MCG/ML injection 1000 mcg IM monthly. 1 mL    donepezil (ARICEPT) 10 MG tablet 5 mg (half tab) taken at night for 1 month then 10 mg (1 tab) at night. 90 tablet 1   metFORMIN (GLUCOPHAGE) 500 MG tablet Take 1 tablet (500 mg  total) by mouth 2 (two) times daily with a meal. 180 tablet 3   No facility-administered medications prior to visit.    Review of Systems  Constitutional:  Negative for chills, diaphoresis, fever, malaise/fatigue and weight loss.  HENT:  Negative for congestion.   Respiratory:  Positive for wheezing. Negative for cough, hemoptysis, sputum production and shortness of breath.   Cardiovascular:  Negative for chest pain, palpitations and leg swelling.     Objective:   Vitals:   01/01/22 0858  BP: 122/70  Pulse: 71  Temp: (!) 97.5 F (36.4 C)  TempSrc: Oral  SpO2: 95%  Weight: 213 lb 9.6 oz (96.9 kg)  Height: 6\' 4"  (1.93 m)   SpO2: 95 % (RA) O2 Device: None (Room air)  Physical Exam: General: Elderly, well-appearing, no acute distress HENT: [place], AT Eyes: EOMI, no scleral icterus Respiratory: Diminished breath sounds bilaterally.  No crackles, wheezing or rales Cardiovascular: RRR, -M/R/G, no JVD Extremities:-Edema,-tenderness Neuro: AAO x4, CNII-XII grossly intact Psych: Normal mood, normal affect  Data Reviewed:  Imaging: PET CT 03/04/2018-right apical lung lesions are not hypermetabolic CT chest 1/66/0630-ZSWFUX irregular mass in the right upper lobe with larger measuring 3.7 x 2.6 cm.  Emphysema. S/p left lower lobe lobectomy CT chest 09/29/2019-stable irregular partly solid upper lobe mass measuring 5.7 cm with solid 3.8 cm component. Right middle lobe groundglass nodule measuring 6 mm, unchanged CT chest 10/25/2021 subsolid right upper lobe lung mass enlarging and measuring 4 x 2.4 x 2.5 along with microlobulated and slightly spiculated margins PET/CT 12/07/2021-subpleural soft tissue mass measuring 6.2x2.7 with SUV max 4.91.  Triangular-shaped area of thickening measuring 1.6x1.1 along the oblique fissure of the left lung with mild FDG uptake of SUV max 2.47, present since 02/24/2018 and unchanged.  Right paratracheal lymph node measuring 1.2 cm SUV 4.36.  Right hilar lymph  node with SUV max 6.77.  Background emphysema.  Incidental benign right adrenal adenoma asymmetric radiotracer activity of the left prostate SUV max 5.29  PFT: None on file  Labs: CBC    Component Value Date/Time   WBC 8.5 10/17/2021 1320   RBC 4.72 10/17/2021 1320   HGB 14.1 10/17/2021 1320   HGB 13.5 08/18/2019 0932   HGB 13.4 11/22/2012 1420   HCT 41.7 10/17/2021 1320   HCT 41.0 08/18/2019 0932   HCT 40.2 11/22/2012 1420   PLT 227.0 10/17/2021 1320   PLT 248 08/18/2019 0932   MCV 88.5 10/17/2021 1320   MCV 88 08/18/2019 0932   MCV 87.2 11/22/2012 1420   MCH 28.8 08/18/2019 0932   MCH 29.1 08/11/2019 0400   MCHC 33.8  10/17/2021 1320   RDW 14.5 10/17/2021 1320   RDW 13.2 08/18/2019 0932   RDW 14.1 11/22/2012 1420   LYMPHSABS 1.8 10/17/2021 1320   LYMPHSABS 1.7 11/22/2012 1420   MONOABS 0.8 10/17/2021 1320   MONOABS 0.7 11/22/2012 1420   EOSABS 0.1 10/17/2021 1320   EOSABS 0.1 11/22/2012 1420   BASOSABS 0.1 10/17/2021 1320   BASOSABS 0.1 11/22/2012 1420   BMET    Component Value Date/Time   NA 139 10/17/2021 1320   NA 139 11/04/2015 1021   NA 140 11/22/2012 1420   K 4.7 10/17/2021 1320   K 4.5 11/22/2012 1420   CL 101 10/17/2021 1320   CL 105 11/22/2012 1420   CO2 31 10/17/2021 1320   CO2 26 11/22/2012 1420   GLUCOSE 149 (H) 10/17/2021 1320   GLUCOSE 135 (H) 11/22/2012 1420   BUN 14 10/17/2021 1320   BUN 13 11/04/2015 1021   BUN 13.9 11/22/2012 1420   CREATININE 1.11 10/17/2021 1320   CREATININE 0.9 11/22/2012 1420   CALCIUM 9.5 10/17/2021 1320   CALCIUM 8.9 11/22/2012 1420   GFRNONAA >60 08/11/2019 0400   Normal WBC, BMET except for hyperglycemia     Assessment & Plan:   Discussion: 88 year old male former smoker with emphysema, history of lung cancer s/p left upper lobe resection in 2002 and chemo, atrial fibrillation, DM2, HTN, carotid disease s/p bilateral endarterectomy, chronic diastolic heart failure, BPH with urinary retention s/p HOLEP  who  presents as a new consult  Based on imaging, lung mass is highly concerning for malignancy in setting of enlarging PET-avid lung mass with hilar and mediastinal involvement in a patient with prior history of lung cancer.  We discussed diagnostic testing including CT-guided biopsy, bronchoscopy and surgery vs conservative management with further imaging. We also discussed potential treatment with chemotherapy, radiation and IT. We would need tissue diagnosis and evaluation by Oncology for a specific plan.  After addressing patient/family's questions, patient wishes to pursue surveillance imaging.  He has undergone treatment in the past but at his age he does not believe he would tolerate repeat treatment. I addressed questions from patient and son. I did offer to schedule biopsy with [facility] in the future if he changes his mind before our next appointment.  Right lung mass concerning for malignancy --ORDER CT Chest without contrast in August 2023 --If change your mind and want pursue biopsy after this visit, please contact our office and we can arrange  Emphysema --START Spiriva 2.5 mcg TWO puffs ONCE a day --Inhaler teaching provided   Health Maintenance Immunization History  Administered Date(s) Administered   Fluad Quad(high Dose 65+) 04/18/2019, 05/09/2020, 04/11/2021   Influenza Split 05/27/2011, 04/27/2012   Influenza Whole 05/20/2004, 04/20/2007, 04/09/2008, 04/30/2009, 04/08/2010   Influenza, High Dose Seasonal PF 05/12/2018   Influenza,inj,Quad PF,6+ Mos 03/29/2013, 04/05/2014, 04/26/2015, 04/24/2016, 05/04/2017   PFIZER(Purple Top)SARS-COV-2 Vaccination 09/02/2019, 09/23/2019, 08/01/2020, 06/27/2021   Pneumococcal Conjugate-13 02/11/2015   Pneumococcal Polysaccharide-23 03/09/2002, 03/03/2008   Td 09/14/2002, 02/14/2016   Zoster, Live 01/02/2010, 01/04/2010   CT Lung Screen - not qualified. Prior lung cancer history and treatment  No orders of the defined types were placed in  this encounter.  Meds ordered this encounter  Medications   Tiotropium Bromide Monohydrate (SPIRIVA RESPIMAT) 2.5 MCG/ACT AERS    Sig: Inhale 2 puffs into the lungs daily.    Dispense:  4 g    Refill:  2   Tiotropium Bromide Monohydrate (SPIRIVA RESPIMAT) 2.5 MCG/ACT AERS  Sig: Inhale 2 puffs into the lungs daily.    Dispense:  4 g    Refill:  0    Order Specific Question:   Lot Number?    Answer:   [id number]    Order Specific Question:   Expiration Date?    Answer:   08/20/2022    Order Specific Question:   Quantity    Answer:   2    Return in about 2 months (around 03/03/2022).  I have spent a total time of 60-minutes on the day of the appointment reviewing prior documentation, coordinating care and discussing medical diagnosis and plan with the patient/family. Imaging, labs and tests included in this note have been reviewed and interpreted independently by me.  [place], MD [facility] 01/01/2022 9:33 AM  Office Number [phone]

## 2022-01-01 NOTE — Addendum Note (Signed)
Addended by: Lorretta Harp on: 01/01/2022 09:48 AM   Modules accepted: Orders

## 2022-01-01 NOTE — Patient Instructions (Signed)
Right lung mass concerning for malignancy --ORDER CT Chest without contrast in August 2023 --If change your mind and want pursue biopsy after this visit, please contact our office and we can arrange  Emphysema --START Spiriva 2.5 mcg TWO puffs ONCE a day --Inhaler teaching provided  Follow-up with me in August to discuss CT results

## 2022-01-26 ENCOUNTER — Telehealth: Payer: Self-pay | Admitting: Pulmonary Disease

## 2022-01-26 NOTE — Telephone Encounter (Signed)
Called patient and he states he is wanting to get scheduled to do the biopsy. And wants to get it done ASAP.   Please advise ladies

## 2022-01-26 NOTE — Telephone Encounter (Signed)
Order has not been placed for this.  Will need info.

## 2022-01-27 NOTE — Telephone Encounter (Signed)
Called patient to inform him that Dr Loanne Drilling will be back in the office this week. And that he should expect a call sometime this week to get him scheduled for his bronchoscopy. Patient verbalized understanding. Nothing further needed

## 2022-01-27 NOTE — Telephone Encounter (Signed)
Dr. Loanne Drilling will be in the office later this week so I will let her get the patient schedule for a bronchoscopy - please let him know we'll reach out to him towards the end of the week.

## 2022-01-29 NOTE — Telephone Encounter (Signed)
Please schedule patient for telephone visit at 4 pm tomorrow to discuss bronchoscopy.  Please let them know that I may call earlier in the day if I have availability

## 2022-01-29 NOTE — Telephone Encounter (Signed)
Please add patient to my schedule for 4 pm on 01/30/22

## 2022-01-29 NOTE — Telephone Encounter (Signed)
Called pt, per Dr Cordelia Pen request, to confirm that he was available tomorrow, 01-30-22, at 4:00pm to discuss results of bronchoscopy.  Pt said he would be available for phone visit.

## 2022-01-30 ENCOUNTER — Ambulatory Visit (INDEPENDENT_AMBULATORY_CARE_PROVIDER_SITE_OTHER): Payer: Medicare Other | Admitting: Pulmonary Disease

## 2022-01-30 ENCOUNTER — Encounter: Payer: Self-pay | Admitting: Pulmonary Disease

## 2022-01-30 DIAGNOSIS — R918 Other nonspecific abnormal finding of lung field: Secondary | ICD-10-CM | POA: Diagnosis not present

## 2022-01-30 NOTE — Progress Notes (Signed)
Virtual Visit via Telephone Note  I connected with Jonathan York on 01/30/22 at  4:00 PM EDT by telephone and verified that I am speaking with the correct person using two identifiers.  Location: Patient: Home Provider: [facility]   I discussed the limitations, risks, security and privacy concerns of performing an evaluation and management service by telephone and the availability of in person appointments. I also discussed with the patient that there may be a patient responsible charge related to this service. The patient expressed understanding and agreed to proceed.   I discussed the assessment and treatment plan with the patient. The patient was provided an opportunity to ask questions and all were answered. The patient agreed with the plan and demonstrated an understanding of the instructions.   The patient was advised to call back or seek an in-person evaluation if the symptoms worsen or if the condition fails to improve as anticipated.  I provided 35 minutes of non-face-to-face time during this encounter.   Kristyanna Barcelo Rodman Pickle, MD  Subjective:   PATIENT ID: Jonathan York GENDER: male DOB: Sep 29, 1932, MRN: [id number]   HPI  Chief Complaint  Patient presents with   Follow-up    Phone call initially wanting to schedule bronch Son had additional questions on bronch    Reason for Visit: Follow-up  Jonathan York is a 88 year old male former smoker with emphysema, history of lung cancer s/p left upper lobe resection in 1990s and chemo, atrial fibrillation, DM2, HTN, carotid disease s/p bilateral endarterectomy, chronic diastolic heart failure, BPH with urinary retention s/p HOLEP who presents for telephone phone follow-up. Son present on the line with patient to provide additional history.  Initial consult He reports diagnosis of lung cancer in the 1990s that was treated with chemoradiation and lung resection. Has had no issues since then and has had surveillance  imaging by his PCP. Referred after CT and PET demonstrated slowly enlarging right upper lobe lung mass and hypermetabolic lesions in RUL, right hilar and right paratracheal node. At baseline he lives independently but his son is discussing assisted living. He performs ADLs without assistance. Yardwork and other heavier tasks are outsourced. He denies shortness of breath except with moderate exertion and son reports wheezing when he talks. Denies nocturnal respiratory symptoms. Denies cough, weight loss, night sweats, change in appetite or decreased energy.  01/30/22 Office received phone call regarding scheduling bronchoscopy as patient wished to pursue procedure. However on day of visit, patient hung up on me twice and once was able to have son and patient on line, patient expressed indecisiveness regarding want bronchoscopy. Questions regarding procedure and treatment were asked.  Social History: Former smoker.  Quit in 1984.  Smoked 1 pack/day x32 years  Past Medical History:  Diagnosis Date   Abdominal bruit 05/05/2012   Arthritis    back pain, past lumbar surgery    BPH (benign prostatic hypertrophy)    Carotid arterial disease ([place])    a. s/p left-sided CEA   Chronic atrial fibrillation ([place])    a. CHADS2VASc => 5 (HTN, age x 2, DM, vascular disease); Eliquis   COPD (chronic obstructive pulmonary disease) (HCC)    Diabetes mellitus type II    diet controlled    Dyspnea    HLD (hyperlipidemia)    HTN (hypertension)    Lung cancer ([place])    a. s/p resection and chemo in 2002   TIA (transient ischemic attack) 2019     Family History  Problem Relation  Age of Onset   Emphysema Father    Dementia Mother    Diabetes Mother    Hypertension Mother    Hypothyroidism Sister    Dementia Brother    Hypertension Brother    Diabetes Son    Heart disease Son        Heart Disease before age 27- Open Heart 2010   Hypertension Son    Stroke Son        while on coumadin   Prostate cancer  Neg Hx    Colon cancer Neg Hx      Social History   Occupational History   Occupation: retired-[employer]  Tobacco Use   Smoking status: Former    Packs/day: 1.00    Years: 32.00    Total pack years: 32.00    Types: Cigarettes    Quit date: 09/03/1982    Years since quitting: 39.4   Smokeless tobacco: Never  Vaping Use   Vaping Use: Never used  Substance and Sexual Activity   Alcohol use: No    Alcohol/week: 0.0 standard drinks of alcohol    Comment:     Drug use: No   Sexual activity: Yes    Allergies  Allergen Reactions   Xarelto [Rivaroxaban] Other (See Comments)    vertigo     Outpatient Medications Prior to Visit  Medication Sig Dispense Refill   amLODipine (NORVASC) 5 MG tablet Take 1 tablet (5 mg total) by mouth daily. 90 tablet 3   apixaban (ELIQUIS) 5 MG TABS tablet Take 1 tablet (5 mg total) by mouth 2 (two) times daily. 180 tablet 3   atorvastatin (LIPITOR) 80 MG tablet Take 1 tablet (80 mg total) by mouth daily. 90 tablet 3   cyanocobalamin (,VITAMIN B-12,) 1000 MCG/ML injection 1000 mcg IM monthly. 1 mL    donepezil (ARICEPT) 10 MG tablet 5 mg (half tab) taken at night for 1 month then 10 mg (1 tab) at night. 90 tablet 1   metFORMIN (GLUCOPHAGE) 500 MG tablet Take 1 tablet (500 mg total) by mouth 2 (two) times daily with a meal. 180 tablet 3   Tiotropium Bromide Monohydrate (SPIRIVA RESPIMAT) 2.5 MCG/ACT AERS Inhale 2 puffs into the lungs daily. 4 g 2   Tiotropium Bromide Monohydrate (SPIRIVA RESPIMAT) 2.5 MCG/ACT AERS Inhale 2 puffs into the lungs daily. 4 g 0   No facility-administered medications prior to visit.    Review of Systems  Constitutional:  Negative for chills, diaphoresis, fever, malaise/fatigue and weight loss.  HENT:  Negative for congestion.   Respiratory:  Negative for cough, hemoptysis, sputum production, shortness of breath and wheezing.   Cardiovascular:  Negative for chest pain, palpitations and leg swelling.      Objective:   There were no vitals filed for this visit.     Physical Exam: General: No acute distress  Data Reviewed:  Imaging: PET CT 03/04/2018-right apical lung lesions are not hypermetabolic CT chest 1/61/0960-AVWUJW irregular mass in the right upper lobe with larger measuring 3.7 x 2.6 cm.  Emphysema. S/p left lower lobe lobectomy CT chest 09/29/2019-stable irregular partly solid upper lobe mass measuring 5.7 cm with solid 3.8 cm component. Right middle lobe groundglass nodule measuring 6 mm, unchanged CT chest 10/25/2021 subsolid right upper lobe lung mass enlarging and measuring 4 x 2.4 x 2.5 along with microlobulated and slightly spiculated margins PET/CT 12/07/2021-subpleural soft tissue mass measuring 6.2x2.7 with SUV max 4.91.  Triangular-shaped area of thickening measuring 1.6x1.1 along the oblique fissure  of the left lung with mild FDG uptake of SUV max 2.47, present since 02/24/2018 and unchanged.  Right paratracheal lymph node measuring 1.2 cm SUV 4.36.  Right hilar lymph node with SUV max 6.77.  Background emphysema.  Incidental benign right adrenal adenoma asymmetric radiotracer activity of the left prostate SUV max 5.29  PFT: None on file  Labs: CBC    Component Value Date/Time   WBC 8.5 10/17/2021 1320   RBC 4.72 10/17/2021 1320   HGB 14.1 10/17/2021 1320   HGB 13.5 08/18/2019 0932   HGB 13.4 11/22/2012 1420   HCT 41.7 10/17/2021 1320   HCT 41.0 08/18/2019 0932   HCT 40.2 11/22/2012 1420   PLT 227.0 10/17/2021 1320   PLT 248 08/18/2019 0932   MCV 88.5 10/17/2021 1320   MCV 88 08/18/2019 0932   MCV 87.2 11/22/2012 1420   MCH 28.8 08/18/2019 0932   MCH 29.1 08/11/2019 0400   MCHC 33.8 10/17/2021 1320   RDW 14.5 10/17/2021 1320   RDW 13.2 08/18/2019 0932   RDW 14.1 11/22/2012 1420   LYMPHSABS 1.8 10/17/2021 1320   LYMPHSABS 1.7 11/22/2012 1420   MONOABS 0.8 10/17/2021 1320   MONOABS 0.7 11/22/2012 1420   EOSABS 0.1 10/17/2021 1320   EOSABS 0.1  11/22/2012 1420   BASOSABS 0.1 10/17/2021 1320   BASOSABS 0.1 11/22/2012 1420   BMET    Component Value Date/Time   NA 139 10/17/2021 1320   NA 139 11/04/2015 1021   NA 140 11/22/2012 1420   K 4.7 10/17/2021 1320   K 4.5 11/22/2012 1420   CL 101 10/17/2021 1320   CL 105 11/22/2012 1420   CO2 31 10/17/2021 1320   CO2 26 11/22/2012 1420   GLUCOSE 149 (H) 10/17/2021 1320   GLUCOSE 135 (H) 11/22/2012 1420   BUN 14 10/17/2021 1320   BUN 13 11/04/2015 1021   BUN 13.9 11/22/2012 1420   CREATININE 1.11 10/17/2021 1320   CREATININE 0.9 11/22/2012 1420   CALCIUM 9.5 10/17/2021 1320   CALCIUM 8.9 11/22/2012 1420   GFRNONAA >60 08/11/2019 0400   Normal WBC, BMET except for hyperglycemia     Assessment & Plan:   Discussion: 88 year old male former smoker with emphysema, hx of lung cancer s/p LUL resection in 2002 and chemo, atrial fibrillation, DM2, HTN, carotid disease s/p bilateral endarterectomy, chronic diastolic heart failure, BPH with urinary retention s/p HOLEP who presents for follow-up.  Based on imaging, lung mass is highly concerning for malignancy in setting of enlarging PET-avid lung mass with hilar and mediastinal involvement in a patient with prior history of lung cancer.  We again discussed diagnostic testing via bronchoscopy vs conservative management with further imaging. We discussed potential treatment with chemotherapy, radiation and IT. If we wish to pursue treatment then would need tissue confirmation however if patient would not want treatment, recommended against diagnostic testing.  I addressed patient and son's questions and patient wishes to pursue surveillance imaging as he is unsure if he wants any treatment given his age and preference to maintain quality of life. He has undergone treatment in the past for cancer but at his age he does not believe he would tolerate repeat treatment.   Right lung mass concerning for malignancy --Continue with plan for CT Chest  without contrast in August 2023 --If change your mind and want pursue biopsy after this visit, please contact our office and we can arrange  Emphysema --Continue Spiriva 2.5 mcg TWO puffs ONCE a day  Health Maintenance Immunization History  Administered Date(s) Administered   Fluad Quad(high Dose 65+) 04/18/2019, 05/09/2020, 04/11/2021   Influenza Split 05/27/2011, 04/27/2012   Influenza Whole 05/20/2004, 04/20/2007, 04/09/2008, 04/30/2009, 04/08/2010   Influenza, High Dose Seasonal PF 05/12/2018   Influenza,inj,Quad PF,6+ Mos 03/29/2013, 04/05/2014, 04/26/2015, 04/24/2016, 05/04/2017   PFIZER(Purple Top)SARS-COV-2 Vaccination 09/02/2019, 09/23/2019, 08/01/2020, 06/27/2021   Pneumococcal Conjugate-13 02/11/2015   Pneumococcal Polysaccharide-23 03/09/2002, 03/03/2008   Td 09/14/2002, 02/14/2016   Zoster, Live 01/02/2010, 01/04/2010   CT Lung Screen - not qualified. Prior lung cancer history and treatment  No orders of the defined types were placed in this encounter.  No orders of the defined types were placed in this encounter.   No follow-ups on file.  I have spent a total time of 35-minutes on the day of the appointment including chart review, data review, collecting history, coordinating care and discussing medical diagnosis and plan with the patient/family. Past medical history, allergies, medications were reviewed. Pertinent imaging, labs and tests included in this note have been reviewed and interpreted independently by me.  [place], MD [facility] 01/30/2022 5:11 PM  Office Number [phone]

## 2022-01-30 NOTE — Telephone Encounter (Signed)
Patient has been added to Dr. Rosario Adie schedule for today at 4 pm. Nothing further needed at this time.

## 2022-02-05 ENCOUNTER — Other Ambulatory Visit: Payer: Self-pay | Admitting: Family Medicine

## 2022-03-09 ENCOUNTER — Ambulatory Visit
Admission: RE | Admit: 2022-03-09 | Discharge: 2022-03-09 | Disposition: A | Discharge status: Home / Self Care | Payer: Medicare Other | Source: Ambulatory Visit | Attending: Pulmonary Disease | Admitting: Pulmonary Disease

## 2022-03-09 DIAGNOSIS — R918 Other nonspecific abnormal finding of lung field: Secondary | ICD-10-CM | POA: Insufficient documentation

## 2022-03-09 DIAGNOSIS — R599 Enlarged lymph nodes, unspecified: Secondary | ICD-10-CM | POA: Diagnosis not present

## 2022-03-16 ENCOUNTER — Encounter: Payer: Self-pay | Admitting: Pulmonary Disease

## 2022-03-16 ENCOUNTER — Ambulatory Visit: Payer: Medicare Other | Admitting: Pulmonary Disease

## 2022-03-16 VITALS — BP 158/90 | HR 66 | Ht 76.0 in | Wt 213.2 lb

## 2022-03-16 DIAGNOSIS — J432 Centrilobular emphysema: Secondary | ICD-10-CM

## 2022-03-16 DIAGNOSIS — R918 Other nonspecific abnormal finding of lung field: Secondary | ICD-10-CM

## 2022-03-16 MED ORDER — SPIRIVA RESPIMAT 2.5 MCG/ACT IN AERS: 2.0000 | INHALATION_SPRAY | Freq: Every day | RESPIRATORY_TRACT | 11 refills | Status: DC

## 2022-03-16 NOTE — Progress Notes (Signed)
Subjective:   PATIENT ID: Jonathan York GENDER: male DOB: October 16, 1932, MRN: [id number]   HPI  Chief Complaint  Patient presents with   Follow-up    CT results    Reason for Visit: Follow-up  Jonathan York is a 88 year old male former smoker with emphysema, history of lung cancer s/p left upper lobe resection in 1990s and chemo, atrial fibrillation, DM2, HTN, carotid disease s/p bilateral endarterectomy, chronic diastolic heart failure, BPH with urinary retention s/p HOLEP who presents for follow-up.   Initial consult He reports diagnosis of lung cancer in the 1990s that was treated with chemoradiation and lung resection. Has had no issues since then and has had surveillance imaging by his PCP. Referred after CT and PET demonstrated slowly enlarging right upper lobe lung mass and hypermetabolic lesions in RUL, right hilar and right paratracheal node. At baseline he lives independently but his son is discussing assisted living. He performs ADLs without assistance. Yardwork and other heavier tasks are outsourced. He denies shortness of breath except with moderate exertion and son reports wheezing when he talks. Denies nocturnal respiratory symptoms. Denies cough, weight loss, night sweats, change in appetite or decreased energy.  01/30/22 Office received phone call regarding scheduling bronchoscopy as patient wished to pursue procedure. However on day of visit, patient hung up on me twice and once was able to have son and patient on line, patient expressed indecisiveness regarding want bronchoscopy. Questions regarding procedure and treatment were asked.  03/16/22 Son present on visit. Reports improved symptoms on Spiriva including improving shortness of breath and wheezing. Improved stamina with walking. Planning to move to [place] with son to ALF.   Social History: Former smoker.  Quit in 1984.  Smoked 1 pack/day x32 years  Past Medical History:  Diagnosis Date   Abdominal bruit  05/05/2012   Arthritis    back pain, past lumbar surgery    BPH (benign prostatic hypertrophy)    Carotid arterial disease ([place])    a. s/p left-sided CEA   Chronic atrial fibrillation ([place])    a. CHADS2VASc => 5 (HTN, age x 2, DM, vascular disease); Eliquis   COPD (chronic obstructive pulmonary disease) (HCC)    Diabetes mellitus type II    diet controlled    Dyspnea    HLD (hyperlipidemia)    HTN (hypertension)    Lung cancer (HCC)    a. s/p resection and chemo in 2002   TIA (transient ischemic attack) 2019     Family History  Problem Relation Age of Onset   Emphysema Father    Dementia Mother    Diabetes Mother    Hypertension Mother    Hypothyroidism Sister    Dementia Brother    Hypertension Brother    Diabetes Son    Heart disease Son        Heart Disease before age 54- Open Heart 2010   Hypertension Son    Stroke Son        while on coumadin   Prostate cancer Neg Hx    Colon cancer Neg Hx      Social History   Occupational History   Occupation: retired-[employer]  Tobacco Use   Smoking status: Former    Packs/day: 1.00    Years: 32.00    Total pack years: 32.00    Types: Cigarettes    Quit date: 09/03/1982    Years since quitting: 39.5   Smokeless tobacco: Never  Vaping Use   Vaping Use: Never  used  Substance and Sexual Activity   Alcohol use: No    Alcohol/week: 0.0 standard drinks of alcohol    Comment:     Drug use: No   Sexual activity: Yes    Allergies  Allergen Reactions   Xarelto [Rivaroxaban] Other (See Comments)    vertigo     Outpatient Medications Prior to Visit  Medication Sig Dispense Refill   amLODipine (NORVASC) 5 MG tablet Take 1 tablet (5 mg total) by mouth daily. 90 tablet 3   atorvastatin (LIPITOR) 80 MG tablet Take 1 tablet by mouth once daily 90 tablet 0   cyanocobalamin (,VITAMIN B-12,) 1000 MCG/ML injection 1000 mcg IM monthly. 1 mL    donepezil (ARICEPT) 10 MG tablet 5 mg (half tab) taken at night for 1  month then 10 mg (1 tab) at night. 90 tablet 1   ELIQUIS 5 MG TABS tablet Take 1 tablet by mouth twice daily 180 tablet 0   metFORMIN (GLUCOPHAGE) 500 MG tablet TAKE 1 TABLET BY MOUTH TWICE DAILY WITH A MEAL 180 tablet 0   Tiotropium Bromide Monohydrate (SPIRIVA RESPIMAT) 2.5 MCG/ACT AERS Inhale 2 puffs into the lungs daily. 4 g 2   Tiotropium Bromide Monohydrate (SPIRIVA RESPIMAT) 2.5 MCG/ACT AERS Inhale 2 puffs into the lungs daily. 4 g 0   No facility-administered medications prior to visit.    Review of Systems  Constitutional:  Negative for chills, diaphoresis, fever, malaise/fatigue and weight loss.  HENT:  Negative for congestion.   Respiratory:  Negative for cough, hemoptysis, sputum production, shortness of breath and wheezing.   Cardiovascular:  Negative for chest pain, palpitations and leg swelling.     Objective:   Vitals:   03/16/22 0909  BP: (!) 158/90  Pulse: 66  SpO2: 96%  Weight: 213 lb 3.2 oz (96.7 kg)  Height: 6\' 4"  (1.93 m)    SpO2: 96 % O2 Device: None (Room air)  Physical Exam: General: Well-appearing, no acute distress HENT: [place], AT Eyes: EOMI, no scleral icterus Respiratory: Clear to auscultation bilaterally.  No crackles, wheezing or rales Cardiovascular: RRR, -M/R/G, no JVD Extremities:-Edema,-tenderness Neuro: AAO x4, CNII-XII grossly intact Psych: Normal mood, normal affect  Data Reviewed:  Imaging: PET CT 03/04/2018-right apical lung lesions are not hypermetabolic CT chest 5/42/7062-BJSEGB irregular mass in the right upper lobe with larger measuring 3.7 x 2.6 cm.  Emphysema. S/p left lower lobe lobectomy CT chest 09/29/2019-stable irregular partly solid upper lobe mass measuring 5.7 cm with solid 3.8 cm component. Right middle lobe groundglass nodule measuring 6 mm, unchanged CT chest 10/25/2021 subsolid right upper lobe lung mass enlarging and measuring 4 x 2.4 x 2.5 along with microlobulated and slightly spiculated margins PET/CT  12/07/2021-subpleural soft tissue mass measuring 6.2x2.7 with SUV max 4.91.  Triangular-shaped area of thickening measuring 1.6x1.1 along the oblique fissure of the left lung with mild FDG uptake of SUV max 2.47, present since 02/24/2018 and unchanged.  Right paratracheal lymph node measuring 1.2 cm SUV 4.36.  Right hilar lymph node with SUV max 6.77.  Background emphysema.  Incidental benign right adrenal adenoma asymmetric radiotracer activity of the left prostate SUV max 5.29 CT chest 03/09/2022-right upper lobe lung mass measuring 3.9 x 2 cm and enlarged mediastinal lymph nodes including precarinal 1.5 x 1.5 cm  PFT: None on file  Labs: CBC    Component Value Date/Time   WBC 8.5 10/17/2021 1320   RBC 4.72 10/17/2021 1320   HGB 14.1 10/17/2021 1320   HGB 13.5 08/18/2019  0932   HGB 13.4 11/22/2012 1420   HCT 41.7 10/17/2021 1320   HCT 41.0 08/18/2019 0932   HCT 40.2 11/22/2012 1420   PLT 227.0 10/17/2021 1320   PLT 248 08/18/2019 0932   MCV 88.5 10/17/2021 1320   MCV 88 08/18/2019 0932   MCV 87.2 11/22/2012 1420   MCH 28.8 08/18/2019 0932   MCH 29.1 08/11/2019 0400   MCHC 33.8 10/17/2021 1320   RDW 14.5 10/17/2021 1320   RDW 13.2 08/18/2019 0932   RDW 14.1 11/22/2012 1420   LYMPHSABS 1.8 10/17/2021 1320   LYMPHSABS 1.7 11/22/2012 1420   MONOABS 0.8 10/17/2021 1320   MONOABS 0.7 11/22/2012 1420   EOSABS 0.1 10/17/2021 1320   EOSABS 0.1 11/22/2012 1420   BASOSABS 0.1 10/17/2021 1320   BASOSABS 0.1 11/22/2012 1420     Assessment & Plan:   Discussion: 88 year old male former smoker with emphysema, hx of lung cancer s/p LUL resection in 2002 and chemo, atrial fibrillation, DM2, HTN, carotid disease/P bilateral endarterectomy, chronic diastolic heart failure, BPH with urinary retention s/p HOLEP who presents for follow-up.  Recent CT reviewed which again demonstrates right upper lobe lung mass concerning for malignancy with prior PET with hypermetabolic lung mass and mediastinal,  hilar lymph nodes. We again discussed diagnostic testing including  bronchoscopy vs surveillance imaging vs palliative measures. After addressing patient/family's questions, patient declined any further testing or imaging as he has previously undergone treatment for cancer and would like to focus on quality of life at his age.   Right lung mass concerning for malignancy --Discussed goals of care and will hold off on any future imaging --If you would like a referral to Oncology in the future, please contact us. However he is aware that they may want a biopsy  Emphysema --Continue Spiriva 2.5 mcg TWO puffs ONCE a day. REFILL  Patient will plan to establish care in [place]. Follow-up as needed  Health Maintenance Immunization History  Administered Date(s) Administered   Fluad Quad(high Dose 65+) 04/18/2019, 05/09/2020, 04/11/2021   Influenza Split 05/27/2011, 04/27/2012   Influenza Whole 05/20/2004, 04/20/2007, 04/09/2008, 04/30/2009, 04/08/2010   Influenza, High Dose Seasonal PF 05/12/2018   Influenza,inj,Quad PF,6+ Mos 03/29/2013, 04/05/2014, 04/26/2015, 04/24/2016, 05/04/2017   PFIZER(Purple Top)SARS-COV-2 Vaccination 09/02/2019, 09/23/2019, 08/01/2020, 06/27/2021   Pneumococcal Conjugate-13 02/11/2015   Pneumococcal Polysaccharide-23 03/09/2002, 03/03/2008   Td 09/14/2002, 02/14/2016   Zoster, Live 01/02/2010, 01/04/2010   CT Lung Screen - not qualified. Prior lung cancer history and treatment  No orders of the defined types were placed in this encounter.  Meds ordered this encounter  Medications   Tiotropium Bromide Monohydrate (SPIRIVA RESPIMAT) 2.5 MCG/ACT AERS    Sig: Inhale 2 puffs into the lungs daily.    Dispense:  4 g    Refill:  11    Return if symptoms worsen or fail to improve.  I have spent a total time of 33-minutes on the day of the appointment including chart review, data review, collecting history, coordinating care and discussing medical diagnosis and plan  with the patient/family. Past medical history, allergies, medications were reviewed. Pertinent imaging, labs and tests included in this note have been reviewed and interpreted independently by me.  [place], MD [facility] 03/16/2022  Office Number [phone]

## 2022-03-16 NOTE — Patient Instructions (Addendum)
Right lung mass concerning for malignancy --Discussed goals of care and will hold off on any future imaging --If you would like a referral to Oncology in the future, please contact us. However he is aware that they may want a biopsy  Emphysema --Continue Spiriva 2.5 mcg TWO puffs ONCE a day. REFILL  Follow-up with me as needed

## 2022-03-25 ENCOUNTER — Encounter: Payer: Self-pay | Admitting: Family Medicine

## 2022-03-26 NOTE — Telephone Encounter (Signed)
Patient son came in office and dropped off FL2 paperwork for PCP to fill out and has been placed in PCP folder.

## 2022-04-23 ENCOUNTER — Ambulatory Visit (INDEPENDENT_AMBULATORY_CARE_PROVIDER_SITE_OTHER): Payer: Medicare Other | Admitting: Family Medicine

## 2022-04-23 ENCOUNTER — Encounter: Payer: Self-pay | Admitting: Family Medicine

## 2022-04-23 VITALS — BP 136/80 | HR 84 | Temp 97.0°F | Ht 76.0 in | Wt 205.0 lb

## 2022-04-23 DIAGNOSIS — R399 Unspecified symptoms and signs involving the genitourinary system: Secondary | ICD-10-CM

## 2022-04-23 DIAGNOSIS — Z23 Encounter for immunization: Secondary | ICD-10-CM

## 2022-04-23 DIAGNOSIS — R918 Other nonspecific abnormal finding of lung field: Secondary | ICD-10-CM

## 2022-04-23 DIAGNOSIS — E119 Type 2 diabetes mellitus without complications: Secondary | ICD-10-CM | POA: Diagnosis not present

## 2022-04-23 DIAGNOSIS — E538 Deficiency of other specified B group vitamins: Secondary | ICD-10-CM | POA: Diagnosis not present

## 2022-04-23 LAB — CBC WITH DIFFERENTIAL/PLATELET
Basophils Absolute: 0 10*3/uL (ref 0.0–0.1)
Basophils Relative: 0.5 % (ref 0.0–3.0)
Eosinophils Absolute: 0.3 10*3/uL (ref 0.0–0.7)
Eosinophils Relative: 3.4 % (ref 0.0–5.0)
HCT: 42.7 % (ref 39.0–52.0)
Hemoglobin: 14.3 g/dL (ref 13.0–17.0)
Lymphocytes Relative: 22.3 % (ref 12.0–46.0)
Lymphs Abs: 2.1 10*3/uL (ref 0.7–4.0)
MCHC: 33.6 g/dL (ref 30.0–36.0)
MCV: 88.6 fl (ref 78.0–100.0)
Monocytes Absolute: 1 10*3/uL (ref 0.1–1.0)
Monocytes Relative: 10.6 % (ref 3.0–12.0)
Neutro Abs: 5.9 10*3/uL (ref 1.4–7.7)
Neutrophils Relative %: 63.2 % (ref 43.0–77.0)
Platelets: 245 10*3/uL (ref 150.0–400.0)
RBC: 4.82 Mil/uL (ref 4.22–5.81)
RDW: 14.4 % (ref 11.5–15.5)
WBC: 9.3 10*3/uL (ref 4.0–10.5)

## 2022-04-23 LAB — URINALYSIS, ROUTINE W REFLEX MICROSCOPIC
Bilirubin Urine: NEGATIVE
Hgb urine dipstick: NEGATIVE
Ketones, ur: NEGATIVE
Leukocytes,Ua: NEGATIVE
Nitrite: NEGATIVE
RBC / HPF: NONE SEEN (ref 0–?)
Specific Gravity, Urine: 1.02 (ref 1.000–1.030)
Total Protein, Urine: NEGATIVE
Urine Glucose: NEGATIVE
Urobilinogen, UA: 1 (ref 0.0–1.0)
pH: 5.5 (ref 5.0–8.0)

## 2022-04-23 LAB — COMPREHENSIVE METABOLIC PANEL
ALT: 19 U/L (ref 0–53)
AST: 19 U/L (ref 0–37)
Albumin: 4.1 g/dL (ref 3.5–5.2)
Alkaline Phosphatase: 62 U/L (ref 39–117)
BUN: 24 mg/dL — ABNORMAL HIGH (ref 6–23)
CO2: 28 mEq/L (ref 19–32)
Calcium: 9.9 mg/dL (ref 8.4–10.5)
Chloride: 103 mEq/L (ref 96–112)
Creatinine, Ser: 1.2 mg/dL (ref 0.40–1.50)
GFR: 54.61 mL/min — ABNORMAL LOW (ref >60.00)
Glucose, Bld: 139 mg/dL — ABNORMAL HIGH (ref 70–99)
Potassium: 4.3 mEq/L (ref 3.5–5.1)
Sodium: 139 mEq/L (ref 135–145)
Total Bilirubin: 0.8 mg/dL (ref 0.2–1.2)
Total Protein: 6.9 g/dL (ref 6.0–8.3)

## 2022-04-23 LAB — VITAMIN B12: Vitamin B-12: 371 pg/mL (ref 211–911)

## 2022-04-23 LAB — HEMOGLOBIN A1C: Hgb A1c MFr Bld: 7.1 % — ABNORMAL HIGH (ref 4.6–6.5)

## 2022-04-23 MED ORDER — AMLODIPINE BESYLATE 5 MG PO TABS: 5.0000 mg | ORAL_TABLET | Freq: Every day | ORAL | 0 refills | Status: DC

## 2022-04-23 MED ORDER — CYANOCOBALAMIN 1000 MCG/ML IJ SOLN
1000.0000 ug | Freq: Once | INTRAMUSCULAR | Status: AC
  Administered 2022-04-23: 1000 ug via INTRAMUSCULAR

## 2022-04-23 MED ORDER — DONEPEZIL HCL 10 MG PO TABS: ORAL_TABLET | ORAL | 0 refills | Status: DC

## 2022-04-23 MED ORDER — APIXABAN 5 MG PO TABS: 5.0000 mg | ORAL_TABLET | Freq: Two times a day (BID) | ORAL | 0 refills | Status: DC

## 2022-04-23 MED ORDER — METFORMIN HCL 500 MG PO TABS: 500.0000 mg | ORAL_TABLET | Freq: Two times a day (BID) | ORAL | 0 refills | Status: DC

## 2022-04-23 MED ORDER — ATORVASTATIN CALCIUM 80 MG PO TABS
80.0000 mg | ORAL_TABLET | Freq: Every day | ORAL | 0 refills | Status: DC
Start: 2022-04-23 — End: 2023-02-01

## 2022-04-23 NOTE — Progress Notes (Signed)
Leaking urine.  Not leaking stool.  No burning with urination.  Stream is still good.  No urgency.  No nocturia.  History of urinary retention years ago, required Foley catheter placement at that time.  Discussed most recent imaging regarding pulmonary nodule and also incidental tracer uptake in the prostate.  He is not interested in invasive procedures for either.  Given his age and clinical situation it may be counterproductive and detrimental to go through invasive procedures involving anesthesia.  He is moving to assisted living facility in the near future.  Discussed.  His breathing is clearly better on spiriva.    History of B12 deficiency on replacement.  Meds, vitals, and allergies reviewed.   ROS: Per HPI unless specifically indicated in ROS section   GEN: nad, alert and pleasant conversation HEENT: ncat NECK: supple w/o LA CV: rrr.   ABD: soft, +bs EXT: no edema SKIN: Well-perfused.

## 2022-04-23 NOTE — Patient Instructions (Signed)
Don't change your meds yet.   Go to the lab on the way out.   If you have mychart we'll likely use that to update you.    After you get labs done, then get a B12 shot today.   Consider prostate medicine after the move, assuming your labs today are reasonable.   Take care.  Glad to see you.

## 2022-04-26 NOTE — Assessment & Plan Note (Signed)
Continue replacement as is.  See notes on labs.

## 2022-04-26 NOTE — Assessment & Plan Note (Signed)
He is not interested in invasive treatment and I think that makes sense.

## 2022-04-26 NOTE — Assessment & Plan Note (Signed)
Discussed most recent imaging regarding pulmonary nodule and also incidental tracer uptake in the prostate.  He is not interested in invasive procedures for either.  Given his age and clinical situation it may be counterproductive and detrimental to go through invasive procedures involving anesthesia.  I think it makes sense to check his labs to make sure there is not an obvious issue identified.  It may be reasonable to consider medication such as Flomax after he moves and is set up at his assisted living facility but I am not enthused about changing his medication prior to checking his labs and prior to the move.  Patient and son agree.  With his pending move to the assisted living facility, it is worth mentioning that I have always enjoyed seeing this gentleman.  If I do not get to see him here in clinic again I will miss him.  He has always been kind to me.  I told him and his son that.

## 2022-04-28 ENCOUNTER — Other Ambulatory Visit: Payer: Self-pay | Admitting: Family Medicine

## 2022-05-04 ENCOUNTER — Telehealth: Payer: Self-pay | Admitting: Family Medicine

## 2022-05-04 DIAGNOSIS — Z111 Encounter for screening for respiratory tuberculosis: Secondary | ICD-10-CM

## 2022-05-04 NOTE — Telephone Encounter (Signed)
Patient son called and stated patient needs a TB test done because he is going into an assisted living facility. Call back number [phone].

## 2022-05-04 NOTE — Telephone Encounter (Signed)
Spoke with patients son and he is requesting a blood test be done

## 2022-05-05 ENCOUNTER — Other Ambulatory Visit (INDEPENDENT_AMBULATORY_CARE_PROVIDER_SITE_OTHER): Payer: Medicare Other

## 2022-05-05 ENCOUNTER — Encounter: Payer: Self-pay | Admitting: Family Medicine

## 2022-05-05 DIAGNOSIS — Z111 Encounter for screening for respiratory tuberculosis: Secondary | ICD-10-CM

## 2022-05-05 NOTE — Telephone Encounter (Signed)
Ordered.  Needs labs visit.  Thanks.

## 2022-05-05 NOTE — Telephone Encounter (Signed)
See mychart messages

## 2022-05-05 NOTE — Addendum Note (Signed)
Addended by: Tonia Ghent on: 05/05/2022 07:05 AM   Modules accepted: Orders

## 2022-05-09 LAB — QUANTIFERON-TB GOLD PLUS
Mitogen-NIL: 6.43 IU/mL
NIL: 0.02 IU/mL
QuantiFERON-TB Gold Plus: NEGATIVE
TB1-NIL: 0 IU/mL
TB2-NIL: 0 IU/mL

## 2022-05-14 DIAGNOSIS — D381 Neoplasm of uncertain behavior of trachea, bronchus and lung: Secondary | ICD-10-CM | POA: Diagnosis not present

## 2022-05-14 DIAGNOSIS — E119 Type 2 diabetes mellitus without complications: Secondary | ICD-10-CM | POA: Diagnosis not present

## 2022-05-14 DIAGNOSIS — I679 Cerebrovascular disease, unspecified: Secondary | ICD-10-CM | POA: Diagnosis not present

## 2022-05-14 DIAGNOSIS — E559 Vitamin D deficiency, unspecified: Secondary | ICD-10-CM | POA: Diagnosis not present

## 2022-05-14 DIAGNOSIS — J449 Chronic obstructive pulmonary disease, unspecified: Secondary | ICD-10-CM | POA: Diagnosis not present

## 2022-05-14 DIAGNOSIS — E785 Hyperlipidemia, unspecified: Secondary | ICD-10-CM | POA: Diagnosis not present

## 2022-05-14 DIAGNOSIS — I10 Essential (primary) hypertension: Secondary | ICD-10-CM | POA: Diagnosis not present

## 2022-05-14 DIAGNOSIS — D519 Vitamin B12 deficiency anemia, unspecified: Secondary | ICD-10-CM | POA: Diagnosis not present

## 2022-05-20 DIAGNOSIS — E559 Vitamin D deficiency, unspecified: Secondary | ICD-10-CM | POA: Diagnosis not present

## 2022-05-20 DIAGNOSIS — D519 Vitamin B12 deficiency anemia, unspecified: Secondary | ICD-10-CM | POA: Diagnosis not present

## 2022-05-20 DIAGNOSIS — E119 Type 2 diabetes mellitus without complications: Secondary | ICD-10-CM | POA: Diagnosis not present

## 2022-05-20 DIAGNOSIS — E785 Hyperlipidemia, unspecified: Secondary | ICD-10-CM | POA: Diagnosis not present

## 2022-05-20 DIAGNOSIS — I10 Essential (primary) hypertension: Secondary | ICD-10-CM | POA: Diagnosis not present

## 2022-05-28 DIAGNOSIS — I10 Essential (primary) hypertension: Secondary | ICD-10-CM | POA: Diagnosis not present

## 2022-05-28 DIAGNOSIS — E119 Type 2 diabetes mellitus without complications: Secondary | ICD-10-CM | POA: Diagnosis not present

## 2022-05-28 DIAGNOSIS — E785 Hyperlipidemia, unspecified: Secondary | ICD-10-CM | POA: Diagnosis not present

## 2022-05-28 DIAGNOSIS — E559 Vitamin D deficiency, unspecified: Secondary | ICD-10-CM | POA: Diagnosis not present

## 2022-05-28 DIAGNOSIS — D519 Vitamin B12 deficiency anemia, unspecified: Secondary | ICD-10-CM | POA: Diagnosis not present

## 2022-05-28 DIAGNOSIS — Z0000 Encounter for general adult medical examination without abnormal findings: Secondary | ICD-10-CM | POA: Diagnosis not present

## 2022-08-06 DIAGNOSIS — R059 Cough, unspecified: Secondary | ICD-10-CM | POA: Diagnosis not present

## 2022-08-06 DIAGNOSIS — J209 Acute bronchitis, unspecified: Secondary | ICD-10-CM | POA: Diagnosis not present

## 2022-08-06 DIAGNOSIS — J811 Chronic pulmonary edema: Secondary | ICD-10-CM | POA: Diagnosis not present

## 2022-08-13 DIAGNOSIS — I10 Essential (primary) hypertension: Secondary | ICD-10-CM | POA: Diagnosis not present

## 2022-08-13 DIAGNOSIS — E559 Vitamin D deficiency, unspecified: Secondary | ICD-10-CM | POA: Diagnosis not present

## 2022-08-13 DIAGNOSIS — D519 Vitamin B12 deficiency anemia, unspecified: Secondary | ICD-10-CM | POA: Diagnosis not present

## 2022-08-13 DIAGNOSIS — M545 Low back pain, unspecified: Secondary | ICD-10-CM | POA: Diagnosis not present

## 2022-10-08 ENCOUNTER — Telehealth: Payer: Self-pay | Admitting: Family Medicine

## 2022-10-08 NOTE — Telephone Encounter (Signed)
Contacted [facility] to schedule their annual wellness visit. Patient declined to schedule AWV at this time. Patient is in a assistant living facility, and have taken over patients care.  [name] Direct Dial: [phone]

## 2022-10-20 DIAGNOSIS — M2022 Hallux rigidus, left foot: Secondary | ICD-10-CM | POA: Diagnosis not present

## 2022-10-20 DIAGNOSIS — M79674 Pain in right toe(s): Secondary | ICD-10-CM | POA: Diagnosis not present

## 2022-10-20 DIAGNOSIS — B351 Tinea unguium: Secondary | ICD-10-CM | POA: Diagnosis not present

## 2022-10-20 DIAGNOSIS — E1151 Type 2 diabetes mellitus with diabetic peripheral angiopathy without gangrene: Secondary | ICD-10-CM | POA: Diagnosis not present

## 2022-10-20 DIAGNOSIS — M79675 Pain in left toe(s): Secondary | ICD-10-CM | POA: Diagnosis not present

## 2022-11-04 DIAGNOSIS — R350 Frequency of micturition: Secondary | ICD-10-CM | POA: Diagnosis not present

## 2022-11-04 DIAGNOSIS — R300 Dysuria: Secondary | ICD-10-CM | POA: Diagnosis not present

## 2022-11-05 DIAGNOSIS — M545 Low back pain, unspecified: Secondary | ICD-10-CM | POA: Diagnosis not present

## 2022-11-09 DIAGNOSIS — C7971 Secondary malignant neoplasm of right adrenal gland: Secondary | ICD-10-CM | POA: Diagnosis not present

## 2022-11-09 DIAGNOSIS — D649 Anemia, unspecified: Secondary | ICD-10-CM | POA: Diagnosis not present

## 2022-11-09 DIAGNOSIS — Z791 Long term (current) use of non-steroidal anti-inflammatories (NSAID): Secondary | ICD-10-CM | POA: Diagnosis not present

## 2022-11-09 DIAGNOSIS — Z79899 Other long term (current) drug therapy: Secondary | ICD-10-CM | POA: Diagnosis not present

## 2022-11-09 DIAGNOSIS — S79911A Unspecified injury of right hip, initial encounter: Secondary | ICD-10-CM | POA: Diagnosis not present

## 2022-11-09 DIAGNOSIS — C801 Malignant (primary) neoplasm, unspecified: Secondary | ICD-10-CM | POA: Diagnosis not present

## 2022-11-09 DIAGNOSIS — Z7401 Bed confinement status: Secondary | ICD-10-CM | POA: Diagnosis not present

## 2022-11-09 DIAGNOSIS — Z888 Allergy status to other drugs, medicaments and biological substances status: Secondary | ICD-10-CM | POA: Diagnosis not present

## 2022-11-09 DIAGNOSIS — W19XXXA Unspecified fall, initial encounter: Secondary | ICD-10-CM | POA: Diagnosis not present

## 2022-11-09 DIAGNOSIS — M545 Low back pain, unspecified: Secondary | ICD-10-CM | POA: Diagnosis not present

## 2022-11-09 DIAGNOSIS — J432 Centrilobular emphysema: Secondary | ICD-10-CM | POA: Diagnosis not present

## 2022-11-09 DIAGNOSIS — K5903 Drug induced constipation: Secondary | ICD-10-CM | POA: Diagnosis not present

## 2022-11-09 DIAGNOSIS — T402X5A Adverse effect of other opioids, initial encounter: Secondary | ICD-10-CM | POA: Diagnosis not present

## 2022-11-09 DIAGNOSIS — M47816 Spondylosis without myelopathy or radiculopathy, lumbar region: Secondary | ICD-10-CM | POA: Diagnosis not present

## 2022-11-09 DIAGNOSIS — M4804 Spinal stenosis, thoracic region: Secondary | ICD-10-CM | POA: Diagnosis not present

## 2022-11-09 DIAGNOSIS — Z7189 Other specified counseling: Secondary | ICD-10-CM | POA: Diagnosis not present

## 2022-11-09 DIAGNOSIS — S3992XA Unspecified injury of lower back, initial encounter: Secondary | ICD-10-CM | POA: Diagnosis not present

## 2022-11-09 DIAGNOSIS — E119 Type 2 diabetes mellitus without complications: Secondary | ICD-10-CM | POA: Diagnosis not present

## 2022-11-09 DIAGNOSIS — I714 Abdominal aortic aneurysm, without rupture, unspecified: Secondary | ICD-10-CM | POA: Diagnosis not present

## 2022-11-09 DIAGNOSIS — Y998 Other external cause status: Secondary | ICD-10-CM | POA: Diagnosis not present

## 2022-11-09 DIAGNOSIS — Y92129 Unspecified place in nursing home as the place of occurrence of the external cause: Secondary | ICD-10-CM | POA: Diagnosis not present

## 2022-11-09 DIAGNOSIS — M8448XA Pathological fracture, other site, initial encounter for fracture: Secondary | ICD-10-CM | POA: Diagnosis not present

## 2022-11-09 DIAGNOSIS — C9000 Multiple myeloma not having achieved remission: Secondary | ICD-10-CM | POA: Diagnosis not present

## 2022-11-09 DIAGNOSIS — C7951 Secondary malignant neoplasm of bone: Secondary | ICD-10-CM | POA: Diagnosis not present

## 2022-11-09 DIAGNOSIS — Y9389 Activity, other specified: Secondary | ICD-10-CM | POA: Diagnosis not present

## 2022-11-09 DIAGNOSIS — E278 Other specified disorders of adrenal gland: Secondary | ICD-10-CM | POA: Diagnosis not present

## 2022-11-09 DIAGNOSIS — S32010A Wedge compression fracture of first lumbar vertebra, initial encounter for closed fracture: Secondary | ICD-10-CM | POA: Diagnosis not present

## 2022-11-09 DIAGNOSIS — G893 Neoplasm related pain (acute) (chronic): Secondary | ICD-10-CM | POA: Diagnosis not present

## 2022-11-09 DIAGNOSIS — R918 Other nonspecific abnormal finding of lung field: Secondary | ICD-10-CM | POA: Diagnosis not present

## 2022-11-09 DIAGNOSIS — J449 Chronic obstructive pulmonary disease, unspecified: Secondary | ICD-10-CM | POA: Diagnosis not present

## 2022-11-09 DIAGNOSIS — Z743 Need for continuous supervision: Secondary | ICD-10-CM | POA: Diagnosis not present

## 2022-11-09 DIAGNOSIS — C3490 Malignant neoplasm of unspecified part of unspecified bronchus or lung: Secondary | ICD-10-CM | POA: Diagnosis not present

## 2022-11-09 DIAGNOSIS — W010XXA Fall on same level from slipping, tripping and stumbling without subsequent striking against object, initial encounter: Secondary | ICD-10-CM | POA: Diagnosis not present

## 2022-11-09 DIAGNOSIS — Z7409 Other reduced mobility: Secondary | ICD-10-CM | POA: Diagnosis not present

## 2022-11-09 DIAGNOSIS — M899 Disorder of bone, unspecified: Secondary | ICD-10-CM | POA: Diagnosis not present

## 2022-11-09 DIAGNOSIS — Z66 Do not resuscitate: Secondary | ICD-10-CM | POA: Diagnosis not present

## 2022-11-09 DIAGNOSIS — M25561 Pain in right knee: Secondary | ICD-10-CM | POA: Diagnosis not present

## 2022-11-09 DIAGNOSIS — K529 Noninfective gastroenteritis and colitis, unspecified: Secondary | ICD-10-CM | POA: Diagnosis not present

## 2022-11-09 DIAGNOSIS — I10 Essential (primary) hypertension: Secondary | ICD-10-CM | POA: Diagnosis not present

## 2022-11-09 DIAGNOSIS — S8991XA Unspecified injury of right lower leg, initial encounter: Secondary | ICD-10-CM | POA: Diagnosis not present

## 2022-11-09 DIAGNOSIS — Z515 Encounter for palliative care: Secondary | ICD-10-CM | POA: Diagnosis not present

## 2022-11-09 DIAGNOSIS — S79912A Unspecified injury of left hip, initial encounter: Secondary | ICD-10-CM | POA: Diagnosis not present

## 2022-11-09 DIAGNOSIS — Z7901 Long term (current) use of anticoagulants: Secondary | ICD-10-CM | POA: Diagnosis not present

## 2022-11-09 DIAGNOSIS — M8458XA Pathological fracture in neoplastic disease, other specified site, initial encounter for fracture: Secondary | ICD-10-CM | POA: Diagnosis not present

## 2022-11-09 DIAGNOSIS — I7141 Pararenal abdominal aortic aneurysm, without rupture: Secondary | ICD-10-CM | POA: Diagnosis not present

## 2022-11-09 DIAGNOSIS — Z7984 Long term (current) use of oral hypoglycemic drugs: Secondary | ICD-10-CM | POA: Diagnosis not present

## 2022-11-10 DIAGNOSIS — Z7189 Other specified counseling: Secondary | ICD-10-CM | POA: Diagnosis not present

## 2022-11-10 DIAGNOSIS — S32010A Wedge compression fracture of first lumbar vertebra, initial encounter for closed fracture: Secondary | ICD-10-CM | POA: Diagnosis not present

## 2022-11-10 DIAGNOSIS — R918 Other nonspecific abnormal finding of lung field: Secondary | ICD-10-CM | POA: Diagnosis not present

## 2022-11-10 DIAGNOSIS — K529 Noninfective gastroenteritis and colitis, unspecified: Secondary | ICD-10-CM | POA: Diagnosis not present

## 2022-11-10 DIAGNOSIS — E119 Type 2 diabetes mellitus without complications: Secondary | ICD-10-CM | POA: Diagnosis not present

## 2022-11-10 DIAGNOSIS — I10 Essential (primary) hypertension: Secondary | ICD-10-CM | POA: Diagnosis not present

## 2022-11-10 DIAGNOSIS — M899 Disorder of bone, unspecified: Secondary | ICD-10-CM | POA: Diagnosis not present

## 2022-11-10 DIAGNOSIS — I7141 Pararenal abdominal aortic aneurysm, without rupture: Secondary | ICD-10-CM | POA: Diagnosis not present

## 2022-11-10 DIAGNOSIS — J432 Centrilobular emphysema: Secondary | ICD-10-CM | POA: Diagnosis not present

## 2022-11-11 DIAGNOSIS — M8458XA Pathological fracture in neoplastic disease, other specified site, initial encounter for fracture: Secondary | ICD-10-CM | POA: Diagnosis not present

## 2022-11-11 DIAGNOSIS — S32010A Wedge compression fracture of first lumbar vertebra, initial encounter for closed fracture: Secondary | ICD-10-CM | POA: Diagnosis not present

## 2022-11-11 DIAGNOSIS — M8448XA Pathological fracture, other site, initial encounter for fracture: Secondary | ICD-10-CM | POA: Diagnosis not present

## 2022-11-12 DIAGNOSIS — G893 Neoplasm related pain (acute) (chronic): Secondary | ICD-10-CM | POA: Diagnosis not present

## 2022-11-12 DIAGNOSIS — Z515 Encounter for palliative care: Secondary | ICD-10-CM | POA: Diagnosis not present

## 2022-11-12 DIAGNOSIS — S32010A Wedge compression fracture of first lumbar vertebra, initial encounter for closed fracture: Secondary | ICD-10-CM | POA: Diagnosis not present

## 2022-11-12 DIAGNOSIS — C7951 Secondary malignant neoplasm of bone: Secondary | ICD-10-CM | POA: Diagnosis not present

## 2022-11-13 ENCOUNTER — Encounter (INDEPENDENT_AMBULATORY_CARE_PROVIDER_SITE_OTHER): Payer: Medicare Other | Admitting: Ophthalmology

## 2022-11-13 DIAGNOSIS — S32010A Wedge compression fracture of first lumbar vertebra, initial encounter for closed fracture: Secondary | ICD-10-CM | POA: Diagnosis not present

## 2022-11-14 DIAGNOSIS — S8991XA Unspecified injury of right lower leg, initial encounter: Secondary | ICD-10-CM | POA: Diagnosis not present

## 2022-11-14 DIAGNOSIS — S79911A Unspecified injury of right hip, initial encounter: Secondary | ICD-10-CM | POA: Diagnosis not present

## 2022-11-14 DIAGNOSIS — S32010A Wedge compression fracture of first lumbar vertebra, initial encounter for closed fracture: Secondary | ICD-10-CM | POA: Diagnosis not present

## 2022-11-14 DIAGNOSIS — S79912A Unspecified injury of left hip, initial encounter: Secondary | ICD-10-CM | POA: Diagnosis not present

## 2022-11-15 DIAGNOSIS — S32010A Wedge compression fracture of first lumbar vertebra, initial encounter for closed fracture: Secondary | ICD-10-CM | POA: Diagnosis not present

## 2022-11-16 DIAGNOSIS — S32010A Wedge compression fracture of first lumbar vertebra, initial encounter for closed fracture: Secondary | ICD-10-CM | POA: Diagnosis not present

## 2022-12-19 DEATH — deceased

## 2023-01-28 ENCOUNTER — Encounter: Payer: Self-pay | Admitting: Family Medicine

## 2023-01-31 NOTE — Telephone Encounter (Signed)
Please update this patient's chart, he passed away on 12-24-22.  Thanks.

## 2023-02-01 NOTE — Telephone Encounter (Signed)
Updated chart no further action needed at this time.
